# Patient Record
Sex: Female | Born: 1960
Health system: Southern US, Community
[De-identification: ages and names within clinical notes are randomized; demographics above are authoritative.]

## PROBLEM LIST (undated history)

## (undated) DIAGNOSIS — Z9289 Personal history of other medical treatment: Secondary | ICD-10-CM

## (undated) DIAGNOSIS — Z202 Contact with and (suspected) exposure to infections with a predominantly sexual mode of transmission: Secondary | ICD-10-CM

## (undated) DIAGNOSIS — J101 Influenza due to other identified influenza virus with other respiratory manifestations: Secondary | ICD-10-CM

## (undated) DIAGNOSIS — Z87442 Personal history of urinary calculi: Secondary | ICD-10-CM

## (undated) DIAGNOSIS — I10 Essential (primary) hypertension: Secondary | ICD-10-CM

## (undated) DIAGNOSIS — M25532 Pain in left wrist: Secondary | ICD-10-CM

## (undated) DIAGNOSIS — C801 Malignant (primary) neoplasm, unspecified: Secondary | ICD-10-CM

## (undated) DIAGNOSIS — F419 Anxiety disorder, unspecified: Secondary | ICD-10-CM

## (undated) DIAGNOSIS — F32A Depression, unspecified: Secondary | ICD-10-CM

## (undated) DIAGNOSIS — K219 Gastro-esophageal reflux disease without esophagitis: Secondary | ICD-10-CM

## (undated) DIAGNOSIS — G4730 Sleep apnea, unspecified: Secondary | ICD-10-CM

## (undated) DIAGNOSIS — F329 Major depressive disorder, single episode, unspecified: Secondary | ICD-10-CM

## (undated) DIAGNOSIS — R000 Tachycardia, unspecified: Secondary | ICD-10-CM

## (undated) DIAGNOSIS — R05 Cough: Secondary | ICD-10-CM

## (undated) DIAGNOSIS — R079 Chest pain, unspecified: Secondary | ICD-10-CM

## (undated) DIAGNOSIS — M797 Fibromyalgia: Secondary | ICD-10-CM

## (undated) DIAGNOSIS — I209 Angina pectoris, unspecified: Secondary | ICD-10-CM

## (undated) DIAGNOSIS — R51 Headache: Secondary | ICD-10-CM

## (undated) DIAGNOSIS — G8929 Other chronic pain: Secondary | ICD-10-CM

## (undated) HISTORY — DX: Anxiety disorder, unspecified: F41.9

## (undated) HISTORY — PX: TOTAL ABDOMINAL HYSTERECTOMY: SHX209

## (undated) HISTORY — PX: CHOLECYSTECTOMY: SHX55

## (undated) HISTORY — DX: Gastro-esophageal reflux disease without esophagitis: K21.9

## (undated) HISTORY — DX: Sleep apnea, unspecified: G47.30

## (undated) HISTORY — DX: Influenza due to other identified influenza virus with other respiratory manifestations: J10.1

## (undated) HISTORY — DX: Chest pain, unspecified: R07.9

## (undated) HISTORY — DX: Headache: R51

## (undated) HISTORY — DX: Other chronic pain: G89.29

## (undated) HISTORY — DX: Pain in left wrist: M25.532

## (undated) HISTORY — DX: Tachycardia, unspecified: R00.0

## (undated) HISTORY — DX: Cough: R05

## (undated) HISTORY — PX: WISDOM TOOTH EXTRACTION: SHX21

---
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
## 1998-10-02 ENCOUNTER — Emergency Department (HOSPITAL_COMMUNITY): Admission: EM | Admit: 1998-10-02 | Discharge: 1998-10-02 | Payer: Self-pay | Admitting: Emergency Medicine

## 1998-10-27 ENCOUNTER — Emergency Department (HOSPITAL_COMMUNITY): Admission: EM | Admit: 1998-10-27 | Discharge: 1998-10-27 | Payer: Self-pay | Admitting: Emergency Medicine

## 1999-04-08 ENCOUNTER — Emergency Department (HOSPITAL_COMMUNITY): Admission: EM | Admit: 1999-04-08 | Discharge: 1999-04-08 | Payer: Self-pay | Admitting: Emergency Medicine

## 1999-04-28 ENCOUNTER — Emergency Department (HOSPITAL_COMMUNITY): Admission: EM | Admit: 1999-04-28 | Discharge: 1999-04-28 | Payer: Self-pay | Admitting: Emergency Medicine

## 2000-02-11 ENCOUNTER — Emergency Department (HOSPITAL_COMMUNITY): Admission: EM | Admit: 2000-02-11 | Discharge: 2000-02-11 | Payer: Self-pay | Admitting: Emergency Medicine

## 2000-06-19 ENCOUNTER — Emergency Department (HOSPITAL_COMMUNITY): Admission: EM | Admit: 2000-06-19 | Discharge: 2000-06-19 | Payer: Self-pay | Admitting: Emergency Medicine

## 2000-08-08 ENCOUNTER — Emergency Department (HOSPITAL_COMMUNITY): Admission: EM | Admit: 2000-08-08 | Discharge: 2000-08-09 | Payer: Self-pay | Admitting: Emergency Medicine

## 2001-02-16 ENCOUNTER — Emergency Department (HOSPITAL_COMMUNITY): Admission: EM | Admit: 2001-02-16 | Discharge: 2001-02-16 | Payer: Self-pay | Admitting: *Deleted

## 2002-02-13 ENCOUNTER — Emergency Department (HOSPITAL_COMMUNITY): Admission: EM | Admit: 2002-02-13 | Discharge: 2002-02-13 | Payer: Self-pay | Admitting: Emergency Medicine

## 2002-04-17 ENCOUNTER — Emergency Department (HOSPITAL_COMMUNITY): Admission: EM | Admit: 2002-04-17 | Discharge: 2002-04-17 | Payer: Self-pay | Admitting: *Deleted

## 2002-04-27 ENCOUNTER — Emergency Department (HOSPITAL_COMMUNITY): Admission: EM | Admit: 2002-04-27 | Discharge: 2002-04-27 | Payer: Self-pay | Admitting: Emergency Medicine

## 2003-05-29 ENCOUNTER — Emergency Department (HOSPITAL_COMMUNITY): Admission: EM | Admit: 2003-05-29 | Discharge: 2003-05-30 | Payer: Self-pay | Admitting: Emergency Medicine

## 2003-12-07 ENCOUNTER — Emergency Department (HOSPITAL_COMMUNITY): Admission: EM | Admit: 2003-12-07 | Discharge: 2003-12-07 | Payer: Self-pay | Admitting: Emergency Medicine

## 2003-12-10 ENCOUNTER — Emergency Department (HOSPITAL_COMMUNITY): Admission: EM | Admit: 2003-12-10 | Discharge: 2003-12-10 | Payer: Self-pay | Admitting: Family Medicine

## 2004-06-03 ENCOUNTER — Emergency Department (HOSPITAL_COMMUNITY): Admission: EM | Admit: 2004-06-03 | Discharge: 2004-06-04 | Payer: Self-pay | Admitting: Emergency Medicine

## 2004-06-08 ENCOUNTER — Ambulatory Visit (HOSPITAL_BASED_OUTPATIENT_CLINIC_OR_DEPARTMENT_OTHER): Admission: RE | Admit: 2004-06-08 | Discharge: 2004-06-08 | Payer: Self-pay | Admitting: Internal Medicine

## 2004-06-10 ENCOUNTER — Ambulatory Visit: Payer: Self-pay | Admitting: Internal Medicine

## 2004-06-11 ENCOUNTER — Ambulatory Visit: Payer: Self-pay

## 2004-06-17 ENCOUNTER — Ambulatory Visit: Payer: Self-pay | Admitting: Pulmonary Disease

## 2004-06-18 ENCOUNTER — Ambulatory Visit: Payer: Self-pay | Admitting: Internal Medicine

## 2004-06-19 ENCOUNTER — Ambulatory Visit: Payer: Self-pay | Admitting: Internal Medicine

## 2004-07-29 ENCOUNTER — Ambulatory Visit: Payer: Self-pay | Admitting: Pulmonary Disease

## 2004-08-03 ENCOUNTER — Encounter: Admission: RE | Admit: 2004-08-03 | Discharge: 2004-08-03 | Payer: Self-pay | Admitting: Internal Medicine

## 2004-08-22 ENCOUNTER — Ambulatory Visit: Payer: Self-pay | Admitting: Internal Medicine

## 2004-08-26 ENCOUNTER — Ambulatory Visit: Payer: Self-pay | Admitting: Internal Medicine

## 2004-08-27 ENCOUNTER — Ambulatory Visit: Payer: Self-pay | Admitting: Internal Medicine

## 2004-08-28 ENCOUNTER — Encounter: Admission: RE | Admit: 2004-08-28 | Discharge: 2004-08-28 | Payer: Self-pay | Admitting: Internal Medicine

## 2004-09-02 ENCOUNTER — Ambulatory Visit (HOSPITAL_BASED_OUTPATIENT_CLINIC_OR_DEPARTMENT_OTHER): Admission: RE | Admit: 2004-09-02 | Discharge: 2004-09-02 | Payer: Self-pay | Admitting: Internal Medicine

## 2004-09-07 ENCOUNTER — Ambulatory Visit: Payer: Self-pay | Admitting: Internal Medicine

## 2004-09-15 ENCOUNTER — Ambulatory Visit: Payer: Self-pay | Admitting: Internal Medicine

## 2004-10-10 ENCOUNTER — Ambulatory Visit: Payer: Self-pay | Admitting: Internal Medicine

## 2004-12-22 ENCOUNTER — Ambulatory Visit: Payer: Self-pay | Admitting: Internal Medicine

## 2005-01-08 ENCOUNTER — Ambulatory Visit (HOSPITAL_COMMUNITY): Admission: RE | Admit: 2005-01-08 | Discharge: 2005-01-08 | Payer: Self-pay | Admitting: Obstetrics & Gynecology

## 2005-01-19 ENCOUNTER — Encounter: Admission: RE | Admit: 2005-01-19 | Discharge: 2005-01-19 | Payer: Self-pay | Admitting: Obstetrics & Gynecology

## 2005-03-02 ENCOUNTER — Emergency Department (HOSPITAL_COMMUNITY): Admission: EM | Admit: 2005-03-02 | Discharge: 2005-03-02 | Payer: Self-pay | Admitting: Emergency Medicine

## 2005-05-07 ENCOUNTER — Ambulatory Visit: Payer: Self-pay | Admitting: Internal Medicine

## 2005-05-08 ENCOUNTER — Encounter: Admission: RE | Admit: 2005-05-08 | Discharge: 2005-05-08 | Payer: Self-pay | Admitting: Internal Medicine

## 2005-05-20 ENCOUNTER — Ambulatory Visit: Payer: Self-pay | Admitting: Internal Medicine

## 2005-05-25 ENCOUNTER — Ambulatory Visit: Payer: Self-pay | Admitting: Internal Medicine

## 2005-06-02 ENCOUNTER — Encounter (INDEPENDENT_AMBULATORY_CARE_PROVIDER_SITE_OTHER): Payer: Self-pay | Admitting: *Deleted

## 2005-06-02 ENCOUNTER — Ambulatory Visit (HOSPITAL_COMMUNITY): Admission: RE | Admit: 2005-06-02 | Discharge: 2005-06-03 | Payer: Self-pay | Admitting: General Surgery

## 2005-07-09 ENCOUNTER — Ambulatory Visit: Payer: Self-pay | Admitting: Internal Medicine

## 2005-08-31 ENCOUNTER — Ambulatory Visit: Payer: Self-pay | Admitting: Internal Medicine

## 2005-11-05 ENCOUNTER — Ambulatory Visit: Payer: Self-pay | Admitting: Internal Medicine

## 2006-02-03 ENCOUNTER — Ambulatory Visit: Payer: Self-pay | Admitting: Internal Medicine

## 2006-03-28 ENCOUNTER — Emergency Department (HOSPITAL_COMMUNITY): Admission: EM | Admit: 2006-03-28 | Discharge: 2006-03-28 | Payer: Self-pay | Admitting: Emergency Medicine

## 2006-04-19 ENCOUNTER — Emergency Department (HOSPITAL_COMMUNITY): Admission: EM | Admit: 2006-04-19 | Discharge: 2006-04-19 | Payer: Self-pay | Admitting: Family Medicine

## 2006-05-29 ENCOUNTER — Emergency Department (HOSPITAL_COMMUNITY): Admission: EM | Admit: 2006-05-29 | Discharge: 2006-05-29 | Payer: Self-pay | Admitting: Emergency Medicine

## 2006-07-29 ENCOUNTER — Emergency Department (HOSPITAL_COMMUNITY): Admission: EM | Admit: 2006-07-29 | Discharge: 2006-07-29 | Payer: Self-pay | Admitting: Family Medicine

## 2006-11-09 ENCOUNTER — Ambulatory Visit (HOSPITAL_COMMUNITY): Admission: RE | Admit: 2006-11-09 | Discharge: 2006-11-09 | Payer: Self-pay | Admitting: Obstetrics & Gynecology

## 2006-11-18 ENCOUNTER — Emergency Department (HOSPITAL_COMMUNITY): Admission: EM | Admit: 2006-11-18 | Discharge: 2006-11-18 | Payer: Self-pay | Admitting: Emergency Medicine

## 2006-12-17 ENCOUNTER — Ambulatory Visit (HOSPITAL_COMMUNITY): Admission: RE | Admit: 2006-12-17 | Discharge: 2006-12-17 | Payer: Self-pay | Admitting: Obstetrics & Gynecology

## 2006-12-17 ENCOUNTER — Encounter: Payer: Self-pay | Admitting: Obstetrics & Gynecology

## 2006-12-19 ENCOUNTER — Inpatient Hospital Stay (HOSPITAL_COMMUNITY): Admission: AD | Admit: 2006-12-19 | Discharge: 2006-12-19 | Payer: Self-pay | Admitting: Obstetrics

## 2006-12-20 ENCOUNTER — Inpatient Hospital Stay (HOSPITAL_COMMUNITY): Admission: AD | Admit: 2006-12-20 | Discharge: 2006-12-20 | Payer: Self-pay | Admitting: Obstetrics & Gynecology

## 2007-01-02 ENCOUNTER — Emergency Department (HOSPITAL_COMMUNITY): Admission: EM | Admit: 2007-01-02 | Discharge: 2007-01-02 | Payer: Self-pay | Admitting: Emergency Medicine

## 2007-01-04 ENCOUNTER — Emergency Department (HOSPITAL_COMMUNITY): Admission: EM | Admit: 2007-01-04 | Discharge: 2007-01-04 | Payer: Self-pay | Admitting: Emergency Medicine

## 2007-01-05 DIAGNOSIS — K219 Gastro-esophageal reflux disease without esophagitis: Secondary | ICD-10-CM | POA: Insufficient documentation

## 2007-03-12 ENCOUNTER — Emergency Department (HOSPITAL_COMMUNITY): Admission: EM | Admit: 2007-03-12 | Discharge: 2007-03-12 | Payer: Self-pay | Admitting: Emergency Medicine

## 2007-05-07 ENCOUNTER — Emergency Department (HOSPITAL_COMMUNITY): Admission: EM | Admit: 2007-05-07 | Discharge: 2007-05-07 | Payer: Self-pay | Admitting: Emergency Medicine

## 2007-10-23 ENCOUNTER — Inpatient Hospital Stay (HOSPITAL_COMMUNITY): Admission: AD | Admit: 2007-10-23 | Discharge: 2007-10-24 | Payer: Self-pay | Admitting: Obstetrics and Gynecology

## 2007-11-01 ENCOUNTER — Emergency Department (HOSPITAL_COMMUNITY): Admission: EM | Admit: 2007-11-01 | Discharge: 2007-11-01 | Payer: Self-pay | Admitting: Emergency Medicine

## 2008-02-22 ENCOUNTER — Emergency Department (HOSPITAL_COMMUNITY): Admission: EM | Admit: 2008-02-22 | Discharge: 2008-02-22 | Payer: Self-pay | Admitting: Emergency Medicine

## 2008-04-04 ENCOUNTER — Emergency Department (HOSPITAL_COMMUNITY): Admission: EM | Admit: 2008-04-04 | Discharge: 2008-04-04 | Payer: Self-pay | Admitting: Emergency Medicine

## 2008-05-25 ENCOUNTER — Emergency Department (HOSPITAL_COMMUNITY): Admission: EM | Admit: 2008-05-25 | Discharge: 2008-05-25 | Payer: Self-pay | Admitting: Emergency Medicine

## 2008-12-09 ENCOUNTER — Emergency Department (HOSPITAL_COMMUNITY): Admission: EM | Admit: 2008-12-09 | Discharge: 2008-12-10 | Payer: Self-pay | Admitting: Emergency Medicine

## 2009-01-06 ENCOUNTER — Emergency Department (HOSPITAL_COMMUNITY): Admission: EM | Admit: 2009-01-06 | Discharge: 2009-01-06 | Payer: Self-pay | Admitting: Emergency Medicine

## 2009-01-08 ENCOUNTER — Emergency Department (HOSPITAL_COMMUNITY): Admission: EM | Admit: 2009-01-08 | Discharge: 2009-01-08 | Payer: Self-pay | Admitting: Emergency Medicine

## 2009-03-04 ENCOUNTER — Emergency Department (HOSPITAL_COMMUNITY): Admission: EM | Admit: 2009-03-04 | Discharge: 2009-03-04 | Payer: Self-pay | Admitting: Emergency Medicine

## 2009-03-18 ENCOUNTER — Emergency Department (HOSPITAL_COMMUNITY): Admission: EM | Admit: 2009-03-18 | Discharge: 2009-03-18 | Payer: Self-pay | Admitting: Emergency Medicine

## 2009-05-09 ENCOUNTER — Emergency Department (HOSPITAL_COMMUNITY): Admission: EM | Admit: 2009-05-09 | Discharge: 2009-05-10 | Payer: Self-pay | Admitting: Emergency Medicine

## 2009-05-13 IMAGING — CR DG TOE 5TH 2+V*L*
3 series · 3 of 3 positions shown · non-contrast
Comparison: None

CLINICAL DATA: Broken toe

LEFT TOE - 2+ VIEW

[t toes ap left]
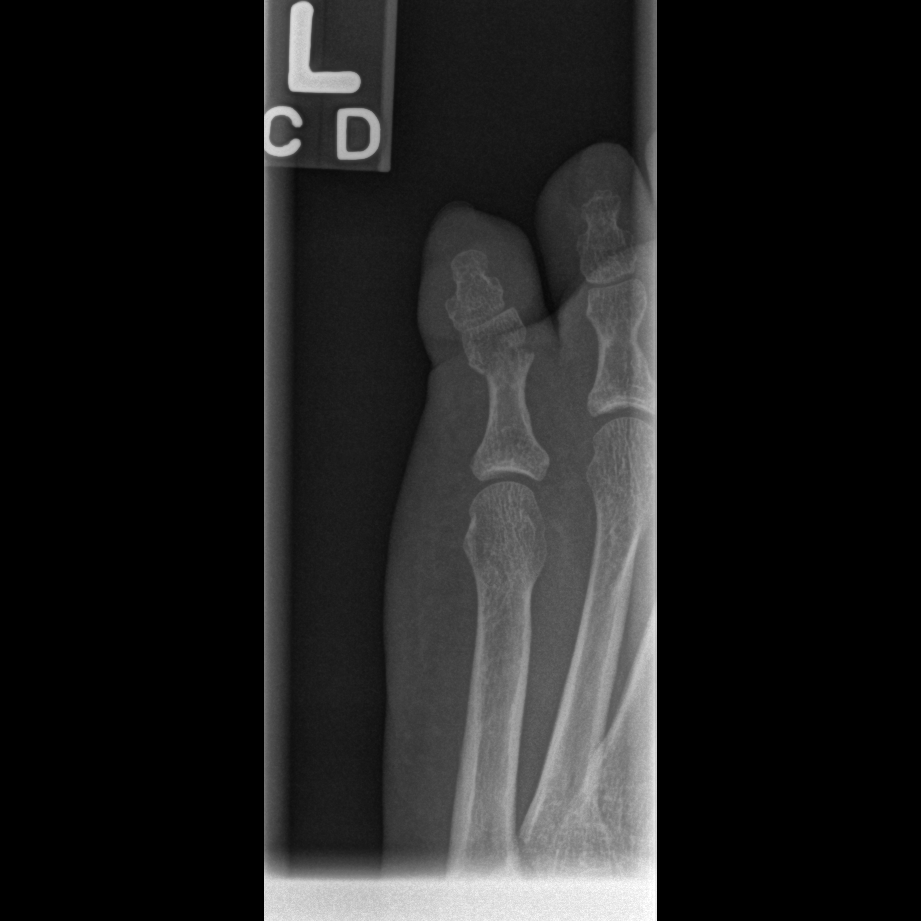

[t toes oblique left]
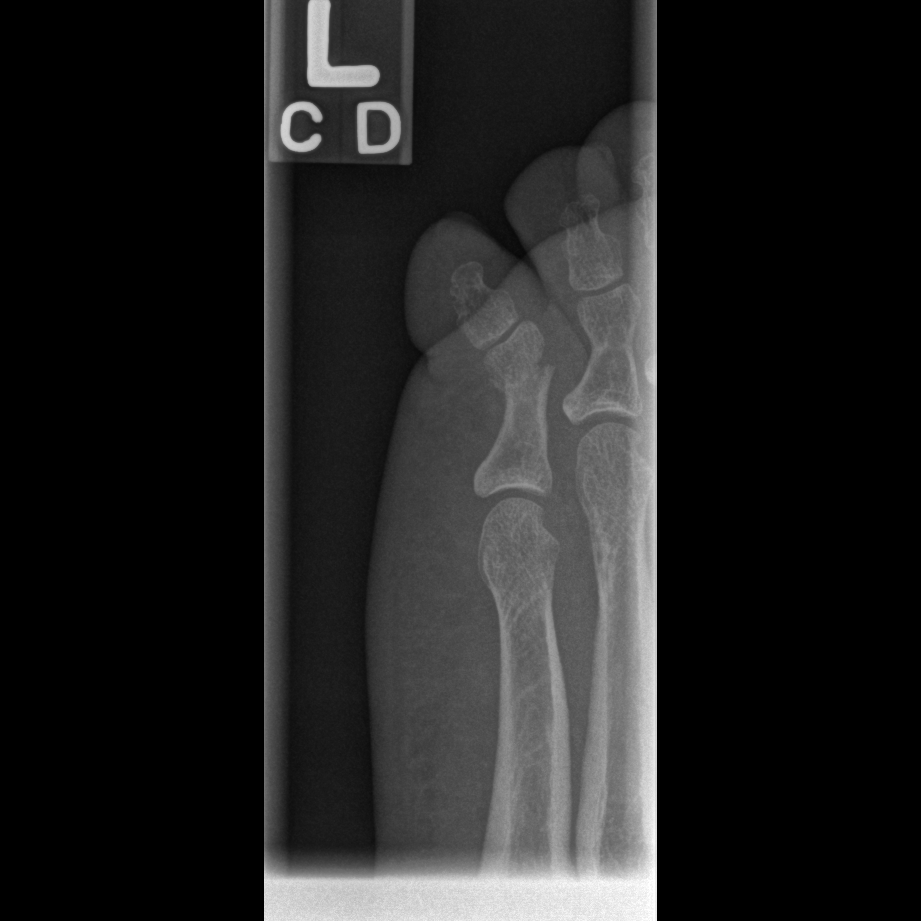

[t toes lateral left]
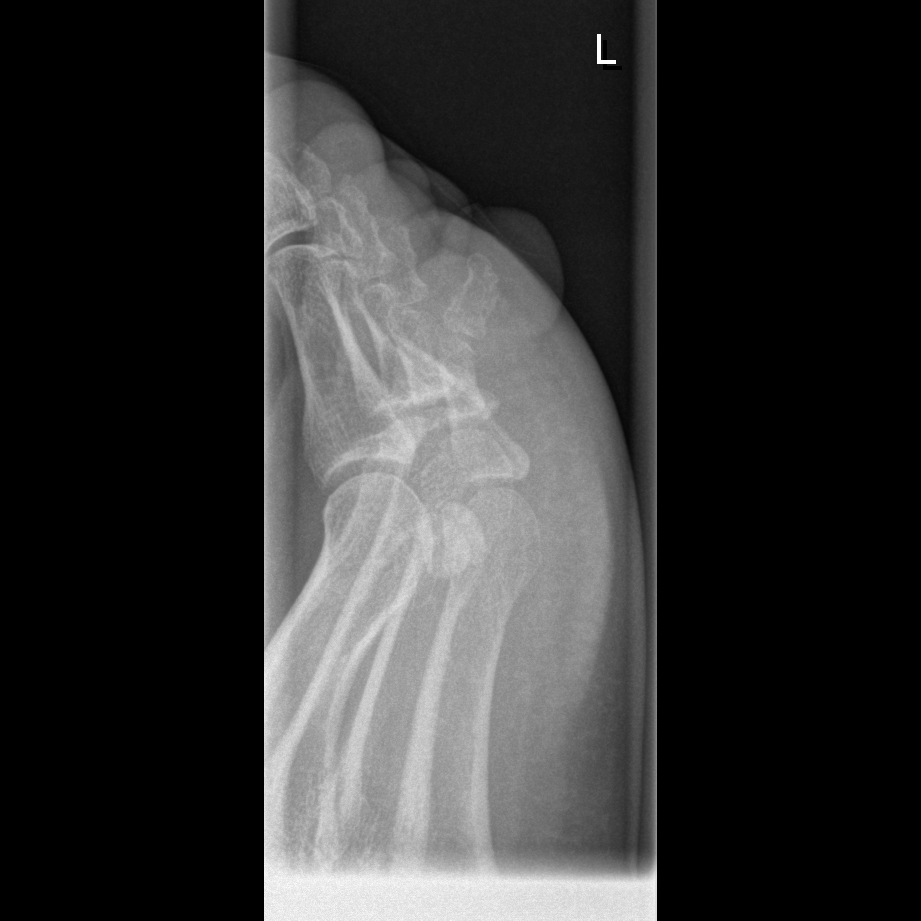

[3 of 3 positions shown; findings below may reference images not displayed]

FINDINGS: There is a transverse fracture through the distal aspect
of the the shaft of the fifth proximal phalanx.

There is lateral angulation of the distal fracture fragment.
IMPRESSION: 1.  Transverse fracture through the distal diaphysis of the fifth
proximal phalanx

## 2009-08-25 ENCOUNTER — Emergency Department (HOSPITAL_COMMUNITY): Admission: EM | Admit: 2009-08-25 | Discharge: 2009-08-25 | Payer: Self-pay | Admitting: Emergency Medicine

## 2010-01-19 ENCOUNTER — Emergency Department (HOSPITAL_COMMUNITY): Admission: EM | Admit: 2010-01-19 | Discharge: 2010-01-19 | Payer: Self-pay | Admitting: Emergency Medicine

## 2010-06-21 ENCOUNTER — Emergency Department (HOSPITAL_COMMUNITY)
Admission: EM | Admit: 2010-06-21 | Discharge: 2010-06-21 | Payer: Self-pay | Source: Home / Self Care | Admitting: Emergency Medicine

## 2010-06-28 ENCOUNTER — Encounter: Payer: Self-pay | Admitting: Internal Medicine

## 2010-06-29 ENCOUNTER — Encounter: Payer: Self-pay | Admitting: Obstetrics & Gynecology

## 2010-09-12 LAB — URINE CULTURE

## 2010-09-12 LAB — URINALYSIS, ROUTINE W REFLEX MICROSCOPIC
Glucose, UA: NEGATIVE mg/dL
Protein, ur: 30 mg/dL — AB

## 2010-09-12 LAB — URINE MICROSCOPIC-ADD ON

## 2010-09-14 LAB — DIFFERENTIAL
Basophils Absolute: 0 10*3/uL (ref 0.0–0.1)
Lymphocytes Relative: 37 % (ref 12–46)
Lymphs Abs: 2.6 10*3/uL (ref 0.7–4.0)
Neutro Abs: 3.4 10*3/uL (ref 1.7–7.7)
Neutrophils Relative %: 48 % (ref 43–77)

## 2010-09-14 LAB — LIPASE, BLOOD: Lipase: 17 U/L (ref 11–59)

## 2010-09-14 LAB — URINALYSIS, ROUTINE W REFLEX MICROSCOPIC
Glucose, UA: NEGATIVE mg/dL
Hgb urine dipstick: NEGATIVE
Protein, ur: NEGATIVE mg/dL
Specific Gravity, Urine: 1.041 — ABNORMAL HIGH (ref 1.005–1.030)

## 2010-09-14 LAB — COMPREHENSIVE METABOLIC PANEL
BUN: 8 mg/dL (ref 6–23)
CO2: 28 mEq/L (ref 19–32)
Chloride: 101 mEq/L (ref 96–112)
Creatinine, Ser: 0.85 mg/dL (ref 0.4–1.2)
GFR calc non Af Amer: >60 mL/min (ref >60)
Total Bilirubin: 0.7 mg/dL (ref 0.3–1.2)

## 2010-09-14 LAB — CBC
HCT: 36.4 % (ref 36.0–46.0)
MCV: 92.4 fL (ref 78.0–100.0)
RBC: 3.94 MIL/uL (ref 3.87–5.11)
WBC: 7.1 10*3/uL (ref 4.0–10.5)

## 2010-10-21 NOTE — H&P (Signed)
NAME:  Sherry [name], Sherry [name] NO.:  [id number]   MEDICAL RECORD NO.:  [id number]          PATIENT TYPE:  AMB   LOCATION:  SDC                           FACILITY:  [facility]   PHYSICIAN:  Roseanna Rainbow, M.D.DATE OF BIRTH:  1960-11-17   DATE OF ADMISSION:  DATE OF DISCHARGE:                              HISTORY & PHYSICAL   PREADMISSION HISTORY AND PHYSICAL   CHIEF COMPLAINT:  The patient is a 50 year old with vulvar cysts, who  presents for excision of the cysts.   HISTORY OF PRESENT ILLNESS:  Please see the above.   SOCIAL HISTORY:  Single, denies illicit drug use, has no significant  smoking history.   FAMILY HISTORY:  Hypertension.   PAST MEDICAL HISTORY:  GERD.   PAST SURGICAL HISTORY:  Total hysterectomy in 1986.   MEDICATIONS:  Please see the Reconciliation Form.   ALLERGIES:  No known drug allergies.   PAST GYNECOLOGIC HISTORY:  Please see the above.   REVIEW OF SYSTEMS:  GU:  Please see the above.   PHYSICAL EXAMINATION:  VITAL SIGNS:  Temperature 98.9, pulse 98, blood  pressure 132/79, weight 173 pounds.  GENERAL:  Well-developed, well-nourished female in no apparent distress.  LUNGS:  Clear to auscultation bilaterally.  HEART:  Regular rate and rhythm.  ABDOMEN:  Soft and nontender without masses.  PELVIC EXAM:  The labia majora have multiple subcutaneous cysts  approximately 1 cm in diameter.  SPECULUM EXAM:  The cervix is absent.  The vaginal mucosa is clear  without lesions, no discharge.  BIMANUAL EXAM:  There are no masses or organomegaly.   ASSESSMENT:  Vulvar cysts, likely sebaceous cysts.   PLAN:  Excision of vulvar cysts.  The risks, benefits, and alternative  forms of management were reviewed with the patient, and informed consent  had been obtained.      Roseanna Rainbow, M.D.  Electronically Signed     LAJ/MEDQ  D:  11/18/2006  T:  11/18/2006  Job:  [id number]

## 2010-10-21 NOTE — Op Note (Signed)
NAMEMCKAYLA, Sherry [name]            ACCOUNT NO.:  [id number]   MEDICAL RECORD NO.:  [id number]          PATIENT TYPE:  AMB   LOCATION:  SDC                           FACILITY:  [facility]   PHYSICIAN:  Roseanna Rainbow, M.D.DATE OF BIRTH:  December 26, 1960   DATE OF PROCEDURE:  12/17/2006  DATE OF DISCHARGE:                               OPERATIVE REPORT   PREOPERATIVE DIAGNOSIS:  Vulvar cyst, likely sebaceous cyst.   POSTOPERATIVE DIAGNOSIS:  Vulvar cyst, likely sebaceous cyst.   PROCEDURE:  Excision of vulvar cyst.   SURGEON:  Roseanna Rainbow, M.D.   ANESTHESIA:  Spinal.   ESTIMATED BLOOD LOSS:  Minimal.   COMPLICATIONS:  None.   PROCEDURE IN DETAIL:  The patient was taken to the operating room with  an IV running.  A spinal anesthetic was administered without difficulty.  She was placed in the dorsal lithotomy position and prepped and draped  in the usual sterile fashion.  There were multiple cysts grouped  together on the right with labia major.  These were excised en mass.  The defect was closed in layers using interrupted sutures of 3-0 Vicryl  followed by a subcuticular stitch to reapproximate the skin.  There were  two 1-2 cm cysts noted on the left labia major.  These were excised and  the defect again closed in layers using 3-0 Vicryl.  The skin again was  reapproximated in a subcuticular fashion using 3-0 Vicryl.  The  incisions were then irrigated.  Adequate hemostasis was noted.  At the  close of the procedure, the instrument and pack counts were said to be  correct x2.  The patient was taken to the PACU awake and in stable  condition.      Roseanna Rainbow, M.D.  Electronically Signed     LAJ/MEDQ  D:  12/17/2006  T:  12/18/2006  Job:  [id number]

## 2010-10-24 NOTE — Op Note (Signed)
[name], [name] NO.:  [id number]   MEDICAL RECORD NO.:  [id number]          PATIENT TYPE:  OIB   LOCATION:  1619                         FACILITY:  [facility]   PHYSICIAN:  Anselm Pancoast. Weatherly, M.D.DATE OF BIRTH:  1960/06/09   DATE OF PROCEDURE:  06/02/2005  DATE OF DISCHARGE:                                 OPERATIVE REPORT   PREOPERATIVE DIAGNOSIS:  Chronic cholecystitis and stones.   POSTOPERATIVE DIAGNOSIS:  Chronic cholecystitis and stones.   OPERATIONS:  Laparoscopic cholecystectomy with cholangiogram.   ANESTHESIA:  General.   SURGEON:  Dr. Consuello Bossier   ASSISTANT:  Nurse.   HISTORY:  Sherry [name] is a 50 year old female referred to [place] by Sherry [name] on the 22nd after she had had episodes of epigastric pain.  She had  seen Dr. Oliver [name].  He referred her to Dr. Marina [name].  They did an ultrasound  on the 18th which showed multiple gallstones and no acute wall thickening or  dilated common bile duct.  She works with [employer], was seen on the 21st and was  extremely nervous, not really having acute symptoms at that time but having  frequent recurring attacks.  I recommended we proceed on with an urgent  laparoscopic cholecystectomy right after Christmas, and she was in  agreement.  She said she has had some discomfort over the last couple days  but not had any fever, and her liver function studies were normal.  The  patient preoperatively was given 3 g of Unasyn, has PAS stockings, and taken  to the operative suite.  Induction of general anesthesia endotracheal tube,  oral tube into the stomach.  The abdomen was prepped with Betadine surgical  scrub and solution and draped in a sterile manner.  She had had a previous  laparoscopic procedure at the umbilicus, and we made a little incision  vertically; underlying fascia was identified, this picked up between 2  Kochers and then very carefully entered into the peritoneal cavity.  Fortunately, she did  not have any significant adhesions at the umbilicus,  and the Hassan cannula was introduced after a pursestring suture of 0 Vicryl  was placed.  She did have a lot of scar over the liver; these were carefully  taken down after the upper 10 mL trocar was placed and the 2 lateral 5 mm  trocars.  The gallbladder was very swollen but not acutely inflamed.  In the  proximal gallbladder, you could see the stones within it, kind of  obstructing at the junction of the cystic duct and gallbladder junction.  The peritoneum was kind of carefully opened up.  The anterior branch of the  cystic artery was separated so I could encompass with right angle, and 2  clips were placed singly, distally, and then divided, and this allowed me to  then go around the distal common cystic duct to separate it from the  surrounding adipose and areolar tissue.  I placed a clip flush with the  gallbladder junction of the cystic duct and then opened the little cystic  duct proximally.  Cholangiocatheter was inserted; x-ray was obtained  which  showed good prompt fill of the extrahepatic biliary system and good flow  into the duodenum.  The cystic duct was about 2 cm in length.  I dissected  down probably about another centimeter and then put 3 clips after the  catheter had been removed, divided the cystic duct.  Next a posterior branch  of the cystic artery was dissected out, doubly clipped proximally, and then  divided, and then the gallbladder was freed from its bed.  I placed the  gallbladder within an EndoCatch bag, switched the camera to the upper 10 mm  port, and withdrew the bag containing the gallbladder.  There were numerous  small stones within it.  Reinspection, good hemostasis, no bile, and the 5  mm ports withdrawn  under direct vision.  I placed an additional figure-of-eight suture of 0  Vicryl at the fascia at the umbilicus and then anesthetized the fascia at  the umbilicus.  The subcutaneous tissue was  closed with 4-0 Vicryl, Benzoin  and Steri-Strips on the skin.           ______________________________  Anselm Pancoast. Zachery Dakins, M.D.     WJW/MEDQ  D:  06/02/2005  T:  06/02/2005  Job:  [id number]   cc:   Wilhemina Bonito. Sherry [name], M.D. [facility]  [address]  [place]  [place] [address]

## 2010-10-24 NOTE — Assessment & Plan Note (Signed)
[facility]                                   ON-CALL NOTE   MAKAYLYN, SINYARD                     MRN:          [id number]  DATE:03/28/2006                            DOB:          [birth year]    PHONE NUMBER:  [phone].   SUBJECTIVE:  Ms. Jorgenson daughter, Blake Divine, is calling regarding her  mother's vomiting, headache and weakness.  This has been going on starting  yesterday.  Her mother has a history of migraines and a nervous problem.  She denies abdominal pain, fever, chills, diarrhea, vomiting blood,  dizziness.  She is urinating.   ASSESSMENT/PLAN:  Vomiting secondary to migraine versus viral  gastroenteritis.  I encouraged fluids.  She may use ibuprofen 600 mg p.o.  q.6h. p.r.n. headache as well as her migraine and nerve medication that she  has been prescribed by Dr. Jonny Ruiz.  If she does not feel any better, she may  be seen in our urgent care clinic today or should call on Monday for any  appointment with Dr. Jonny Ruiz.       Kerby Nora, MD      AB/MedQ  DD:  03/28/2006  DT:  03/29/2006  Job #:  [id number]   cc:   Corwin Levins, MD

## 2010-10-24 NOTE — Procedures (Signed)
NAME:  Sherry [name], Sherry [name] NO.:  [id number]   MEDICAL RECORD NO.:  [id number]          PATIENT TYPE:  OUT   LOCATION:  SLEEP CENTER                 FACILITY:  [facility]   PHYSICIAN:  Clinton D. Maple Hudson, M.D. DATE OF BIRTH:  1961/01/14   DATE OF STUDY:  09/02/2004                              NOCTURNAL POLYSOMNOGRAM   REFERRING PHYSICIAN:  Clinton D. Maple Hudson, M.D.   INDICATIONS FOR STUDY:  Hypersomnia with sleep apnea. Epworth sleepiness  score 10/24, BMI 34, weight 274 pounds. A diagnostic NPSG of June 17, 2004, reported an RDI of 24 per hour. CPAP titration is requested.   SLEEP ARCHITECTURE:  Short total sleep time 152 minutes with sleep  efficiency of 42%. Stage I was 6%, stage II was 65%, stages III and IV were  8%, REM was 22% of total sleep time. Latent to sleep onset 147 minutes.  Latency to REM was 117 minutes. Awake after sleep onset 16 minutes. Arousal  index 19. Sleep onset was at 1:11 a.m., although she had taken Sonata 10 mg  before arrival. She took a second Sonata at 12:38 a.m. The patient requested  that TV be left on. She woke at her habitual wake up around 4:45 a.m.   RESPIRATORY DATA:  CPAP titration protocol. CPAP was titrated to 11 CWP, RDI  of 0 per hour using a Nasal-PAP Freestyle, size medium-small pillows with  heated humidifier and chin strap.   OXYGEN DATA:  Snoring was prevented. Oxygen saturation was held at 95% to  98% on room air with CPAP.   CARDIAC DATA:  Normal sinus rhythm.   MOVEMENT/PARASOMNIA:  Occasional leg jerk with insignificant effect on  sleep. Complaint of waking with stiff painful legs in the morning. She says  it is difficult.   IMPRESSION/RECOMMENDATIONS:  1.  Successful CPAP titration to 11 CWP, RDI 0 per hour using a Nasal-PAP      Freestyle with medium-small nasal pillows, heated humidifier chin strap.  2.  Baseline diagnostic NPSG on June 17, 2004, recorded an RDI of 24 per      hour.  3.  Marked difficulty  initiating and maintaining sleep despite Sonata 10 mg      taken at 8 p.m. and again at 12:38 a.m.  4.  Note complaint of waking with morning stiffness and leg pain as typical      for her.    CDY/MEDQ  D:  09/07/2004 11:27:26  T:  09/07/2004 19:44:39  Job:  [id number]

## 2010-10-24 NOTE — Letter (Signed)
February 26, 2006     Sherry [name]  [address]  [place], [place] [address]   RE:  HYDIE, LANGAN  MRN:  [id number]  /  DOB:  January 06, 1961   Dear Ms. Behrmann,   We find it necessary to inform you that we will no longer be able to provide  medical care to you because of numerous missed appointments on referral.  You have been referred to multiple physicians with either rescheduling or  not showing for the appointment.  This is clearly noncompliance with  recommendations from your last visit February 03, 2006.  Specifically, these  no-show appointments involve Dr. Althea Charon, orthopedic physician; Dr.  Leeanne Deed, podiatry; Dr. Maple Hudson, [facility].  These physicians and  health professionals have informed us they will no longer accept referrals  because of the multiple rescheduling or no shows.  Due to this situation, we  find we are unable to continue in the continued physician-patient  relationship.   Since your condition requires continued medical attention, we suggest that  you place yourself under the care of another physician without delay.  If  you desire, we will be available for emergency care for 30 days after you  receive this letter.  This should give you ample time to select a physician  of your choice from the many competent providers in this area.  You may want  to call the local medical society or [facility] Referral  Service for their assistance in locating a new physician.  With your written  authorization, we will make a copy of your medical records available to your  new physician.    Sincerely,      Corwin Levins, MD   JWJ/MedQ  DD:  02/26/2006  DT:  03/01/2006  Job #:  [id number]

## 2010-10-24 NOTE — Assessment & Plan Note (Signed)
[facility]                                   ON-CALL NOTE   Sherry [name], Sherry [name]                     MRN:          [id number]  DATE:03/28/2006                            DOB:          [birth year]    ADDENDUM   SUBJECTIVE:  The patient called back stating that her migraine has continued  and she was unable to keep ibuprofen down.   ASSESSMENT AND PLAN:  Migraine.  I recommended for her to go to [facility]  urgent care for consideration of Phenergan and pain medicine injection.  The  patient understood.       Kerby Nora, MD      AB/MedQ  DD:  03/28/2006  DT:  03/29/2006  Job #:  [id number]   cc:   Corwin Levins, MD

## 2010-12-12 ENCOUNTER — Emergency Department (HOSPITAL_COMMUNITY)
Admission: EM | Admit: 2010-12-12 | Discharge: 2010-12-12 | Disposition: A | Discharge status: Home / Self Care | Payer: Self-pay | Attending: Emergency Medicine | Admitting: Emergency Medicine

## 2010-12-12 DIAGNOSIS — R5383 Other fatigue: Secondary | ICD-10-CM | POA: Insufficient documentation

## 2010-12-12 DIAGNOSIS — G43909 Migraine, unspecified, not intractable, without status migrainosus: Secondary | ICD-10-CM | POA: Insufficient documentation

## 2010-12-12 DIAGNOSIS — IMO0001 Reserved for inherently not codable concepts without codable children: Secondary | ICD-10-CM | POA: Insufficient documentation

## 2010-12-12 DIAGNOSIS — F411 Generalized anxiety disorder: Secondary | ICD-10-CM | POA: Insufficient documentation

## 2010-12-12 DIAGNOSIS — R5381 Other malaise: Secondary | ICD-10-CM | POA: Insufficient documentation

## 2010-12-12 DIAGNOSIS — K219 Gastro-esophageal reflux disease without esophagitis: Secondary | ICD-10-CM | POA: Insufficient documentation

## 2010-12-12 DIAGNOSIS — H53149 Visual discomfort, unspecified: Secondary | ICD-10-CM | POA: Insufficient documentation

## 2010-12-12 DIAGNOSIS — R112 Nausea with vomiting, unspecified: Secondary | ICD-10-CM | POA: Insufficient documentation

## 2011-03-04 LAB — URINALYSIS, ROUTINE W REFLEX MICROSCOPIC
Bilirubin Urine: NEGATIVE
Bilirubin Urine: NEGATIVE
Ketones, ur: NEGATIVE
Nitrite: NEGATIVE
Nitrite: NEGATIVE
Specific Gravity, Urine: 1.01
Specific Gravity, Urine: 1.01
Urobilinogen, UA: 1
Urobilinogen, UA: 0.2

## 2011-03-04 LAB — WET PREP, GENITAL

## 2011-03-04 LAB — URINE MICROSCOPIC-ADD ON

## 2011-03-04 LAB — GC/CHLAMYDIA PROBE AMP, GENITAL: Chlamydia, DNA Probe: NEGATIVE

## 2011-03-04 LAB — URINE CULTURE

## 2011-03-09 LAB — COMPREHENSIVE METABOLIC PANEL
Albumin: 4.2
BUN: 9
Creatinine, Ser: 0.77
Total Bilirubin: 0.8
Total Protein: 8.2

## 2011-03-09 LAB — DIFFERENTIAL
Basophils Absolute: 0
Lymphocytes Relative: 24
Monocytes Absolute: 0.6
Monocytes Relative: 7
Neutro Abs: 6.2

## 2011-03-09 LAB — CBC
HCT: 41.4
MCHC: 32.9
MCV: 91.7
Platelets: 422 — ABNORMAL HIGH
RDW: 14.5

## 2011-03-09 LAB — URINALYSIS, ROUTINE W REFLEX MICROSCOPIC
Glucose, UA: NEGATIVE
Leukocytes, UA: NEGATIVE
pH: 6

## 2011-03-09 LAB — URINE CULTURE

## 2011-03-09 LAB — URINE MICROSCOPIC-ADD ON

## 2011-03-11 ENCOUNTER — Emergency Department (HOSPITAL_COMMUNITY)
Admission: EM | Admit: 2011-03-11 | Discharge: 2011-03-11 | Disposition: A | Discharge status: Home / Self Care | Payer: Self-pay | Attending: Emergency Medicine | Admitting: Emergency Medicine

## 2011-03-11 DIAGNOSIS — R110 Nausea: Secondary | ICD-10-CM | POA: Insufficient documentation

## 2011-03-11 DIAGNOSIS — M79609 Pain in unspecified limb: Secondary | ICD-10-CM | POA: Insufficient documentation

## 2011-03-11 DIAGNOSIS — IMO0001 Reserved for inherently not codable concepts without codable children: Secondary | ICD-10-CM | POA: Insufficient documentation

## 2011-03-11 DIAGNOSIS — R51 Headache: Secondary | ICD-10-CM | POA: Insufficient documentation

## 2011-03-19 ENCOUNTER — Encounter: Payer: Self-pay | Admitting: Family Medicine

## 2011-03-19 ENCOUNTER — Other Ambulatory Visit: Payer: Self-pay | Admitting: Family Medicine

## 2011-03-23 LAB — WOUND CULTURE

## 2011-03-24 ENCOUNTER — Ambulatory Visit (INDEPENDENT_AMBULATORY_CARE_PROVIDER_SITE_OTHER): Payer: Self-pay | Admitting: Family Medicine

## 2011-03-24 ENCOUNTER — Encounter: Payer: Self-pay | Admitting: Family Medicine

## 2011-03-24 DIAGNOSIS — IMO0001 Reserved for inherently not codable concepts without codable children: Secondary | ICD-10-CM

## 2011-03-24 DIAGNOSIS — G479 Sleep disorder, unspecified: Secondary | ICD-10-CM

## 2011-03-24 DIAGNOSIS — I10 Essential (primary) hypertension: Secondary | ICD-10-CM

## 2011-03-24 DIAGNOSIS — F419 Anxiety disorder, unspecified: Secondary | ICD-10-CM

## 2011-03-24 DIAGNOSIS — M797 Fibromyalgia: Secondary | ICD-10-CM | POA: Insufficient documentation

## 2011-03-24 DIAGNOSIS — F411 Generalized anxiety disorder: Secondary | ICD-10-CM

## 2011-03-24 DIAGNOSIS — K219 Gastro-esophageal reflux disease without esophagitis: Secondary | ICD-10-CM

## 2011-03-24 LAB — CBC
HCT: 39.7
MCHC: 33.3
MCV: 88.5
Platelets: 385
RDW: 14.3 — ABNORMAL HIGH

## 2011-03-24 MED ORDER — ONDANSETRON HCL 4 MG PO TABS: 4.0000 mg | ORAL_TABLET | Freq: Three times a day (TID) | ORAL | Status: AC | PRN

## 2011-03-24 MED ORDER — ZOLPIDEM TARTRATE ER 12.5 MG PO TBCR: 12.5000 mg | EXTENDED_RELEASE_TABLET | Freq: Every evening | ORAL | Status: DC | PRN

## 2011-03-24 MED ORDER — LISINOPRIL-HYDROCHLOROTHIAZIDE 10-12.5 MG PO TABS: 1.0000 | ORAL_TABLET | Freq: Every day | ORAL | Status: DC

## 2011-03-24 MED ORDER — CYCLOBENZAPRINE HCL 5 MG PO TABS: 5.0000 mg | ORAL_TABLET | Freq: Three times a day (TID) | ORAL | Status: AC | PRN

## 2011-03-24 MED ORDER — OMEPRAZOLE 40 MG PO CPDR: 40.0000 mg | DELAYED_RELEASE_CAPSULE | Freq: Every day | ORAL | Status: DC

## 2011-03-24 MED ORDER — DULOXETINE HCL 20 MG PO CPEP: 20.0000 mg | ORAL_CAPSULE | Freq: Every day | ORAL | Status: DC

## 2011-03-24 NOTE — Patient Instructions (Signed)
It was nice to meet you today!  Please contact Rudell Cobb to get the [facility] card process started. We will get the records from [facility] to look at your old records.  Please get your medications from [facility]. Let me know if you have any questions or if you need anything at all!  Come back to see me in the next 2-4 weeks to recheck your blood pressure. We will get your flu shot at that time.  Take care! Faten Frieson M. Karah Caruthers, M.D.   Low-Fat, Low-Saturated-Fat, Low-Cholesterol Diets Food Selection Guide BREADS, CEREALS, PASTA, RICE, DRIED PEAS, AND BEANS These products are high in carbohydrates and most are low in fat. Therefore, they can be increased in the diet as substitutes for fatty foods. They too, however, contain calories and should not be eaten in excess. Cereals can be eaten for snacks as well as for breakfast.  Include foods that contain fiber (fruits, vegetables, whole grains, and legumes). Research shows that fiber may lower blood cholesterol levels, especially the water-soluble fiber found in fruits, vegetables, oat products, and legumes. FRUITS AND VEGETABLES It is good to eat fruits and vegetables. Besides being sources of fiber, both are rich in vitamins and some minerals. They help you get the daily allowances of these nutrients. Fruits and vegetables can be used for snacks and desserts. MEATS Limit lean meat, chicken, [place], and fish to no more than 6 ounces per day. Beef, Pork, and Lamb  Use lean cuts of beef, pork, and lamb. Lean cuts include:   Extra-lean ground beef.   Arm roast.   Sirloin tip.   Center-cut ham.   Round steak.   Loin chops.   Rump roast.   Tenderloin.  Trim all fat off the outside of meats before cooking. It is not necessary to severely decrease the intake of red meat, but lean choices should be made. Lean meat is rich in protein and contains a highly absorbable form of iron. Premenopausal women, in particular, should avoid reducing lean  red meat because this could increase the risk for low red blood cells (iron-deficiency anemia). Processed Meats Processed meats, such as bacon, bologna, salami, sausage, and hot dogs contain large quantities of fat, are not rich in valuable nutrients, and should not be eaten very often. Organ Meats The organ meats, such as liver, sweetbreads, kidneys, and brain are very rich in cholesterol. They should be limited. Chicken and [place] These are good sources of protein. The fat of poultry can be reduced by removing the skin and underlying fat layers before cooking. Chicken and [place] can be substituted for lean red meat in the diet. Poultry should not be fried or covered with high-fat sauces. Fish and Shellfish Fish is a good source of protein. Shellfish contain cholesterol, but they usually are low in saturated fatty acids. The preparation of fish is important. Like chicken and [place], they should not be fried or covered with high-fat sauces. EGGS Egg yolks often are hidden in cooked and processed foods. Egg whites contain no fat or cholesterol. They can be eaten often. Try 1 to 2 egg whites instead of whole eggs in recipes or use egg substitutes that do not contain yolk. MILK AND DAIRY PRODUCTS Use skim or 1% milk instead of 2% or whole milk. Decrease whole milk, natural, and processed cheeses. Use nonfat or low-fat (2%) cottage cheese or low-fat cheeses made from vegetable oils. Choose nonfat or low-fat (1 to 2%) yogurt. Experiment with evaporated skim milk in recipes that call  for heavy cream. Substitute low-fat yogurt or low-fat cottage cheese for sour cream in dips and salad dressings. Have at least 2 servings of low-fat dairy products, such as 2 glasses of skim (or 1%) milk each day to help get your daily calcium intake. FATS AND OILS Reduce the total intake of fats, especially saturated fat. Butterfat, lard, and beef fats are high in saturated fat and cholesterol. These should be avoided as much  as possible. Vegetable fats do not contain cholesterol, but certain vegetable fats, such as coconut oil, palm oil, and palm kernel oil are very high in saturated fats. These should be limited. These fats are often used in bakery goods, processed foods, popcorn, oils, and nondairy creamers. Vegetable shortenings and some peanut butters contain hydrogenated oils, which are also saturated fats. Read the labels on these foods and check for saturated vegetable oils. Unsaturated vegetable oils and fats do not raise blood cholesterol. However, they should be limited because they are fats and are high in calories. Total fat should still be limited to 30% of your daily caloric intake. Desirable liquid vegetable oils are corn oil, cottonseed oil, olive oil, canola oil, safflower oil, soybean oil, and sunflower oil. Peanut oil is not as good, but small amounts are acceptable. Buy a heart-healthy tub margarine that has no partially hydrogenated oils in the ingredients. Mayonnaise and salad dressings often are made from unsaturated fats, but they should also be limited because of their high calorie and fat content. Seeds, nuts, peanut butter, olives, and avocados are high in fat, but the fat is mainly the unsaturated type. These foods should be limited mainly to avoid excess calories and fat. OTHER EATING TIPS Snacks  Most sweets should be limited as snacks. They tend to be rich in calories and fats, and their caloric content outweighs their nutritional value. Some good choices in snacks are graham crackers, melba toast, soda crackers, bagels (no egg), English muffins, fruits, and vegetables. These snacks are preferable to snack crackers, [place] fries, and chips. Popcorn should be air-popped or cooked in small amounts of liquid vegetable oil. Desserts Eat fruit, low-fat yogurt, and fruit ices instead of pastries, cake, and cookies. Sherbet, angel food cake, gelatin dessert, frozen low-fat yogurt, or other frozen products  that do not contain saturated fat (pure fruit juice bars, frozen ice pops) are also acceptable.  COOKING METHODS Choose those methods that use little or no fat. They include:  Poaching.   Braising.   Steaming.   Grilling.   Baking.   Stir-frying.   Broiling.   Microwaving.  Foods can be cooked in a nonstick pan without added fat, or use a nonfat cooking spray in regular cookware. Limit fried foods and avoid frying in saturated fat. Add moisture to lean meats by using water, broth, cooking wines, and other nonfat or low-fat sauces along with the cooking methods mentioned above. Soups and stews should be chilled after cooking. The fat that forms on top after a few hours in the refrigerator should be skimmed off. When preparing meals, avoid using excess salt. Salt can contribute to raising blood pressure in some people. EATING AWAY FROM HOME Order entres, potatoes, and vegetables without sauces or butter. When meat exceeds the size of a deck of cards (3 to 4 ounces), the rest can be taken home for another meal. Choose vegetable or fruit salads and ask for low-calorie salad dressings to be served on the side. Use dressings sparingly. Limit high-fat toppings, such as bacon, crumbled eggs,  cheese, sunflower seeds, and olives. Ask for heart-healthy tub margarine instead of butter. Document Released: 11/14/2001 Document Re-Released: 08/19/2009 [facility] Patient Information 2011 [place], [place].

## 2011-03-24 NOTE — Assessment & Plan Note (Signed)
Pt's BP was elevated to 180/116 today. On recheck, it was 170/114. Pt states she has never been on a BP medication and has never been told her BP has been elevated. She does endorse headaches. Will start on Lisinopril/HCTZ today. She will have this filled today. Since her BP is so elevated, I would like to see her back within one month for a recheck. Also, this office visit covered many topics since she was a new patient. I would like the opportunity to better investigate medical issues at a later date.

## 2011-03-24 NOTE — Assessment & Plan Note (Signed)
History of anxiety on benzos in the past. I do not feel comfortable prescribing these today. Was started on Cymbalta for FM pain, which will hopefully help with her anxiety as well. We discussed getting the old records from [facility], but in the rush of the office visit, I overlooked this detail. I will make sure to get this release of information at the next visit.

## 2011-03-24 NOTE — Assessment & Plan Note (Signed)
Pt reported sleep apnea, but also takes Ambien. I refilled this today, but will do better sleep hygiene questioning at her next visit.

## 2011-03-24 NOTE — Progress Notes (Signed)
Subjective:    Patient ID: Sherry [name], female    DOB: 06-May-1961, 50 y.o.   MRN: [id number]  HPI  Pt presents to the office as a new patient to establish care. She has no concerns today. She was previously seen by [facility] but lost her health benefits when she lost her job. She was referred to our practice for medication refills.  1. Fibromyalgia- H/O FM. Pt states she takes Hydrocodone for her pain which was being refilled by [facility]. (Per [place] registry, it was last filled by them on 11/12/10 and by [facility] ED on 12/12/10) She was on a different medication in the past, but she is unsure what it was. Per her daughter, she could not tolerate that and the Hydrocodone is the only medication that helps her. We do not have her old records, and we will request them. Today, she states she is pain all over. 2. Anxiety- History of Anxiety. Takes Xanax  Which was last filled in June by [facility]. She states she gets pain in her chest when she feels anxious. Her CP is non-radiating, non-exertional, relieved by nothing. She has a lot going on in her life, but she has a good support network. She has never tried anything else for anxiety. Again, we do not have old records to see when her anxiety was diagnosed and if she has been on any other medications. 3. Sleep disturbance- Reported history of sleep apnea but has been on Ambien for "some time" to help her sleep at night. Did not discuss sleep hygiene today due to the length and complication of today's visit. Will readdress at next visit. 4. GERD- Been on Protonix in the past, but since she lost her health insurance she has been taking OTC Prilosec which does not help her as much. She does endorse pain/burning in her epigastric area which is characteristic of her GERD.  5. Social issues- Patient recently lost job. Her brother has prostate cancer and went on Hospice a few days ago. She has a lot of stress in her life. As a first time patient, I do not  feel like we can fully go into these details today, but it will be good to re-evaluate at next visit. Including doing a PHQ-9.   Review of Systems  Constitutional: Positive for fatigue. Negative for fever, chills and unexpected weight change.  HENT: Negative for congestion, rhinorrhea and neck pain.   Respiratory: Negative for cough and shortness of breath.   Cardiovascular: Positive for chest pain. Negative for palpitations and leg swelling.  Gastrointestinal: Positive for nausea and abdominal pain. Negative for vomiting.  Genitourinary: Negative for difficulty urinating.  Musculoskeletal: Positive for myalgias, back pain and arthralgias.  Skin: Negative for rash.  All other systems reviewed and are negative.       Objective:   Physical Exam  Nursing note and vitals reviewed. Constitutional: She is oriented to person, place, and time. She appears well-developed and well-nourished. No distress.  HENT:  Head: Normocephalic and atraumatic.  Eyes: Pupils are equal, round, and reactive to light.  Neck: Neck supple. No thyromegaly present.  Cardiovascular: Normal rate, regular rhythm and normal heart sounds.   Pulmonary/Chest: Effort normal and breath sounds normal. She has no wheezes.  Abdominal: Soft. She exhibits no distension.  Musculoskeletal: She exhibits tenderness. She exhibits no edema.  Lymphadenopathy:    She has no cervical adenopathy.  Neurological: She is alert and oriented to person, place, and time.  Assessment & Plan:

## 2011-03-24 NOTE — Assessment & Plan Note (Signed)
She has been on chronic narcotics for FM pain. I do not feel comfortable prescribing these, especially for that indication in a new patient. Discussed with Dr. McDiarmid and patient. At this time, will begin Cymbalta which has been proven to help in FM pain. Patient and her daughter were very hesitant to make this change, but eventually stated they would try it. I made sure they knew I would not be prescribing narcotics. She states she does not exercise due to the pain, but encouraged her to do whatever she could.

## 2011-03-24 NOTE — Assessment & Plan Note (Signed)
Will give Rx for Prilosec. Since she does not have insurance, it may be cheaper for her to buy OTC. She can take 2 OTC as needed. She also reports taking an aspirin daily, which could be making her GERD worse.

## 2011-03-26 LAB — BASIC METABOLIC PANEL
BUN: 10
Calcium: 9.6
Creatinine, Ser: 0.73
GFR calc non Af Amer: >60
Potassium: 3.9

## 2011-03-26 LAB — CBC
Platelets: 461 — ABNORMAL HIGH
WBC: 11.1 — ABNORMAL HIGH

## 2011-03-26 LAB — DIFFERENTIAL
Basophils Absolute: 0
Lymphocytes Relative: 9 — ABNORMAL LOW
Lymphs Abs: 1
Neutro Abs: 9.5 — ABNORMAL HIGH
Neutrophils Relative %: 86 — ABNORMAL HIGH

## 2011-03-26 LAB — URINALYSIS, ROUTINE W REFLEX MICROSCOPIC
Glucose, UA: NEGATIVE
Nitrite: NEGATIVE
pH: 8

## 2011-03-26 LAB — URINE MICROSCOPIC-ADD ON

## 2011-04-03 ENCOUNTER — Telehealth: Payer: Self-pay | Admitting: *Deleted

## 2011-04-03 MED ORDER — ZOLPIDEM TARTRATE 10 MG PO TABS: 10.0000 mg | ORAL_TABLET | Freq: Every evening | ORAL | Status: DC | PRN

## 2011-04-03 MED ORDER — ONDANSETRON HCL 4 MG PO TABS: 4.0000 mg | ORAL_TABLET | Freq: Every day | ORAL | Status: DC | PRN

## 2011-04-03 NOTE — Telephone Encounter (Signed)
ambien cr too expensive please send regular to [facility] outpt pharm, and  zofran send to [facility] [address] pt states it on the $4 list.Zaivion Kundrat, [place]

## 2011-04-06 NOTE — Telephone Encounter (Signed)
lvm to inform pt that she will need to make an appt to have ambien refilled. Told her to call back and make an appt to do this.Loralee Pacas [place]

## 2011-04-07 ENCOUNTER — Ambulatory Visit (HOSPITAL_COMMUNITY)
Admission: RE | Admit: 2011-04-07 | Discharge: 2011-04-07 | Disposition: A | Discharge status: Home / Self Care | Payer: Self-pay | Source: Ambulatory Visit | Attending: Family Medicine | Admitting: Family Medicine

## 2011-04-07 ENCOUNTER — Other Ambulatory Visit: Payer: Self-pay

## 2011-04-07 ENCOUNTER — Telehealth: Payer: Self-pay | Admitting: Family Medicine

## 2011-04-07 ENCOUNTER — Ambulatory Visit (INDEPENDENT_AMBULATORY_CARE_PROVIDER_SITE_OTHER): Payer: Self-pay | Admitting: Family Medicine

## 2011-04-07 ENCOUNTER — Encounter: Payer: Self-pay | Admitting: Family Medicine

## 2011-04-07 DIAGNOSIS — R0789 Other chest pain: Secondary | ICD-10-CM | POA: Insufficient documentation

## 2011-04-07 DIAGNOSIS — R079 Chest pain, unspecified: Secondary | ICD-10-CM | POA: Insufficient documentation

## 2011-04-07 DIAGNOSIS — I10 Essential (primary) hypertension: Secondary | ICD-10-CM

## 2011-04-07 DIAGNOSIS — IMO0001 Reserved for inherently not codable concepts without codable children: Secondary | ICD-10-CM

## 2011-04-07 DIAGNOSIS — Z1239 Encounter for other screening for malignant neoplasm of breast: Secondary | ICD-10-CM

## 2011-04-07 DIAGNOSIS — M797 Fibromyalgia: Secondary | ICD-10-CM

## 2011-04-07 DIAGNOSIS — G479 Sleep disorder, unspecified: Secondary | ICD-10-CM

## 2011-04-07 DIAGNOSIS — R9431 Abnormal electrocardiogram [ECG] [EKG]: Secondary | ICD-10-CM | POA: Insufficient documentation

## 2011-04-07 DIAGNOSIS — I4510 Unspecified right bundle-branch block: Secondary | ICD-10-CM | POA: Insufficient documentation

## 2011-04-07 HISTORY — DX: Chest pain, unspecified: R07.9

## 2011-04-07 LAB — BASIC METABOLIC PANEL
BUN: 12 mg/dL (ref 6–23)
Calcium: 10.4 mg/dL (ref 8.4–10.5)
Creat: 0.8 mg/dL (ref 0.50–1.10)

## 2011-04-07 MED ORDER — ZOLPIDEM TARTRATE 10 MG PO TABS: 10.0000 mg | ORAL_TABLET | Freq: Every evening | ORAL | Status: DC | PRN

## 2011-04-07 MED ORDER — ONDANSETRON HCL 4 MG PO TABS: 4.0000 mg | ORAL_TABLET | Freq: Every day | ORAL | Status: DC | PRN

## 2011-04-07 NOTE — Telephone Encounter (Signed)
Sent previously to [facility] on [address]. Pt aware of that. Now requesting sent to different [facility]. I have sent it to [address].  Jhett Fretwell M. Majesti Gambrell, M.D.

## 2011-04-07 NOTE — Progress Notes (Signed)
  Subjective:    Patient ID: Sherry [name], female    DOB: 13-Sep-1960, 49 y.o.   MRN: [id number]  HPI  Patient is a 50 year old female returning to clinic today for followup of her chronic medical conditions #1 Hypertension- patient diagnosed with hypertension at her last medical visit. She was started on lisinopril/HCTZ at that time. Her blood pressure is greatly improved to 142/81 today. Patient is still under a great amount of social stressors. She continues to have a great deal of anxiety. Patient is recommended to start on a low-fat low-sodium diet at her last visit. She states this has not been going very well. She does have multiple cheek days. Her weight has remained stable with no pain.  #2 Fibromyalgia- patient was given a prescription for Cymbalta at her last office visit. She has not been able to get this medication due to financial reasons. A pharmacy has ordered this medication for her and she will be begin to take it soon. She is hopeful that this will help her pain as well as her anxiety and depression. Patient is depressed with a PHQ-9 of 13 today. She is also requesting a handicap placard renewal for her fibromyalgia. #3 Sleep disturbance- patient is given Ambien CR at her last visit. She states she cannot afford this medication. Requesting a prescription for regular Ambien at this visit. #4 Chest pain- patient states she has chest pain almost daily. It is midline with some radiation to her arm. States it is associated with her anxiety. She's had pain like this for the last several months. She says that it makes her feel "figity". It is not associated with nausea or sweating. Her blood pressure is greatly improved. Under stratification she does not have a family history of MI at young age, she is not a smoker, she does not have diabetes, she does have history of hypertension though.  Patient is due for a flu shot today.   Review of Systems  Constitutional: Negative for fever,  chills and activity change.  Eyes: Negative for visual disturbance.  Respiratory: Negative for shortness of breath and wheezing.   Cardiovascular: Positive for chest pain. Negative for palpitations and leg swelling.  Gastrointestinal: Positive for nausea. Negative for abdominal pain.  Genitourinary: Negative for dysuria.  Musculoskeletal: Positive for myalgias, back pain, joint swelling, arthralgias and gait problem.  Skin: Negative for rash.  Neurological: Negative for dizziness and light-headedness.  All other systems reviewed and are negative.       Objective:   Physical Exam  Vitals reviewed. Constitutional: She is oriented to person, place, and time. She appears well-developed and well-nourished. No distress.  HENT:  Head: Normocephalic and atraumatic.  Eyes: Pupils are equal, round, and reactive to light.  Neck: Normal range of motion. Neck supple. No thyromegaly present.  Cardiovascular: Normal rate, regular rhythm and normal heart sounds.   No murmur heard. Pulmonary/Chest: Effort normal and breath sounds normal. She has no wheezes. She exhibits no tenderness.  Abdominal: Soft. There is tenderness (epigastric).  Musculoskeletal: Normal range of motion. She exhibits tenderness. She exhibits no edema.  Lymphadenopathy:    She has no cervical adenopathy.  Neurological: She is alert and oriented to person, place, and time.  Skin: She is not diaphoretic.          Assessment & Plan:

## 2011-04-07 NOTE — Assessment & Plan Note (Signed)
Likely associated with her anxiety. Her risk stratification includes, she has no family history, nonsmoker, no diabetes. She is hypertensive. Pain is not associated with exercise. She describes it as short bursts of pain. Obtained EKG in office today which showed incomplete right bundle branch block and minimal T-wave depression Q waves V1 through V4. Discussed going to see cardiology for outpatient stress test. Patient declines at this time. Will EKG repeat in 4 weeks to monitor for any additional changes. The patient has any additional pain which includes unrelenting pain, crushing sensation, sweating, nausea she will call 911 or report to the emergency department immediately.

## 2011-04-07 NOTE — Assessment & Plan Note (Signed)
Refill patient's Ambien today. (Previously she was unable to afford Ambien CR) Will have discussion with patient in the future about sleep hygiene etc. Patient is depressed with a PHQ-9 of 13 today. This could be greatly affecting her sleep as well. She is taking Cymbalta perhaps that will help as well. Resources for patient include [facility] as well as [facility]. Also discussed the option of going to see Dr. Pascal Lux with her.

## 2011-04-07 NOTE — Assessment & Plan Note (Signed)
Encourage patient to get her Cymbalta as soon as possible to help with pain. Patient agrees with this plan. She can continue to take over-the-counter Tylenol and ibuprofen as needed for pain. I completed her handicap plaque her today. She will followup with me in 6 weeks

## 2011-04-07 NOTE — Patient Instructions (Signed)
It was nice to see you again today. I am very pleased with your blood pressure! Please keep taking your medication and keep dieting/exercising.  We will check another EKG in 4 weeks. If you have any additional chest pain associated with sweating, crushing pain, faintness or a different type of pain, please call 9-1-1 or go directly to the emergency room.  If your labwork today is abnormal, I will call you. Otherwise, we can discuss it at your next visit.  Please come back to see me in 4 weeks.  Take care! Amber M. Hairford, M.D.

## 2011-04-07 NOTE — Telephone Encounter (Signed)
States that the OP Pharm could not fill Zofran for her and needs to be sent to [facility]

## 2011-04-07 NOTE — Assessment & Plan Note (Signed)
Blood pressure improved today. Will check BMP since patient began Lisinopril HCTZ. Patient will continue to take her lisinopril HCTZ and we'll recheck her blood pressure in 3 months.

## 2011-05-08 ENCOUNTER — Telehealth: Payer: Self-pay | Admitting: Family Medicine

## 2011-05-08 NOTE — Telephone Encounter (Signed)
[name] is needing refills on all of her medications but with the [facility], she isn't sure where they are all supposed to come from and she needs some assistance.  There are 5 medications listed, but she also mentioned Flexeril.

## 2011-05-12 ENCOUNTER — Ambulatory Visit: Payer: Self-pay | Admitting: Family Medicine

## 2011-05-12 ENCOUNTER — Other Ambulatory Visit: Payer: Self-pay | Admitting: Family Medicine

## 2011-05-12 DIAGNOSIS — I10 Essential (primary) hypertension: Secondary | ICD-10-CM

## 2011-05-13 NOTE — Telephone Encounter (Signed)
[facility] She should have refills at [facility] on [address] or the [facility] for all other medications. Thank you! Leonel Mccollum M. Chicquita Mendel, M.D.

## 2011-05-14 NOTE — Telephone Encounter (Signed)
Tried calling patient, no answer, no voicemail. Will try again later. 

## 2011-05-15 NOTE — Telephone Encounter (Signed)
Patient asking that Ambien be sent to [facility] bc she can get it for $4, will forward to MD.

## 2011-05-18 ENCOUNTER — Encounter: Payer: Self-pay | Admitting: Family Medicine

## 2011-05-18 ENCOUNTER — Ambulatory Visit (INDEPENDENT_AMBULATORY_CARE_PROVIDER_SITE_OTHER): Payer: Self-pay | Admitting: Family Medicine

## 2011-05-18 VITALS — BP 145/91 | HR 98 | Temp 98.3°F | Ht 60.0 in | Wt 175.4 lb

## 2011-05-18 DIAGNOSIS — M797 Fibromyalgia: Secondary | ICD-10-CM

## 2011-05-18 DIAGNOSIS — G479 Sleep disorder, unspecified: Secondary | ICD-10-CM

## 2011-05-18 DIAGNOSIS — I10 Essential (primary) hypertension: Secondary | ICD-10-CM

## 2011-05-18 DIAGNOSIS — K219 Gastro-esophageal reflux disease without esophagitis: Secondary | ICD-10-CM

## 2011-05-18 DIAGNOSIS — IMO0001 Reserved for inherently not codable concepts without codable children: Secondary | ICD-10-CM

## 2011-05-18 MED ORDER — PROMETHAZINE HCL 25 MG PO TABS: 12.5000 mg | ORAL_TABLET | Freq: Three times a day (TID) | ORAL | Status: DC | PRN

## 2011-05-18 MED ORDER — LISINOPRIL-HYDROCHLOROTHIAZIDE 10-12.5 MG PO TABS: 1.0000 | ORAL_TABLET | Freq: Every day | ORAL | Status: DC

## 2011-05-18 NOTE — Assessment & Plan Note (Signed)
Patient is doing well on her Cymbalta which she receives through the [facility]. She is on 20mg  now. Will consider increasing to 40 at next visit, if patient is interested.

## 2011-05-18 NOTE — Telephone Encounter (Signed)
Patient coming in today for appointment, will address at visit.

## 2011-05-18 NOTE — Patient Instructions (Signed)
It was so nice to see you today!  I have sent your blood pressure medication and Phenergan prescriptions to [facility] on [address], and given you a written prescription for your Ambien.  If you need anything, please let me know. Please come back to see me in 2-3 months for a follow-up, or earlier if you need anything. I am glad you are doing well and I hope you have a Altamese Cabal Christmas! Enjoy that new baby nephew!  Amber M. Hairford, M.D.

## 2011-05-18 NOTE — Assessment & Plan Note (Addendum)
Patient continues to have symptoms. She is on Prilosec 40mg . Would encourage her to take it in the evening to help with her AM nausea. She is currently on Zofran which she cannot afford. Will switch to Phenergan 25mg  1/2 tab prn which is on the [facility].

## 2011-05-18 NOTE — Telephone Encounter (Signed)
Medication cannot be e-prescribed or faxed in. (I tried to refill on Epic) If she has had it filled at [facility] pharmacy before, she can have refilled request sent to [place]. Otherwise, I can give her a Rx at her next appointment or she can pick it up at [facility]. Please let patient know.   Thank you, thank you! Tekeshia Klahr M. Tag Wurtz, M.D.

## 2011-05-18 NOTE — Progress Notes (Signed)
  Subjective:    Patient ID: Sherry [name], female    DOB: 1960-10-28, 50 y.o.   MRN: [id number]  HPI  Patient is a 50 yo F returning to clinic today for a follow-up of her chronic medical conditions. She does not have any specific concerns today, but she would like to have her medications refilled. She does not have insurance so she would like her medications sent to specific pharmacies for best prices. She also take Zofran prn for nausea from her chronic pain as well as GERD, but she cannot afford this medication at this time and is requesting a new medication. Also, she feels like the cymbalta makes her nauseous but she is doing well on this medication for her FM. She has already had her flu shot this year (but she has 4 children with the flu at this time.) Of note, she recently lost her brother to cancer and she seems a little more down today.  Review of Systems  Constitutional: Negative for fever, activity change and appetite change.  HENT: Negative for congestion.   Respiratory: Negative for cough and shortness of breath.   Cardiovascular: Positive for chest pain.  Gastrointestinal: Positive for nausea. Negative for abdominal pain.  Genitourinary: Negative for difficulty urinating.  Musculoskeletal: Positive for myalgias, back pain and arthralgias.  Skin: Negative for rash.  Neurological: Negative for headaches.  All other systems reviewed and are negative.       Objective:   Physical Exam  Constitutional: She is oriented to person, place, and time. She appears well-developed and well-nourished. No distress.  HENT:  Head: Normocephalic and atraumatic.  Eyes: Pupils are equal, round, and reactive to light.  Neck:       Tender to palpation c-spine   Cardiovascular: Normal rate and regular rhythm.   Pulmonary/Chest: Effort normal. No respiratory distress. She has no wheezes.  Abdominal: Soft. She exhibits no distension. There is no tenderness.  Musculoskeletal: She  exhibits tenderness.  Neurological: She is alert and oriented to person, place, and time.  Skin: Skin is warm and dry. No rash noted.  Psychiatric: Her speech is normal and behavior is normal. Thought content normal. She does not exhibit a depressed mood.          Assessment & Plan:

## 2011-05-18 NOTE — Assessment & Plan Note (Signed)
Patient is taking Ambien which was written for today. She will take it to the [facility] outpatient pharmacy.

## 2011-06-25 ENCOUNTER — Telehealth: Payer: Self-pay | Admitting: Family Medicine

## 2011-06-25 DIAGNOSIS — G479 Sleep disorder, unspecified: Secondary | ICD-10-CM

## 2011-06-25 MED ORDER — ZOLPIDEM TARTRATE 10 MG PO TABS: 10.0000 mg | ORAL_TABLET | Freq: Every evening | ORAL | Status: DC | PRN

## 2011-06-25 NOTE — Telephone Encounter (Signed)
States that the pharmacy has sent refill request for ondansetron (ZOFRAN) 4 MG tablet  Is checking to see when it can be filled Outpatient pharm

## 2011-06-25 NOTE — Telephone Encounter (Signed)
Spoke with patient's daughter.  States she needs a refill on Zolpidem 10 mg sent to [facility].  Paged to Dr. Mikel Cella.  Dr. Mikel Cella is post call. Consulted with Dr. Mauricio Po.  Will call in refill to [facility] today.  Sherry [name]

## 2011-07-20 ENCOUNTER — Other Ambulatory Visit: Payer: Self-pay | Admitting: Family Medicine

## 2011-07-20 DIAGNOSIS — I10 Essential (primary) hypertension: Secondary | ICD-10-CM

## 2011-07-20 MED ORDER — LISINOPRIL 10 MG PO TABS: 10.0000 mg | ORAL_TABLET | Freq: Every day | ORAL | Status: DC

## 2011-07-29 ENCOUNTER — Other Ambulatory Visit: Payer: Self-pay | Admitting: Family Medicine

## 2011-07-29 MED ORDER — QUINAPRIL HCL 10 MG PO TABS: 10.0000 mg | ORAL_TABLET | Freq: Every day | ORAL | Status: DC

## 2011-07-31 ENCOUNTER — Telehealth: Payer: Self-pay | Admitting: *Deleted

## 2011-07-31 DIAGNOSIS — K219 Gastro-esophageal reflux disease without esophagitis: Secondary | ICD-10-CM

## 2011-07-31 MED ORDER — OMEPRAZOLE 20 MG PO CPDR: DELAYED_RELEASE_CAPSULE | ORAL | Status: DC

## 2011-07-31 NOTE — Telephone Encounter (Signed)
Nexium will be fine. Please call Nexium 20mg  #30/5R. Also, I had received a request from [facility] to change Lisinopril to Accupril. There was not an HCTZ on that script so I was not sure if pt had it filled there or [facility]. If she wants it at [facility], please call HCTZ 25mg  #30/5R.  Thank you! Damiya Sandefur M. Opie Fanton, M.D.

## 2011-07-31 NOTE — Telephone Encounter (Signed)
Received faxed refill request for omeprazole 20 mg two capsules daily. RN called this RX to [facility] pharmacy for one month supply. Was told patient has to pay $6.00 for this and they can actually give her nexium free of charge.  Advised will send message to MD to ask about switching for next refill needed.    Also pharmacist states they received RX for Accupril in place of lisinopril /HCTZ and they need RX for HCTZ to go along with it if this is what patient is suppose to be on. Marland Kitchen

## 2011-08-04 MED ORDER — ESOMEPRAZOLE MAGNESIUM 20 MG PO CPDR: 20.0000 mg | DELAYED_RELEASE_CAPSULE | Freq: Every day | ORAL | Status: DC

## 2011-08-04 MED ORDER — HYDROCHLOROTHIAZIDE 25 MG PO TABS: 12.5000 mg | ORAL_TABLET | Freq: Every day | ORAL | Status: DC

## 2011-08-04 NOTE — Telephone Encounter (Signed)
Ordered Nexium 20mg  one tab PO daily #30/5R and selected fax option for [facility].  Thank you! Michelle Wnek M. Wentworth Edelen, M.D.

## 2011-08-04 NOTE — Telephone Encounter (Signed)
Rx called to pharmacy.  Dr.  Mikel Cella advises HCTZ   25 mg ( 1/2 tab daily. )

## 2011-09-02 ENCOUNTER — Ambulatory Visit (INDEPENDENT_AMBULATORY_CARE_PROVIDER_SITE_OTHER): Payer: Self-pay | Admitting: Family Medicine

## 2011-09-02 ENCOUNTER — Encounter: Payer: Self-pay | Admitting: Family Medicine

## 2011-09-02 VITALS — BP 166/100 | HR 90 | Temp 98.5°F | Wt 179.0 lb

## 2011-09-02 DIAGNOSIS — I10 Essential (primary) hypertension: Secondary | ICD-10-CM

## 2011-09-02 DIAGNOSIS — E669 Obesity, unspecified: Secondary | ICD-10-CM | POA: Insufficient documentation

## 2011-09-02 DIAGNOSIS — M797 Fibromyalgia: Secondary | ICD-10-CM

## 2011-09-02 DIAGNOSIS — IMO0001 Reserved for inherently not codable concepts without codable children: Secondary | ICD-10-CM

## 2011-09-02 LAB — LIPID PANEL
Cholesterol: 243 mg/dL — ABNORMAL HIGH (ref 0–200)
HDL: 45 mg/dL (ref >39)
LDL Cholesterol: 173 mg/dL — ABNORMAL HIGH (ref 0–99)
Total CHOL/HDL Ratio: 5.4 Ratio
Triglycerides: 126 mg/dL (ref <150)
VLDL: 25 mg/dL (ref 0–40)

## 2011-09-02 LAB — BASIC METABOLIC PANEL
CO2: 23 mEq/L (ref 19–32)
Chloride: 102 mEq/L (ref 96–112)
Sodium: 138 mEq/L (ref 135–145)

## 2011-09-02 MED ORDER — DULOXETINE HCL 20 MG PO CPEP: 40.0000 mg | ORAL_CAPSULE | Freq: Every day | ORAL | Status: DC

## 2011-09-02 NOTE — Assessment & Plan Note (Signed)
Poorly controlled today, but patient did not take medications this am.  Given HTN and her leg pain, will have her go to Rx clinic for ABI testing as she is concerned about her circulation.

## 2011-09-02 NOTE — Assessment & Plan Note (Signed)
Will check lipids and bmet to see if fasting blood sugar is normal.  She is at risk for these diseases because of her weight and they could be contributing to her pain.

## 2011-09-02 NOTE — Patient Instructions (Signed)
It was good to meet you.  I am sorry you are hurting so badly.  The best medications for fibromyalgia are antidepressants that also treat pain, like Cymbalta that you are on.  You are on a low dose of it, so I am increasing it from 20 mg to 40 mg.    Please ask the front desk to schedule you in Pharmacy clinic for ABI's, - this will make sure your circulation in your feet is good.   I will also check some labs to make sure your blood sugar an cholesterol are normal.  I will send you a letter with these lab results  Please make an appointment to see Dr. Mikel Cella in 4-6 weeks to follow up on your pain.

## 2011-09-02 NOTE — Progress Notes (Signed)
  Subjective:    Patient ID: Sherry [name], female    DOB: 01/19/1961, 51 y.o.   MRN: [id number]  HPI  Ms. Tagliaferro comes in complaining about her fibromyalgia pain.  She says that she has some days that she cannot walk due to her pain.  She says that her feet feel like her bones hurt.  She says the pain is excruciating. She is taking Cymbalta which is not making a difference.   She says she used to see a doctor at [facility] physicians and a fibromyalgia specialist, and used to take narcotics for her pain.  She feels she needs narcotics for her pain.    Patient says her mood has been good lately, denies dysphoric mood.  She is adamant that her pain and her mood are not related.   Her significant other says he saw a commercial about fibromyalgia being caused by diabetes.  He is insistent that this was a fibromyalgia commercial and not a peripheral neuropathy commercial.  The want her tested for diabetes and for poor circulation in her feet.   Review of Systems Pertinent items in HPI.     Objective:   Physical Exam BP 166/100  Pulse 90  Temp(Src) 98.5 F (36.9 C) (Oral)  Wt 179 lb (81.194 kg) General appearance: alert and no distress Head: Normocephalic, without obvious abnormality, atraumatic Neck: no adenopathy, supple, symmetrical, trachea midline, thyroid not enlarged, symmetric, no tenderness/mass/nodules and Posterior neck non-tender Back: symmetric, no curvature. ROM normal. No CVA tenderness., Mild tenderness to palpation in lumbar muscules Extremities: extremities normal, atraumatic, no cyanosis or edema Pulses: 2+ and symmetric Skin: Skin color, texture, turgor normal. No rashes or lesions Neurologic: Sensory: normal Motor: grossly normal       Assessment & Plan:

## 2011-09-02 NOTE — Assessment & Plan Note (Signed)
Patient here asking for narcotics.  I discussed that Cymbalta has better evidence for pain treatment, and will double dose today.  I advised that I do not prescribe narcotics for this disease as they do not treat the problem.

## 2011-09-07 ENCOUNTER — Encounter: Payer: Self-pay | Admitting: Family Medicine

## 2011-09-10 ENCOUNTER — Encounter: Payer: Self-pay | Admitting: Family Medicine

## 2011-09-10 ENCOUNTER — Ambulatory Visit (INDEPENDENT_AMBULATORY_CARE_PROVIDER_SITE_OTHER): Payer: Self-pay | Admitting: Family Medicine

## 2011-09-10 VITALS — BP 137/94 | HR 106 | Temp 98.7°F | Ht 60.0 in | Wt 181.0 lb

## 2011-09-10 DIAGNOSIS — E8881 Metabolic syndrome: Secondary | ICD-10-CM | POA: Insufficient documentation

## 2011-09-10 DIAGNOSIS — IMO0001 Reserved for inherently not codable concepts without codable children: Secondary | ICD-10-CM

## 2011-09-10 DIAGNOSIS — R7309 Other abnormal glucose: Secondary | ICD-10-CM

## 2011-09-10 DIAGNOSIS — M797 Fibromyalgia: Secondary | ICD-10-CM

## 2011-09-10 DIAGNOSIS — R7303 Prediabetes: Secondary | ICD-10-CM

## 2011-09-10 DIAGNOSIS — E7800 Pure hypercholesterolemia, unspecified: Secondary | ICD-10-CM

## 2011-09-10 LAB — POCT GLYCOSYLATED HEMOGLOBIN (HGB A1C): Hemoglobin A1C: 6.2

## 2011-09-10 MED ORDER — CYCLOBENZAPRINE HCL 5 MG PO TABS: 5.0000 mg | ORAL_TABLET | Freq: Three times a day (TID) | ORAL | Status: DC | PRN

## 2011-09-10 MED ORDER — SIMVASTATIN 10 MG PO TABS: 10.0000 mg | ORAL_TABLET | Freq: Every day | ORAL | Status: DC

## 2011-09-10 NOTE — Progress Notes (Signed)
Subjective:     Patient ID: Sherry [name], female   DOB: Jun 24, 1960, 51 y.o.   MRN: [id number]  HPI Patient is a 51 year old female presenting today for followup visit.  Problems to be discussed they are as follows:  1. Fibromyalgia pain:  Patient was seen recently by Dr. Lula Olszewski in the office for her fibromyalgia pain. She states this pain has improved. She is currently on Cymbalta 40 mg daily although she says this makes her sick she seems to be overall happy with that. Patient is aware that we will not prescribe narcotics for her pain. She did not request narcotics today. She does continue to complain of some burning sensation in her feet bilaterally although this does not seem to be a large concern today. Patient is also requesting a refill on her Flexeril. She states this medication helps her the most and would like to continue it. Patient denies any red or swollen joints. She denies any fevers. She denies any recent trauma, injury or falls.  2. High Cholesterol:  Patient had a recent fasting lipid profile showed elevated cholesterol. Patient states she's never had high cholesterol and is concerned about it. Her total cholesterol was 243, her LDL was 173 which is above goal for her. Patient states she has been trying to diet some since her last visit with Dr. Lula Olszewski. She is watching her fried food intake as well as her fatty food intake. I taken gradually her on this as this is a Management consultant. I hope she continues to follow this diet. We discussed if patient would be interested in taking a medication for her cholesterol she does agree.   3. High blood glucose: Patient had glucose elevated to 131 on last lab work. Patient states she has never had a history of diabetes but she is concerned about it given her family history. Patient denies polydipsia, polyuria, weakness. We will get a A1c while in the office today   4. Weight: We have had a discussion before about [name]'s  weight. She previously did not seem interested in weight loss but states that she has been making some changes recently and would like to lose weight. I have encouraged her that this would be very beneficial for her health. Patient is asking for information on who she should and should not eat. Husband is in the room with her and is very supportive.  Review of Systems  Constitutional: Negative for fever, chills and activity change.  HENT: Negative for congestion.   Respiratory: Negative for cough and shortness of breath.   Cardiovascular: Negative for chest pain.  Gastrointestinal: Negative for abdominal pain.  Genitourinary: Negative for urgency.  Musculoskeletal: Positive for myalgias, back pain and arthralgias.  Skin: Negative for rash.       Objective:   Physical Exam  Constitutional: She appears well-developed and well-nourished. No distress.  Cardiovascular: Normal rate and regular rhythm.   Pulmonary/Chest: Effort normal and breath sounds normal.  Abdominal: Soft. There is no tenderness.  Musculoskeletal:       Grossly normal  Skin: No rash noted.       Assessment:     51 yo F presenting for follow-up appointment    Plan:

## 2011-09-10 NOTE — Assessment & Plan Note (Signed)
A1c checked in the office today found to be 6.2. 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 She should continue her diet and exercise. We will follow this up in 6 months. At this time not a candidate for medication and patient understands this, but medication is not completely out of the question if she should continue to have elevated blood glucose.

## 2011-09-10 NOTE — Assessment & Plan Note (Signed)
Patient does not seem to have a large complaining of pain today. She states her mood is good and denies any depression. The patient is doing well on Cymbalta even though she does complain of some GI upset with the medication. I encouraged her to continue Cymbalta 40 mg daily and followup in 3 months. Refilled Flexeril #60 with 0 refills.

## 2011-09-10 NOTE — Patient Instructions (Addendum)
It was good to see you today. I am glad you are feeling better.  I have sent a Rx for Zocor to [facility]. This is for your cholesterol. Take it daily.  [facility] the script for it.  Please continue to diet and exercise. I would like to see you back in 2-3 months, or earlier if you have any concerns. Zuzanna Maroney M. Meleny Tregoning, M.D.   Low-Fat, Low-Saturated-Fat, Low-Cholesterol Diets  Food Selection Guide  BREADS, CEREALS, PASTA, RICE, DRIED PEAS, AND BEANS  These products are high in carbohydrates and most are low in fat. Therefore, they can be increased in the diet as substitutes for fatty foods. They too, however, contain calories and should not be eaten in excess. Cereals can be eaten for snacks as well as for breakfast.  Include foods that contain fiber (fruits, vegetables, whole grains, and legumes). Research shows that fiber may lower blood cholesterol levels, especially the water-soluble fiber found in fruits, vegetables, oat products, and legumes.  FRUITS AND VEGETABLES  It is good to eat fruits and vegetables. Besides being sources of fiber, both are rich in vitamins and some minerals. They help you get the daily allowances of these nutrients. Fruits and vegetables can be used for snacks and desserts.  MEATS  Limit lean meat, chicken, [place], and fish to no more than 6 ounces per day.  Beef, Pork, and Lamb  Use lean cuts of beef, pork, and lamb. Lean cuts include:  Extra-lean ground beef.  Arm roast.  Sirloin tip.  Center-cut ham.  Round steak.  Loin chops.  Rump roast.  Tenderloin.  Trim all fat off the outside of meats before cooking. It is not necessary to severely decrease the intake of red meat, but lean choices should be made. Lean meat is rich in protein and contains a highly absorbable form of iron. Premenopausal women, in particular, should avoid reducing lean red meat because this could increase the risk for low red blood  cells (iron-deficiency anemia).  Processed Meats  Processed meats, such as bacon, bologna, salami, sausage, and hot dogs contain large quantities of fat, are not rich in valuable nutrients, and should not be eaten very often.  Organ Meats  The organ meats, such as liver, sweetbreads, kidneys, and brain are very rich in cholesterol. They should be limited.  Chicken and [place]  These are good sources of protein. The fat of poultry can be reduced by removing the skin and underlying fat layers before cooking. Chicken and [place] can be substituted for lean red meat in the diet. Poultry should not be fried or covered with high-fat sauces.  Fish and Shellfish  Fish is a good source of protein. Shellfish contain cholesterol, but they usually are low in saturated fatty acids. The preparation of fish is important. Like chicken and [place], they should not be fried or covered with high-fat sauces.  EGGS  Egg yolks often are hidden in cooked and processed foods. Egg whites contain no fat or cholesterol. They can be eaten often. Try 1 to 2 egg whites instead of whole eggs in recipes or use egg substitutes that do not contain yolk.  MILK AND DAIRY PRODUCTS  Use skim or 1% milk instead of 2% or whole milk. Decrease whole milk, natural, and processed cheeses. Use nonfat or low-fat (2%) cottage cheese or low-fat cheeses made from vegetable oils. Choose nonfat or low-fat (1 to 2%) yogurt. Experiment with evaporated skim milk in recipes  that call for heavy cream. Substitute low-fat yogurt or low-fat cottage cheese for sour cream in dips and salad dressings. Have at least 2 servings of low-fat dairy products, such as 2 glasses of skim (or 1%) milk each day to help get your daily calcium intake.  FATS AND OILS  Reduce the total intake of fats, especially saturated fat. Butterfat, lard, and beef fats are high in saturated fat and cholesterol. These should be avoided as much as possible. Vegetable fats do not contain  cholesterol, but certain vegetable fats, such as coconut oil, palm oil, and palm kernel oil are very high in saturated fats. These should be limited. These fats are often used in bakery goods, processed foods, popcorn, oils, and nondairy creamers. Vegetable shortenings and some peanut butters contain hydrogenated oils, which are also saturated fats. Read the labels on these foods and check for saturated vegetable oils.  Unsaturated vegetable oils and fats do not raise blood cholesterol. However, they should be limited because they are fats and are high in calories. Total fat should still be limited to 30% of your daily caloric intake. Desirable liquid vegetable oils are corn oil, cottonseed oil, olive oil, canola oil, safflower oil, soybean oil, and sunflower oil. Peanut oil is not as good, but small amounts are acceptable. Buy a heart-healthy tub margarine that has no partially hydrogenated oils in the ingredients. Mayonnaise and salad dressings often are made from unsaturated fats, but they should also be limited because of their high calorie and fat content.  Seeds, nuts, peanut butter, olives, and avocados are high in fat, but the fat is mainly the unsaturated type. These foods should be limited mainly to avoid excess calories and fat.  OTHER EATING TIPS  Snacks  Most sweets should be limited as snacks. They tend to be rich in calories and fats, and their caloric content outweighs their nutritional value. Some good choices in snacks are graham crackers, melba toast, soda crackers, bagels (no egg), English muffins, fruits, and vegetables. These snacks are preferable to snack crackers, [place] fries, and chips. Popcorn should be air-popped or cooked in small amounts of liquid vegetable oil.  Desserts  Eat fruit, low-fat yogurt, and fruit ices instead of pastries, cake, and cookies. Sherbet, angel food cake, gelatin dessert, frozen low-fat yogurt, or other frozen products that do not contain saturated fat  (pure fruit juice bars, frozen ice pops) are also acceptable.  COOKING METHODS  Choose those methods that use little or no fat. They include:  Poaching.  Braising.  Steaming.  Grilling.  Baking.  Stir-frying.  Broiling.  Microwaving.  Foods can be cooked in a nonstick pan without added fat, or use a nonfat cooking spray in regular cookware. Limit fried foods and avoid frying in saturated fat. Add moisture to lean meats by using water, broth, cooking wines, and other nonfat or low-fat sauces along with the cooking methods mentioned above.  Soups and stews should be chilled after cooking. The fat that forms on top after a few hours in the refrigerator should be skimmed off. When preparing meals, avoid using excess salt. Salt can contribute to raising blood pressure in some people.  EATING AWAY FROM HOME  Order entres, potatoes, and vegetables without sauces or butter. When meat exceeds the size of a deck of cards (3 to 4 ounces), the rest can be taken home for another meal.  Choose vegetable or fruit salads and ask for low-calorie salad dressings to be served on the side. Use dressings sparingly.  Limit high-fat toppings, such as bacon, crumbled eggs, cheese, sunflower seeds, and olives. Ask for heart-healthy tub margarine instead of butter.  Document Released: 11/14/2001 Document Re-Released: 08/19/2009  [facility] Patient Information 2011 [place], [place].  Cholesterol Control Diet Cholesterol levels in your body are determined significantly by your diet. Cholesterol levels may also be related to heart disease. The following material helps to explain this relationship and discusses what you can do to help keep your heart healthy. Not all cholesterol is bad. Low-density lipoprotein (LDL) cholesterol is the "bad" cholesterol. It may cause fatty deposits to build up inside your arteries. High-density lipoprotein (HDL) cholesterol is "good." It helps to remove the "bad" LDL cholesterol from your blood.  Cholesterol is a very important risk factor for heart disease. Other risk factors are high blood pressure, smoking, stress, heredity, and weight. The heart muscle gets its supply of blood through the coronary arteries. If your LDL cholesterol is high and your HDL cholesterol is low, you are at risk for having fatty deposits build up in your coronary arteries. This leaves less room through which blood can flow. Without sufficient blood and oxygen, the heart muscle cannot function properly and you may feel chest pains (angina pectoris). When a coronary artery closes up entirely, a part of the heart muscle may die, causing a heart attack (myocardial infarction). CHECKING CHOLESTEROL When your caregiver sends your blood to a lab to be analyzed for cholesterol, a complete lipid (fat) profile may be done. With this test, the total amount of cholesterol and levels of LDL and HDL are determined. Triglycerides are a type of fat that circulates in the blood and can also be used to determine heart disease risk. The list below describes what the numbers should be: Test: Total Cholesterol.  Less than 200 mg/dl.  Test: LDL "bad cholesterol."  Less than 100 mg/dl.   Less than 70 mg/dl if you are at very high risk of a heart attack or sudden cardiac death.  Test: HDL "good cholesterol."  Greater than 50 mg/dl for women.   Greater than 40 mg/dl for men.  Test: Triglycerides.  Less than 150 mg/dl.  CONTROLLING CHOLESTEROL WITH DIET Although exercise and lifestyle factors are important, your diet is key. That is because certain foods are known to raise cholesterol and others to lower it. The goal is to balance foods for their effect on cholesterol and more importantly, to replace saturated and trans fat with other types of fat, such as monounsaturated fat, polyunsaturated fat, and omega-3 fatty acids. On average, a person should consume no more than 15 to 17 g of saturated fat daily. Saturated and trans fats are  considered "bad" fats, and they will raise LDL cholesterol. Saturated fats are primarily found in animal products such as meats, butter, and cream. However, that does not mean you need to sacrifice all your favorite foods. Today, there are good tasting, low-fat, low-cholesterol substitutes for most of the things you like to eat. Choose low-fat or nonfat alternatives. Choose round or loin cuts of red meat, since these types of cuts are lowest in fat and cholesterol. Chicken (without the skin), fish, veal, and ground [place] breast are excellent choices. Eliminate fatty meats, such as hot dogs and salami. Even shellfish have little or no saturated fat. Have a 3 oz (85 g) portion when you eat lean meat, poultry, or fish. Trans fats are also called "partially hydrogenated oils." They are oils that have been scientifically manipulated so that they are solid at  room temperature resulting in a longer shelf life and improved taste and texture of foods in which they are added. Trans fats are found in stick margarine, some tub margarines, cookies, crackers, and baked goods.  When baking and cooking, oils are an excellent substitute for butter. The monounsaturated oils are especially beneficial since it is believed they lower LDL and raise HDL. The oils you should avoid entirely are saturated tropical oils, such as coconut and palm.  Remember to eat liberally from food groups that are naturally free of saturated and trans fat, including fish, fruit, vegetables, beans, grains (barley, rice, couscous, bulgur wheat), and pasta (without cream sauces).  IDENTIFYING FOODS THAT LOWER CHOLESTEROL  Soluble fiber may lower your cholesterol. This type of fiber is found in fruits such as apples, vegetables such as broccoli, potatoes, and carrots, legumes such as beans, peas, and lentils, and grains such as barley. Foods fortified with plant sterols (phytosterol) may also lower cholesterol. You should eat at least 2 g per day of these  foods for a cholesterol lowering effect.  Read package labels to identify low-saturated fats, trans fats free, and low-fat foods at the supermarket. Select cheeses that have only 2 to 3 g saturated fat per ounce. Use a heart-healthy tub margarine that is free of trans fats or partially hydrogenated oil. When buying baked goods (cookies, crackers), avoid partially hydrogenated oils. Breads and muffins should be made from whole grains (whole-wheat or whole oat flour, instead of "flour" or "enriched flour"). Buy non-creamy canned soups with reduced salt and no added fats.  FOOD PREPARATION TECHNIQUES  Never deep-fry. If you must fry, either stir-fry, which uses very little fat, or use non-stick cooking sprays. When possible, broil, bake, or roast meats, and steam vegetables. Instead of dressing vegetables with butter or margarine, use lemon and herbs, applesauce and cinnamon (for squash and sweet potatoes), nonfat yogurt, salsa, and low-fat dressings for salads.  LOW-SATURATED FAT / LOW-FAT FOOD SUBSTITUTES Meats / Saturated Fat (g)  Avoid: Steak, marbled (3 oz/85 g) / 11 g   Choose: Steak, lean (3 oz/85 g) / 4 g   Avoid: Hamburger (3 oz/85 g) / 7 g   Choose: Hamburger, lean (3 oz/85 g) / 5 g   Avoid: Ham (3 oz/85 g) / 6 g   Choose: Ham, lean cut (3 oz/85 g) / 2.4 g   Avoid: Chicken, with skin, dark meat (3 oz/85 g) / 4 g   Choose: Chicken, skin removed, dark meat (3 oz/85 g) / 2 g   Avoid: Chicken, with skin, light meat (3 oz/85 g) / 2.5 g   Choose: Chicken, skin removed, light meat (3 oz/85 g) / 1 g  Dairy / Saturated Fat (g)  Avoid: Whole milk (1 cup) / 5 g   Choose: Low-fat milk, 2% (1 cup) / 3 g   Choose: Low-fat milk, 1% (1 cup) / 1.5 g   Choose: Skim milk (1 cup) / 0.3 g   Avoid: Hard cheese (1 oz/28 g) / 6 g   Choose: Skim milk cheese (1 oz/28 g) / 2 to 3 g   Avoid: Cottage cheese, 4% fat (1 cup) / 6.5 g   Choose: Low-fat cottage cheese, 1% fat (1 cup) / 1.5 g    Avoid: Ice cream (1 cup) / 9 g   Choose: Sherbet (1 cup) / 2.5 g   Choose: Nonfat frozen yogurt (1 cup) / 0.3 g   Choose: Frozen fruit bar / trace   Avoid:  Whipped cream (1 tbs) / 3.5 g   Choose: Nondairy whipped topping (1 tbs) / 1 g  Condiments / Saturated Fat (g)  Avoid: Mayonnaise (1 tbs) / 2 g   Choose: Low-fat mayonnaise (1 tbs) / 1 g   Avoid: Butter (1 tbs) / 7 g   Choose: Extra light margarine (1 tbs) / 1 g   Avoid: Coconut oil (1 tbs) / 11.8 g   Choose: Olive oil (1 tbs) / 1.8 g   Choose: Corn oil (1 tbs) / 1.7 g   Choose: Safflower oil (1 tbs) / 1.2 g   Choose: Sunflower oil (1 tbs) / 1.4 g   Choose: Soybean oil (1 tbs) / 2.4 g   Choose: Canola oil (1 tbs) / 1 g  Document Released: 05/25/2005 Document Revised: 05/14/2011 Document Reviewed: 11/13/2010 [facility] Patient Information 2012 [place], [place].

## 2011-09-10 NOTE — Assessment & Plan Note (Signed)
Patient with no known history of high cholesterol now with lipids showing LDL of 173. Patient states she will take medications. Given Zocor 10 mg to take daily. Discussed that some patients may have muscle aches and cramps with these medications and if her muscle pain worsens to let me know. I am optimistic that this medication will work well for her. Again continue to encourage her to diet and exercise.

## 2011-09-25 ENCOUNTER — Encounter: Payer: Self-pay | Admitting: Family Medicine

## 2011-09-25 ENCOUNTER — Ambulatory Visit (INDEPENDENT_AMBULATORY_CARE_PROVIDER_SITE_OTHER): Payer: Self-pay | Admitting: Family Medicine

## 2011-09-25 VITALS — BP 150/90 | HR 111 | Temp 98.3°F | Ht 60.0 in | Wt 179.1 lb

## 2011-09-25 DIAGNOSIS — R059 Cough, unspecified: Secondary | ICD-10-CM

## 2011-09-25 DIAGNOSIS — R05 Cough: Secondary | ICD-10-CM | POA: Insufficient documentation

## 2011-09-25 HISTORY — DX: Cough, unspecified: R05.9

## 2011-09-25 MED ORDER — GUAIFENESIN-CODEINE 100-10 MG/5ML PO SYRP: 5.0000 mL | ORAL_SOLUTION | Freq: Three times a day (TID) | ORAL | Status: AC | PRN

## 2011-09-25 MED ORDER — AZITHROMYCIN 250 MG PO TABS: ORAL_TABLET | ORAL | Status: AC

## 2011-09-25 NOTE — Progress Notes (Signed)
  Subjective:    Patient ID: Sherry [name], female    DOB: 24-May-1961, 51 y.o.   MRN: [id number]  HPI 1.  Cough:  Present times about 10 days. She describes persistent cough that is gradually worsening. She has had some posttussive emesis for the past several days. Cough productive of thick yellow sputum. She otherwise does not feel nauseous. Does endorse some subjective fevers but has not recorded her temperature. Started with some nasal congestion and drainage and then spread within the data to her chest. She does not have history of seasonal allergies or asthma. She states "I feel pretty bad."   Review of Systems See HPI above for review of systems.       Objective:   Physical Exam  BP 150/90  Pulse 111  Temp(Src) 98.3 F (36.8 C) (Oral)  Ht 5' (1.524 m)  Wt 179 lb 1.6 oz (81.239 kg)  BMI 34.98 kg/m2 Gen:  African American female sitting on exam table somewhat ill-appearing. Nontoxic. Head: Normocephalic atraumatic Eyes: EOMI, PERRL, sclera and conjunctiva non-erythematous Nose:  Nasal turbinates grossly enlarged bilaterally. Some exudates noted. Sinuses nontender Mouth: Mucosa membranes moist. Tonsils +2, nonenlarged, non-erythematous. Neck: No cervical lymphadenopathy noted Heart:  RRR, no murmurs auscultated. Pulm:  Clear to auscultation bilaterally with good air movement.  No wheezes or rales noted.   Ext:  No edema        Assessment & Plan:

## 2011-09-25 NOTE — Patient Instructions (Signed)
Take the cough syrup every 8 hours if needed for relief of her cough. Be sure to use it before you go to sleep at night.  Take the antibiotic 2 pills the first day and one pill daily after that. Finished the antibiotic even if you start to feel better.  Come back and see [place] next week if you're not having any improvement.

## 2011-09-25 NOTE — Assessment & Plan Note (Signed)
I see that she is on both quinapril and lisinopril. When asked about this she states she has not started the quinapril. She states previous physician told her to complete the lisinopril/hydrochlorothiazide combination pill and then start Accupril instead. My concern was that she may have cough triggered by ACE inhibitor. However on further examining and talk with patient it seems that this is more of an infectious process. She is continuing to worsen without signs of improvement we'll start antibiotics today. Azithromycin x5 days. Guaif-codeine for relief of cough. Continue Nexium as she does have some posttussive emesis

## 2011-10-15 ENCOUNTER — Other Ambulatory Visit: Payer: Self-pay | Admitting: Family Medicine

## 2011-10-15 MED ORDER — DULOXETINE HCL 20 MG PO CPEP: 40.0000 mg | ORAL_CAPSULE | Freq: Every day | ORAL | Status: DC

## 2011-10-23 ENCOUNTER — Encounter (HOSPITAL_COMMUNITY): Payer: Self-pay

## 2011-10-23 ENCOUNTER — Emergency Department (HOSPITAL_COMMUNITY): Payer: Self-pay

## 2011-10-23 ENCOUNTER — Emergency Department (HOSPITAL_COMMUNITY)
Admission: EM | Admit: 2011-10-23 | Discharge: 2011-10-23 | Disposition: A | Discharge status: Home / Self Care | Payer: Self-pay | Attending: Emergency Medicine | Admitting: Emergency Medicine

## 2011-10-23 DIAGNOSIS — J40 Bronchitis, not specified as acute or chronic: Secondary | ICD-10-CM | POA: Insufficient documentation

## 2011-10-23 DIAGNOSIS — T148XXA Other injury of unspecified body region, initial encounter: Secondary | ICD-10-CM | POA: Insufficient documentation

## 2011-10-23 DIAGNOSIS — X58XXXA Exposure to other specified factors, initial encounter: Secondary | ICD-10-CM | POA: Insufficient documentation

## 2011-10-23 HISTORY — DX: Fibromyalgia: M79.7

## 2011-10-23 LAB — COMPREHENSIVE METABOLIC PANEL
ALT: 36 U/L — ABNORMAL HIGH (ref 0–35)
AST: 25 U/L (ref 0–37)
Alkaline Phosphatase: 111 U/L (ref 39–117)
CO2: 26 mEq/L (ref 19–32)
Calcium: 9.7 mg/dL (ref 8.4–10.5)
Chloride: 101 mEq/L (ref 96–112)
GFR calc non Af Amer: >90 mL/min (ref >90)
Potassium: 4.1 mEq/L (ref 3.5–5.1)
Sodium: 138 mEq/L (ref 135–145)

## 2011-10-23 LAB — URINALYSIS, ROUTINE W REFLEX MICROSCOPIC
Bilirubin Urine: NEGATIVE
Leukocytes, UA: NEGATIVE
Nitrite: NEGATIVE
Specific Gravity, Urine: 1.011 (ref 1.005–1.030)
Urobilinogen, UA: 1 mg/dL (ref 0.0–1.0)

## 2011-10-23 LAB — CBC
Platelets: 467 10*3/uL — ABNORMAL HIGH (ref 150–400)
RBC: 4.16 MIL/uL (ref 3.87–5.11)
RDW: 13.9 % (ref 11.5–15.5)
WBC: 9.8 10*3/uL (ref 4.0–10.5)

## 2011-10-23 LAB — DIFFERENTIAL
Basophils Absolute: 0 10*3/uL (ref 0.0–0.1)
Eosinophils Relative: 2 % (ref 0–5)
Lymphocytes Relative: 43 % (ref 12–46)
Lymphs Abs: 4.2 10*3/uL — ABNORMAL HIGH (ref 0.7–4.0)
Neutro Abs: 4.7 10*3/uL (ref 1.7–7.7)
Neutrophils Relative %: 49 % (ref 43–77)

## 2011-10-23 LAB — URINE MICROSCOPIC-ADD ON

## 2011-10-23 LAB — PREGNANCY, URINE: Preg Test, Ur: NEGATIVE

## 2011-10-23 MED ORDER — METHOCARBAMOL 500 MG PO TABS: 500.0000 mg | ORAL_TABLET | Freq: Two times a day (BID) | ORAL | Status: AC

## 2011-10-23 MED ORDER — BENZONATATE 100 MG PO CAPS: 100.0000 mg | ORAL_CAPSULE | Freq: Three times a day (TID) | ORAL | Status: AC | PRN

## 2011-10-23 MED ORDER — TRAMADOL HCL 50 MG PO TABS: 50.0000 mg | ORAL_TABLET | Freq: Four times a day (QID) | ORAL | Status: AC | PRN

## 2011-10-23 MED ORDER — METHOCARBAMOL 500 MG PO TABS
500.0000 mg | ORAL_TABLET | Freq: Once | ORAL | Status: AC
  Administered 2011-10-23: 500 mg via ORAL
  Filled 2011-10-23: qty 1

## 2011-10-23 MED ORDER — KETOROLAC TROMETHAMINE 30 MG/ML IJ SOLN
INTRAMUSCULAR | Status: AC
  Administered 2011-10-23: 30 mg
  Filled 2011-10-23: qty 1

## 2011-10-23 MED ORDER — KETOROLAC TROMETHAMINE 60 MG/2ML IM SOLN
60.0000 mg | Freq: Once | INTRAMUSCULAR | Status: DC
Start: 2011-10-23 — End: 2011-10-24

## 2011-10-23 MED ORDER — TRAMADOL HCL 50 MG PO TABS
50.0000 mg | ORAL_TABLET | Freq: Once | ORAL | Status: AC
  Administered 2011-10-23: 50 mg via ORAL
  Filled 2011-10-23: qty 1

## 2011-10-23 MED ORDER — SODIUM CHLORIDE 0.9 % IV BOLUS (SEPSIS)
1000.0000 mL | Freq: Once | INTRAVENOUS | Status: AC
  Administered 2011-10-23: 1000 mL via INTRAVENOUS

## 2011-10-23 MED ORDER — AZITHROMYCIN 250 MG PO TABS: 250.0000 mg | ORAL_TABLET | Freq: Every day | ORAL | Status: AC

## 2011-10-23 NOTE — ED Notes (Signed)
Patient c/o cough that is followed by vomiting x 1 month. Patient now c/o abd pain and low back pain that started last night,. Patient denies dysuria, fever, chills, or diarrhea, or injury to back.

## 2011-10-23 NOTE — Discharge Instructions (Signed)
Bronchitis Bronchitis is a problem of the air tubes leading to your lungs. This problem makes it hard for air to get in and out of the lungs. You may cough a lot because your air tubes are narrow. Going without care can cause lasting (chronic) bronchitis. HOME CARE   Drink enough fluids to keep your pee (urine) clear or pale yellow.   Use a cool mist humidifier.   Quit smoking if you smoke. If you keep smoking, the bronchitis might not get better.   Only take medicine as told by your doctor.  GET HELP RIGHT AWAY IF:   Coughing keeps you awake.   You start to wheeze.   You become more sick or weak.   You have a hard time breathing or get short of breath.   You cough up blood.   Coughing lasts more than 2 weeks.   You have a fever.   Your baby is older than 3 months with a rectal temperature of 102 F (38.9 C) or higher.   Your baby is 3 months old or younger with a rectal temperature of 100.4 F (38 C) or higher.  MAKE SURE YOU:  Understand these instructions.   Will watch your condition.   Will get help right away if you are not doing well or get worse.  Document Released: 11/11/2007 Document Revised: 05/14/2011 Document Reviewed: 04/26/2009 [employer] Patient Information 2012 [employer]. 

## 2011-10-23 NOTE — ED Notes (Signed)
MD Yelverton at bedside. 

## 2011-10-23 NOTE — ED Provider Notes (Signed)
History     CSN: [id number]  Arrival date & time 10/23/11  1727   First MD Initiated Contact with Patient 10/23/11 2027      Chief Complaint  Patient presents with  . Back Pain  . Abdominal Pain    (Consider location/radiation/quality/duration/timing/severity/associated sxs/prior treatment) HPI Pt present with non-productive cough x 1 month with post-tussive emesis. No fever chills, wheezing. Since yesterday she has been having R flank/thoracic back pain worse with deep breathing and coughing. Pt thinks she may have been from the excessive coughing. She also c/o of generalized abd wall pain. Again, she believes this is related to her coughing and vomiting. No vaginal or urinary complaints. No diarrhea Past Medical History  Diagnosis Date  . Anxiety   . Chronic pain   . Acid reflux disease   . Migraine   . Sleep apnea   . Fibromyalgia     Past Surgical History  Procedure Date  . Cholecystectomy     2008  . Total abdominal hysterectomy     1986    Family History  Problem Relation Age of Onset  . Diabetes Mother   . Diabetes Sister   . Hypertension Mother   . Hypertension Father   . Cancer Father     Prostate  . Cancer Brother     Prostate  . Depression Mother   . Alcohol abuse Father     History  Substance Use Topics  . Smoking status: Never Smoker   . Smokeless tobacco: Not on file  . Alcohol Use: No    OB History    Grav Para Term Preterm Abortions TAB SAB Ect Mult Living                  Review of Systems  Constitutional: Negative for fever and chills.  Respiratory: Positive for cough. Negative for shortness of breath and wheezing.   Cardiovascular: Negative for chest pain, palpitations and leg swelling.  Gastrointestinal: Positive for vomiting and abdominal pain. Negative for nausea, diarrhea and constipation.  Genitourinary: Positive for flank pain. Negative for dysuria, hematuria, vaginal bleeding, vaginal discharge and difficulty urinating.    Musculoskeletal: Positive for back pain.  Skin: Negative for wound.  Neurological: Negative for dizziness, weakness, numbness and headaches.    Allergies  Review of patient's allergies indicates no known allergies.  Home Medications   Current Outpatient Rx  Name Route Sig Dispense Refill  . ASPIRIN 81 MG PO CHEW Oral Chew 81 mg by mouth daily.    . DULOXETINE HCL 20 MG PO CPEP Oral Take 40 mg by mouth at bedtime.    Marland Kitchen ESOMEPRAZOLE MAGNESIUM 20 MG PO CPDR Oral Take 1 capsule (20 mg total) by mouth daily before breakfast. 30 capsule 5  . HYDROCHLOROTHIAZIDE 25 MG PO TABS Oral Take 25 mg by mouth daily.    Marland Kitchen LISINOPRIL-HYDROCHLOROTHIAZIDE 10-12.5 MG PO TABS Oral Take 1 tablet by mouth daily. 30 tablet 5  . ONDANSETRON HCL 4 MG PO TABS Oral Take 1 tablet (4 mg total) by mouth daily as needed for nausea. 30 tablet 1  . SIMVASTATIN 10 MG PO TABS Oral Take 1 tablet (10 mg total) by mouth at bedtime. 90 tablet 3  . ZOLPIDEM TARTRATE 5 MG PO TABS Oral Take 5 mg by mouth at bedtime as needed.    . AZITHROMYCIN 250 MG PO TABS Oral Take 1 tablet (250 mg total) by mouth daily. Take first 2 tablets together, then 1 every day until finished. 6  tablet 0  . BENZONATATE 100 MG PO CAPS Oral Take 1 capsule (100 mg total) by mouth 3 (three) times daily as needed for cough. 20 capsule 0  . METHOCARBAMOL 500 MG PO TABS Oral Take 1 tablet (500 mg total) by mouth 2 (two) times daily. 20 tablet 0  . TRAMADOL HCL 50 MG PO TABS Oral Take 1 tablet (50 mg total) by mouth every 6 (six) hours as needed for pain. 15 tablet 0  . ZOLPIDEM TARTRATE 10 MG PO TABS Oral Take 1 tablet (10 mg total) by mouth at bedtime as needed for sleep. 30 tablet 1  . ZOLPIDEM TARTRATE 10 MG PO TABS Oral Take 1 tablet (10 mg total) by mouth at bedtime as needed for sleep. 30 tablet 0    BP 150/85  Pulse 101  Temp(Src) 98.9 F (37.2 C) (Oral)  Resp 18  SpO2 98%  Physical Exam  Nursing note and vitals reviewed. Constitutional: She is  oriented to person, place, and time. She appears well-developed and well-nourished. No distress.  HENT:  Head: Normocephalic and atraumatic.  Mouth/Throat: Oropharynx is clear and moist. No oropharyngeal exudate.  Eyes: EOM are normal. Pupils are equal, round, and reactive to light.  Neck: Normal range of motion. Neck supple.  Cardiovascular: Normal rate and regular rhythm.   Pulmonary/Chest: Effort normal and breath sounds normal. No respiratory distress. She has no wheezes. She has no rales. She exhibits no tenderness.  Abdominal: Soft. Bowel sounds are normal. There is tenderness (mild generalized TTP w/o focality or peritoneal signs). There is no rebound and no guarding.  Musculoskeletal: Normal range of motion. She exhibits tenderness (Mild tenderness to palpation over R posterior thoracic region. No obvious trauma. ). She exhibits no edema.  Neurological: She is alert and oriented to person, place, and time.  Skin: Skin is warm and dry. No rash noted. No erythema.  Psychiatric: She has a normal mood and affect. Her behavior is normal.    ED Course  Procedures (including critical care time)  Labs Reviewed  URINALYSIS, ROUTINE W REFLEX MICROSCOPIC - Abnormal; Notable for the following:    Hgb urine dipstick SMALL (*)    All other components within normal limits  CBC - Abnormal; Notable for the following:    Platelets 467 (*)    All other components within normal limits  DIFFERENTIAL - Abnormal; Notable for the following:    Lymphs Abs 4.2 (*)    All other components within normal limits  COMPREHENSIVE METABOLIC PANEL - Abnormal; Notable for the following:    Glucose, Bld 124 (*)    ALT 36 (*)    Total Bilirubin 0.2 (*)    All other components within normal limits  URINE MICROSCOPIC-ADD ON - Abnormal; Notable for the following:    Squamous Epithelial / LPF FEW (*)    All other components within normal limits  PREGNANCY, URINE  LIPASE, BLOOD   Dg Chest 2 View  10/23/2011   *RADIOLOGY REPORT*  Clinical Data: Cough, chest pain  CHEST - 2 VIEW  Comparison: 03/18/2009  Findings: Right cervical rib incidentally noted. Cardiomediastinal silhouette is within normal limits. The lungs are clear. No pleural effusion.  No pneumothorax.  No acute osseous abnormality. Cholecystectomy clips noted.  IMPRESSION: No acute cardiopulmonary process.  Original Report Authenticated By: Harrel Lemon, M.D.     1. Bronchitis   2. Muscle strain       MDM   Pt with improved symptoms asking for d/c.  Loren Racer, MD 10/23/11 2257

## 2011-11-08 ENCOUNTER — Emergency Department (HOSPITAL_COMMUNITY)
Admission: EM | Admit: 2011-11-08 | Discharge: 2011-11-08 | Disposition: A | Discharge status: Home / Self Care | Payer: Self-pay | Attending: Emergency Medicine | Admitting: Emergency Medicine

## 2011-11-08 ENCOUNTER — Emergency Department (HOSPITAL_COMMUNITY): Payer: Self-pay

## 2011-11-08 ENCOUNTER — Encounter (HOSPITAL_COMMUNITY): Payer: Self-pay | Admitting: Emergency Medicine

## 2011-11-08 DIAGNOSIS — K219 Gastro-esophageal reflux disease without esophagitis: Secondary | ICD-10-CM | POA: Insufficient documentation

## 2011-11-08 DIAGNOSIS — Z79899 Other long term (current) drug therapy: Secondary | ICD-10-CM | POA: Insufficient documentation

## 2011-11-08 DIAGNOSIS — R059 Cough, unspecified: Secondary | ICD-10-CM | POA: Insufficient documentation

## 2011-11-08 DIAGNOSIS — R05 Cough: Secondary | ICD-10-CM

## 2011-11-08 DIAGNOSIS — IMO0001 Reserved for inherently not codable concepts without codable children: Secondary | ICD-10-CM | POA: Insufficient documentation

## 2011-11-08 DIAGNOSIS — R1110 Vomiting, unspecified: Secondary | ICD-10-CM | POA: Insufficient documentation

## 2011-11-08 DIAGNOSIS — F411 Generalized anxiety disorder: Secondary | ICD-10-CM | POA: Insufficient documentation

## 2011-11-08 DIAGNOSIS — R109 Unspecified abdominal pain: Secondary | ICD-10-CM | POA: Insufficient documentation

## 2011-11-08 LAB — CBC
HCT: 36.8 % (ref 36.0–46.0)
Hemoglobin: 12.2 g/dL (ref 12.0–15.0)
MCH: 29.7 pg (ref 26.0–34.0)
RBC: 4.11 MIL/uL (ref 3.87–5.11)

## 2011-11-08 LAB — COMPREHENSIVE METABOLIC PANEL
Alkaline Phosphatase: 106 U/L (ref 39–117)
BUN: 6 mg/dL (ref 6–23)
Chloride: 101 mEq/L (ref 96–112)
GFR calc Af Amer: >90 mL/min (ref >90)
Glucose, Bld: 110 mg/dL — ABNORMAL HIGH (ref 70–99)
Potassium: 3.6 mEq/L (ref 3.5–5.1)
Total Bilirubin: 0.3 mg/dL (ref 0.3–1.2)

## 2011-11-08 LAB — PRO B NATRIURETIC PEPTIDE: Pro B Natriuretic peptide (BNP): 32.7 pg/mL (ref 0–125)

## 2011-11-08 LAB — DIFFERENTIAL
Eosinophils Absolute: 0.2 10*3/uL (ref 0.0–0.7)
Lymphs Abs: 3.8 10*3/uL (ref 0.7–4.0)
Monocytes Relative: 7 % (ref 3–12)
Neutro Abs: 4.3 10*3/uL (ref 1.7–7.7)
Neutrophils Relative %: 48 % (ref 43–77)

## 2011-11-08 LAB — TROPONIN I: Troponin I: <0.3 ng/mL (ref <0.30)

## 2011-11-08 MED ORDER — PROMETHAZINE HCL 25 MG PO TABS: 25.0000 mg | ORAL_TABLET | Freq: Four times a day (QID) | ORAL | Status: DC | PRN

## 2011-11-08 MED ORDER — HYDROMORPHONE HCL PF 1 MG/ML IJ SOLN
1.0000 mg | Freq: Once | INTRAMUSCULAR | Status: DC
  Filled 2011-11-08: qty 1

## 2011-11-08 MED ORDER — BENZONATATE 100 MG PO CAPS: 100.0000 mg | ORAL_CAPSULE | Freq: Three times a day (TID) | ORAL | Status: AC

## 2011-11-08 MED ORDER — HYDROCODONE-ACETAMINOPHEN 5-500 MG PO TABS: 1.0000 | ORAL_TABLET | Freq: Four times a day (QID) | ORAL | Status: AC | PRN

## 2011-11-08 MED ORDER — SODIUM CHLORIDE 0.9 % IV SOLN: Freq: Once | INTRAVENOUS | Status: DC

## 2011-11-08 MED ORDER — MORPHINE SULFATE 4 MG/ML IJ SOLN
4.0000 mg | Freq: Once | INTRAMUSCULAR | Status: AC
  Administered 2011-11-08: 4 mg via INTRAVENOUS
  Filled 2011-11-08: qty 1

## 2011-11-08 MED ORDER — ONDANSETRON HCL 4 MG/2ML IJ SOLN
4.0000 mg | Freq: Once | INTRAMUSCULAR | Status: AC
  Administered 2011-11-08: 4 mg via INTRAVENOUS
  Filled 2011-11-08: qty 2

## 2011-11-08 NOTE — ED Notes (Signed)
Pt sts pain in her stomach, frequent urination, coughing x 2 months, clear sputum production with coughing, pain with urination and unquenchable thirst.

## 2011-11-08 NOTE — ED Notes (Signed)
Patient given discharge instructions, information, prescriptions, and diet order. Patient states that they adequately understand discharge information given and to return to ED if symptoms return or worsen.     

## 2011-11-08 NOTE — Discharge Instructions (Signed)

## 2011-11-08 NOTE — ED Provider Notes (Signed)
History     CSN: [id number]  Arrival date & time 11/08/11  5621   First MD Initiated Contact with Patient 11/08/11 1940      Chief Complaint  Patient presents with  . Emesis  . Cough    (Consider location/radiation/quality/duration/timing/severity/associated sxs/prior treatment) HPI Comments: Patient with history of Fibromyalgia.  Presents with 2 month history of cough so severe that her abdomen is hurting.  She has been to the ER for this but no cause has been found.  It has been suggested that it is related to her lisinopril may be the cause, but patient does not believe it to be so because she mixed her medications up and has not been on it for weeks.     Patient is a 51 y.o. female presenting with vomiting. The history is provided by the patient.  Emesis  This is a recurrent problem. Episode onset: 2 months ago. The problem occurs continuously. The problem has been gradually worsening. The emesis has an appearance of stomach contents. There has been no fever.    Past Medical History  Diagnosis Date  . Anxiety   . Chronic pain   . Acid reflux disease   . Migraine   . Sleep apnea   . Fibromyalgia     Past Surgical History  Procedure Date  . Cholecystectomy     2008  . Total abdominal hysterectomy     1986    Family History  Problem Relation Age of Onset  . Diabetes Mother   . Diabetes Sister   . Hypertension Mother   . Hypertension Father   . Cancer Father     Prostate  . Cancer Brother     Prostate  . Depression Mother   . Alcohol abuse Father     History  Substance Use Topics  . Smoking status: Never Smoker   . Smokeless tobacco: Not on file  . Alcohol Use: No    OB History    Grav Para Term Preterm Abortions TAB SAB Ect Mult Living                  Review of Systems  Gastrointestinal: Positive for vomiting.  All other systems reviewed and are negative.    Allergies  Review of patient's allergies indicates no known allergies.  Home  Medications   Current Outpatient Rx  Name Route Sig Dispense Refill  . ASPIRIN 81 MG PO CHEW Oral Chew 81 mg by mouth daily.    . DULOXETINE HCL 20 MG PO CPEP Oral Take 40 mg by mouth at bedtime.    Marland Kitchen ESOMEPRAZOLE MAGNESIUM 20 MG PO CPDR Oral Take 1 capsule (20 mg total) by mouth daily before breakfast. 30 capsule 5  . LISINOPRIL-HYDROCHLOROTHIAZIDE 10-12.5 MG PO TABS Oral Take 1 tablet by mouth daily. 30 tablet 5  . ONDANSETRON HCL 4 MG PO TABS Oral Take 1 tablet (4 mg total) by mouth daily as needed for nausea. 30 tablet 1  . SIMVASTATIN 10 MG PO TABS Oral Take 1 tablet (10 mg total) by mouth at bedtime. 90 tablet 3  . ZOLPIDEM TARTRATE 10 MG PO TABS Oral Take 1 tablet (10 mg total) by mouth at bedtime as needed for sleep. 30 tablet 1  . ZOLPIDEM TARTRATE 10 MG PO TABS Oral Take 1 tablet (10 mg total) by mouth at bedtime as needed for sleep. 30 tablet 0  . ZOLPIDEM TARTRATE 5 MG PO TABS Oral Take 5 mg by mouth at bedtime as  needed.      BP 180/115  Pulse 108  Temp 98 F (36.7 C)  Resp 16  SpO2 99%  Physical Exam  Nursing note and vitals reviewed. Constitutional: She is oriented to person, place, and time. She appears well-developed and well-nourished. No distress.  HENT:  Head: Normocephalic and atraumatic.  Neck: Normal range of motion. Neck supple.  Cardiovascular: Normal rate and regular rhythm.  Exam reveals no gallop and no friction rub.   No murmur heard. Pulmonary/Chest: Effort normal and breath sounds normal. No respiratory distress. She has no wheezes.  Abdominal: Soft. Bowel sounds are normal. She exhibits no distension. There is no tenderness.  Musculoskeletal: Normal range of motion.  Neurological: She is alert and oriented to person, place, and time.  Skin: Skin is warm and dry. She is not diaphoretic.    ED Course  Procedures (including critical care time)  Labs Reviewed - No data to display No results found.   No diagnosis found.    MDM  The patient's  complaints are coughing that causes her to vomit so much her abdomen hurts.  I performed a workup into this but did not find a cause.  The labs, xray, ekg do not show a cause.  At this point, I will discharge to home with follow up with her pcp.  It does not appear as though there is an acute process.  I will prescribe medication for her pain and cough and nausea.        Geoffery Lyons, MD 11/08/11 2232

## 2011-11-08 NOTE — ED Notes (Signed)
Pt alert, nad, arrives from home, c/o cough, emesis, frequent urination, resp even unlabored, skin pwd, denies pain with urination

## 2011-11-09 ENCOUNTER — Other Ambulatory Visit: Payer: Self-pay | Admitting: Family Medicine

## 2011-11-10 NOTE — Telephone Encounter (Signed)
This refill has been printed. Patient can pick it up at front desk. Please let her know.  Thank you! Anzal Bartnick M. Gurvir Schrom, M.D.

## 2011-12-25 ENCOUNTER — Ambulatory Visit (INDEPENDENT_AMBULATORY_CARE_PROVIDER_SITE_OTHER): Payer: Self-pay | Admitting: Family Medicine

## 2011-12-25 ENCOUNTER — Encounter: Payer: Self-pay | Admitting: Family Medicine

## 2011-12-25 VITALS — BP 160/112 | HR 98 | Ht 60.0 in | Wt 178.0 lb

## 2011-12-25 DIAGNOSIS — M797 Fibromyalgia: Secondary | ICD-10-CM

## 2011-12-25 DIAGNOSIS — I10 Essential (primary) hypertension: Secondary | ICD-10-CM

## 2011-12-25 DIAGNOSIS — F411 Generalized anxiety disorder: Secondary | ICD-10-CM

## 2011-12-25 DIAGNOSIS — F419 Anxiety disorder, unspecified: Secondary | ICD-10-CM

## 2011-12-25 DIAGNOSIS — IMO0001 Reserved for inherently not codable concepts without codable children: Secondary | ICD-10-CM

## 2011-12-25 MED ORDER — ALPRAZOLAM 1 MG PO TABS: 0.5000 mg | ORAL_TABLET | Freq: Three times a day (TID) | ORAL | Status: DC | PRN

## 2011-12-25 MED ORDER — SIMVASTATIN 10 MG PO TABS: 10.0000 mg | ORAL_TABLET | Freq: Every day | ORAL | Status: DC

## 2011-12-25 MED ORDER — DICLOFENAC POTASSIUM 50 MG PO TABS: 50.0000 mg | ORAL_TABLET | Freq: Three times a day (TID) | ORAL | Status: DC

## 2011-12-25 MED ORDER — LISINOPRIL-HYDROCHLOROTHIAZIDE 10-12.5 MG PO TABS: 1.0000 | ORAL_TABLET | Freq: Every day | ORAL | Status: DC

## 2011-12-25 MED ORDER — VENLAFAXINE HCL 50 MG PO TABS: 50.0000 mg | ORAL_TABLET | Freq: Two times a day (BID) | ORAL | Status: DC

## 2011-12-25 NOTE — Progress Notes (Signed)
  Subjective:    Patient ID: Sherry [name], female    DOB: [birth year], 51 y.o.   MRN: [id number]  HPI  Patient is a 51 year old female presenting to the office today for followup. Patient's daughter is present with her in exam room.  #1-Hypertension: Pt blood pressure elevated in clinic today. She states she has not been taking her blood pressure medications for over one month now. She has also had increased stress in her life from her family and other social matters. Although she is concerned about her blood pressure she states that these external forces are making her blood pressure worse. She denies headaches, changes in vision, chest pain, shortness of breath, leg edema. Patient and I discussed the negative affects of hypertension on her body. She is willing to take her lisinopril HCTZ but she feels anxiety is playing a large component in her pressure.  #2- Anxiety: Patient has had multiple deaths in her family recently. She does have a funeral coming up in a few days and she is very concerned about this. Daughter is present in the room and states that her mother has frequent episodes of hand shaking and what she describes as "anxiety episodes ". Patient is requesting a short-term medication to help her get through the funeral and to help calm her down. Patient denies depression, suicidal ideation or homicidal ideation. She is able to leave her house with no difficulties. She has had anxiety in the past. She is not sure what medication helped her the most, as you know she has taken Xanax before.  #3- Pain: Patient has history of fibromyalgia. She has been taken Cymbalta for her pain as well as for her anxiety/depression. Overall she states this did decrease her pain but she has been having negative side effects including nausea, vomiting and a persistent cough. Patient stopped taking the Cymbalta proximally one month ago and the symptoms resolved. Her pain is rated a 4/10 today worst in her legs  and feet. She is interested in trying another medication similar to the Cymbalta but would also like a pain medication for short-term relief. She denies any falls, numbness or tingling. She denies any fevers or chills. She does continue to take Flexeril as needed.  History reviewed: Never smoker  Review of Systems Please see history of present illness above    Objective:   Physical Exam  Constitutional: She is oriented to person, place, and time. She appears well-developed and well-nourished. No distress.  HENT:  Head: Normocephalic and atraumatic.  Cardiovascular: Normal rate, regular rhythm and normal heart sounds.   No murmur heard. Pulmonary/Chest: Breath sounds normal. No respiratory distress.  Abdominal: Soft. There is no tenderness.  Neurological: She is alert and oriented to person, place, and time.  Skin: She is not diaphoretic.      Assessment & Plan:   Patient is a 51 year old female presenting to the office today for followup.

## 2011-12-25 NOTE — Patient Instructions (Signed)
It was good to see you today. I am sorry you have been feeling bad. I hope things improve soon.   I have sent your blood pressure and cholesterol medication to [facility]. I have printed your script for anxiety medicine. This is not a long term medicine. Also, [facility]  Please come back to see me in 2 months, or sooner if you need anything.  Azarah Dacy M. Naiyana Barbian, M.D.

## 2011-12-26 NOTE — Assessment & Plan Note (Signed)
A. she has increased anxiety in her life currently. She's had multiple deaths in her family, and has a funeral upcoming soon. Daughter states the patient worries and has frequent hand tremors/anxiety episodes. Patient to continue Effexor (this is a new prescription for her today) but I am prescribing Xanax 1 mg for her to take half to one tablet as needed for anxiety. She was given #30 with 0 refills. Patient knows this is not a long-term medication. I will not prescribe Xanax long-term and patient agrees.

## 2011-12-26 NOTE — Assessment & Plan Note (Signed)
Pain was previously improved with Cymbalta. Patient has been unable to tolerate this medication secondary to vomiting and cough. She is interested in trying another medication similar to Cymbalta. Prescription changed to Effexor 50 mg twice a day. Given printed out [facility]. For her pain we will again discuss that I will not prescribe long-term narcotics for her. Will treat with NSAID. Given prescription for diclofenac tablet to take as needed. Patient agrees with this plan. We'll follow up in one month

## 2011-12-26 NOTE — Assessment & Plan Note (Signed)
Blood pressure very high today in office. Patient is under a lot of stress, and has not been taking her blood pressure medications. She denies any red flag symptoms at this time. Will refill lisinopril/HCTZ and encourage her to begin taking this today. Also encouraged her to decrease the stress in her life. Patient will return to clinic for blood pressure check in one week, and return to clinic for a followup appointment with me in one month. Continue aspirin and Zocor as previously prescribed.

## 2012-01-19 ENCOUNTER — Other Ambulatory Visit: Payer: Self-pay | Admitting: Family Medicine

## 2012-01-19 MED ORDER — VENLAFAXINE HCL ER 37.5 MG PO CP24: 37.5000 mg | ORAL_CAPSULE | Freq: Every day | ORAL | Status: DC

## 2012-01-25 ENCOUNTER — Other Ambulatory Visit: Payer: Self-pay | Admitting: Family Medicine

## 2012-01-25 MED ORDER — VENLAFAXINE HCL ER 37.5 MG PO CP24: 37.5000 mg | ORAL_CAPSULE | Freq: Every day | ORAL | Status: DC

## 2012-01-27 ENCOUNTER — Inpatient Hospital Stay (HOSPITAL_COMMUNITY)
Admission: AD | Admit: 2012-01-27 | Discharge: 2012-01-27 | Disposition: A | Discharge status: Home / Self Care | Payer: Self-pay | Source: Ambulatory Visit | Attending: Obstetrics & Gynecology | Admitting: Obstetrics & Gynecology

## 2012-01-27 ENCOUNTER — Encounter (HOSPITAL_COMMUNITY): Payer: Self-pay | Admitting: *Deleted

## 2012-01-27 DIAGNOSIS — A5901 Trichomonal vulvovaginitis: Secondary | ICD-10-CM | POA: Insufficient documentation

## 2012-01-27 DIAGNOSIS — N949 Unspecified condition associated with female genital organs and menstrual cycle: Secondary | ICD-10-CM | POA: Insufficient documentation

## 2012-01-27 LAB — WET PREP, GENITAL: Yeast Wet Prep HPF POC: NONE SEEN

## 2012-01-27 MED ORDER — METRONIDAZOLE 500 MG PO TABS
2000.0000 mg | ORAL_TABLET | Freq: Once | ORAL | Status: AC
  Administered 2012-01-27: 2000 mg via ORAL
  Filled 2012-01-27: qty 4

## 2012-01-27 NOTE — MAU Note (Signed)
Patient states she has had a vaginal discharge with itching for a couple of day. No pain or bleeding.
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
## 2012-01-28 LAB — GC/CHLAMYDIA PROBE AMP, URINE: Chlamydia, Swab/Urine, PCR: NEGATIVE

## 2012-02-05 ENCOUNTER — Telehealth: Payer: Self-pay | Admitting: Family Medicine

## 2012-02-05 ENCOUNTER — Other Ambulatory Visit: Payer: Self-pay | Admitting: Family Medicine

## 2012-02-05 DIAGNOSIS — I10 Essential (primary) hypertension: Secondary | ICD-10-CM

## 2012-02-05 MED ORDER — LISINOPRIL-HYDROCHLOROTHIAZIDE 10-12.5 MG PO TABS: 1.0000 | ORAL_TABLET | Freq: Every day | ORAL | Status: DC

## 2012-02-05 MED ORDER — ONDANSETRON HCL 4 MG PO TABS: 4.0000 mg | ORAL_TABLET | Freq: Every day | ORAL | Status: DC | PRN

## 2012-02-05 MED ORDER — SIMVASTATIN 10 MG PO TABS: 10.0000 mg | ORAL_TABLET | Freq: Every day | ORAL | Status: DC

## 2012-02-05 NOTE — Telephone Encounter (Signed)
[facility] on [address] has sent refill requests for Simvastatin, Zofran and Lisinopril several days ago but has not heard anything back.  She is completely out of her medication.

## 2012-02-05 NOTE — Telephone Encounter (Signed)
Attempted call back to patient . Voicemail was full and could not leave a message.

## 2012-02-05 NOTE — Telephone Encounter (Signed)
Will send message to MD

## 2012-02-05 NOTE — Telephone Encounter (Signed)
These have been refilled. You can let pt know. Chistopher Mangino M. Mayte Diers, M.D.

## 2012-02-09 ENCOUNTER — Telehealth: Payer: Self-pay | Admitting: Family Medicine

## 2012-02-09 MED ORDER — PROMETHAZINE HCL 25 MG PO TABS: 12.5000 mg | ORAL_TABLET | Freq: Three times a day (TID) | ORAL | Status: DC | PRN

## 2012-02-09 NOTE — Telephone Encounter (Signed)
Rx sent to [facility]. Thanks! Anastasha Ortez M. Tobi Leinweber, M.D.

## 2012-02-09 NOTE — Telephone Encounter (Signed)
I do not feel that I am able to write this letter for the patient unless she is evaluated in the clinic. Please have her schedule an appointment for a same day visit to get a doctor's note or she can have the company she is taking the test with send paper work? I am sorry. Thank you- Aleja Yearwood M. Hien Cunliffe, M.D.

## 2012-02-09 NOTE — Telephone Encounter (Signed)
Pt is not able to ride to [place] to take a test for insurance - she is hurting too much to be able to ride that distance and needs a note to be able to retake the test at a later time.

## 2012-02-09 NOTE — Telephone Encounter (Signed)
Patient is calling because she needs a refill on Phenergan sent to [facility] on [address].  There is an Rx there for Zofran, but she doesn't take that medication anymore.

## 2012-02-09 NOTE — Telephone Encounter (Signed)
Pt called back and message was given. 

## 2012-02-18 ENCOUNTER — Ambulatory Visit: Payer: Self-pay | Admitting: Family Medicine

## 2012-03-01 ENCOUNTER — Ambulatory Visit: Payer: Self-pay | Admitting: Family Medicine

## 2012-03-30 ENCOUNTER — Other Ambulatory Visit: Payer: Self-pay | Admitting: Family Medicine

## 2012-04-21 ENCOUNTER — Other Ambulatory Visit: Payer: Self-pay | Admitting: Family Medicine

## 2012-04-21 MED ORDER — ZOLPIDEM TARTRATE 10 MG PO TABS: 10.0000 mg | ORAL_TABLET | Freq: Every evening | ORAL | Status: DC | PRN

## 2012-04-26 ENCOUNTER — Other Ambulatory Visit: Payer: Self-pay | Admitting: Family Medicine

## 2012-04-26 MED ORDER — ESOMEPRAZOLE MAGNESIUM 20 MG PO CPDR: 20.0000 mg | DELAYED_RELEASE_CAPSULE | Freq: Every day | ORAL | Status: DC

## 2012-04-27 ENCOUNTER — Encounter: Payer: Self-pay | Admitting: Family Medicine

## 2012-04-27 ENCOUNTER — Ambulatory Visit (INDEPENDENT_AMBULATORY_CARE_PROVIDER_SITE_OTHER): Payer: Self-pay | Admitting: Family Medicine

## 2012-04-27 VITALS — BP 149/91 | HR 111 | Temp 98.4°F | Ht 61.0 in | Wt 175.0 lb

## 2012-04-27 DIAGNOSIS — Z23 Encounter for immunization: Secondary | ICD-10-CM

## 2012-04-27 DIAGNOSIS — F419 Anxiety disorder, unspecified: Secondary | ICD-10-CM

## 2012-04-27 DIAGNOSIS — IMO0001 Reserved for inherently not codable concepts without codable children: Secondary | ICD-10-CM

## 2012-04-27 DIAGNOSIS — M797 Fibromyalgia: Secondary | ICD-10-CM

## 2012-04-27 DIAGNOSIS — I10 Essential (primary) hypertension: Secondary | ICD-10-CM

## 2012-04-27 DIAGNOSIS — F411 Generalized anxiety disorder: Secondary | ICD-10-CM

## 2012-04-27 MED ORDER — LISINOPRIL-HYDROCHLOROTHIAZIDE 10-12.5 MG PO TABS: 1.0000 | ORAL_TABLET | Freq: Every day | ORAL | Status: DC

## 2012-04-27 MED ORDER — LISINOPRIL-HYDROCHLOROTHIAZIDE 20-25 MG PO TABS: 1.0000 | ORAL_TABLET | Freq: Every day | ORAL | Status: DC

## 2012-04-27 MED ORDER — ZOLPIDEM TARTRATE 10 MG PO TABS: 10.0000 mg | ORAL_TABLET | Freq: Every evening | ORAL | Status: DC | PRN

## 2012-04-27 MED ORDER — ALPRAZOLAM 1 MG PO TABS: 0.5000 mg | ORAL_TABLET | Freq: Three times a day (TID) | ORAL | Status: DC | PRN

## 2012-04-27 MED ORDER — SIMVASTATIN 10 MG PO TABS: 10.0000 mg | ORAL_TABLET | Freq: Every day | ORAL | Status: DC

## 2012-04-27 MED ORDER — CYCLOBENZAPRINE HCL 10 MG PO TABS: 10.0000 mg | ORAL_TABLET | Freq: Three times a day (TID) | ORAL | Status: DC | PRN

## 2012-04-27 NOTE — Progress Notes (Signed)
Patient ID: Sherry [name], female   DOB: 10-25-1960, 52 y.o.   MRN: [id number] [facility] Gill Delrossi M. Loic Hobin, MD Phone: [phone]  Subjective: HPI: Patient is a 51 y.o. female presenting to clinic today for follow up. Concerns today include pain from fibromyalgia and anxiety.  1. FM- Pain getting worse. Could not tolerate Cymbalta (hair loss and nausea) and could not afford Effexor. Would like referral for pain management. Patient states that her life is affected everyday in every way. She was previously on pain medication before she started coming to [facility], and it was established that I would not prescribe these medications and that it does not treat FM. Patient states she has some HA, pain in all joints, cold feet/legs.   2. Anxiety- Per patient and her daughter, this is greatly worsening. Not using anything for anxiety at this point in time. Was on Cymbalta and short term benzo . She is requesting something today. Affects her every day, and she does have panic attacks. No dysphoric mood, SI/HI.   3. HTN- Above goal today, taking medicines every morning. HA on occasion. Reports some CP but not related to BP, no SOB. No swelling in legs. Does need eye appointment. States her legs are always cold.  History Reviewed: Non-smoker. Health Maintenance: Needs flu shot today  ROS: Please see HPI above.  Objective: Office vital signs reviewed.  Physical Examination:  General: Awake, alert. NAD. Daughter at bedside HEENT: Atraumatic, normocephalic Pulm: CTAB, no wheezes Cardio: RRR, no murmurs appreciated Abdomen: soft, nontender, nondistended Extremities: No edema, warm to touch. TTP of shoulders, arms and legs. Neuro: Grossly intact  Assessment: 51 yo F presenting for follow up  Plan: See Problem List and After Visit Summary

## 2012-04-27 NOTE — Patient Instructions (Signed)
It was nice to see you today.  I have sent in your refills for you.  We will call you with your appointment for pain management.  You take care and have a happy thanksgiving! Amber M. Hairford, M.D.

## 2012-04-27 NOTE — Assessment & Plan Note (Signed)
BP has been above goal at multiple appointments. Will increase Lisinopril to 20-25mg  and follow up in 6 weeks. Given red flag symptoms that should prompt her return.

## 2012-04-27 NOTE — Assessment & Plan Note (Signed)
Long history of anxiety. Does not want to go back on Cymbalta or Effexor. Will treat with small dose Xanax as needed for now. Will continue to encourage patient to see Dr. Pascal Lux or mental health provider. RTC in 6-8 weeks for follow up.

## 2012-04-27 NOTE — Assessment & Plan Note (Signed)
Patient requesting referral to pain management. It has been established that we do not prescribe chronic narcotics here, and we have also discussed that narcotics do not treat FM. Precepted with Dr. Earnest Bailey, and although this is not ideal treatment, I feel this will improve her quality of life. I do not think the pain management clinic will over prescribe medications, and perhaps other therapies they offer will help. I did not promise anything for patient, and she understands that. Will place referral, and follow up closely with patient. For now, continue using NSAIDs for treatment.

## 2012-05-02 ENCOUNTER — Encounter: Payer: Self-pay | Admitting: Physical Medicine & Rehabilitation

## 2012-05-20 ENCOUNTER — Other Ambulatory Visit: Payer: Self-pay | Admitting: Family Medicine

## 2012-06-10 ENCOUNTER — Other Ambulatory Visit: Payer: Self-pay | Admitting: Family Medicine

## 2012-06-10 MED ORDER — DULOXETINE HCL 20 MG PO CPEP: 40.0000 mg | ORAL_CAPSULE | Freq: Every day | ORAL | Status: DC

## 2012-06-21 ENCOUNTER — Other Ambulatory Visit: Payer: Self-pay | Admitting: Family Medicine

## 2012-07-07 ENCOUNTER — Encounter: Payer: Self-pay | Admitting: Family Medicine

## 2012-07-07 ENCOUNTER — Ambulatory Visit (INDEPENDENT_AMBULATORY_CARE_PROVIDER_SITE_OTHER): Payer: Self-pay | Admitting: Family Medicine

## 2012-07-07 VITALS — BP 158/95 | HR 114 | Temp 98.6°F | Ht 61.0 in | Wt 176.0 lb

## 2012-07-07 DIAGNOSIS — M79609 Pain in unspecified limb: Secondary | ICD-10-CM

## 2012-07-07 DIAGNOSIS — IMO0001 Reserved for inherently not codable concepts without codable children: Secondary | ICD-10-CM

## 2012-07-07 DIAGNOSIS — I10 Essential (primary) hypertension: Secondary | ICD-10-CM

## 2012-07-07 DIAGNOSIS — F411 Generalized anxiety disorder: Secondary | ICD-10-CM

## 2012-07-07 DIAGNOSIS — M797 Fibromyalgia: Secondary | ICD-10-CM

## 2012-07-07 DIAGNOSIS — F419 Anxiety disorder, unspecified: Secondary | ICD-10-CM

## 2012-07-07 DIAGNOSIS — M79673 Pain in unspecified foot: Secondary | ICD-10-CM

## 2012-07-07 MED ORDER — ALPRAZOLAM 1 MG PO TABS: 0.5000 mg | ORAL_TABLET | Freq: Three times a day (TID) | ORAL | Status: DC | PRN

## 2012-07-07 MED ORDER — LISINOPRIL-HYDROCHLOROTHIAZIDE 20-25 MG PO TABS: 1.0000 | ORAL_TABLET | Freq: Every day | ORAL | Status: DC

## 2012-07-07 MED ORDER — KETOROLAC TROMETHAMINE 30 MG/ML IJ SOLN
30.0000 mg | Freq: Once | INTRAMUSCULAR | Status: AC
  Administered 2012-07-07: 30 mg via INTRAMUSCULAR

## 2012-07-07 MED ORDER — ALPRAZOLAM 1 MG PO TABS: 0.5000 mg | ORAL_TABLET | Freq: Three times a day (TID) | ORAL | Status: AC | PRN

## 2012-07-07 MED ORDER — SIMVASTATIN 10 MG PO TABS: 10.0000 mg | ORAL_TABLET | Freq: Every day | ORAL | Status: DC

## 2012-07-07 MED ORDER — CYCLOBENZAPRINE HCL 10 MG PO TABS: 10.0000 mg | ORAL_TABLET | Freq: Three times a day (TID) | ORAL | Status: DC | PRN

## 2012-07-07 MED ORDER — GABAPENTIN 100 MG PO CAPS: 100.0000 mg | ORAL_CAPSULE | Freq: Three times a day (TID) | ORAL | Status: DC

## 2012-07-07 NOTE — Assessment & Plan Note (Signed)
BP is not at goal today. Pt states she lost her medication on a recent trip out of town and is pain today. Will restart medication (refills sent) and she should keep track of her BP. RTC in one month for follow up. Will need labs soon (next visit?). Continue ASA and Zocor.

## 2012-07-07 NOTE — Patient Instructions (Signed)
I have refilled your medication. Continue using the creams and heat as well.  I have also prescribed Gabapentin 100mg  three times daily. Start with one tab per day and work up to 3 times as needed.   I will see you back in 3 months, or sooner if needed.  Emmalia Heyboer M. Ekansh Sherk, M.D.

## 2012-07-07 NOTE — Assessment & Plan Note (Signed)
Appears well controlled at this time. Given Refill on Xanax. Still encouraged pt to seek mental health.

## 2012-07-07 NOTE — Assessment & Plan Note (Signed)
Neurovascularly intact. Will start Gabapentin 100mg  daily, with increase to TID as tolerated. Pt will follow up with me in one month, and also see pain management in March.

## 2012-07-07 NOTE — Progress Notes (Signed)
Patient ID: [name], female   DOB: 1961-04-29, 52 y.o.   MRN: [id number]  [facility] Willamina Grieshop M. Recardo Linn, MD Phone: [phone]   Subjective: HPI: Patient is a 52 y.o. female presenting to clinic today for follow up appointment. Concerns today include cold feet,   1. Hypertension Blood pressure at home: Does not check Blood pressure today: 158/95 Taking Meds: States she is taking but left it when she went out of town. Side effects: None ROS: Denies headache, visual changes, nausea, vomiting, chest pain, abdominal pain or shortness of breath.  2. Chronic pain- At last visit, was referred for pain management. Has appointment in March 10 because of the orange card expiring. Daughter is concerned because she has been in so much pain. Cold weather makes it worse. Ibuprofen isn't working well. Still using Flexeril. Using heating pad for feet.  3. Anxiety- Started on low dose Benzo at last visit. Needs refill. States it was working well for her. No thoughts about SI/HI.   4. Cold feet- Both stay cold all the time even with heating pad. Been getting worse this winter with the cold weather. Bottom of feet buring. Has tried Lyrica in the past and states she had "a bad reaction to it" Unsure if she has ever taken Gabapentin.   History Reviewed: Non-smoker. Health Maintenance: UTD flu shot. Last mammogram in July 2011. Last pap smear unsure.   ROS: Please see HPI above.  Objective: Office vital signs reviewed. There were no vitals taken for this visit.  Physical Examination:  General: Awake, alert. Appears uncomfortable HEENT: Atraumatic, normocephalic. MMM Neck: No masses palpated. No LAD. No bruits Pulm: CTAB, no wheezes Cardio: RRR, no murmurs appreciated Abdomen:+BS, soft, nontender, nondistended Back: No spinal TTP. Does have area of tension under left scapula but not able to find a trigger point Extremities: No edema. Pulses intact bilaterally. Good  ROM of feet Neuro: Grossly intact, sensation intact of lower extremities.  Assessment: 52 y.o. female Follow up appointment  Plan: See Problem List and After Visit Summary

## 2012-07-07 NOTE — Assessment & Plan Note (Signed)
Pain is not well controlled at this time. Not able to tolerate cymbalta or lyrica. Unable to afford Effexor. Taking OTC medications without help. Given foot pain, I have prescribed Gabapentin to try. She should take 100mg  daily and work up to 3 times daily. She has appt with pain management in March which she should keep. Until then, heat, massage and OTC medications.

## 2012-07-11 ENCOUNTER — Encounter: Payer: Self-pay | Admitting: Physical Medicine & Rehabilitation

## 2012-07-19 ENCOUNTER — Other Ambulatory Visit: Payer: Self-pay | Admitting: Family Medicine

## 2012-07-19 MED ORDER — VENLAFAXINE HCL ER 37.5 MG PO CP24: 37.5000 mg | ORAL_CAPSULE | Freq: Every day | ORAL | Status: DC

## 2012-07-26 ENCOUNTER — Ambulatory Visit: Payer: Self-pay | Admitting: Family Medicine

## 2012-07-27 ENCOUNTER — Telehealth: Payer: Self-pay | Admitting: *Deleted

## 2012-07-27 NOTE — Telephone Encounter (Signed)
[name] with [facility] left message on Pharmacy line.  States they received Rx for Effexor XR for [name] and according to their record patient is also on Cymbalta.  Wanting to verify if patient should be taking both.  Per Dr. Algis Downs last office note patient did not tolerate Cymbalta.  Left message for [name] at [facility] that patient did not tolerate Cymbalta and should just be on the Effexor XR.  Sherry [name]

## 2012-08-15 ENCOUNTER — Encounter: Payer: Self-pay | Attending: Physical Medicine & Rehabilitation | Admitting: Physical Medicine & Rehabilitation

## 2012-08-18 ENCOUNTER — Other Ambulatory Visit: Payer: Self-pay | Admitting: Family Medicine

## 2012-08-22 ENCOUNTER — Other Ambulatory Visit: Payer: Self-pay | Admitting: *Deleted

## 2012-08-22 MED ORDER — CYCLOBENZAPRINE HCL 10 MG PO TABS: 10.0000 mg | ORAL_TABLET | Freq: Three times a day (TID) | ORAL | Status: DC | PRN

## 2012-08-24 ENCOUNTER — Inpatient Hospital Stay (HOSPITAL_COMMUNITY)
Admission: AD | Admit: 2012-08-24 | Discharge: 2012-08-25 | Disposition: A | Discharge status: Hospice | Payer: Self-pay | Source: Ambulatory Visit | Attending: Obstetrics & Gynecology | Admitting: Obstetrics & Gynecology

## 2012-08-24 DIAGNOSIS — Z90710 Acquired absence of both cervix and uterus: Secondary | ICD-10-CM | POA: Insufficient documentation

## 2012-08-24 DIAGNOSIS — N760 Acute vaginitis: Secondary | ICD-10-CM | POA: Insufficient documentation

## 2012-08-24 DIAGNOSIS — N949 Unspecified condition associated with female genital organs and menstrual cycle: Secondary | ICD-10-CM | POA: Insufficient documentation

## 2012-08-24 HISTORY — DX: Essential (primary) hypertension: I10

## 2012-08-24 HISTORY — DX: Contact with and (suspected) exposure to infections with a predominantly sexual mode of transmission: Z20.2

## 2012-08-24 NOTE — MAU Note (Signed)
Pt reports she thought she had a yeast infection, painful and burning on the outside with urination. Symptoms x 1 week. Now wants to make sure she doesn't have an STD

## 2012-08-25 ENCOUNTER — Encounter (HOSPITAL_COMMUNITY): Payer: Self-pay | Admitting: *Deleted

## 2012-08-25 DIAGNOSIS — N760 Acute vaginitis: Secondary | ICD-10-CM

## 2012-08-25 LAB — WET PREP, GENITAL

## 2012-08-25 LAB — URINALYSIS, ROUTINE W REFLEX MICROSCOPIC
Bilirubin Urine: NEGATIVE
Nitrite: NEGATIVE
Specific Gravity, Urine: 1.025 (ref 1.005–1.030)
pH: 6 (ref 5.0–8.0)

## 2012-08-25 LAB — URINE MICROSCOPIC-ADD ON

## 2012-08-25 LAB — GC/CHLAMYDIA PROBE AMP: GC Probe RNA: NEGATIVE

## 2012-08-25 MED ORDER — AZITHROMYCIN 250 MG PO TABS
1000.0000 mg | ORAL_TABLET | Freq: Once | ORAL | Status: AC
  Administered 2012-08-25: 1000 mg via ORAL
  Filled 2012-08-25: qty 4

## 2012-08-25 MED ORDER — KETOROLAC TROMETHAMINE 60 MG/2ML IM SOLN
60.0000 mg | Freq: Once | INTRAMUSCULAR | Status: AC
  Administered 2012-08-25: 60 mg via INTRAMUSCULAR
  Filled 2012-08-25: qty 2

## 2012-08-25 MED ORDER — CEFTRIAXONE SODIUM 250 MG IJ SOLR
250.0000 mg | Freq: Once | INTRAMUSCULAR | Status: AC
  Administered 2012-08-25: 250 mg via INTRAMUSCULAR
  Filled 2012-08-25: qty 250
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
## 2012-08-25 NOTE — MAU Note (Signed)
Started with a vag odor a few days ago. Vaginal irritation and burning for the past week.

## 2012-08-26 LAB — URINE CULTURE

## 2012-09-02 ENCOUNTER — Other Ambulatory Visit: Payer: Self-pay | Admitting: Family Medicine

## 2012-09-06 NOTE — Telephone Encounter (Signed)
Prescription is available for patient to pick up at the front desk. We are not able to fax or call this in. Please let her know she can get it at her convenience.  Thanks, [employer]. Hairford, M.D.

## 2012-09-06 NOTE — Telephone Encounter (Signed)
Pt's daughter notified that Rx is available at the front desk.  Emilie Rutter, Darlyne Russian

## 2012-09-08 ENCOUNTER — Telehealth: Payer: Self-pay | Admitting: Family Medicine

## 2012-09-08 NOTE — Telephone Encounter (Signed)
Pt's daughter came in stating that the pharmacy has not received refill for her medication. Daughter states that the pharmacy have sent something three to four time to this office concerning the refill.

## 2012-09-08 NOTE — Telephone Encounter (Signed)
Pt dgt informed that she can come pick up the Rx for ambien (see last phone note). Shaunna Rosetti, Maryjo Rochester

## 2012-11-26 ENCOUNTER — Emergency Department (HOSPITAL_COMMUNITY)
Admission: EM | Admit: 2012-11-26 | Discharge: 2012-11-26 | Disposition: A | Discharge status: Home / Self Care | Payer: Self-pay | Attending: Emergency Medicine | Admitting: Emergency Medicine

## 2012-11-26 ENCOUNTER — Encounter (HOSPITAL_COMMUNITY): Payer: Self-pay | Admitting: *Deleted

## 2012-11-26 DIAGNOSIS — M797 Fibromyalgia: Secondary | ICD-10-CM

## 2012-11-26 DIAGNOSIS — I10 Essential (primary) hypertension: Secondary | ICD-10-CM | POA: Insufficient documentation

## 2012-11-26 DIAGNOSIS — Z7982 Long term (current) use of aspirin: Secondary | ICD-10-CM | POA: Insufficient documentation

## 2012-11-26 DIAGNOSIS — G8929 Other chronic pain: Secondary | ICD-10-CM | POA: Insufficient documentation

## 2012-11-26 DIAGNOSIS — M549 Dorsalgia, unspecified: Secondary | ICD-10-CM | POA: Insufficient documentation

## 2012-11-26 DIAGNOSIS — R110 Nausea: Secondary | ICD-10-CM | POA: Insufficient documentation

## 2012-11-26 DIAGNOSIS — IMO0001 Reserved for inherently not codable concepts without codable children: Secondary | ICD-10-CM | POA: Insufficient documentation

## 2012-11-26 DIAGNOSIS — Z79899 Other long term (current) drug therapy: Secondary | ICD-10-CM | POA: Insufficient documentation

## 2012-11-26 DIAGNOSIS — Z8669 Personal history of other diseases of the nervous system and sense organs: Secondary | ICD-10-CM | POA: Insufficient documentation

## 2012-11-26 DIAGNOSIS — F411 Generalized anxiety disorder: Secondary | ICD-10-CM | POA: Insufficient documentation

## 2012-11-26 DIAGNOSIS — M542 Cervicalgia: Secondary | ICD-10-CM | POA: Insufficient documentation

## 2012-11-26 DIAGNOSIS — K219 Gastro-esophageal reflux disease without esophagitis: Secondary | ICD-10-CM | POA: Insufficient documentation

## 2012-11-26 DIAGNOSIS — Z8619 Personal history of other infectious and parasitic diseases: Secondary | ICD-10-CM | POA: Insufficient documentation

## 2012-11-26 MED ORDER — ONDANSETRON 4 MG PO TBDP
4.0000 mg | ORAL_TABLET | Freq: Once | ORAL | Status: AC
  Administered 2012-11-26: 4 mg via ORAL
  Filled 2012-11-26: qty 1

## 2012-11-26 MED ORDER — HYDROMORPHONE HCL PF 2 MG/ML IJ SOLN
2.0000 mg | Freq: Once | INTRAMUSCULAR | Status: AC
  Administered 2012-11-26: 2 mg via INTRAMUSCULAR
  Filled 2012-11-26: qty 1

## 2012-11-26 MED ORDER — PROMETHAZINE HCL 25 MG PO TABS: 25.0000 mg | ORAL_TABLET | Freq: Four times a day (QID) | ORAL | Status: DC | PRN

## 2012-11-26 MED ORDER — HYDROCODONE-ACETAMINOPHEN 5-325 MG PO TABS: 1.0000 | ORAL_TABLET | Freq: Four times a day (QID) | ORAL | Status: DC | PRN

## 2012-11-26 NOTE — ED Provider Notes (Signed)
History     CSN: [id number]  Arrival date & time 11/26/12  1956   First MD Initiated Contact with Patient 11/26/12 2036      Chief Complaint  Patient presents with  . Generalized Body Aches  . Nausea    (Consider location/radiation/quality/duration/timing/severity/associated sxs/prior treatment) The history is provided by the patient.   52 year old female with history of fibromyalgia followed by outpatient clinics at [facility]. Patient presents for exacerbation of her fibromyalgia with pain being much more severe today hurting all over associated with nausea. This is typical when her fibromyalgia gets bad. Denies any fevers denies vomiting denies other systemic symptoms. Currently the pain is 10 out of 10 described as an ache not made better or worse by anything.  Past Medical History  Diagnosis Date  . Anxiety   . Chronic pain   . Acid reflux disease   . Migraine   . Sleep apnea   . Fibromyalgia   . Hypertension   . Trichomonas contact, treated     Past Surgical History  Procedure Laterality Date  . Cholecystectomy      2008  . Total abdominal hysterectomy      1986  . Wisdom tooth extraction      Family History  Problem Relation Age of Onset  . Diabetes Mother   . Diabetes Sister   . Hypertension Mother   . Hypertension Father   . Cancer Father     Prostate  . Cancer Brother     Prostate  . Depression Mother   . Alcohol abuse Father     History  Substance Use Topics  . Smoking status: Never Smoker   . Smokeless tobacco: Not on file  . Alcohol Use: No    OB History   Grav Para Term Preterm Abortions TAB SAB Ect Mult Living   2 2 2       2       Review of Systems  Constitutional: Negative for fever.  HENT: Positive for neck pain.   Eyes: Negative for redness.  Respiratory: Negative for shortness of breath.   Cardiovascular: Negative for chest pain.  Gastrointestinal: Positive for nausea. Negative for vomiting and abdominal pain.   Musculoskeletal: Positive for myalgias and back pain.  Skin: Negative for rash.  Neurological: Negative for headaches.  Hematological: Does not bruise/bleed easily.  Psychiatric/Behavioral: Negative for confusion.    Allergies  Review of patient's allergies indicates no known allergies.  Home Medications   Current Outpatient Rx  Name  Route  Sig  Dispense  Refill  . acetaminophen (TYLENOL) 500 MG tablet   Oral   Take 500 mg by mouth every 6 (six) hours as needed for pain (pain).         Marland Kitchen ALPRAZolam (XANAX PO)   Oral   Take by mouth.         Marland Kitchen aspirin 81 MG chewable tablet   Oral   Chew 81 mg by mouth daily.         . cyclobenzaprine (FLEXERIL) 10 MG tablet   Oral   Take 1 tablet (10 mg total) by mouth 3 (three) times daily as needed. Muscle spasm   30 tablet   2   . esomeprazole (NEXIUM) 20 MG capsule   Oral   Take 1 capsule (20 mg total) by mouth daily before breakfast.   30 capsule   5   . gabapentin (NEURONTIN) 100 MG capsule   Oral   Take 1 capsule (100 mg  total) by mouth 3 (three) times daily.   90 capsule   3   . lisinopril-hydrochlorothiazide (PRINZIDE,ZESTORETIC) 20-25 MG per tablet   Oral   Take 1 tablet by mouth daily.   90 tablet   3   . simvastatin (ZOCOR) 10 MG tablet   Oral   Take 1 tablet (10 mg total) by mouth at bedtime.   90 tablet   3   . zolpidem (AMBIEN) 10 MG tablet      TAKE 1 TABLET BY MOUTH AT BEDTIME AS NEEDED FOR SLEEP   30 tablet   2   . HYDROcodone-acetaminophen (NORCO/VICODIN) 5-325 MG per tablet   Oral   Take 1-2 tablets by mouth every 6 (six) hours as needed for pain.   20 tablet   0   . promethazine (PHENERGAN) 25 MG tablet   Oral   Take 1 tablet (25 mg total) by mouth every 6 (six) hours as needed for nausea.   20 tablet   0     BP 118/90  Pulse 99  Temp(Src) 98.8 F (37.1 C) (Oral)  Resp 20  SpO2 97%  Physical Exam  Nursing note and vitals reviewed. Constitutional: She is oriented to  person, place, and time. She appears well-developed and well-nourished. No distress.  HENT:  Head: Normocephalic and atraumatic.  Mouth/Throat: Oropharynx is clear and moist.  Eyes: Conjunctivae and EOM are normal. Pupils are equal, round, and reactive to light.  Neck: Normal range of motion.  Cardiovascular: Normal rate, regular rhythm and normal heart sounds.   No murmur heard. Pulmonary/Chest: Effort normal and breath sounds normal.  Abdominal: Soft. Bowel sounds are normal. There is no tenderness.  Musculoskeletal: Normal range of motion. She exhibits no edema.  Neurological: She is alert and oriented to person, place, and time. No cranial nerve deficit. She exhibits normal muscle tone. Coordination normal.  Skin: Skin is warm. No rash noted. No erythema.    ED Course  Procedures (including critical care time)  Labs Reviewed - No data to display No results found.   1. Fibromyalgia       MDM  Patient followed by outpatient clinics. Patient with history of fibromyalgia. Exacerbation of that typical pain for her today became severe generalized bodyaches. Patient has some discomfort everyday but today is much worse. Associated with nausea which is also typical for her. Patient treated with IM hydromorphone and patient feeling much better. Also given oral Zofran. Patient without fever without any complicating symptoms historically.        Shelda Jakes, MD 11/26/12 2158

## 2012-11-26 NOTE — ED Notes (Signed)
Pt reports generalized body aches, pt reports having aches everyday d/t fibromyalgia but states that pain became severe today with nausea.

## 2012-11-28 ENCOUNTER — Other Ambulatory Visit: Payer: Self-pay | Admitting: *Deleted

## 2012-11-29 MED ORDER — PROMETHAZINE HCL 25 MG PO TABS: 25.0000 mg | ORAL_TABLET | Freq: Four times a day (QID) | ORAL | Status: DC | PRN

## 2012-12-08 ENCOUNTER — Other Ambulatory Visit: Payer: Self-pay | Admitting: *Deleted

## 2012-12-12 MED ORDER — ESOMEPRAZOLE MAGNESIUM 20 MG PO CPDR: 20.0000 mg | DELAYED_RELEASE_CAPSULE | Freq: Every day | ORAL | Status: DC

## 2012-12-22 ENCOUNTER — Encounter: Payer: Self-pay | Admitting: Home Health Services

## 2012-12-22 ENCOUNTER — Ambulatory Visit: Payer: Self-pay | Admitting: *Deleted

## 2012-12-23 NOTE — Progress Notes (Unsigned)
Patient arrived for recertification of [facility] card.  Patient's income over limit for [facility], but given 75% discount with [facility].

## 2012-12-25 ENCOUNTER — Encounter (HOSPITAL_COMMUNITY): Payer: Self-pay | Admitting: Emergency Medicine

## 2012-12-25 ENCOUNTER — Emergency Department (HOSPITAL_COMMUNITY)
Admission: EM | Admit: 2012-12-25 | Discharge: 2012-12-25 | Disposition: A | Discharge status: Home / Self Care | Payer: Self-pay | Attending: Emergency Medicine | Admitting: Emergency Medicine

## 2012-12-25 DIAGNOSIS — I10 Essential (primary) hypertension: Secondary | ICD-10-CM | POA: Insufficient documentation

## 2012-12-25 DIAGNOSIS — Z7982 Long term (current) use of aspirin: Secondary | ICD-10-CM | POA: Insufficient documentation

## 2012-12-25 DIAGNOSIS — F411 Generalized anxiety disorder: Secondary | ICD-10-CM | POA: Insufficient documentation

## 2012-12-25 DIAGNOSIS — IMO0001 Reserved for inherently not codable concepts without codable children: Secondary | ICD-10-CM | POA: Insufficient documentation

## 2012-12-25 DIAGNOSIS — K219 Gastro-esophageal reflux disease without esophagitis: Secondary | ICD-10-CM | POA: Insufficient documentation

## 2012-12-25 DIAGNOSIS — G43909 Migraine, unspecified, not intractable, without status migrainosus: Secondary | ICD-10-CM | POA: Insufficient documentation

## 2012-12-25 DIAGNOSIS — Z79899 Other long term (current) drug therapy: Secondary | ICD-10-CM | POA: Insufficient documentation

## 2012-12-25 DIAGNOSIS — G4730 Sleep apnea, unspecified: Secondary | ICD-10-CM | POA: Insufficient documentation

## 2012-12-25 DIAGNOSIS — G8929 Other chronic pain: Secondary | ICD-10-CM | POA: Insufficient documentation

## 2012-12-25 DIAGNOSIS — R110 Nausea: Secondary | ICD-10-CM | POA: Insufficient documentation

## 2012-12-25 MED ORDER — DIPHENHYDRAMINE HCL 50 MG/ML IJ SOLN
25.0000 mg | Freq: Once | INTRAMUSCULAR | Status: AC
  Administered 2012-12-25: 25 mg via INTRAMUSCULAR
  Filled 2012-12-25: qty 1

## 2012-12-25 MED ORDER — METOCLOPRAMIDE HCL 5 MG/ML IJ SOLN
10.0000 mg | Freq: Once | INTRAMUSCULAR | Status: AC
  Administered 2012-12-25: 10 mg via INTRAMUSCULAR
  Filled 2012-12-25: qty 2

## 2012-12-25 MED ORDER — KETOROLAC TROMETHAMINE 60 MG/2ML IM SOLN
60.0000 mg | Freq: Once | INTRAMUSCULAR | Status: AC
  Administered 2012-12-25: 60 mg via INTRAMUSCULAR
  Filled 2012-12-25: qty 2

## 2012-12-25 NOTE — ED Notes (Signed)
Pt c/o generalized pain and nausea.

## 2012-12-25 NOTE — ED Provider Notes (Signed)
History    CSN: [id number] Arrival date & time 12/25/12  1047  First MD Initiated Contact with Patient 12/25/12 1053     No chief complaint on file.  (Consider location/radiation/quality/duration/timing/severity/associated sxs/prior Treatment) HPI Comments: Patient presents to ER with complaints of pain. Patient reports a history of fibromyalgia. She says that she is hurting all over. She reports pain in all her muscles, base of the neck, back, arms, legs. This is similar to pain she has had before with her fibromyalgia. She is taking her medications without improvement. Patient is followed by family practice. She reports that she has followup in 2 weeks to "get her medications changed".  Patient does not any fever or concomitant illness.  Past Medical History  Diagnosis Date  . Anxiety   . Chronic pain   . Acid reflux disease   . Migraine   . Sleep apnea   . Fibromyalgia   . Hypertension   . Trichomonas contact, treated    Past Surgical History  Procedure Laterality Date  . Cholecystectomy      2008  . Total abdominal hysterectomy      1986  . Wisdom tooth extraction     Family History  Problem Relation Age of Onset  . Diabetes Mother   . Diabetes Sister   . Hypertension Mother   . Hypertension Father   . Cancer Father     Prostate  . Cancer Brother     Prostate  . Depression Mother   . Alcohol abuse Father    History  Substance Use Topics  . Smoking status: Never Smoker   . Smokeless tobacco: Not on file  . Alcohol Use: No   OB History   Grav Para Term Preterm Abortions TAB SAB Ect Mult Living   2 2 2       2      Review of Systems  Constitutional: Negative for fever.  Musculoskeletal: Positive for myalgias.  All other systems reviewed and are negative.    Allergies  Review of patient's allergies indicates no known allergies.  Home Medications   Current Outpatient Rx  Name  Route  Sig  Dispense  Refill  . acetaminophen (TYLENOL) 500 MG  tablet   Oral   Take 500 mg by mouth every 6 (six) hours as needed for pain (pain).         Marland Kitchen ALPRAZolam (XANAX PO)   Oral   Take by mouth.         Marland Kitchen aspirin 81 MG chewable tablet   Oral   Chew 81 mg by mouth daily.         . cyclobenzaprine (FLEXERIL) 10 MG tablet   Oral   Take 1 tablet (10 mg total) by mouth 3 (three) times daily as needed. Muscle spasm   30 tablet   2   . esomeprazole (NEXIUM) 20 MG capsule   Oral   Take 1 capsule (20 mg total) by mouth daily before breakfast.   30 capsule   5   . gabapentin (NEURONTIN) 100 MG capsule   Oral   Take 1 capsule (100 mg total) by mouth 3 (three) times daily.   90 capsule   3   . HYDROcodone-acetaminophen (NORCO/VICODIN) 5-325 MG per tablet   Oral   Take 1-2 tablets by mouth every 6 (six) hours as needed for pain.   20 tablet   0   . lisinopril-hydrochlorothiazide (PRINZIDE,ZESTORETIC) 20-25 MG per tablet   Oral   Take 1  tablet by mouth daily.   90 tablet   3   . promethazine (PHENERGAN) 25 MG tablet   Oral   Take 1 tablet (25 mg total) by mouth every 6 (six) hours as needed for nausea.   20 tablet   1   . simvastatin (ZOCOR) 10 MG tablet   Oral   Take 1 tablet (10 mg total) by mouth at bedtime.   90 tablet   3   . zolpidem (AMBIEN) 10 MG tablet      TAKE 1 TABLET BY MOUTH AT BEDTIME AS NEEDED FOR SLEEP   30 tablet   2    There were no vitals taken for this visit. Physical Exam  Constitutional: She is oriented to person, place, and time. She appears well-developed and well-nourished. No distress.  HENT:  Head: Normocephalic and atraumatic.  Right Ear: Hearing normal.  Left Ear: Hearing normal.  Nose: Nose normal.  Mouth/Throat: Oropharynx is clear and moist and mucous membranes are normal.  Eyes: Conjunctivae and EOM are normal. Pupils are equal, round, and reactive to light.  Neck: Normal range of motion. Neck supple.  Cardiovascular: Regular rhythm, S1 normal and S2 normal.  Exam reveals  no gallop and no friction rub.   No murmur heard. Pulmonary/Chest: Effort normal and breath sounds normal. No respiratory distress. She exhibits no tenderness.  Abdominal: Soft. Normal appearance and bowel sounds are normal. There is no hepatosplenomegaly. There is no tenderness. There is no rebound, no guarding, no tenderness at McBurney's point and negative Murphy's sign. No hernia.  Musculoskeletal: Normal range of motion.  Neurological: She is alert and oriented to person, place, and time. She has normal strength. No cranial nerve deficit or sensory deficit. Coordination normal. GCS eye subscore is 4. GCS verbal subscore is 5. GCS motor subscore is 6.  Skin: Skin is warm, dry and intact. No rash noted. No cyanosis.  Psychiatric: She has a normal mood and affect. Her speech is normal and behavior is normal. Thought content normal.    ED Course  Procedures (including critical care time) Labs Reviewed - No data to display No results found.  Diagnosis: Chronic pain exacerbation  MDM  Patient presents to the ER for evaluation of increased pain. Patient has a chronic pain syndrome which she reports has been diagnosed as fibromyalgia. She takes chronic narcotic therapy for this. Review of records reveal she was here exactly one month ago with a similar story. At that time she was given dilaudid. I hesitate to repeat this treatment plan today, as recurrent adminstration of dilaudid in the ER tends to lead to abuse.  Gilda Crease, MD 12/25/12 [phone]

## 2012-12-26 ENCOUNTER — Encounter: Payer: Self-pay | Admitting: Family Medicine

## 2012-12-26 ENCOUNTER — Ambulatory Visit (INDEPENDENT_AMBULATORY_CARE_PROVIDER_SITE_OTHER): Payer: Self-pay | Admitting: Family Medicine

## 2012-12-26 VITALS — BP 131/85 | HR 93 | Temp 97.8°F | Wt 176.0 lb

## 2012-12-26 DIAGNOSIS — G894 Chronic pain syndrome: Secondary | ICD-10-CM

## 2012-12-26 DIAGNOSIS — M797 Fibromyalgia: Secondary | ICD-10-CM

## 2012-12-26 DIAGNOSIS — R51 Headache: Secondary | ICD-10-CM

## 2012-12-26 DIAGNOSIS — IMO0001 Reserved for inherently not codable concepts without codable children: Secondary | ICD-10-CM

## 2012-12-26 DIAGNOSIS — K219 Gastro-esophageal reflux disease without esophagitis: Secondary | ICD-10-CM

## 2012-12-26 MED ORDER — KETOROLAC TROMETHAMINE 30 MG/ML IJ SOLN
30.0000 mg | Freq: Once | INTRAMUSCULAR | Status: AC
  Administered 2012-12-26: 30 mg via INTRAMUSCULAR

## 2012-12-26 MED ORDER — ESOMEPRAZOLE MAGNESIUM 20 MG PO CPDR: 20.0000 mg | DELAYED_RELEASE_CAPSULE | Freq: Every day | ORAL | Status: DC

## 2012-12-26 MED ORDER — ALPRAZOLAM 1 MG PO TABS: 1.0000 mg | ORAL_TABLET | Freq: Two times a day (BID) | ORAL | Status: DC | PRN

## 2012-12-26 MED ORDER — CYCLOBENZAPRINE HCL 10 MG PO TABS: 10.0000 mg | ORAL_TABLET | Freq: Three times a day (TID) | ORAL | Status: DC | PRN

## 2012-12-26 MED ORDER — PROMETHAZINE HCL 25 MG PO TABS: 25.0000 mg | ORAL_TABLET | Freq: Four times a day (QID) | ORAL | Status: DC | PRN

## 2012-12-26 NOTE — Patient Instructions (Addendum)
I am sorry you are having a headache today. I hope the Toradol helps. I have put in the referral to the pain clinic, let me know if you have any problems scheduling this.  Also, I have sent your refills but if I missed anything have the pharmacy send [place] a request.  Take care, I will see you back in about 2-3 months!  Fredrico Beedle M. Vada Yellen, M.D.

## 2012-12-26 NOTE — Progress Notes (Signed)
Patient ID: Sherry [name], female   DOB: 1961-01-06, 52 y.o.   MRN: [id number]  [facility] Anurag Scarfo M. Zyen Triggs, MD Phone: [phone]   Subjective: HPI: Patient is a 52 y.o. female presenting to clinic today for follow up appointment. Concerns today include refills and referral to pain management clinic  1. Pain - Needs refill on flexeril and xanax. Needs a new referral to pain management now that her orange card has been renewed (previously unable to schedule appointment.) She states she started a new job and pain is worse in legs and back when she works. 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 She does not always get up and move around, but has been encouraged to do so.   2. GERD - Needs Nexium refill. Has been out of PPI for about 2 weeks and has increased symptoms. Burning with eating. Needs refill on Zofran as well.  3. Headache - Has headache today. Has history of headaches. Currently her headache is a frontal headache. Some associated photophobia. Usually excedrin helps, she tried ibuprofen earlier today which did not help.  History Reviewed: Non smoker. Health Maintenance: Needs mammogram and colonoscopy  ROS: Please see HPI above.  Objective: Office vital signs reviewed. BP 131/85  Pulse 93  Temp(Src) 97.8 F (36.6 C) (Oral)  Wt 176 lb (79.833 kg)  BMI 34.37 kg/m2  Physical Examination:  General: Awake, alert. Lying on exam table in dark room.  HEENT: Atraumatic, normocephalic. Pupils round and reactive, slightly small despite being in the dark. Full extraocular movement. MMM.  Neck: No masses palpated. No LAD Pulm: CTAB, no wheezes Cardio: RRR, no murmurs appreciated Abdomen:+BS, soft, diffuse tender, nondistended Extremities: No edema, moves all extremities. TTP everywhere Neuro: Grossly intact. No focal deficits. CN 2-12 grossly intact  Assessment: 52 y.o. female follow up appointment  Plan: See Problem List and After Visit  Summary

## 2012-12-27 DIAGNOSIS — R519 Headache, unspecified: Secondary | ICD-10-CM | POA: Insufficient documentation

## 2012-12-27 DIAGNOSIS — R51 Headache: Secondary | ICD-10-CM | POA: Insufficient documentation

## 2012-12-27 HISTORY — DX: Headache: R51

## 2012-12-27 NOTE — Assessment & Plan Note (Signed)
Nexium refilled. Continue to avoid irritating foods.

## 2012-12-27 NOTE — Assessment & Plan Note (Signed)
No focal findings, likely related to stress. Toradol given in clinic. Continue OTC excedrin.

## 2012-12-27 NOTE — Assessment & Plan Note (Signed)
Medications refilled. Patient encouraged to use gabapentin. Pain referral sent, but with her type of coverage it is not likely she will be seen.

## 2012-12-29 ENCOUNTER — Telehealth: Payer: Self-pay | Admitting: Family Medicine

## 2012-12-29 MED ORDER — ESOMEPRAZOLE MAGNESIUM 20 MG PO CPDR: 20.0000 mg | DELAYED_RELEASE_CAPSULE | Freq: Every day | ORAL | Status: DC

## 2012-12-29 NOTE — Telephone Encounter (Signed)
Rx sent. Thanks!  Yemariam Magar M. Brinton Brandel, M.D.  

## 2012-12-29 NOTE — Telephone Encounter (Signed)
Patient is calling because the MAP program still hasn't gotten her Nexium med straightened out so she is asking for the doctor to send a Rx to [facility] on [address] because she has been without for 2 weeks and her stomach is killing her.

## 2012-12-30 NOTE — Telephone Encounter (Signed)
Pts dgt informed. Rolly Magri [name]  

## 2013-01-09 ENCOUNTER — Encounter: Payer: Self-pay | Admitting: Physical Medicine & Rehabilitation

## 2013-02-14 ENCOUNTER — Telehealth: Payer: Self-pay | Admitting: Family Medicine

## 2013-02-14 NOTE — Telephone Encounter (Signed)
Pt says she has called in her prescription for Ambien to Outpatient pharmacy and they are waiting for Dr Alfonzo Beers approval Please advise

## 2013-02-14 NOTE — Telephone Encounter (Signed)
Will forward to MD. Faith Patricelli,CMA  

## 2013-02-15 MED ORDER — ZOLPIDEM TARTRATE 10 MG PO TABS: ORAL_TABLET | ORAL | Status: DC

## 2013-02-15 NOTE — Telephone Encounter (Signed)
Refill faxed.  Bailley Guilford M. Marcee Jacobs, M.D.

## 2013-02-16 ENCOUNTER — Emergency Department (HOSPITAL_COMMUNITY)
Admission: EM | Admit: 2013-02-16 | Discharge: 2013-02-17 | Disposition: A | Discharge status: Home / Self Care | Payer: BC Managed Care – PPO | Attending: Emergency Medicine | Admitting: Emergency Medicine

## 2013-02-16 ENCOUNTER — Encounter (HOSPITAL_COMMUNITY): Payer: Self-pay | Admitting: Emergency Medicine

## 2013-02-16 DIAGNOSIS — R52 Pain, unspecified: Secondary | ICD-10-CM | POA: Insufficient documentation

## 2013-02-16 DIAGNOSIS — M25532 Pain in left wrist: Secondary | ICD-10-CM

## 2013-02-16 DIAGNOSIS — G43909 Migraine, unspecified, not intractable, without status migrainosus: Secondary | ICD-10-CM | POA: Insufficient documentation

## 2013-02-16 DIAGNOSIS — F411 Generalized anxiety disorder: Secondary | ICD-10-CM | POA: Insufficient documentation

## 2013-02-16 DIAGNOSIS — K219 Gastro-esophageal reflux disease without esophagitis: Secondary | ICD-10-CM | POA: Insufficient documentation

## 2013-02-16 DIAGNOSIS — Z8619 Personal history of other infectious and parasitic diseases: Secondary | ICD-10-CM | POA: Insufficient documentation

## 2013-02-16 DIAGNOSIS — M25539 Pain in unspecified wrist: Secondary | ICD-10-CM | POA: Insufficient documentation

## 2013-02-16 DIAGNOSIS — G8929 Other chronic pain: Secondary | ICD-10-CM | POA: Insufficient documentation

## 2013-02-16 DIAGNOSIS — Z79899 Other long term (current) drug therapy: Secondary | ICD-10-CM | POA: Insufficient documentation

## 2013-02-16 DIAGNOSIS — I10 Essential (primary) hypertension: Secondary | ICD-10-CM | POA: Insufficient documentation

## 2013-02-16 DIAGNOSIS — Z7982 Long term (current) use of aspirin: Secondary | ICD-10-CM | POA: Insufficient documentation

## 2013-02-16 NOTE — ED Notes (Signed)
Pt reports wrist pain starting 9/9, mild swelling noted, no known injuries, numbness to pinky finger.

## 2013-02-17 MED ORDER — HYDROCODONE-ACETAMINOPHEN 5-325 MG PO TABS
1.0000 | ORAL_TABLET | Freq: Once | ORAL | Status: AC
  Administered 2013-02-17: 1 via ORAL
  Filled 2013-02-17: qty 1

## 2013-02-17 MED ORDER — HYDROCODONE-ACETAMINOPHEN 5-325 MG PO TABS: 1.0000 | ORAL_TABLET | ORAL | Status: DC | PRN

## 2013-02-17 MED ORDER — NAPROXEN 500 MG PO TABS: 500.0000 mg | ORAL_TABLET | Freq: Two times a day (BID) | ORAL | Status: DC

## 2013-02-17 NOTE — ED Provider Notes (Signed)
CSN: [id number]     Arrival date & time 02/16/13  2219 History   First MD Initiated Contact with Patient 02/17/13 0119     Chief Complaint  Patient presents with  . Wrist Pain   HPI  History provided by the patient. Patient is a 52 year old female presenting with complaints of worsening and persistent left wrist pain. Patient states she began having left wrist pain on Tuesday afternoon. 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 She does not have any specific injury or trauma but states it did seem to be worse after a day of work and typing. Since then she has had fairly persistent pains in the wrist that do radiate some to the fourth and fifth finger areas. She denies any significant weakness or numbness to the hand or the fingers to me. Pains are worse with some movements and positions. She has taken some over-the-counter pain medicines without any significant relief. She denies any other aggravating or alleviating factors. She denies any prior history of similar symptoms to the wrist. No recent fever, chills or sweats.    Past Medical History  Diagnosis Date  . Anxiety   . Chronic pain   . Acid reflux disease   . Migraine   . Sleep apnea   . Fibromyalgia   . Hypertension   . Trichomonas contact, treated    Past Surgical History  Procedure Laterality Date  . Cholecystectomy      2008  . Total abdominal hysterectomy      1986  . Wisdom tooth extraction     Family History  Problem Relation Age of Onset  . Diabetes Mother   . Diabetes Sister   . Hypertension Mother   . Hypertension Father   . Cancer Father     Prostate  . Cancer Brother     Prostate  . Depression Mother   . Alcohol abuse Father    History  Substance Use Topics  . Smoking status: Never Smoker   . Smokeless tobacco: Not on file  . Alcohol Use: No   OB History   Grav Para Term Preterm Abortions TAB SAB Ect Mult Living   2 2 2       2      Review of Systems   Constitutional: Negative for fever.  Neurological: Negative for weakness and numbness.  All other systems reviewed and are negative.    Allergies  Review of patient's allergies indicates no known allergies.  Home Medications   Current Outpatient Rx  Name  Route  Sig  Dispense  Refill  . acetaminophen (TYLENOL) 500 MG tablet   Oral   Take 500 mg by mouth every 6 (six) hours as needed for pain (pain).         Marland Kitchen ALPRAZolam (XANAX) 1 MG tablet   Oral   Take 1 tablet (1 mg total) by mouth 2 (two) times daily as needed for sleep or anxiety.   30 tablet   1   . aspirin 81 MG chewable tablet   Oral   Chew 81 mg by mouth every morning.          . cyclobenzaprine (FLEXERIL) 10 MG tablet   Oral   Take 1 tablet (10 mg total) by mouth 3 (three) times daily as needed. Muscle spasm   30 tablet   2   . esomeprazole (NEXIUM) 20 MG capsule   Oral   Take 1 capsule (20 mg total) by mouth  daily before breakfast.   30 capsule   5   . lisinopril-hydrochlorothiazide (PRINZIDE,ZESTORETIC) 20-25 MG per tablet   Oral   Take 1 tablet by mouth daily.   90 tablet   3   . promethazine (PHENERGAN) 25 MG tablet   Oral   Take 1 tablet (25 mg total) by mouth every 6 (six) hours as needed for nausea.   20 tablet   1   . simvastatin (ZOCOR) 10 MG tablet   Oral   Take 1 tablet (10 mg total) by mouth at bedtime.   90 tablet   3   . zolpidem (AMBIEN) 10 MG tablet      TAKE 1 TABLET BY MOUTH AT BEDTIME AS NEEDED FOR SLEEP   30 tablet   2    BP 125/77  Pulse 88  Temp(Src) 98.1 F (36.7 C) (Oral)  Resp 18  SpO2 100% Physical Exam  Nursing note and vitals reviewed. Constitutional: She is oriented to person, place, and time. She appears well-developed and well-nourished. No distress.  HENT:  Head: Normocephalic.  Cardiovascular: Normal rate and regular rhythm.   Pulmonary/Chest: Effort normal and breath sounds normal. No respiratory distress. She has no wheezes. She has no rales.   Abdominal: Soft.  Musculoskeletal: Normal range of motion. She exhibits tenderness. She exhibits no edema.  Pain over the anterior middle left wrist and over the distal ulnar area. No gross deformities. No swelling or skin changes. Normal range of motion. Normal grip strength. Normal sensation to the fingers and capillary refill.  Neurological: She is alert and oriented to person, place, and time.  Skin: Skin is warm and dry. No rash noted.  Psychiatric: She has a normal mood and affect. Her behavior is normal.    ED Course  Procedures     MDM   1. Wrist pain, acute, left      1:35AM patient seen and evaluated. Patient well-appearing no acute distress significant injury. Patient will be placed in a brace with instructions for Rice therapy. She will followup with PCP for continued evaluation and treatment.   Angus Seller, PA-C 02/17/13 [phone]

## 2013-02-17 NOTE — ED Notes (Signed)
Patient c/o L wrist pain for the past few days, patient states little finger feels numb. Slight swelling observed. Patient states she thinks she may have carpel tunnel because she does a lot of typing. Patient A&Ox4, NAD, accompanied by her daughter.

## 2013-02-17 NOTE — ED Notes (Signed)
PA, Peter at bedside.

## 2013-02-23 ENCOUNTER — Other Ambulatory Visit: Payer: Self-pay | Admitting: Family Medicine

## 2013-02-23 NOTE — ED Provider Notes (Signed)
Medical screening examination/treatment/procedure(s) were performed by non-physician practitioner and as supervising physician I was immediately available for consultation/collaboration.  Diamonique Ruedas T Brantley Wiley, MD 02/23/13 0853 

## 2013-03-02 ENCOUNTER — Other Ambulatory Visit: Payer: Self-pay | Admitting: Family Medicine

## 2013-03-02 MED ORDER — ALPRAZOLAM 1 MG PO TABS: 1.0000 mg | ORAL_TABLET | Freq: Two times a day (BID) | ORAL | Status: DC | PRN

## 2013-03-02 NOTE — Progress Notes (Signed)
Could you please call [facility] pharmacy at [phone] to authorize a refill on patient's Xanax 1mg  take one tab po BID prn anxiety and sleep, dispense #30 with 1 additional refill. (Prior Rx number was [id number])  I have updated it in her medication list as a refill.  Thank you! Orlando Devereux M. Kimmie Berggren, M.D.

## 2013-03-03 NOTE — Progress Notes (Signed)
Message left for pt that rx was sent to pharmacy.  Jamarie Joplin,CMA

## 2013-03-07 ENCOUNTER — Telehealth: Payer: Self-pay | Admitting: Family Medicine

## 2013-03-07 NOTE — Telephone Encounter (Signed)
Spoke with daughter Victorino Sparrow and they seem to think someone stole it.  Pt has been looking for medication and still unable to find it.  States that they are planning on filing a police report today.  Jazmin Hartsell,CMA

## 2013-03-07 NOTE — Telephone Encounter (Signed)
I am unable to refill this patient's prescription. I am sorry this has happened, but it will have to be 30 days after the date of last written Rx since it is a controlled substance.  Thanks, [employer]. Maurie Olesen, M.D.

## 2013-03-07 NOTE — Telephone Encounter (Signed)
pts daughter called. Mom lost her xanax that she had filled over the weekend. Please advise

## 2013-03-07 NOTE — Telephone Encounter (Signed)
Spoke with daughter and she is aware.  Alpha Mysliwiec,CMA

## 2013-03-10 ENCOUNTER — Encounter: Payer: Self-pay | Admitting: Physical Medicine & Rehabilitation

## 2013-03-10 ENCOUNTER — Encounter: Payer: Self-pay | Attending: Physical Medicine & Rehabilitation | Admitting: Physical Medicine & Rehabilitation

## 2013-03-10 ENCOUNTER — Ambulatory Visit (HOSPITAL_COMMUNITY)
Admission: RE | Admit: 2013-03-10 | Discharge: 2013-03-10 | Disposition: A | Discharge status: Home / Self Care | Payer: BC Managed Care – PPO | Source: Ambulatory Visit | Attending: Physical Medicine & Rehabilitation | Admitting: Physical Medicine & Rehabilitation

## 2013-03-10 VITALS — BP 155/86 | HR 109 | Resp 16 | Ht 60.0 in | Wt 179.0 lb

## 2013-03-10 DIAGNOSIS — M545 Low back pain, unspecified: Secondary | ICD-10-CM | POA: Insufficient documentation

## 2013-03-10 DIAGNOSIS — M25532 Pain in left wrist: Secondary | ICD-10-CM

## 2013-03-10 DIAGNOSIS — M25539 Pain in unspecified wrist: Secondary | ICD-10-CM | POA: Insufficient documentation

## 2013-03-10 DIAGNOSIS — M47816 Spondylosis without myelopathy or radiculopathy, lumbar region: Secondary | ICD-10-CM | POA: Insufficient documentation

## 2013-03-10 DIAGNOSIS — M797 Fibromyalgia: Secondary | ICD-10-CM

## 2013-03-10 DIAGNOSIS — M79609 Pain in unspecified limb: Secondary | ICD-10-CM

## 2013-03-10 DIAGNOSIS — M79673 Pain in unspecified foot: Secondary | ICD-10-CM

## 2013-03-10 DIAGNOSIS — G894 Chronic pain syndrome: Secondary | ICD-10-CM | POA: Insufficient documentation

## 2013-03-10 DIAGNOSIS — G8929 Other chronic pain: Secondary | ICD-10-CM

## 2013-03-10 DIAGNOSIS — M47817 Spondylosis without myelopathy or radiculopathy, lumbosacral region: Secondary | ICD-10-CM

## 2013-03-10 DIAGNOSIS — Z79899 Other long term (current) drug therapy: Secondary | ICD-10-CM | POA: Insufficient documentation

## 2013-03-10 DIAGNOSIS — IMO0001 Reserved for inherently not codable concepts without codable children: Secondary | ICD-10-CM | POA: Insufficient documentation

## 2013-03-10 DIAGNOSIS — Z5181 Encounter for therapeutic drug level monitoring: Secondary | ICD-10-CM

## 2013-03-10 DIAGNOSIS — G43909 Migraine, unspecified, not intractable, without status migrainosus: Secondary | ICD-10-CM | POA: Insufficient documentation

## 2013-03-10 HISTORY — DX: Pain in left wrist: M25.532

## 2013-03-10 MED ORDER — GABAPENTIN 100 MG PO CAPS: 100.0000 mg | ORAL_CAPSULE | Freq: Two times a day (BID) | ORAL | Status: DC

## 2013-03-10 MED ORDER — NAPROXEN 500 MG PO TABS: 500.0000 mg | ORAL_TABLET | Freq: Two times a day (BID) | ORAL | Status: DC

## 2013-03-10 NOTE — Patient Instructions (Signed)
TRY DHEA, 25MG  DAILY FOR MUSCLE PAIN AND ENERGY.   ALSO CONSIDER MELATONIN, COENXZYME Q10,

## 2013-03-10 NOTE — Progress Notes (Signed)
Subjective:    Patient ID: Sherry [name], female    DOB: [birth year], 52 y.o.   MRN: [id number]  HPI  This is an initial visit for Sherry [name] who comes into today regarding her fibromyalgia syndrome. She has pain diffusely throughout her body. Her pain is often worst when she gets out of the bed in the morning (legs tight especially).  She also struggles with cool, damp weather. Her pain also increases with activity such as when she works. She works full time as a Database administrator. The Fall seems to be the time of year when her pain is most severe.   She remembers having pain first about 10 years ago. She remembers having "pressure" in her legs which became uncomfortable. She was told she had FMS by an Scientist, product/process development Physician" after two years of work up.   She did apparently have a work up for back pain. I found an MRI from 08/2004 which was notable only for facet disease at L5-S1.  She also tells me that she had NCS/EMG which was "negative."  Heat helps her leg pain. She has used hydrocodone when available, although she doesn't use it regularly. Flexeril can help her cramping. Resting helps her pain. Her sleep is fair.   She was started on naproxen recently for left wrist pain and swelling. The left wrist became swollen recently and was difficult to move.   Migraine headaches can also be a problem. She tells me that she has a headache once a month or so. She denies any aura, but does frequently get nauseas with the headache. She has used excedrin migraine and benadryl effectively for these.   Pain Inventory Average Pain 8 Pain Right Now 6 My pain is aching  In the last 24 hours, has pain interfered with the following? General activity 10 Relation with others 10 Enjoyment of life 10 What TIME of day is your pain at its worst? no set time Sleep (in general) Fair  Pain is worse with: walking, bending, sitting and standing Pain improves with: heat/ice and medication Relief from Meds:  5  Mobility walk without assistance ability to climb steps?  yes do you drive?  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  no  Physicians involved in your care [name]   Family History  Problem Relation Age of Onset  . Diabetes Mother   . Hypertension Mother   . Depression Mother   . Diabetes Sister   . Hypertension Father   . Cancer Father     Prostate  . Alcohol abuse Father   . Cancer Brother     Prostate   History   Social History  . Marital Status: Single    Spouse Name: N/A    Number of Children: N/A  . Years of Education: N/A   Occupational History  . Unemployed    Social History Main Topics  . Smoking status: Never Smoker   . Smokeless tobacco: None  . Alcohol Use: No  . Drug Use: No  . Sexual Activity: Not Currently    Birth Control/ Protection: None     Comment: last intercouse two months ago   Other Topics Concern  . None   Social History Narrative   Lives with   2 Children: Ages 89 and 24   4 Grandchildren: 2, 3, 4 and 69  Brother      Diet: "Barely eats". Does not exercise due to pain.      03/2011- Currently unemployed. 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 Unsure of last pap.   Unsure of last Td   Unsure of last flu shot   Past Surgical History  Procedure Laterality Date  . Cholecystectomy      2008  . Total abdominal hysterectomy      1986  . Wisdom tooth extraction     Past Medical History  Diagnosis Date  . Anxiety   . Chronic pain   . Acid reflux disease   . Migraine   . Sleep apnea   . Fibromyalgia   . Hypertension   . Trichomonas contact, treated    BP 155/86  Pulse 109  Resp 16  Ht 5' (1.524 m)  Wt 179 lb (81.194 kg)  BMI 34.96 kg/m2  SpO2 96%     Review of Systems   Gastrointestinal: Positive for constipation.  Musculoskeletal: Positive for myalgias, arthralgias and gait problem.  Neurological: Positive for weakness and numbness.  Psychiatric/Behavioral: The patient is nervous/anxious.   All other systems reviewed and are negative.       Objective:   Physical Exam   General: Alert and oriented x 3, No apparent distress. Slightly overweight.  HEENT: Head is normocephalic, atraumatic, PERRLA, EOMI, sclera anicteric, oral mucosa pink and moist, dentition intact, ext ear canals clear,  Neck: Supple without JVD or lymphadenopathy Heart: Reg rate and rhythm. No murmurs rubs or gallops Chest: CTA bilaterally without wheezes, rales, or rhonchi; no distress Abdomen: Soft, non-tender, non-distended, bowel sounds positive. Extremities: No clubbing, cyanosis, or edema. Pulses are 1+ in both feet, both feet were cool however. No vascular changes.  Skin: Clean and intact without signs of breakdown Neuro: sensation grossly intact but both legs are hypersensitive almost allodynic to touch.  Strength functional in all 4.  Musculoskeletal: head forward posture. Tight right trap, tender to touch. Legs hypersensitive. Can forward flex at waist to 80 degrees. Had more pain when coming back to neutral extension and with facet maneuvers. Back positioning fair, slightly rotated clockwise. Left wrist tender at carpo-ulnar junction with associated swelling. Pain refers to hand and forearm. Doesn't have many characteristic trigger points.  Psych: Pt's affect is appropriate. Pt is cooperative        Assessment & Plan:  1. Chronic pain syndrome/ fibromyalgia although pain seems to center more in legs/feet.  2. Left wrist pain, subacute 3. Low back pain.    Plan: 1. Xrays left wrist, rule out inflammatory arthritis 2. Trial of low dose gabapentin 100mg  bid 3. UDS was collected. I am willing to rx hydrocodone if urine is consistent 4. Naproxen 500mg  bid for wrist  pain. 5. Wear wrist splint at work 6. DHEA, Mg++, co-q 10 supplements were recommended. 7. Aerobic exercise as possible. 8. Consider lab work up for other causes of neuropathic/central pain 9. Ordered ABI's to assess legs, rule out vascular claudication. She does have family members with a history of amputation 10. Follow up in one month. 45 minutes of face to face patient care time were spent during this visit. All questions were encouraged and answered.

## 2013-03-23 ENCOUNTER — Other Ambulatory Visit: Payer: Self-pay | Admitting: Family Medicine

## 2013-03-23 MED ORDER — CYCLOBENZAPRINE HCL 10 MG PO TABS: 10.0000 mg | ORAL_TABLET | Freq: Every day | ORAL | Status: DC

## 2013-03-25 ENCOUNTER — Emergency Department (HOSPITAL_COMMUNITY)
Admission: EM | Admit: 2013-03-25 | Discharge: 2013-03-26 | Disposition: A | Discharge status: Home / Self Care | Payer: BC Managed Care – PPO | Attending: Emergency Medicine | Admitting: Emergency Medicine

## 2013-03-25 ENCOUNTER — Encounter (HOSPITAL_COMMUNITY): Payer: Self-pay | Admitting: Emergency Medicine

## 2013-03-25 DIAGNOSIS — F411 Generalized anxiety disorder: Secondary | ICD-10-CM | POA: Insufficient documentation

## 2013-03-25 DIAGNOSIS — Z3202 Encounter for pregnancy test, result negative: Secondary | ICD-10-CM | POA: Insufficient documentation

## 2013-03-25 DIAGNOSIS — Z79899 Other long term (current) drug therapy: Secondary | ICD-10-CM | POA: Insufficient documentation

## 2013-03-25 DIAGNOSIS — R5383 Other fatigue: Secondary | ICD-10-CM

## 2013-03-25 DIAGNOSIS — Z7982 Long term (current) use of aspirin: Secondary | ICD-10-CM | POA: Insufficient documentation

## 2013-03-25 DIAGNOSIS — R059 Cough, unspecified: Secondary | ICD-10-CM | POA: Insufficient documentation

## 2013-03-25 DIAGNOSIS — F419 Anxiety disorder, unspecified: Secondary | ICD-10-CM

## 2013-03-25 DIAGNOSIS — I10 Essential (primary) hypertension: Secondary | ICD-10-CM | POA: Insufficient documentation

## 2013-03-25 DIAGNOSIS — R5381 Other malaise: Secondary | ICD-10-CM | POA: Insufficient documentation

## 2013-03-25 DIAGNOSIS — R42 Dizziness and giddiness: Secondary | ICD-10-CM | POA: Insufficient documentation

## 2013-03-25 DIAGNOSIS — G8929 Other chronic pain: Secondary | ICD-10-CM | POA: Insufficient documentation

## 2013-03-25 DIAGNOSIS — R05 Cough: Secondary | ICD-10-CM | POA: Insufficient documentation

## 2013-03-25 DIAGNOSIS — K219 Gastro-esophageal reflux disease without esophagitis: Secondary | ICD-10-CM | POA: Insufficient documentation

## 2013-03-25 LAB — BASIC METABOLIC PANEL
BUN: 19 mg/dL (ref 6–23)
Calcium: 10.6 mg/dL — ABNORMAL HIGH (ref 8.4–10.5)
Creatinine, Ser: 0.85 mg/dL (ref 0.50–1.10)
GFR calc Af Amer: >90 mL/min (ref >90)
GFR calc non Af Amer: 79 mL/min — ABNORMAL LOW (ref >90)

## 2013-03-25 LAB — CBC WITH DIFFERENTIAL/PLATELET
Basophils Absolute: 0 10*3/uL (ref 0.0–0.1)
Basophils Relative: 0 % (ref 0–1)
Eosinophils Absolute: 0.1 10*3/uL (ref 0.0–0.7)
Eosinophils Relative: 1 % (ref 0–5)
HCT: 36.4 % (ref 36.0–46.0)
MCH: 30.7 pg (ref 26.0–34.0)
MCHC: 33.8 g/dL (ref 30.0–36.0)
MCV: 90.8 fL (ref 78.0–100.0)
Monocytes Absolute: 0.8 10*3/uL (ref 0.1–1.0)
Monocytes Relative: 8 % (ref 3–12)
Neutro Abs: 5.2 10*3/uL (ref 1.7–7.7)
Platelets: 515 10*3/uL — ABNORMAL HIGH (ref 150–400)
RDW: 13.3 % (ref 11.5–15.5)

## 2013-03-25 LAB — URINALYSIS, ROUTINE W REFLEX MICROSCOPIC
Bilirubin Urine: NEGATIVE
Glucose, UA: NEGATIVE mg/dL
Ketones, ur: NEGATIVE mg/dL
Leukocytes, UA: NEGATIVE
Nitrite: NEGATIVE
Protein, ur: NEGATIVE mg/dL

## 2013-03-25 LAB — GLUCOSE, CAPILLARY: Glucose-Capillary: 137 mg/dL — ABNORMAL HIGH (ref 70–99)

## 2013-03-25 LAB — PREGNANCY, URINE: Preg Test, Ur: NEGATIVE

## 2013-03-25 MED ORDER — SODIUM CHLORIDE 0.9 % IV BOLUS (SEPSIS)
1000.0000 mL | Freq: Once | INTRAVENOUS | Status: AC
  Administered 2013-03-25: 1000 mL via INTRAVENOUS

## 2013-03-25 MED ORDER — LORAZEPAM 2 MG/ML IJ SOLN
1.0000 mg | Freq: Once | INTRAMUSCULAR | Status: AC
  Administered 2013-03-25: 1 mg via INTRAVENOUS
  Filled 2013-03-25: qty 1

## 2013-03-25 MED ORDER — ONDANSETRON HCL 4 MG/2ML IJ SOLN
4.0000 mg | Freq: Once | INTRAMUSCULAR | Status: AC
  Administered 2013-03-25: 4 mg via INTRAVENOUS
  Filled 2013-03-25: qty 2

## 2013-03-25 MED ORDER — MORPHINE SULFATE 4 MG/ML IJ SOLN
4.0000 mg | Freq: Once | INTRAMUSCULAR | Status: AC
  Administered 2013-03-25: 4 mg via INTRAVENOUS
  Filled 2013-03-25: qty 1

## 2013-03-25 NOTE — ED Notes (Signed)
MD at bedside. 

## 2013-03-25 NOTE — ED Notes (Signed)
Patient has stated she has fibromyoma. Has not taken any pain medication in a while. C/o pain in in low extremities.

## 2013-03-25 NOTE — ED Provider Notes (Signed)
CSN: [id number]     Arrival date & time 03/25/13  1903 History   First MD Initiated Contact with Patient 03/25/13 2010     Chief Complaint  Patient presents with  . Shortness of Breath   (Consider location/radiation/quality/duration/timing/severity/associated sxs/prior Treatment) Patient is a 52 y.o. female presenting with general illness.  Illness Quality:  Generalized fatigue Severity:  Severe Onset quality:  Sudden Duration:  2 hours Timing:  Constant Progression:  Unchanged Chronicity:  New Context:  Went to a funeral earlier today.  Misplaced her anxiety medication. Relieved by:  Nothing Worsened by:  Nothing Associated symptoms: shortness of breath (initially, none currently)   Associated symptoms: no abdominal pain, no chest pain, no congestion, no cough, no diarrhea, no fever, no loss of consciousness, no nausea and no vomiting   Associated symptoms comment:  Near syncope   Past Medical History  Diagnosis Date  . Anxiety   . Chronic pain   . Acid reflux disease   . Migraine   . Sleep apnea   . Fibromyalgia   . Hypertension   . Trichomonas contact, treated    Past Surgical History  Procedure Laterality Date  . Cholecystectomy      2008  . Total abdominal hysterectomy      1986  . Wisdom tooth extraction     Family History  Problem Relation Age of Onset  . Diabetes Mother   . Hypertension Mother   . Depression Mother   . Diabetes Sister   . Hypertension Father   . Cancer Father     Prostate  . Alcohol abuse Father   . Cancer Brother     Prostate   History  Substance Use Topics  . Smoking status: Never Smoker   . Smokeless tobacco: Not on file  . Alcohol Use: No   OB History   Grav Para Term Preterm Abortions TAB SAB Ect Mult Living   2 2 2       2      Review of Systems  Constitutional: Negative for fever.  HENT: Negative for congestion.   Respiratory: Positive for shortness of breath (initially, none currently). Negative for cough.    Cardiovascular: Negative for chest pain.  Gastrointestinal: Negative for nausea, vomiting, abdominal pain and diarrhea.  Neurological: Negative for loss of consciousness.  All other systems reviewed and are negative.    Allergies  Review of patient's allergies indicates no known allergies.  Home Medications   Current Outpatient Rx  Name  Route  Sig  Dispense  Refill  . acetaminophen (TYLENOL) 500 MG tablet   Oral   Take 500 mg by mouth every 6 (six) hours as needed for pain (pain).         Marland Kitchen ALPRAZolam (XANAX) 1 MG tablet   Oral   Take 1 tablet (1 mg total) by mouth 2 (two) times daily as needed for sleep or anxiety.   30 tablet   1   . aspirin 81 MG chewable tablet   Oral   Chew 81 mg by mouth every morning.          Marland Kitchen aspirin-acetaminophen-caffeine (EXCEDRIN MIGRAINE) 250-250-65 MG per tablet   Oral   Take 1 tablet by mouth every 6 (six) hours as needed for pain.         . cyclobenzaprine (FLEXERIL) 10 MG tablet   Oral   Take 1 tablet (10 mg total) by mouth daily. Muscle spasm   30 tablet   1   .  esomeprazole (NEXIUM) 20 MG capsule   Oral   Take 1 capsule (20 mg total) by mouth daily before breakfast.   30 capsule   5   . gabapentin (NEURONTIN) 100 MG capsule   Oral   Take 1 capsule (100 mg total) by mouth 2 (two) times daily. Take one at night for one week. If tolerated, increase to twice daily   60 capsule   3   . lisinopril-hydrochlorothiazide (PRINZIDE,ZESTORETIC) 20-25 MG per tablet   Oral   Take 1 tablet by mouth daily.   90 tablet   3   . naproxen (NAPROSYN) 500 MG tablet   Oral   Take 1 tablet (500 mg total) by mouth 2 (two) times daily.   60 tablet   1   . promethazine (PHENERGAN) 25 MG tablet   Oral   Take 25 mg by mouth every 6 (six) hours as needed for nausea.         . simvastatin (ZOCOR) 10 MG tablet   Oral   Take 1 tablet (10 mg total) by mouth at bedtime.   90 tablet   3   . zolpidem (AMBIEN) 10 MG tablet      TAKE  1 TABLET BY MOUTH AT BEDTIME AS NEEDED FOR SLEEP   30 tablet   2    BP 149/93  Pulse 97  Temp(Src) 98.2 F (36.8 C) (Oral)  Resp 16  SpO2 99% Physical Exam  Nursing note and vitals reviewed. Constitutional: She is oriented to person, place, and time. She appears well-developed and well-nourished. No distress.  HENT:  Head: Normocephalic and atraumatic.  Mouth/Throat: Oropharynx is clear and moist.  Eyes: Conjunctivae are normal. Pupils are equal, round, and reactive to light. No scleral icterus.  Neck: Neck supple.  Cardiovascular: Normal rate, regular rhythm, normal heart sounds and intact distal pulses.   No murmur heard. Pulmonary/Chest: Effort normal and breath sounds normal. No stridor. No respiratory distress. She has no rales.  Abdominal: Soft. Bowel sounds are normal. She exhibits no distension. There is no tenderness.  Musculoskeletal: Normal range of motion.  Neurological: She is alert and oriented to person, place, and time.  Not lethargic  Skin: Skin is warm and dry. No rash noted.  Psychiatric: She has a normal mood and affect. Her behavior is normal.    ED Course  Procedures (including critical care time) Labs Review Labs Reviewed  GLUCOSE, CAPILLARY - Abnormal; Notable for the following:    Glucose-Capillary 137 (*)    All other components within normal limits  CBC WITH DIFFERENTIAL - Abnormal; Notable for the following:    Platelets 515 (*)    All other components within normal limits  BASIC METABOLIC PANEL - Abnormal; Notable for the following:    Glucose, Bld 105 (*)    Calcium 10.6 (*)    GFR calc non Af Amer 79 (*)    All other components within normal limits  URINALYSIS, ROUTINE W REFLEX MICROSCOPIC  PREGNANCY, URINE   Imaging Review No results found.  EKG Interpretation   None     EKG - sinus rhythm, rate 87, normal axis, RBBB, consistent with prior.    MDM   1. Fatigue   2. Anxiety    52 yo female with history of fibromyalgia  presenting after a near syncope episode which occurred while she was shopping with her family.  She felt lightheaded and short of breath, but did not pass out.  She had no chest pain, nausea,  or diaphoresis.  She no longer feels lightheaded, but does feel fatigued, which is why she came to the ED.  Her EKG was unchanged, her labtests were normal.  On re-exam, she reported that she was at a funeral earlier today and thinks her symptoms are related to that.  She was given IV ativan, which improved her symptoms.  I doubt cardiogenic cause of her symptoms.  She and her family were comfortable with discharge home with PCP follow up.  Return precautions given.      Candyce Churn, MD 03/25/13 [phone]

## 2013-03-25 NOTE — ED Notes (Signed)
Pt arrived to ED with a complaint of shortness of breath.  Pt was shopping when she had a episode of lightheadedness and shortness of breath.  Pt has hypertension.  Pt ias lethargic at this time but able to vocalize complaint.

## 2013-03-26 NOTE — ED Notes (Signed)
Patient will used wheelchair to discharge opposed to

## 2013-03-30 ENCOUNTER — Ambulatory Visit (INDEPENDENT_AMBULATORY_CARE_PROVIDER_SITE_OTHER): Payer: Self-pay | Admitting: Family Medicine

## 2013-03-30 ENCOUNTER — Encounter: Payer: Self-pay | Admitting: Family Medicine

## 2013-03-30 VITALS — BP 122/88 | HR 104 | Temp 98.3°F | Ht 60.0 in | Wt 178.0 lb

## 2013-03-30 DIAGNOSIS — E669 Obesity, unspecified: Secondary | ICD-10-CM

## 2013-03-30 DIAGNOSIS — Z23 Encounter for immunization: Secondary | ICD-10-CM

## 2013-03-30 DIAGNOSIS — F419 Anxiety disorder, unspecified: Secondary | ICD-10-CM

## 2013-03-30 DIAGNOSIS — F411 Generalized anxiety disorder: Secondary | ICD-10-CM

## 2013-03-30 NOTE — Assessment & Plan Note (Signed)
A: Suspect chronic problem that may have been acutely exacerbated resulting in ED visit. Patient is not acutely anxious at this visit she does request a refill of her Xanax but as per phone notes her PCP will provide a refill when it is due.  P: Patient to followup with her PCP who provide refills when they are due and continue to perform maintenance of her chronic anxiety.

## 2013-03-30 NOTE — Assessment & Plan Note (Signed)
A: I suspect the patient's fatigue is in large part  related to her mood and obesity (poor nutrition and lack of exercise). P: Have advised the patient to look very closely her diet and to make small changes towards a healthier lifestyle.  Have also advised patient to take about what low-impact exercises she could incorporate into her regimen in order to ultimately improve her energy levels.

## 2013-03-30 NOTE — Progress Notes (Addendum)
  Subjective:    Patient ID: Sherry [name], female    DOB: [birth year], 52 y.o.   MRN: [id number]  HPI 52 year old female presents for follow visit to discuss the following:  #1 ED visit: Patient seen at the ED on 03/25/2013. She presented with acute onset of fatigue associated with shortness of breath and mild dizziness. This happened while she was getting ready to disc shopping after funeral. She was evaluated and had a normal EKG and normal blood work. She reports that the symptoms have not recurred since the visit. At the time her symptoms were attributed to anxiety she was given IV Ativan x1. She is out of her Xanax and requests a refill. She denies chest pain, current shortness of breath, palpitations currently. She does not believe that the funeral of precipitating anxiety as he was not very close to the deceased. She has had loss of multiple close family members in a short span and did contemplate this loss transiently while at the funeral.   #2 chronic pain patient has chronic pain in her wrist she had some x-rays obtained following her initial pain management visit and is interested in the results.  #3 chronic fatigue: Patient admits to chronic fatigue. She also admits to poor sleep. She also admits to poor dietary habits and no exercise.  Soc Hx: denies tobacco, ETOH, IDU Review of Systems As per HPI     Objective:   Physical Exam BP 122/88  Pulse 104  Temp(Src) 98.3 F (36.8 C) (Oral)  Ht 5' (1.524 m)  Wt 178 lb (80.74 kg)  BMI 34.76 kg/m2 General appearance: alert, cooperative and no distress Neck: no adenopathy, no carotid bruit, no JVD, supple, symmetrical, trachea midline and thyroid not enlarged, symmetric, no tenderness/mass/nodules Lungs: clear to auscultation bilaterally Heart: regular rate and rhythm, S1, S2 normal, no murmur, click, rub or gallop Psych: Normal mood and affect. Patient's thought content is linear. Her speech patterns are normal.   PHQ-9: score  of 7. Documented in doc flowsheet. GAD-7 scale: Score of 8. 0 questions 6 and 7. One to questions 1, 2 and 5. 2 to question 3. 3 to question 4.    Assessment & Plan:

## 2013-03-30 NOTE — Patient Instructions (Signed)
Ms. Diego,   Thank you for coming in today. Please continue your current medication regimen. Dr. Mikel Cella will refill your xanax when it is due.  Please call pain management regarding your x-ray.   For increasing energy:  1. Make sure to eat breakfast, lunch and dinner (also may add one snack mid morning or mid afternoon).  2. Carbs: no more than 2 servings (30 gram/2oz) per meal and 1 serving per snack.  3. Exercise such that you sweat some and your heart rate goes up most days of the week (water aerobic, elliptical are nice low impact exercises).  4. Water, water, water   F/u with Dr. Mikel Cella in one month.   Dr. Armen Pickup

## 2013-04-02 ENCOUNTER — Emergency Department (HOSPITAL_COMMUNITY): Payer: BC Managed Care – PPO

## 2013-04-02 ENCOUNTER — Emergency Department (HOSPITAL_COMMUNITY)
Admission: EM | Admit: 2013-04-02 | Discharge: 2013-04-03 | Disposition: A | Discharge status: Home / Self Care | Payer: BC Managed Care – PPO | Attending: Emergency Medicine | Admitting: Emergency Medicine

## 2013-04-02 ENCOUNTER — Encounter (HOSPITAL_COMMUNITY): Payer: Self-pay | Admitting: Emergency Medicine

## 2013-04-02 DIAGNOSIS — S92002A Unspecified fracture of left calcaneus, initial encounter for closed fracture: Secondary | ICD-10-CM

## 2013-04-02 DIAGNOSIS — Z79899 Other long term (current) drug therapy: Secondary | ICD-10-CM | POA: Insufficient documentation

## 2013-04-02 DIAGNOSIS — W1789XA Other fall from one level to another, initial encounter: Secondary | ICD-10-CM | POA: Insufficient documentation

## 2013-04-02 DIAGNOSIS — G4730 Sleep apnea, unspecified: Secondary | ICD-10-CM | POA: Insufficient documentation

## 2013-04-02 DIAGNOSIS — G43909 Migraine, unspecified, not intractable, without status migrainosus: Secondary | ICD-10-CM | POA: Insufficient documentation

## 2013-04-02 DIAGNOSIS — F411 Generalized anxiety disorder: Secondary | ICD-10-CM | POA: Insufficient documentation

## 2013-04-02 DIAGNOSIS — Z7982 Long term (current) use of aspirin: Secondary | ICD-10-CM | POA: Insufficient documentation

## 2013-04-02 DIAGNOSIS — I10 Essential (primary) hypertension: Secondary | ICD-10-CM | POA: Insufficient documentation

## 2013-04-02 DIAGNOSIS — K219 Gastro-esophageal reflux disease without esophagitis: Secondary | ICD-10-CM | POA: Insufficient documentation

## 2013-04-02 DIAGNOSIS — Y929 Unspecified place or not applicable: Secondary | ICD-10-CM | POA: Insufficient documentation

## 2013-04-02 DIAGNOSIS — Y939 Activity, unspecified: Secondary | ICD-10-CM | POA: Insufficient documentation

## 2013-04-02 DIAGNOSIS — S92009A Unspecified fracture of unspecified calcaneus, initial encounter for closed fracture: Secondary | ICD-10-CM | POA: Insufficient documentation

## 2013-04-02 DIAGNOSIS — Z791 Long term (current) use of non-steroidal anti-inflammatories (NSAID): Secondary | ICD-10-CM | POA: Insufficient documentation

## 2013-04-02 NOTE — ED Notes (Signed)
Pt states fell from desk onto left ankle. C/o pain and swelling to ankle

## 2013-04-03 ENCOUNTER — Emergency Department (HOSPITAL_COMMUNITY)
Admission: EM | Admit: 2013-04-03 | Discharge: 2013-04-03 | Disposition: A | Discharge status: Home / Self Care | Payer: BC Managed Care – PPO | Attending: Emergency Medicine | Admitting: Emergency Medicine

## 2013-04-03 ENCOUNTER — Emergency Department (HOSPITAL_COMMUNITY): Payer: BC Managed Care – PPO

## 2013-04-03 ENCOUNTER — Encounter (HOSPITAL_COMMUNITY): Payer: Self-pay | Admitting: Emergency Medicine

## 2013-04-03 DIAGNOSIS — R296 Repeated falls: Secondary | ICD-10-CM | POA: Insufficient documentation

## 2013-04-03 DIAGNOSIS — S92002S Unspecified fracture of left calcaneus, sequela: Secondary | ICD-10-CM

## 2013-04-03 DIAGNOSIS — S92009A Unspecified fracture of unspecified calcaneus, initial encounter for closed fracture: Secondary | ICD-10-CM | POA: Insufficient documentation

## 2013-04-03 DIAGNOSIS — Z791 Long term (current) use of non-steroidal anti-inflammatories (NSAID): Secondary | ICD-10-CM | POA: Insufficient documentation

## 2013-04-03 DIAGNOSIS — I10 Essential (primary) hypertension: Secondary | ICD-10-CM | POA: Insufficient documentation

## 2013-04-03 DIAGNOSIS — K219 Gastro-esophageal reflux disease without esophagitis: Secondary | ICD-10-CM | POA: Insufficient documentation

## 2013-04-03 DIAGNOSIS — G8929 Other chronic pain: Secondary | ICD-10-CM | POA: Insufficient documentation

## 2013-04-03 DIAGNOSIS — F411 Generalized anxiety disorder: Secondary | ICD-10-CM | POA: Insufficient documentation

## 2013-04-03 DIAGNOSIS — G43909 Migraine, unspecified, not intractable, without status migrainosus: Secondary | ICD-10-CM | POA: Insufficient documentation

## 2013-04-03 DIAGNOSIS — Z8739 Personal history of other diseases of the musculoskeletal system and connective tissue: Secondary | ICD-10-CM | POA: Insufficient documentation

## 2013-04-03 DIAGNOSIS — Z7982 Long term (current) use of aspirin: Secondary | ICD-10-CM | POA: Insufficient documentation

## 2013-04-03 DIAGNOSIS — Y939 Activity, unspecified: Secondary | ICD-10-CM | POA: Insufficient documentation

## 2013-04-03 DIAGNOSIS — Z79899 Other long term (current) drug therapy: Secondary | ICD-10-CM | POA: Insufficient documentation

## 2013-04-03 DIAGNOSIS — M79672 Pain in left foot: Secondary | ICD-10-CM

## 2013-04-03 DIAGNOSIS — Y9289 Other specified places as the place of occurrence of the external cause: Secondary | ICD-10-CM | POA: Insufficient documentation

## 2013-04-03 DIAGNOSIS — Z8619 Personal history of other infectious and parasitic diseases: Secondary | ICD-10-CM | POA: Insufficient documentation

## 2013-04-03 MED ORDER — HYDROMORPHONE HCL PF 2 MG/ML IJ SOLN
2.0000 mg | Freq: Once | INTRAMUSCULAR | Status: AC
  Administered 2013-04-03: 2 mg via INTRAMUSCULAR
  Filled 2013-04-03: qty 1

## 2013-04-03 MED ORDER — HYDROMORPHONE HCL 2 MG PO TABS: 2.0000 mg | ORAL_TABLET | Freq: Once | ORAL | Status: DC

## 2013-04-03 MED ORDER — HYDROMORPHONE HCL 4 MG PO TABS: 2.0000 mg | ORAL_TABLET | ORAL | Status: DC | PRN

## 2013-04-03 MED ORDER — HYDROMORPHONE HCL PF 2 MG/ML IJ SOLN
2.0000 mg | Freq: Once | INTRAMUSCULAR | Status: AC
  Administered 2013-04-03: 2 mg via INTRAVENOUS
  Filled 2013-04-03: qty 1

## 2013-04-03 MED ORDER — HYDROMORPHONE HCL 2 MG PO TABS
2.0000 mg | ORAL_TABLET | Freq: Once | ORAL | Status: AC
  Administered 2013-04-03: 2 mg via ORAL
  Filled 2013-04-03: qty 1

## 2013-04-03 MED ORDER — HYDROMORPHONE HCL PF 1 MG/ML IJ SOLN: 1.0000 mg | Freq: Once | INTRAMUSCULAR | Status: DC

## 2013-04-03 MED ORDER — KETOROLAC TROMETHAMINE 30 MG/ML IJ SOLN
30.0000 mg | Freq: Once | INTRAMUSCULAR | Status: AC
  Administered 2013-04-03: 30 mg via INTRAVENOUS
  Filled 2013-04-03: qty 1

## 2013-04-03 MED ORDER — ONDANSETRON HCL 4 MG/2ML IJ SOLN
4.0000 mg | Freq: Once | INTRAMUSCULAR | Status: AC
  Administered 2013-04-03: 4 mg via INTRAVENOUS
  Filled 2013-04-03: qty 2

## 2013-04-03 NOTE — Progress Notes (Signed)
Orthopedic Tech Progress Note Patient Details:  Sherry [name] 07/18/1960 [id number]  Ortho Devices Type of Ortho Device: Short leg splint;Crutches   [name] 04/03/2013, 2:39 AM

## 2013-04-03 NOTE — ED Provider Notes (Addendum)
CSN: [id number]     Arrival date & time 04/02/13  2324 History   First MD Initiated Contact with Patient 04/03/13 0018     Chief Complaint  Patient presents with  . Ankle Injury   (Consider location/radiation/quality/duration/timing/severity/associated sxs/prior Treatment) HPI This is a 52 year old female who was standing on a desk about 10 PM when the desk began to tilt. She tried to catch herself but fell, landing on her left foot. She is having severe pain in her left heel. There is associated swelling. She is not able to bear weight on that foot. Pain is worse with movement or palpation. She denies other injury.  Past Medical History  Diagnosis Date  . Anxiety   . Chronic pain   . Acid reflux disease   . Migraine   . Sleep apnea   . Fibromyalgia   . Hypertension   . Trichomonas contact, treated    Past Surgical History  Procedure Laterality Date  . Cholecystectomy      2008  . Total abdominal hysterectomy      1986  . Wisdom tooth extraction     Family History  Problem Relation Age of Onset  . Diabetes Mother   . Hypertension Mother   . Depression Mother   . Diabetes Sister   . Hypertension Father   . Cancer Father     Prostate  . Alcohol abuse Father   . Cancer Brother     Prostate   History  Substance Use Topics  . Smoking status: Never Smoker   . Smokeless tobacco: Not on file  . Alcohol Use: No   OB History   Grav Para Term Preterm Abortions TAB SAB Ect Mult Living   2 2 2       2      Review of Systems  All other systems reviewed and are negative.    Allergies  Review of patient's allergies indicates no known allergies.  Home Medications   Current Outpatient Rx  Name  Route  Sig  Dispense  Refill  . ALPRAZolam (XANAX) 1 MG tablet   Oral   Take 1 tablet (1 mg total) by mouth 2 (two) times daily as needed for sleep or anxiety.   30 tablet   1   . aspirin 81 MG chewable tablet   Oral   Chew 81 mg by mouth every morning.          Marland Kitchen  aspirin-acetaminophen-caffeine (EXCEDRIN MIGRAINE) 250-250-65 MG per tablet   Oral   Take 1 tablet by mouth every 6 (six) hours as needed for pain.         . cyclobenzaprine (FLEXERIL) 10 MG tablet   Oral   Take 10 mg by mouth daily as needed for muscle spasms.         Marland Kitchen esomeprazole (NEXIUM) 20 MG capsule   Oral   Take 1 capsule (20 mg total) by mouth daily before breakfast.   30 capsule   5   . lisinopril-hydrochlorothiazide (PRINZIDE,ZESTORETIC) 20-25 MG per tablet   Oral   Take 1 tablet by mouth daily.   90 tablet   3   . naproxen (NAPROSYN) 500 MG tablet   Oral   Take 1 tablet (500 mg total) by mouth 2 (two) times daily.   60 tablet   1   . promethazine (PHENERGAN) 25 MG tablet   Oral   Take 25 mg by mouth every 6 (six) hours as needed for nausea.         Marland Kitchen  zolpidem (AMBIEN) 10 MG tablet      TAKE 1 TABLET BY MOUTH AT BEDTIME AS NEEDED FOR SLEEP   30 tablet   2   . zolpidem (AMBIEN) 10 MG tablet   Oral   Take 10 mg by mouth at bedtime as needed for sleep.         Marland Kitchen gabapentin (NEURONTIN) 100 MG capsule   Oral   Take 1 capsule (100 mg total) by mouth 2 (two) times daily. Take one at night for one week. If tolerated, increase to twice daily   60 capsule   3    BP 147/97  Pulse 110  Temp(Src) 98.1 F (36.7 C) (Oral)  Ht 5' (1.524 m)  Wt 179 lb (81.194 kg)  BMI 34.96 kg/m2  SpO2 97%  Physical Exam General: Well-developed, well-nourished female in no acute distress; appearance consistent with age of record HENT: normocephalic; atraumatic Eyes: pupils equal, round and reactive to light; extraocular muscles intact Neck: supple; nontender Heart: regular rate and rhythm Lungs: clear to auscultation bilaterally Abdomen: soft; nondistended; nontender; no masses or hepatosplenomegaly Extremities: Tenderness, swelling and ecchymosis of left heel and ankle with decreased range of motion of left ankle due to pain, left foot distally neurovascularly intact  with intact tendon function Neurologic: Awake, alert and oriented; motor function intact in all extremities and symmetric; no facial droop Skin: Warm and dry Psychiatric: Normal mood and affect    ED Course  Procedures (including critical care time)    MDM  Nursing notes and vitals signs, including pulse oximetry, reviewed.  Summary of this visit's results, reviewed by myself:  Imaging Studies: Dg Ankle Complete Left  04/15/13   CLINICAL DATA:  Fall with ankle pain.  EXAM: LEFT ANKLE COMPLETE - 3+ VIEW  COMPARISON:  None.  FINDINGS: Comminuted fracture of the calcaneus, with fracture distraction laterally. The fracture involves the posterior calcaneus the, with multiple fracture planes exiting the tuberosity- as well as the anterior process. No clear subtalar joint extension.  IMPRESSION: Comminuted fracturing of the calcaneus, both the anterior process and body. CT would better evaluate the subtalar joint.   Electronically Signed   By: Tiburcio Pea M.D.   On: 04/15/13 00:03   Ct Foot Left Wo Contrast  04-15-2013   CLINICAL DATA:  Pain post fall.  EXAM: CT OF THE LEFT FOOT WITHOUT CONTRAST  TECHNIQUE: Multidetector CT imaging was performed according to the standard protocol. Multiplanar CT image reconstructions were also generated.  COMPARISON:  Previous day's radiographs  FINDINGS: Markedly comminuted calcaneal fracture. There are fragments of the posterior calcaneal tuberosity with fragments displaced up to 5 mm. The sustentaculum tali is separate fracture fragment. There is a minimally displaced fracture which involves the subchondral cortex of the anterior articular surface of the talus. There is widening of the posterior talocalcaneal articulation. Remainder of the foot bones are intact. Normal mineralization and alignment.  IMPRESSION: 1. Comminuted displaced calcaneal fracture with intra-articular involvement as above.   Electronically Signed   By: Oley Balm M.D.   On:  2013-04-15 01:08   2:56 AM Patient placed in short leg splint by orthopedic technician. Left dorsalis pedis pulse +2; sensation intact in toes; capillary refill brisk in toes.     Hanley Seamen, MD 2013/04/15 1610  Hanley Seamen, MD 15-Apr-2013 [phone]

## 2013-04-03 NOTE — ED Notes (Signed)
Pt sleeping. Pt wakes up and states pain is 7/10. Pt's family at bedside.

## 2013-04-03 NOTE — ED Provider Notes (Signed)
CSN: [id number]     Arrival date & time 04/03/13  2000 History   First MD Initiated Contact with Patient 04/03/13 2016     Chief Complaint  Patient presents with  . Foot Pain   (Consider location/radiation/quality/duration/timing/severity/associated sxs/prior Treatment) HPI Comments: Patient here s/p fall off a desk last night with a fracture to her left calcaneous.  She reports she was given dilaudid 4mg  tablets for pain which are not helping her pain.  She reports no decrease in sensation to the foot, nausea, vomiting.  She reports a history of chronic pain mainly in her lower extremities.  She states she is not trying to bear weight on the extremity, has called the orthopedist on call and has an appointment with them tomorrow.  Patient is a 52 y.o. female presenting with lower extremity pain. The history is provided by the patient. No language interpreter was used.  Foot Pain This is a new problem. The current episode started yesterday. The problem occurs constantly. The problem has been unchanged. Associated symptoms include arthralgias and myalgias. Pertinent negatives include no abdominal pain, chest pain, congestion, coughing, headaches, joint swelling, neck pain, numbness, rash, swollen glands, vertigo, visual change, vomiting or weakness. Nothing aggravates the symptoms. She has tried nothing for the symptoms. The treatment provided no relief.    Past Medical History  Diagnosis Date  . Anxiety   . Chronic pain   . Acid reflux disease   . Migraine   . Sleep apnea   . Fibromyalgia   . Hypertension   . Trichomonas contact, treated    Past Surgical History  Procedure Laterality Date  . Cholecystectomy      2008  . Total abdominal hysterectomy      1986  . Wisdom tooth extraction     Family History  Problem Relation Age of Onset  . Diabetes Mother   . Hypertension Mother   . Depression Mother   . Diabetes Sister   . Hypertension Father   . Cancer Father     Prostate  .  Alcohol abuse Father   . Cancer Brother     Prostate   History  Substance Use Topics  . Smoking status: Never Smoker   . Smokeless tobacco: Not on file  . Alcohol Use: No   OB History   Grav Para Term Preterm Abortions TAB SAB Ect Mult Living   2 2 2       2      Review of Systems  HENT: Negative for congestion.   Respiratory: Negative for cough.   Cardiovascular: Negative for chest pain.  Gastrointestinal: Negative for vomiting and abdominal pain.  Musculoskeletal: Positive for arthralgias and myalgias. Negative for joint swelling and neck pain.  Skin: Negative for rash.  Neurological: Negative for vertigo, weakness, numbness and headaches.  All other systems reviewed and are negative.    Allergies  Review of patient's allergies indicates no known allergies.  Home Medications   Current Outpatient Rx  Name  Route  Sig  Dispense  Refill  . ALPRAZolam (XANAX) 1 MG tablet   Oral   Take 1 tablet (1 mg total) by mouth 2 (two) times daily as needed for sleep or anxiety.   30 tablet   1   . aspirin 81 MG chewable tablet   Oral   Chew 81 mg by mouth every morning.          Marland Kitchen aspirin-acetaminophen-caffeine (EXCEDRIN MIGRAINE) 250-250-65 MG per tablet   Oral   Take  1 tablet by mouth every 6 (six) hours as needed for pain.         . cyclobenzaprine (FLEXERIL) 10 MG tablet   Oral   Take 10 mg by mouth daily as needed for muscle spasms.         Marland Kitchen esomeprazole (NEXIUM) 20 MG capsule   Oral   Take 1 capsule (20 mg total) by mouth daily before breakfast.   30 capsule   5   . HYDROmorphone (DILAUDID) 4 MG tablet   Oral   Take 0.5-1 tablets (2-4 mg total) by mouth every 4 (four) hours as needed for pain.   30 tablet   0   . lisinopril-hydrochlorothiazide (PRINZIDE,ZESTORETIC) 20-25 MG per tablet   Oral   Take 1 tablet by mouth daily.   90 tablet   3   . naproxen (NAPROSYN) 500 MG tablet   Oral   Take 1 tablet (500 mg total) by mouth 2 (two) times daily.    60 tablet   1   . promethazine (PHENERGAN) 25 MG tablet   Oral   Take 25 mg by mouth every 6 (six) hours as needed for nausea.         Marland Kitchen zolpidem (AMBIEN) 10 MG tablet   Oral   Take 10 mg by mouth at bedtime as needed for sleep.         Marland Kitchen gabapentin (NEURONTIN) 100 MG capsule   Oral   Take 1 capsule (100 mg total) by mouth 2 (two) times daily. Take one at night for one week. If tolerated, increase to twice daily   60 capsule   3    BP 168/93  Pulse 125  Temp(Src) 99.1 F (37.3 C) (Oral)  Resp 24  SpO2 95% Physical Exam  Nursing note and vitals reviewed. Constitutional: She is oriented to person, place, and time. She appears well-developed and well-nourished. No distress.  HENT:  Head: Normocephalic and atraumatic.  Nose: Nose normal.  Mouth/Throat: Oropharynx is clear and moist.  Eyes: Conjunctivae are normal. No scleral icterus.  Musculoskeletal: She exhibits tenderness.       Left foot: She exhibits decreased range of motion, tenderness and swelling. She exhibits normal capillary refill.       Feet:  Posterior splint intact, toes warm to touch, sensation intact, cap refill < 3 sec  Neurological: She is alert and oriented to person, place, and time. She exhibits normal muscle tone. Coordination normal.  Skin: Skin is warm and dry. No rash noted. No erythema. No pallor.  Psychiatric: She has a normal mood and affect. Her behavior is normal. Judgment and thought content normal.    ED Course  Procedures (including critical care time) Labs Review Labs Reviewed - No data to display Imaging Review Dg Ankle Complete Left  04/03/2013   CLINICAL DATA:  Fall with ankle pain.  EXAM: LEFT ANKLE COMPLETE - 3+ VIEW  COMPARISON:  None.  FINDINGS: Comminuted fracture of the calcaneus, with fracture distraction laterally. The fracture involves the posterior calcaneus the, with multiple fracture planes exiting the tuberosity- as well as the anterior process. No clear subtalar  joint extension.  IMPRESSION: Comminuted fracturing of the calcaneus, both the anterior process and body. CT would better evaluate the subtalar joint.   Electronically Signed   By: Tiburcio Pea M.D.   On: 04/03/2013 00:03   Ct Foot Left Wo Contrast  04/03/2013   CLINICAL DATA:  Pain post fall.  EXAM: CT OF THE LEFT FOOT WITHOUT  CONTRAST  TECHNIQUE: Multidetector CT imaging was performed according to the standard protocol. Multiplanar CT image reconstructions were also generated.  COMPARISON:  Previous day's radiographs  FINDINGS: Markedly comminuted calcaneal fracture. There are fragments of the posterior calcaneal tuberosity with fragments displaced up to 5 mm. The sustentaculum tali is separate fracture fragment. There is a minimally displaced fracture which involves the subchondral cortex of the anterior articular surface of the talus. There is widening of the posterior talocalcaneal articulation. Remainder of the foot bones are intact. Normal mineralization and alignment.  IMPRESSION: 1. Comminuted displaced calcaneal fracture with intra-articular involvement as above.   Electronically Signed   By: Oley Balm M.D.   On: 04/03/2013 01:08    EKG Interpretation   None     10:05 PM Patient reports pain in better control, will discharge home with current medications, she will be following up with ortho in the morning.  MDM  Calcaneous fracture  Patient with history of chronic pain presents with worsening left heel pain s/p fracture, pain not adequately controlled with home medication, given pain relief here and will keep follow up appointment with ortho in the morning.   Izola Price Marisue Humble, [place] 04/03/13 2207

## 2013-04-03 NOTE — ED Notes (Signed)
Pt was seen here last night after having a fall and broke her left foot  Pt states that she continues to have a lot of pain in that foot despite the medications

## 2013-04-04 DIAGNOSIS — S92009A Unspecified fracture of unspecified calcaneus, initial encounter for closed fracture: Secondary | ICD-10-CM

## 2013-04-04 NOTE — ED Provider Notes (Signed)
Medical screening examination/treatment/procedure(s) were conducted as a shared visit with non-physician practitioner(s) and myself.  I personally evaluated the patient during the encounter.  EKG Interpretation   None       Pt s/p recent calcaneus fx. Persistent pain in heel. States closed injury, no lacs. No numbness/weakness in foot. Elevate. Ice. Pain rx. Pt has ortho f/u tomorrow. dp 2+.   Suzi Roots, MD 04/04/13 [phone]

## 2013-04-08 HISTORY — PX: TRANSTHORACIC ECHOCARDIOGRAM: SHX275

## 2013-04-10 ENCOUNTER — Encounter: Payer: Self-pay | Admitting: Physical Medicine & Rehabilitation

## 2013-04-10 ENCOUNTER — Encounter: Payer: Self-pay | Attending: Physical Medicine & Rehabilitation | Admitting: Physical Medicine & Rehabilitation

## 2013-04-10 VITALS — BP 158/88 | HR 107 | Resp 14 | Ht 60.0 in | Wt 179.0 lb

## 2013-04-10 DIAGNOSIS — M25532 Pain in left wrist: Secondary | ICD-10-CM

## 2013-04-10 DIAGNOSIS — M25539 Pain in unspecified wrist: Secondary | ICD-10-CM | POA: Insufficient documentation

## 2013-04-10 DIAGNOSIS — M47816 Spondylosis without myelopathy or radiculopathy, lumbar region: Secondary | ICD-10-CM

## 2013-04-10 DIAGNOSIS — IMO0001 Reserved for inherently not codable concepts without codable children: Secondary | ICD-10-CM | POA: Insufficient documentation

## 2013-04-10 DIAGNOSIS — IMO0002 Reserved for concepts with insufficient information to code with codable children: Secondary | ICD-10-CM

## 2013-04-10 DIAGNOSIS — S92002A Unspecified fracture of left calcaneus, initial encounter for closed fracture: Secondary | ICD-10-CM | POA: Insufficient documentation

## 2013-04-10 DIAGNOSIS — S92002K Unspecified fracture of left calcaneus, subsequent encounter for fracture with nonunion: Secondary | ICD-10-CM

## 2013-04-10 DIAGNOSIS — M47817 Spondylosis without myelopathy or radiculopathy, lumbosacral region: Secondary | ICD-10-CM | POA: Insufficient documentation

## 2013-04-10 DIAGNOSIS — M797 Fibromyalgia: Secondary | ICD-10-CM

## 2013-04-10 MED ORDER — CYCLOBENZAPRINE HCL 10 MG PO TABS: 10.0000 mg | ORAL_TABLET | Freq: Two times a day (BID) | ORAL | Status: DC | PRN

## 2013-04-10 NOTE — Progress Notes (Signed)
Subjective:    Patient ID: Sherry [name], female    DOB: Mar 07, 1961, 52 y.o.   MRN: [id number]  HPI  Mrs. Shrider is back regarding her chronic pain. She had a fall last week at home when she was reaching up to get something out of a cabinet and she fractured her left calcaneus when she fell 3 or 4 feet down.  She is NWB currently and following up with Dr. Carola Frost and may need surgical fixation.   She only took one dose of gabapentin because a friend/family member had a problem with it.   Dr. Carola Frost rx'ed her percocet for pain control which seems to be helping.   She was given robaxin for muscle spasms which hasn't been as effective as her flexeril.   Her sister, who is present today, feels that her mood has been pretty negative since the fall.   Xrays of her wrist were negative for OA or inflammatory arthritis.    Pain Inventory Average Pain 8 Pain Right Now 8 My pain is constant, burning and aching  In the last 24 hours, has pain interfered with the following? General activity 10 Relation with others 10 Enjoyment of life 10 What TIME of day is your pain at its worst? all Sleep (in general) Fair  Pain is worse with: walking, bending, sitting, inactivity, standing and some activites Pain improves with: medication Relief from Meds: 6  Mobility ability to climb steps?  no do you drive?  no use a wheelchair needs help with transfers  Function I need assistance with the following:  dressing, bathing, meal prep, household duties and shopping  Neuro/Psych spasms  Prior Studies Any changes since last visit?  yes  xrays related to broken foot  Physicians involved in your care Any changes since last visit?  yes   Family History  Problem Relation Age of Onset  . Diabetes Mother   . Hypertension Mother   . Depression Mother   . Diabetes Sister   . Hypertension Father   . Cancer Father     Prostate  . Alcohol abuse Father   . Cancer Brother     Prostate    History   Social History  . Marital Status: Single    Spouse Name: N/A    Number of Children: N/A  . Years of Education: N/A   Occupational History  . Unemployed    Social History Main Topics  . Smoking status: Never Smoker   . Smokeless tobacco: Never Used  . Alcohol Use: No  . Drug Use: No  . Sexual Activity: Not Currently    Birth Control/ Protection: None     Comment: last intercouse two months ago   Other Topics Concern  . None   Social History Narrative   Lives with   2 Children: Ages 60 and 77   4 Grandchildren: 2, 3, 4 and 5   Brother      Diet: "Barely eats". Does not exercise due to pain.      03/2011- Currently unemployed. 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 Unsure of last pap.   Unsure of last Td   Unsure of last flu shot   Past Surgical History  Procedure Laterality Date  . Cholecystectomy      2008  . Total abdominal hysterectomy      1986  . Wisdom tooth extraction     Past Medical History  Diagnosis Date  . Anxiety   . Chronic pain   . Acid reflux disease   . Migraine   . Sleep apnea   . Fibromyalgia   . Hypertension   . Trichomonas contact, treated    BP 158/88  Pulse 107  Resp 14  Ht 5' (1.524 m)  Wt 179 lb (81.194 kg)  BMI 34.96 kg/m2  SpO2 98%    Review of Systems  Musculoskeletal: Positive for gait problem.       Spasm  All other systems reviewed and are negative.       Objective:   Physical Exam General: Alert and oriented x 3, No apparent distress. Slightly overweight.  HEENT: Head is normocephalic, atraumatic, PERRLA, EOMI, sclera anicteric, oral mucosa pink and moist, dentition intact, ext ear canals clear,  Neck: Supple without JVD or lymphadenopathy  Heart: Reg rate and rhythm. No murmurs rubs or gallops  Chest: CTA bilaterally without wheezes, rales, or rhonchi; no distress  Abdomen: Soft, non-tender, non-distended, bowel sounds positive.   Extremities: No clubbing, cyanosis, or edema. Pulses are 1+ in both feet, both feet were cool however. No vascular changes.  Skin: Clean and intact without signs of breakdown  Neuro: sensation grossly intact but both legs are hypersensitive almost allodynic to touch. Strength functional in all 4.  Musculoskeletal: head forward posture. Tight right trap, tender to touch. Legs hypersensitive. Can forward flex at waist to 80 degrees. Left foot/heel sore with elevation and basic palpation. Had pain with moving foot back to leg rest in w/c.  Psych: Pt's affect is a little irritable.   Assessment & Plan:   1. Chronic pain syndrome/ fibromyalgia although pain seems to center more in legs/feet.  2. Left wrist pain, subacute  3. Low back pain.  4. Comminuted left calcaneal fx after fall   Plan:  1. Left calcaneal fx mgt per ortho. It appears this will need surgical fixation 2. Hold on trial of gabapentin for now.  3. Percocet per Dr. Carola Frost until he is done caring for her heel fx 4. Naproxen 500mg  bid for wrist pain--recommend holding for now 5. Wear wrist splint at work  6. DHEA, Mg++, co-q 10 supplements are still options.  7. Aerobic exercise is off the table for now  8. Consider lab work up for other causes of neuropathic/central pain  9. Follow up in 2 month. 25 minutes of face to face patient care time were spent during this visit. All questions were encouraged and answered.

## 2013-04-10 NOTE — Patient Instructions (Signed)
CALL ME WITH ANY PROBLEMS OR QUESTIONS (#[phone]).  HAVE A GOOD DAY  

## 2013-04-18 ENCOUNTER — Other Ambulatory Visit (HOSPITAL_COMMUNITY): Payer: Self-pay | Admitting: *Deleted

## 2013-04-18 ENCOUNTER — Encounter (HOSPITAL_COMMUNITY): Payer: Self-pay | Admitting: Pharmacy Technician

## 2013-04-18 NOTE — Pre-Procedure Instructions (Signed)
Sherry [name]  04/18/2013   Your procedure is scheduled on:  Thursday, April 20, 2013 at 1:45 PM.   Report to [facility] Entrance "A" at 11:45 AM.   Call this number if you have problems the morning of surgery: [phone]   Remember:   Do not eat food or drink liquids after midnight tonight, 04/19/13.   Take these medicines the morning of surgery with A SIP OF WATER: esomeprazole (NEXIUM), gabapentin (NEURONTIN),  cyclobenzaprine (FLEXERIL) - if needed, oxyCODONE-acetaminophen (PERCOCET/ROXICET) - if needed, promethazine (PHENERGAN) - if needed, ALPRAZolam Prudy Feeler) - if needed.                 Do not wear jewelry, make-up or nail polish.  Do not wear lotions, powders, or perfumes. You may wear deodorant.  Do not shave 48 hours prior to surgery.   Do not bring valuables to the hospital.  [facility] is not responsible                  for any belongings or valuables.               Contacts, dentures or bridgework may not be worn into surgery.  Leave suitcase in the car. After surgery it may be brought to your room.  For patients admitted to the hospital, discharge time is determined by your                treatment team.                Special Instructions: Shower using CHG 2 nights before surgery and the night before surgery.  If you shower the day of surgery use CHG.  Use special wash - you have one bottle of CHG for all showers.  You should use approximately 1/3 of the bottle for each shower.   Please read over the following fact sheets that you were given: Pain Booklet, Coughing and Deep Breathing and Surgical Site Infection Prevention

## 2013-04-19 ENCOUNTER — Encounter (HOSPITAL_COMMUNITY): Payer: Self-pay

## 2013-04-19 ENCOUNTER — Encounter (HOSPITAL_COMMUNITY)
Admission: RE | Admit: 2013-04-19 | Discharge: 2013-04-19 | Disposition: A | Discharge status: Home / Self Care | Payer: BC Managed Care – PPO | Source: Ambulatory Visit | Attending: Orthopedic Surgery | Admitting: Orthopedic Surgery

## 2013-04-19 ENCOUNTER — Encounter (HOSPITAL_COMMUNITY): Payer: Self-pay | Admitting: Vascular Surgery

## 2013-04-19 DIAGNOSIS — Z01812 Encounter for preprocedural laboratory examination: Secondary | ICD-10-CM | POA: Insufficient documentation

## 2013-04-19 DIAGNOSIS — Z01810 Encounter for preprocedural cardiovascular examination: Secondary | ICD-10-CM | POA: Insufficient documentation

## 2013-04-19 DIAGNOSIS — Z01818 Encounter for other preprocedural examination: Secondary | ICD-10-CM | POA: Insufficient documentation

## 2013-04-19 HISTORY — DX: Personal history of other medical treatment: Z92.89

## 2013-04-19 HISTORY — DX: Malignant (primary) neoplasm, unspecified: C80.1

## 2013-04-19 HISTORY — DX: Major depressive disorder, single episode, unspecified: F32.9

## 2013-04-19 HISTORY — DX: Personal history of urinary calculi: Z87.442

## 2013-04-19 HISTORY — DX: Angina pectoris, unspecified: I20.9

## 2013-04-19 HISTORY — DX: Depression, unspecified: F32.A

## 2013-04-19 LAB — URINALYSIS, ROUTINE W REFLEX MICROSCOPIC
Glucose, UA: NEGATIVE mg/dL
Ketones, ur: NEGATIVE mg/dL
Leukocytes, UA: NEGATIVE
Specific Gravity, Urine: 1.007 (ref 1.005–1.030)
Urobilinogen, UA: 1 mg/dL (ref 0.0–1.0)
pH: 6.5 (ref 5.0–8.0)

## 2013-04-19 LAB — COMPREHENSIVE METABOLIC PANEL
ALT: 36 U/L — ABNORMAL HIGH (ref 0–35)
AST: 30 U/L (ref 0–37)
Calcium: 10.2 mg/dL (ref 8.4–10.5)
GFR calc Af Amer: >90 mL/min (ref >90)
Potassium: 4 mEq/L (ref 3.5–5.1)
Sodium: 138 mEq/L (ref 135–145)
Total Protein: 8.4 g/dL — ABNORMAL HIGH (ref 6.0–8.3)

## 2013-04-19 LAB — CBC WITH DIFFERENTIAL/PLATELET
Basophils Absolute: 0 10*3/uL (ref 0.0–0.1)
Eosinophils Absolute: 0.1 10*3/uL (ref 0.0–0.7)
Eosinophils Relative: 2 % (ref 0–5)
Hemoglobin: 12.9 g/dL (ref 12.0–15.0)
Lymphocytes Relative: 33 % (ref 12–46)
MCV: 90 fL (ref 78.0–100.0)
Monocytes Relative: 6 % (ref 3–12)
Neutro Abs: 4.6 10*3/uL (ref 1.7–7.7)
Neutrophils Relative %: 59 % (ref 43–77)
Platelets: 569 10*3/uL — ABNORMAL HIGH (ref 150–400)
RDW: 13.1 % (ref 11.5–15.5)
WBC: 7.8 10*3/uL (ref 4.0–10.5)

## 2013-04-19 LAB — APTT: aPTT: 32 seconds (ref 24–37)

## 2013-04-19 LAB — PROTIME-INR: INR: 1.04 (ref 0.00–1.49)

## 2013-04-19 MED ORDER — CEFAZOLIN SODIUM-DEXTROSE 2-3 GM-% IV SOLR: 2.0000 g | INTRAVENOUS | Status: DC

## 2013-04-19 NOTE — Pre-Procedure Instructions (Signed)
Weronika T Adrian  04/19/2013   Your procedure is scheduled on:  Thursday, April 20, 2013 at 11:45 PM.   Report to [facility] Entrance "A" at 9:45 AM.   Call this number if you have problems the morning of surgery: [phone]   Remember:   Do not eat food or drink liquids after midnight tonight, 04/19/13.   Take these medicines the morning of surgery with A SIP OF WATER: esomeprazole (NEXIUM), gabapentin (NEURONTIN),  cyclobenzaprine (FLEXERIL) - if needed, oxyCODONE-acetaminophen (PERCOCET/ROXICET) - if needed, promethazine (PHENERGAN) - if needed, ALPRAZolam (XANAX) - if needed.               Do not wear jewelry, make-up or nail polish.  Do not wear lotions, powders, or perfumes. You may wear deodorant.  Do not shave 48 hours prior to surgery.   Do not bring valuables to the hospital.  [facility] is not responsible for any belongings or valuables.               Contacts, dentures or bridgework may not be worn into surgery.  Leave suitcase in the car. After surgery it may be brought to your room.  For patients admitted to the hospital, discharge time is determined by your  treatment team.                Special Instructions: Shower using CHG 2 nights before surgery and the night before surgery.  If you shower the day of surgery use CHG.  Use special wash - you have one bottle of CHG for all showers.  You should use approximately 1/3 of the bottle for each shower.   Please read over the following fact sheets that you were given: Pain Booklet, Coughing and Deep Breathing and Surgical Site Infection Prevention     

## 2013-04-19 NOTE — Progress Notes (Signed)
Pt reported mid sternal chest pain on 04/18/13.  Pain was a "5" out of 10.  Patient denies any shortness of breath, dizziness, lightheadedness or diaphoresis.Patient reported that she thinks it was due to being anxious.  She also reported that she has experienced this pain before.  I reported this to [employer].

## 2013-04-19 NOTE — Pre-Procedure Instructions (Signed)
KOA PALLA  04/19/2013   Your procedure is scheduled on:  Thursday, April 20, 2013 at 11:45 PM.   Report to [facility] Entrance "A" at 9:45 AM.   Call this number if you have problems the morning of surgery: [phone]   Remember:   Do not eat food or drink liquids after midnight tonight, 04/19/13.   Take these medicines the morning of surgery with A SIP OF WATER: esomeprazole (NEXIUM), gabapentin (NEURONTIN),  cyclobenzaprine (FLEXERIL) - if needed, oxyCODONE-acetaminophen (PERCOCET/ROXICET) - if needed, promethazine (PHENERGAN) - if needed, ALPRAZolam Prudy Feeler) - if needed.               Do not wear jewelry, make-up or nail polish.  Do not wear lotions, powders, or perfumes. You may wear deodorant.  Do not shave 48 hours prior to surgery.   Do not bring valuables to the hospital.  [facility] is not responsible for any belongings or valuables.               Contacts, dentures or bridgework may not be worn into surgery.  Leave suitcase in the car. After surgery it may be brought to your room.  For patients admitted to the hospital, discharge time is determined by your  treatment team.                Special Instructions: Shower using CHG 2 nights before surgery and the night before surgery.  If you shower the day of surgery use CHG.  Use special wash - you have one bottle of CHG for all showers.  You should use approximately 1/3 of the bottle for each shower.   Please read over the following fact sheets that you were given: Pain Booklet, Coughing and Deep Breathing and Surgical Site Infection Prevention

## 2013-04-19 NOTE — Progress Notes (Signed)
Anesthesia PAT Evaluation:  Patient is a 52 year old female scheduled for ORIF of left calcaneous fracture on 04/20/13 by Dr. Carola Frost.  History includes obesity (BMI 34), non-smoker, OSA, fibromyalgia, HTN, migraines, uterine cancer s/p hysterectomy, GERD, anxiety, depression, cholecystectomy. She receives primary care thru [facility], primarily with Dr. Rodman Pickle, but last with Dr. Dessa Phi.    I was asked to see patient today because of reports of recent chest pain, last on yesterday.  She has had similar pains in the past for at least the past year.  She has attributed them to anxiety.  Pain occurs in the mid to left chest. She describes it as a "discomfort" or "pressure" that lasts less than 1 hour and rates at a 5/10.  There is no jaw or arm pain, palpitations, diaphoresis.  She has chronic intermittent nausea which does not seem to be associated with her chest pains.  Pains can occur with activity or rest.  Her father's sister died of an MI around age 59, but no known CAD on her mom's side.  She reports a normal stress test > 5 years ago at [facility] ordered by her PCP at the time. She denies chest pain, SOB, palpitations at present.  She has no known personal history of CHF/MI.  Meds: Xanax, ASA, Excedrin Migraine, Flexeril, Nexium, Neurontin, lisinopril/HCTZ, naproxen, oxycodone, Phenergan, Ambien.  EKG on 04/19/13 showed ST @ 117 bpm, incomplete right BBB, ST/T wave abnormality, consider inferior and anterior ischemia (age undetermined).  HR is faster and T wave abnormality more prominent when compared to prior EKG.  Preoperative CXR and labs noted.  Patient is pleasant black female in NAD.  She is sitting in a wheel chair.  Heart is regular but tachycardiac ~ 110's.  Lungs clear.  There is LLE edema primarily involving her lower leg/foot.  Trace RLE pre-tibial edema.    I spoke with anesthesiologist Dr. Jacklynn Bue, Dr. Carola Frost, and cardiologist Dr. Clifton James.   Patient will need preoperative cardiology evaluation due to chest pain, tachycardia with abnormal EKG.  Since she is currently asymptomatic it was not felt that she needed urgent evaluation.  Dr. Carola Frost did say that ideally surgery should be done before the end of next week if she is okay from a cardiac standpoint.  Dr. Clifton James has arranged for patient to see Dr. Tenny Craw on 04/21/13.  Patient knows to present to the ED sooner if she has recurrent chest pain or associated symptoms.  Her surgery for tomorrow will be canceled, and she will be rescheduled once she is cleared by cardiology.   Velna Ochs [facility]/Anesthesiology Phone [phone] 04/19/2013 6:08 PM

## 2013-04-20 ENCOUNTER — Ambulatory Visit (HOSPITAL_COMMUNITY): Admission: RE | Admit: 2013-04-20 | Payer: Self-pay | Source: Ambulatory Visit | Admitting: Orthopedic Surgery

## 2013-04-20 ENCOUNTER — Encounter (HOSPITAL_COMMUNITY): Admission: RE | Payer: Self-pay | Source: Ambulatory Visit

## 2013-04-20 SURGERY — OPEN REDUCTION INTERNAL FIXATION (ORIF) CALCANEOUS FRACTURE
Anesthesia: General | Laterality: Left

## 2013-04-21 ENCOUNTER — Ambulatory Visit (INDEPENDENT_AMBULATORY_CARE_PROVIDER_SITE_OTHER): Payer: Self-pay | Admitting: Internal Medicine

## 2013-04-21 ENCOUNTER — Encounter: Payer: Self-pay | Admitting: Internal Medicine

## 2013-04-21 VITALS — BP 127/88 | HR 116 | Ht 60.0 in | Wt 178.0 lb

## 2013-04-21 DIAGNOSIS — I10 Essential (primary) hypertension: Secondary | ICD-10-CM

## 2013-04-21 DIAGNOSIS — Z01818 Encounter for other preprocedural examination: Secondary | ICD-10-CM

## 2013-04-21 LAB — TSH: TSH: 0.61 u[IU]/mL (ref 0.35–5.50)

## 2013-04-21 NOTE — Patient Instructions (Signed)
Your physician recommends that you schedule a follow-up appointment in: AS NEEDED PENDING TEST RESULTS  Your physician has requested that you have an echocardiogram. Echocardiography is a painless test that uses sound waves to create images of your heart. It provides your doctor with information about the size and shape of your heart and how well your heart's chambers and valves are working. This procedure takes approximately one hour. There are no restrictions for this procedure.   Your physician recommends that you HAVE LAB WORK TODAY  

## 2013-04-21 NOTE — Progress Notes (Signed)
HPI Patient is a 52 yo with no history of CAD  She presents for preop risk stratification. She denies signif cp   Breathing is OK  She is not active now due to foot   Was not that active .  Denis dizziness prior to accident.    No Known Allergies  Current Outpatient Prescriptions  Medication Sig Dispense Refill  . ALPRAZolam (XANAX) 1 MG tablet Take 1 tablet (1 mg total) by mouth 2 (two) times daily as needed for sleep or anxiety.  30 tablet  1  . aspirin 81 MG chewable tablet Chew 81 mg by mouth every morning.       Marland Kitchen aspirin-acetaminophen-caffeine (EXCEDRIN MIGRAINE) 250-250-65 MG per tablet Take 1 tablet by mouth every 6 (six) hours as needed for pain.      . cyclobenzaprine (FLEXERIL) 10 MG tablet Take 1 tablet (10 mg total) by mouth 2 (two) times daily as needed for muscle spasms.  60 tablet  2  . esomeprazole (NEXIUM) 20 MG capsule Take 1 capsule (20 mg total) by mouth daily before breakfast.  30 capsule  5  . lisinopril-hydrochlorothiazide (PRINZIDE,ZESTORETIC) 20-25 MG per tablet Take 1 tablet by mouth daily.  90 tablet  3  . naproxen (NAPROSYN) 500 MG tablet Take 1 tablet (500 mg total) by mouth 2 (two) times daily.  60 tablet  1  . oxyCODONE (OXY IR/ROXICODONE) 5 MG immediate release tablet Take 5 mg by mouth every 4 (four) hours as needed for pain (one to two q 3 hr prn between percocet).      Marland Kitchen oxyCODONE-acetaminophen (PERCOCET/ROXICET) 5-325 MG per tablet Take 1 tablet by mouth every 4 (four) hours as needed for pain (one to two q 4-6 hr prn).      . promethazine (PHENERGAN) 25 MG tablet Take 25 mg by mouth every 6 (six) hours as needed for nausea.      Marland Kitchen gabapentin (NEURONTIN) 100 MG capsule Take 1 capsule (100 mg total) by mouth 2 (two) times daily. Take one at night for one week. If tolerated, increase to twice daily  60 capsule  3  . zolpidem (AMBIEN) 10 MG tablet Take 10 mg by mouth at bedtime as needed for sleep.       No current facility-administered medications for this  visit.    Past Medical History  Diagnosis Date  . Anxiety   . Chronic pain   . Acid reflux disease   . Sleep apnea   . Fibromyalgia   . Hypertension   . Trichomonas contact, treated   . Anginal pain     last time 04/18/13 in afternoon.  . Depression   . Migraine     last one was 04/18/13  . History of kidney stones     passed  . 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 History of blood transfusion 1981    childbirth    Past Surgical History  Procedure Laterality Date  . Cholecystectomy      2008  . Total abdominal hysterectomy      1986  . Wisdom tooth extraction      Family History  Problem Relation Age of Onset  . Diabetes Mother   . Hypertension Mother   . Depression Mother   . Diabetes Sister   . Hypertension Father   . Cancer Father     Prostate  . Alcohol abuse Father   . Cancer Brother     Prostate    History  Social History  . Marital Status: Single    Spouse Name: N/A    Number of Children: N/A  . Years of Education: N/A   Occupational History  . Unemployed    Social History Main Topics  . Smoking status: Never Smoker   . Smokeless tobacco: Never Used  . Alcohol Use: No  . Drug Use: No  . Sexual Activity: Not Currently    Birth Control/ Protection: None     Comment: last intercouse two months ago   Other Topics Concern  . Not on file   Social History Narrative   Lives with   2 Children: Ages 75 and 34   4 Grandchildren: 2, 3, 4 and 63   Brother      Diet: "Barely eats". Does not exercise due to pain.      03/2011- Currently unemployed. 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 Unsure of last pap.   Unsure of last Td   Unsure of last flu shot    Review of Systems:  All systems reviewed.  They are negative to the above problem except as previously stated.  Vital Signs: BP 127/88  Pulse 116  Ht 5' (1.524 m)  Wt 178 lb (80.74 kg)  BMI 34.76 kg/m2  Physical  Exam Patient is in NAD HEENT:  Normocephalic, atraumatic. EOMI, PERRLA.  Neck: JVP is normal.  No bruits.  Lungs: clear to auscultation. No rales no wheezes.  Heart: Regular rate and rhythm. Normal S1, S2. No S3.   No significant murmurs. PMI not displaced.  Abdomen:  Supple, nontender. Normal bowel sounds. No masses. No hepatomegaly.  Extremities:   Good distal pulses throughout. No lower extremity edema. R foot in sock.   Musculoskeletal :moving all extremities.  Neuro:   alert and oriented x3.  CN II-XII grossly intact.  EKG ST 110 bpm  Incomplete RBBB.  Sl ST depression V1 - V4, I, II, AVF.    Assessment and Plan:  1.  PReop evaluation  Patient being evaluated for orthopedic surgery on foot.  I am not convinced of any anginal symptoms.  She is tachycardic on exam.  I would check TSH  CBC was OK  I would also set up for echo to evaluate LV    2.  HTN  Follow

## 2013-04-26 ENCOUNTER — Ambulatory Visit (HOSPITAL_COMMUNITY)
Admission: RE | Admit: 2013-04-26 | Discharge: 2013-04-26 | Disposition: A | Discharge status: Home / Self Care | Payer: BC Managed Care – PPO | Source: Ambulatory Visit | Attending: Orthopedic Surgery | Admitting: Orthopedic Surgery

## 2013-04-26 DIAGNOSIS — Z01818 Encounter for other preprocedural examination: Secondary | ICD-10-CM

## 2013-04-26 DIAGNOSIS — I10 Essential (primary) hypertension: Secondary | ICD-10-CM | POA: Insufficient documentation

## 2013-04-26 DIAGNOSIS — Z01810 Encounter for preprocedural cardiovascular examination: Secondary | ICD-10-CM | POA: Insufficient documentation

## 2013-04-26 NOTE — Progress Notes (Signed)
Echo Lab  2D Echocardiogram completed.  Katharina Caper Ranbir Chew, RDCS 04/26/2013 3:34 PM

## 2013-04-27 ENCOUNTER — Telehealth: Payer: Self-pay | Admitting: Internal Medicine

## 2013-04-27 NOTE — Telephone Encounter (Signed)
New message     Want echo results from yesterday.  She had it at the hospital.

## 2013-04-28 NOTE — Telephone Encounter (Signed)
New message    Need echo results so that she can get her surgery scheduled.

## 2013-04-28 NOTE — Telephone Encounter (Signed)
Follow Up:  Pt calling for echo test results.

## 2013-04-28 NOTE — Telephone Encounter (Signed)
Spoke with pt, aware of echo results. Sent to dr handy for clearance

## 2013-05-01 ENCOUNTER — Telehealth: Payer: Self-pay | Admitting: Internal Medicine

## 2013-05-01 NOTE — Telephone Encounter (Signed)
Spoke with pt, will refax echo results containing the clearance to dr handy

## 2013-05-01 NOTE — Telephone Encounter (Signed)
New message     Dr Magdalene Patricia office is waiting on [place] to fax clearance for surgery.  Pls fax clearance to [phone] attn Dr Myrene Galas.

## 2013-05-10 ENCOUNTER — Encounter (HOSPITAL_COMMUNITY): Payer: Self-pay | Admitting: Pharmacist

## 2013-05-12 ENCOUNTER — Ambulatory Visit (HOSPITAL_COMMUNITY)
Admission: RE | Admit: 2013-05-12 | Discharge: 2013-05-12 | Disposition: A | Discharge status: Home / Self Care | Payer: BC Managed Care – PPO | Source: Ambulatory Visit | Attending: Anesthesiology | Admitting: Anesthesiology

## 2013-05-12 ENCOUNTER — Encounter (HOSPITAL_COMMUNITY)
Admission: RE | Admit: 2013-05-12 | Discharge: 2013-05-12 | Disposition: A | Discharge status: Home / Self Care | Payer: BC Managed Care – PPO | Source: Ambulatory Visit | Attending: Orthopedic Surgery | Admitting: Orthopedic Surgery

## 2013-05-12 ENCOUNTER — Telehealth: Payer: Self-pay | Admitting: Family Medicine

## 2013-05-12 ENCOUNTER — Encounter (HOSPITAL_COMMUNITY): Payer: Self-pay

## 2013-05-12 DIAGNOSIS — Z01812 Encounter for preprocedural laboratory examination: Secondary | ICD-10-CM | POA: Insufficient documentation

## 2013-05-12 DIAGNOSIS — I10 Essential (primary) hypertension: Secondary | ICD-10-CM | POA: Insufficient documentation

## 2013-05-12 DIAGNOSIS — Z01818 Encounter for other preprocedural examination: Secondary | ICD-10-CM | POA: Insufficient documentation

## 2013-05-12 DIAGNOSIS — G4730 Sleep apnea, unspecified: Secondary | ICD-10-CM | POA: Insufficient documentation

## 2013-05-12 LAB — BASIC METABOLIC PANEL
BUN: 10 mg/dL (ref 6–23)
CO2: 27 mEq/L (ref 19–32)
Calcium: 9.8 mg/dL (ref 8.4–10.5)
Chloride: 99 mEq/L (ref 96–112)
Creatinine, Ser: 0.77 mg/dL (ref 0.50–1.10)
GFR calc Af Amer: >90 mL/min (ref >90)

## 2013-05-12 LAB — CBC
HCT: 36.8 % (ref 36.0–46.0)
Hemoglobin: 12.3 g/dL (ref 12.0–15.0)
MCHC: 33.4 g/dL (ref 30.0–36.0)
MCV: 92.2 fL (ref 78.0–100.0)
RDW: 13.7 % (ref 11.5–15.5)

## 2013-05-12 NOTE — Telephone Encounter (Signed)
Spoke with patients daughter.  Advised that pt has not been seen since July and our policy is that no controlled substances w/o appt.  Daughter is understanding, but states that pt is having surgery (shattered heel) on Tuesday and they suggested her taking a xanex before her appt. Wants to know if one can be given and then they will make an appt after her surgery.  Will forward to MD. Milas Gain, Maryjo Rochester

## 2013-05-12 NOTE — Telephone Encounter (Signed)
Pt called and needs a refill of her Xanax. Sherry [name]

## 2013-05-12 NOTE — Pre-Procedure Instructions (Signed)
KALYNNE WOMAC  05/12/2013   Your procedure is scheduled on:  Dec. 9th at 1100  Report to [facility] Short Stay [facility]  2 * 3 at 0800 AM.  Call this number if you have problems the morning of surgery: [phone]   Remember:   Do not eat food or drink liquids after midnight.   Take these medicines the morning of surgery with A SIP OF WATER: Xanax if needed, Nexium, Oxycodone if needed, percocet if needed  Stop taking Aspirin, Aleve, Ibuprofen, Herbal medications, Fish Oil, BC's, Goody's   Do not wear jewelry, make-up or nail polish.  Do not wear lotions, powders, or perfumes. You may wear deodorant.  Do not shave 48 hours prior to surgery. Men may shave face and neck.  Do not bring valuables to the hospital.  [facility] is not responsible                  for any belongings or valuables.               Contacts, dentures or bridgework may not be worn into surgery.  Leave suitcase in the car. After surgery it may be brought to your room.  For patients admitted to the hospital, discharge time is determined by your                treatment team.               Patients discharged the day of surgery will not be allowed to drive  home.    Special Instructions: Shower using CHG 2 nights before surgery and the night before surgery.  If you shower the day of surgery use CHG.  Use special wash - you have one bottle of CHG for all showers.  You should use approximately 1/3 of the bottle for each shower.   Please read over the following fact sheets that you were given: Pain Booklet, Coughing and Deep Breathing and Surgical Site Infection Prevention

## 2013-05-12 NOTE — Progress Notes (Signed)
PCP is Dr Rodman Pickle Denies seeing a Cardiologist. Denies having a stress test or card cath Echo noted in epic  From 04-26-13

## 2013-05-15 ENCOUNTER — Encounter (HOSPITAL_COMMUNITY): Payer: Self-pay

## 2013-05-15 NOTE — Progress Notes (Signed)
Anesthesia follow-up:  See my note from 04/19/13.  Surgery has since been rescheduled for 05/16/13 following cardiology evaluation by Dr. Dietrich Pates on 04/21/13.  Patent had an echo on 04/26/13 that showed: Left ventricle: The cavity size was normal. Wall thickness was normal. Systolic function was normal. The estimated ejection fraction was in the range of 60% to 65%. Wall motion was normal; there were no regional wall motion abnormalities. Dr. Tenny Craw ultimately cleared her with overall low cardiac risk.  EKG on 05/12/13 noted.  Rate is slower and T wave abnormality is more prominent in V4-5 when compared to EKG from 04/19/13.  Labs and CXR on 05/12/13 noted.  Patient with known abnormal EKG which prompted her surgery to be canceled last month.  She has since been cleared by Dr. Tenny Craw.  If no acute changes then I would anticipate that she could proceed as planned.  Sherry [name] [facility] Stay Center/Anesthesiology Phone [phone] 05/15/2013 10:13 AM

## 2013-05-15 NOTE — Telephone Encounter (Signed)
LMOVM for pt to return call.  10 Xanex have been called into [facility] on [address].  She will need an appt after surgery for additional refills. Dontrel Smethers, Maryjo Rochester

## 2013-05-15 NOTE — H&P (Signed)
[facility] H&P    Chief Complaint:  Subacute L calcaneus fracture  HPI:   52 y/o black female with multiple medical comorbidities s/p fall at home on 04/02/2013. Seen in ED on 04/03/2013 and found to have a complex L calcaneus fracture.  Pt seen at our office within several days of ED eval.  Initial eval of her L foot and ankle was notable for significant swelling to her foot and ankle which prevented early ORIF.  Pt was seen at or office about a week later and her soft tissue swelling had resolved enough to proceed with surgery.  On pre-op eval there were some EKG abnormalities which warranted further workup so surgery was cancelled.  Pt ultimately was seen by Dr. Tenny Craw with cardiology and had an echo preformed.  She was cleared for surgery with low cardiac risk. Pt now presents about 6 weeks post injury for ORIF of depressed intra-articular L calcaneus fracture.  Past Medical History  Diagnosis Date  . Anxiety   . Chronic pain   . Acid reflux disease   . Sleep apnea   . Fibromyalgia   . Hypertension   . Trichomonas contact, treated   . Depression   . Migraine     last one was 04/18/13  . History of kidney stones     passed  . 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 History of blood transfusion 1981    childbirth  . Kidney stone   . Anginal pain     last time 04/18/13 in afternoon; referred to Dr. Dietrich Pates    Past Surgical History  Procedure Laterality Date  . Cholecystectomy      2008  . Total abdominal hysterectomy      1986  . Wisdom tooth extraction      Family History  Problem Relation Age of Onset  . Diabetes Mother   . Hypertension Mother   . Depression Mother   . Diabetes Sister   . Hypertension Father   . Cancer Father     Prostate  . Alcohol abuse Father   . Cancer Brother     Prostate   Social History:  reports that she has never smoked. She has never used smokeless tobacco. She reports that she does not drink alcohol or use illicit  drugs.  Allergies: No Known Allergies  No prescriptions prior to admission    No results found for this or any previous visit (from the past 48 hour(s)). No results found.  Review of Systems  Constitutional: Negative for fever and chills.  Respiratory: Negative for shortness of breath and wheezing.   Cardiovascular: Negative for chest pain and palpitations.  Gastrointestinal: Negative for nausea and vomiting.  Genitourinary: Negative for dysuria and urgency.  Musculoskeletal:       L foot pain  Chronic pain   Neurological: Negative for tingling, sensory change and headaches.    There were no vitals taken for this visit. Physical Exam  Constitutional:  Older appearing black female NAD  HENT:  Head: Normocephalic and atraumatic.  Eyes: EOM are normal. Pupils are equal, round, and reactive to light.  Neck: Normal range of motion.  Cardiovascular:  s1 and s2  Respiratory:  Clear anteriorly   GI:  Soft, NT, +BS  Musculoskeletal:  L foot/ankle  Distal motor and sensory functions intact Ext warm Swelling resolved Lateral skin wrinkles along L foot + DP pulse No DCT Compartments soft and NT  Skin: Skin is warm and  intact.  Psychiatric: She has a normal mood and affect. Her speech is normal and behavior is normal. Thought content normal. Cognition and memory are normal.     CT L foot/ankle  Comminuted L intra-articular calcaneus fracture  Assessment/Plan  52 y/o female s/p fall with subacute L calcaneus fracture  OR for ORIF of L calcaneus Will eval intra-op to determine degree of healing present.  May opt to see how pt does w/o surgery if completely healed given the amount of time between injury and surgery given numerous delays. If so pt may need subtalar fusion at later date If ORIF performed pt will be NWB x 8 weeks Admit for pain control and therapies, will manage pain until acute issues with calcaneus resolved then will turn care back to Dr. Lynnette Caffey, PA-C [facility] [phone] (P)  05/15/2013, 10:21 PM

## 2013-05-15 NOTE — Telephone Encounter (Signed)
You can call in Xanax 1mg  #10 with 0 refills to take one tab po prn anxiety until she has an appointment. This is an exception to our policy, and I will not be able to send refills again.  Kimberlee Shoun M. Thierno Hun, M.D.

## 2013-05-16 ENCOUNTER — Inpatient Hospital Stay (HOSPITAL_COMMUNITY)
Admission: RE | Admit: 2013-05-16 | Discharge: 2013-05-19 | DRG: 516 | Disposition: A | Discharge status: Home Health | Payer: BC Managed Care – PPO | Source: Ambulatory Visit | Attending: Orthopedic Surgery | Admitting: Orthopedic Surgery

## 2013-05-16 ENCOUNTER — Inpatient Hospital Stay (HOSPITAL_COMMUNITY): Payer: BC Managed Care – PPO

## 2013-05-16 ENCOUNTER — Encounter (HOSPITAL_COMMUNITY)
Admission: RE | Disposition: A | Discharge status: Home Health | Payer: Self-pay | Source: Ambulatory Visit | Attending: Orthopedic Surgery

## 2013-05-16 ENCOUNTER — Encounter (HOSPITAL_COMMUNITY): Payer: Self-pay | Admitting: Vascular Surgery

## 2013-05-16 ENCOUNTER — Inpatient Hospital Stay (HOSPITAL_COMMUNITY): Payer: Self-pay

## 2013-05-16 ENCOUNTER — Inpatient Hospital Stay (HOSPITAL_COMMUNITY): Payer: BC Managed Care – PPO | Admitting: Anesthesiology

## 2013-05-16 DIAGNOSIS — Z8042 Family history of malignant neoplasm of prostate: Secondary | ICD-10-CM

## 2013-05-16 DIAGNOSIS — F329 Major depressive disorder, single episode, unspecified: Secondary | ICD-10-CM | POA: Diagnosis present

## 2013-05-16 DIAGNOSIS — S92002A Unspecified fracture of left calcaneus, initial encounter for closed fracture: Secondary | ICD-10-CM

## 2013-05-16 DIAGNOSIS — W19XXXA Unspecified fall, initial encounter: Secondary | ICD-10-CM | POA: Diagnosis present

## 2013-05-16 DIAGNOSIS — G8918 Other acute postprocedural pain: Secondary | ICD-10-CM | POA: Diagnosis not present

## 2013-05-16 DIAGNOSIS — Z87442 Personal history of urinary calculi: Secondary | ICD-10-CM

## 2013-05-16 DIAGNOSIS — I10 Essential (primary) hypertension: Secondary | ICD-10-CM | POA: Diagnosis present

## 2013-05-16 DIAGNOSIS — F3289 Other specified depressive episodes: Secondary | ICD-10-CM | POA: Diagnosis present

## 2013-05-16 DIAGNOSIS — Z833 Family history of diabetes mellitus: Secondary | ICD-10-CM

## 2013-05-16 DIAGNOSIS — F411 Generalized anxiety disorder: Secondary | ICD-10-CM | POA: Diagnosis present

## 2013-05-16 DIAGNOSIS — S92009A Unspecified fracture of unspecified calcaneus, initial encounter for closed fracture: Principal | ICD-10-CM

## 2013-05-16 DIAGNOSIS — G4730 Sleep apnea, unspecified: Secondary | ICD-10-CM | POA: Diagnosis present

## 2013-05-16 DIAGNOSIS — M797 Fibromyalgia: Secondary | ICD-10-CM | POA: Diagnosis present

## 2013-05-16 DIAGNOSIS — Z9089 Acquired absence of other organs: Secondary | ICD-10-CM

## 2013-05-16 DIAGNOSIS — Z8249 Family history of ischemic heart disease and other diseases of the circulatory system: Secondary | ICD-10-CM

## 2013-05-16 DIAGNOSIS — Z6379 Other stressful life events affecting family and household: Secondary | ICD-10-CM

## 2013-05-16 DIAGNOSIS — E669 Obesity, unspecified: Secondary | ICD-10-CM | POA: Diagnosis present

## 2013-05-16 DIAGNOSIS — F419 Anxiety disorder, unspecified: Secondary | ICD-10-CM | POA: Diagnosis present

## 2013-05-16 DIAGNOSIS — Z7982 Long term (current) use of aspirin: Secondary | ICD-10-CM

## 2013-05-16 DIAGNOSIS — G8929 Other chronic pain: Secondary | ICD-10-CM | POA: Diagnosis present

## 2013-05-16 DIAGNOSIS — D62 Acute posthemorrhagic anemia: Secondary | ICD-10-CM | POA: Diagnosis not present

## 2013-05-16 DIAGNOSIS — K219 Gastro-esophageal reflux disease without esophagitis: Secondary | ICD-10-CM | POA: Diagnosis present

## 2013-05-16 DIAGNOSIS — Z818 Family history of other mental and behavioral disorders: Secondary | ICD-10-CM

## 2013-05-16 DIAGNOSIS — Z79899 Other long term (current) drug therapy: Secondary | ICD-10-CM

## 2013-05-16 HISTORY — PX: ORIF CALCANEOUS FRACTURE: SHX5030

## 2013-05-16 LAB — CREATININE, SERUM: GFR calc non Af Amer: >90 mL/min (ref >90)

## 2013-05-16 LAB — CBC
HCT: 36.1 % (ref 36.0–46.0)
Hemoglobin: 11.9 g/dL — ABNORMAL LOW (ref 12.0–15.0)
MCH: 30.5 pg (ref 26.0–34.0)
MCHC: 33 g/dL (ref 30.0–36.0)
RBC: 3.9 MIL/uL (ref 3.87–5.11)
RDW: 13.8 % (ref 11.5–15.5)
WBC: 10 10*3/uL (ref 4.0–10.5)

## 2013-05-16 SURGERY — OPEN REDUCTION INTERNAL FIXATION (ORIF) CALCANEOUS FRACTURE
Anesthesia: General | Site: Foot | Laterality: Left

## 2013-05-16 MED ORDER — ASPIRIN 81 MG PO CHEW
81.0000 mg | CHEWABLE_TABLET | Freq: Every morning | ORAL | Status: DC
  Administered 2013-05-17 – 2013-05-19 (×3): 81 mg via ORAL
  Filled 2013-05-16 (×3): qty 1

## 2013-05-16 MED ORDER — HYDROMORPHONE HCL PF 1 MG/ML IJ SOLN
INTRAMUSCULAR | Status: AC
  Filled 2013-05-16: qty 1

## 2013-05-16 MED ORDER — ONDANSETRON HCL 4 MG/2ML IJ SOLN: 4.0000 mg | Freq: Four times a day (QID) | INTRAMUSCULAR | Status: DC | PRN

## 2013-05-16 MED ORDER — DIPHENHYDRAMINE HCL 12.5 MG/5ML PO ELIX: 12.5000 mg | ORAL_SOLUTION | ORAL | Status: DC | PRN

## 2013-05-16 MED ORDER — DIPHENHYDRAMINE HCL 50 MG/ML IJ SOLN: 12.5000 mg | Freq: Four times a day (QID) | INTRAMUSCULAR | Status: DC | PRN

## 2013-05-16 MED ORDER — HYDROMORPHONE 0.3 MG/ML IV SOLN
INTRAVENOUS | Status: AC
  Filled 2013-05-16: qty 25

## 2013-05-16 MED ORDER — ACETAMINOPHEN 10 MG/ML IV SOLN
1000.0000 mg | INTRAVENOUS | Status: AC
  Administered 2013-05-16: 1000 mg via INTRAVENOUS
  Filled 2013-05-16: qty 100

## 2013-05-16 MED ORDER — NALOXONE HCL 0.4 MG/ML IJ SOLN: 0.4000 mg | INTRAMUSCULAR | Status: DC | PRN

## 2013-05-16 MED ORDER — DIPHENHYDRAMINE HCL 12.5 MG/5ML PO ELIX: 12.5000 mg | ORAL_SOLUTION | Freq: Four times a day (QID) | ORAL | Status: DC | PRN

## 2013-05-16 MED ORDER — METOCLOPRAMIDE HCL 5 MG/ML IJ SOLN: 5.0000 mg | Freq: Three times a day (TID) | INTRAMUSCULAR | Status: DC | PRN

## 2013-05-16 MED ORDER — OXYCODONE-ACETAMINOPHEN 5-325 MG PO TABS: 1.0000 | ORAL_TABLET | ORAL | Status: DC | PRN

## 2013-05-16 MED ORDER — GLYCOPYRROLATE 0.2 MG/ML IJ SOLN
INTRAMUSCULAR | Status: DC | PRN
  Administered 2013-05-16: .4 mg via INTRAVENOUS

## 2013-05-16 MED ORDER — BISACODYL 10 MG RE SUPP: 10.0000 mg | Freq: Every day | RECTAL | Status: DC | PRN

## 2013-05-16 MED ORDER — HYDROMORPHONE HCL PF 1 MG/ML IJ SOLN
0.2500 mg | INTRAMUSCULAR | Status: DC | PRN
  Administered 2013-05-16 (×2): 0.5 mg via INTRAVENOUS

## 2013-05-16 MED ORDER — HYDROMORPHONE 0.3 MG/ML IV SOLN
INTRAVENOUS | Status: DC
  Administered 2013-05-17: 0.2 mg via INTRAVENOUS
  Administered 2013-05-17: 3.1 mg via INTRAVENOUS
  Administered 2013-05-17: 0.59 mg via INTRAVENOUS
  Administered 2013-05-17: 01:00:00 via INTRAVENOUS
  Filled 2013-05-16: qty 25

## 2013-05-16 MED ORDER — PROMETHAZINE HCL 25 MG/ML IJ SOLN: 6.2500 mg | INTRAMUSCULAR | Status: DC | PRN

## 2013-05-16 MED ORDER — CHLORHEXIDINE GLUCONATE 4 % EX LIQD: 60.0000 mL | Freq: Once | CUTANEOUS | Status: DC

## 2013-05-16 MED ORDER — CYCLOBENZAPRINE HCL 10 MG PO TABS
10.0000 mg | ORAL_TABLET | Freq: Three times a day (TID) | ORAL | Status: DC | PRN
  Administered 2013-05-17: 10 mg via ORAL
  Filled 2013-05-16: qty 1

## 2013-05-16 MED ORDER — LABETALOL HCL 5 MG/ML IV SOLN
INTRAVENOUS | Status: DC | PRN
  Administered 2013-05-16: 10 mg via INTRAVENOUS

## 2013-05-16 MED ORDER — MAGNESIUM CITRATE PO SOLN: 1.0000 | Freq: Once | ORAL | Status: AC | PRN

## 2013-05-16 MED ORDER — OXYCODONE HCL 5 MG PO TABS
10.0000 mg | ORAL_TABLET | ORAL | Status: DC | PRN
  Administered 2013-05-16 – 2013-05-17 (×3): 10 mg via ORAL
  Filled 2013-05-16 (×2): qty 2

## 2013-05-16 MED ORDER — 0.9 % SODIUM CHLORIDE (POUR BTL) OPTIME
TOPICAL | Status: DC | PRN
  Administered 2013-05-16: 1000 mL

## 2013-05-16 MED ORDER — MIDAZOLAM HCL 5 MG/5ML IJ SOLN
INTRAMUSCULAR | Status: DC | PRN
  Administered 2013-05-16: 2 mg via INTRAVENOUS

## 2013-05-16 MED ORDER — OXYCODONE HCL 5 MG PO TABS
5.0000 mg | ORAL_TABLET | Freq: Once | ORAL | Status: AC | PRN
  Administered 2013-05-16: 5 mg via ORAL

## 2013-05-16 MED ORDER — NEOSTIGMINE METHYLSULFATE 1 MG/ML IJ SOLN
INTRAMUSCULAR | Status: DC | PRN
  Administered 2013-05-16: 3 mg via INTRAVENOUS

## 2013-05-16 MED ORDER — ALPRAZOLAM 0.5 MG PO TABS
1.0000 mg | ORAL_TABLET | Freq: Two times a day (BID) | ORAL | Status: DC | PRN
  Administered 2013-05-17: 1 mg via ORAL
  Filled 2013-05-16: qty 2

## 2013-05-16 MED ORDER — SIMVASTATIN 10 MG PO TABS
10.0000 mg | ORAL_TABLET | Freq: Every day | ORAL | Status: DC
  Administered 2013-05-16 – 2013-05-18 (×3): 10 mg via ORAL
  Filled 2013-05-16 (×4): qty 1

## 2013-05-16 MED ORDER — ROCURONIUM BROMIDE 100 MG/10ML IV SOLN
INTRAVENOUS | Status: DC | PRN
  Administered 2013-05-16: 50 mg via INTRAVENOUS

## 2013-05-16 MED ORDER — HYDROMORPHONE 0.3 MG/ML IV SOLN
INTRAVENOUS | Status: DC
  Administered 2013-05-16: 2.1 mg via INTRAVENOUS
  Administered 2013-05-16: 0.3 mg via INTRAVENOUS

## 2013-05-16 MED ORDER — FENTANYL CITRATE 0.05 MG/ML IJ SOLN
INTRAMUSCULAR | Status: DC | PRN
  Administered 2013-05-16: 150 ug via INTRAVENOUS
  Administered 2013-05-16: 100 ug via INTRAVENOUS
  Administered 2013-05-16: 150 ug via INTRAVENOUS
  Administered 2013-05-16: 100 ug via INTRAVENOUS

## 2013-05-16 MED ORDER — PROPOFOL 10 MG/ML IV BOLUS
INTRAVENOUS | Status: DC | PRN
  Administered 2013-05-16: 200 mg via INTRAVENOUS

## 2013-05-16 MED ORDER — CEFAZOLIN SODIUM 1-5 GM-% IV SOLN
1.0000 g | Freq: Four times a day (QID) | INTRAVENOUS | Status: AC
  Administered 2013-05-16 – 2013-05-17 (×3): 1 g via INTRAVENOUS
  Filled 2013-05-16 (×6): qty 50

## 2013-05-16 MED ORDER — ONDANSETRON HCL 4 MG PO TABS: 4.0000 mg | ORAL_TABLET | Freq: Four times a day (QID) | ORAL | Status: DC | PRN

## 2013-05-16 MED ORDER — OXYCODONE HCL 5 MG PO TABS
ORAL_TABLET | ORAL | Status: AC
  Filled 2013-05-16: qty 1

## 2013-05-16 MED ORDER — CEFAZOLIN SODIUM-DEXTROSE 2-3 GM-% IV SOLR
INTRAVENOUS | Status: AC
  Administered 2013-05-16: 2 g via INTRAVENOUS
  Filled 2013-05-16: qty 50

## 2013-05-16 MED ORDER — GABAPENTIN 100 MG PO CAPS
100.0000 mg | ORAL_CAPSULE | Freq: Every day | ORAL | Status: DC
  Administered 2013-05-16 – 2013-05-18 (×3): 100 mg via ORAL
  Filled 2013-05-16 (×4): qty 1

## 2013-05-16 MED ORDER — METOCLOPRAMIDE HCL 10 MG PO TABS: 5.0000 mg | ORAL_TABLET | Freq: Three times a day (TID) | ORAL | Status: DC | PRN

## 2013-05-16 MED ORDER — POTASSIUM CHLORIDE IN NACL 20-0.9 MEQ/L-% IV SOLN
INTRAVENOUS | Status: DC
  Administered 2013-05-16 – 2013-05-17 (×2): via INTRAVENOUS
  Filled 2013-05-16 (×3): qty 1000

## 2013-05-16 MED ORDER — MAGNESIUM HYDROXIDE 400 MG/5ML PO SUSP: 30.0000 mL | Freq: Every day | ORAL | Status: DC | PRN

## 2013-05-16 MED ORDER — SODIUM CHLORIDE 0.9 % IJ SOLN: 9.0000 mL | INTRAMUSCULAR | Status: DC | PRN

## 2013-05-16 MED ORDER — OXYCODONE HCL 5 MG PO TABS
5.0000 mg | ORAL_TABLET | ORAL | Status: DC | PRN
  Filled 2013-05-16 (×2): qty 2

## 2013-05-16 MED ORDER — OXYCODONE HCL 5 MG/5ML PO SOLN: 5.0000 mg | Freq: Once | ORAL | Status: AC | PRN

## 2013-05-16 MED ORDER — ENOXAPARIN SODIUM 40 MG/0.4ML ~~LOC~~ SOLN
40.0000 mg | SUBCUTANEOUS | Status: DC
  Administered 2013-05-16 – 2013-05-18 (×3): 40 mg via SUBCUTANEOUS
  Filled 2013-05-16 (×4): qty 0.4

## 2013-05-16 MED ORDER — LIDOCAINE HCL (CARDIAC) 20 MG/ML IV SOLN
INTRAVENOUS | Status: DC | PRN
  Administered 2013-05-16: 100 mg via INTRAVENOUS

## 2013-05-16 MED ORDER — ONDANSETRON HCL 4 MG/2ML IJ SOLN
INTRAMUSCULAR | Status: DC | PRN
  Administered 2013-05-16: 4 mg via INTRAVENOUS

## 2013-05-16 MED ORDER — LACTATED RINGERS IV SOLN
INTRAVENOUS | Status: DC
  Administered 2013-05-16 (×2): via INTRAVENOUS

## 2013-05-16 MED ORDER — DOCUSATE SODIUM 100 MG PO CAPS
100.0000 mg | ORAL_CAPSULE | Freq: Two times a day (BID) | ORAL | Status: DC
  Administered 2013-05-16 – 2013-05-19 (×6): 100 mg via ORAL
  Filled 2013-05-16 (×8): qty 1

## 2013-05-16 MED ORDER — ACETAMINOPHEN 500 MG PO TABS: 1000.0000 mg | ORAL_TABLET | Freq: Once | ORAL | Status: DC

## 2013-05-16 MED ORDER — PANTOPRAZOLE SODIUM 40 MG PO TBEC
40.0000 mg | DELAYED_RELEASE_TABLET | Freq: Every day | ORAL | Status: DC
  Administered 2013-05-17 – 2013-05-19 (×3): 40 mg via ORAL
  Filled 2013-05-16 (×3): qty 1

## 2013-05-16 MED ORDER — LACTATED RINGERS IV SOLN: INTRAVENOUS | Status: DC

## 2013-05-16 SURGICAL SUPPLY — 77 items
BANDAGE ELASTIC 4 VELCRO ST LF (GAUZE/BANDAGES/DRESSINGS) ×2 IMPLANT
BANDAGE ELASTIC 6 VELCRO ST LF (GAUZE/BANDAGES/DRESSINGS) ×2 IMPLANT
BANDAGE ESMARK 6X9 LF (GAUZE/BANDAGES/DRESSINGS) ×1 IMPLANT
BANDAGE GAUZE ELAST BULKY 4 IN (GAUZE/BANDAGES/DRESSINGS) ×4 IMPLANT
BIT DRILL 2.5X2.75 QC CALB (BIT) ×1 IMPLANT
BLADE SURG 10 STRL SS (BLADE) ×2 IMPLANT
BNDG CMPR 9X6 STRL LF SNTH (GAUZE/BANDAGES/DRESSINGS) ×1
BNDG COHESIVE 4X5 TAN STRL (GAUZE/BANDAGES/DRESSINGS) ×2 IMPLANT
BNDG ESMARK 6X9 LF (GAUZE/BANDAGES/DRESSINGS) ×2
BNDG GAUZE ELAST 4 BULKY (GAUZE/BANDAGES/DRESSINGS) ×2 IMPLANT
BONE CANC CHIPS 40CC CAN1/2 (Bone Implant) ×2 IMPLANT
BRUSH SCRUB DISP (MISCELLANEOUS) ×4 IMPLANT
CHIPS CANC BONE 40CC CAN1/2 (Bone Implant) ×1 IMPLANT
CLOTH BEACON ORANGE TIMEOUT ST (SAFETY) ×2 IMPLANT
COVER MAYO STAND STRL (DRAPES) ×2 IMPLANT
COVER SURGICAL LIGHT HANDLE (MISCELLANEOUS) ×3 IMPLANT
CUFF TOURNIQUET SINGLE 34IN LL (TOURNIQUET CUFF) ×1 IMPLANT
DRAIN TLS ROUND 10FR (DRAIN) ×2 IMPLANT
DRAPE C-ARM 42X72 X-RAY (DRAPES) ×2 IMPLANT
DRAPE C-ARMOR (DRAPES) ×2 IMPLANT
DRAPE ORTHO SPLIT 77X108 STRL (DRAPES) ×2
DRAPE SURG ORHT 6 SPLT 77X108 (DRAPES) ×1 IMPLANT
DRAPE U-SHAPE 47X51 STRL (DRAPES) ×2 IMPLANT
DRSG EMULSION OIL 3X3 NADH (GAUZE/BANDAGES/DRESSINGS) ×2 IMPLANT
ELECT REM PT RETURN 9FT ADLT (ELECTROSURGICAL) ×2
ELECTRODE REM PT RTRN 9FT ADLT (ELECTROSURGICAL) ×1 IMPLANT
GLOVE BIO SURGEON STRL SZ7.5 (GLOVE) ×2 IMPLANT
GLOVE BIO SURGEON STRL SZ8 (GLOVE) ×2 IMPLANT
GLOVE BIOGEL PI IND STRL 7.5 (GLOVE) ×1 IMPLANT
GLOVE BIOGEL PI INDICATOR 7.5 (GLOVE) ×1
GOWN PREVENTION PLUS XLARGE (GOWN DISPOSABLE) ×2 IMPLANT
GOWN STRL NON-REIN LRG LVL3 (GOWN DISPOSABLE) ×4 IMPLANT
GRAFT BNE CHIP CANC 1-8 40 (Bone Implant) IMPLANT
K-WIRE ACE 1.6X6 (WIRE) ×8
KIT BASIN OR (CUSTOM PROCEDURE TRAY) ×2 IMPLANT
KIT ROOM TURNOVER OR (KITS) ×2 IMPLANT
KWIRE ACE 1.6X6 (WIRE) IMPLANT
NDL HYPO 21X1.5 SAFETY (NEEDLE) IMPLANT
NEEDLE HYPO 21X1.5 SAFETY (NEEDLE) IMPLANT
NS IRRIG 1000ML POUR BTL (IV SOLUTION) ×2 IMPLANT
PACK ORTHO EXTREMITY (CUSTOM PROCEDURE TRAY) ×2 IMPLANT
PAD ARMBOARD 7.5X6 YLW CONV (MISCELLANEOUS) ×4 IMPLANT
PAD CAST 4YDX4 CTTN HI CHSV (CAST SUPPLIES) ×2 IMPLANT
PADDING CAST COTTON 4X4 STRL (CAST SUPPLIES) ×2
PADDING CAST COTTON 6X4 STRL (CAST SUPPLIES) ×2 IMPLANT
PENCIL BUTTON HOLSTER BLD 10FT (ELECTRODE) ×2 IMPLANT
PLATE ACE PERIMETER (Plate) ×2 IMPLANT
SCREW CORTICAL 3.5MM  28MM (Screw) ×2 IMPLANT
SCREW CORTICAL 3.5MM  30MM (Screw) ×1 IMPLANT
SCREW CORTICAL 3.5MM  42MM (Screw) ×1 IMPLANT
SCREW CORTICAL 3.5MM 22MM (Screw) ×1 IMPLANT
SCREW CORTICAL 3.5MM 24MM (Screw) ×1 IMPLANT
SCREW CORTICAL 3.5MM 26MM (Screw) ×1 IMPLANT
SCREW CORTICAL 3.5MM 28MM (Screw) IMPLANT
SCREW CORTICAL 3.5MM 30MM (Screw) ×1 IMPLANT
SCREW CORTICAL 3.5MM 36MM (Screw) ×1 IMPLANT
SCREW CORTICAL 3.5MM 38MM (Screw) ×1 IMPLANT
SCREW CORTICAL 3.5MM 42MM (Screw) IMPLANT
SPLINT PLASTER CAST XFAST 5X30 (CAST SUPPLIES) IMPLANT
SPLINT PLASTER XFAST SET 5X30 (CAST SUPPLIES) ×1
SPONGE GAUZE 4X4 12PLY (GAUZE/BANDAGES/DRESSINGS) ×2 IMPLANT
SPONGE LAP 18X18 X RAY DECT (DISPOSABLE) ×6 IMPLANT
SPONGE SCRUB IODOPHOR (GAUZE/BANDAGES/DRESSINGS) ×2 IMPLANT
SUCTION FRAZIER TIP 10 FR DISP (SUCTIONS) ×2 IMPLANT
SUT ETHILON 3 0 PS 1 (SUTURE) ×3 IMPLANT
SUT VIC AB 0 CT1 18XCR BRD 8 (SUTURE) IMPLANT
SUT VIC AB 0 CT1 8-18 (SUTURE) ×2
SUT VIC AB 2-0 CT2 18 VCP726D (SUTURE) ×4 IMPLANT
SUT VIC AB 2-0 CT3 27 (SUTURE) IMPLANT
SUT VIC AB 2-0 SH 18 (SUTURE) ×2 IMPLANT
SUT VIC AB 3-0 FS2 27 (SUTURE) ×2 IMPLANT
SYR CONTROL 10ML LL (SYRINGE) IMPLANT
TOWEL OR 17X24 6PK STRL BLUE (TOWEL DISPOSABLE) ×2 IMPLANT
TOWEL OR 17X26 10 PK STRL BLUE (TOWEL DISPOSABLE) ×4 IMPLANT
TUBE CONNECTING 12X1/4 (SUCTIONS) ×2 IMPLANT
UNDERPAD 30X30 INCONTINENT (UNDERPADS AND DIAPERS) ×2 IMPLANT
WATER STERILE IRR 1000ML POUR (IV SOLUTION) IMPLANT

## 2013-05-16 NOTE — Brief Op Note (Signed)
05/16/2013  2:12 PM  PATIENT:  Sherry [name]  52 y.o. female  PRE-OPERATIVE DIAGNOSIS:  LEFT CALCANEOUS FRACTURE   POST-OPERATIVE DIAGNOSIS:  LEFT CALCANEOUS FRACTURE   PROCEDURE:  Procedure(s): OPEN REDUCTION INTERNAL FIXATION (ORIF) LEFT CALCANEOUS FRACTURE (Left)  SURGEON:  Surgeon(s) and Role:    * Budd Palmer, MD - Primary  PHYSICIAN ASSISTANT: Montez Morita, PA-C  ANESTHESIA:   general  I/O:  Total I/O In: 1000 [I.V.:1000] Out: 425 [Urine:425]  SPECIMEN:  No Specimen  TOURNIQUET:   Total Tourniquet Time Documented: Thigh (Left) - 93 minutes Total: Thigh (Left) - 93 minutes   DICTATION: .Other Dictation: Dictation Number [id number]

## 2013-05-16 NOTE — Anesthesia Preprocedure Evaluation (Signed)
Anesthesia Evaluation  Patient identified by MRN, date of birth, ID band Patient awake    Reviewed: Allergy & Precautions, H&P , NPO status , Patient's Chart, lab work & pertinent test results  History of Anesthesia Complications Negative for: history of anesthetic complications  Airway Mallampati: II  Neck ROM: Full    Dental   Pulmonary sleep apnea ,  breath sounds clear to auscultation        Cardiovascular hypertension, - anginaRhythm:Regular Rate:Normal     Neuro/Psych  Headaches, Anxiety Depression  Neuromuscular disease    GI/Hepatic GERD-  ,  Endo/Other  Morbid obesity  Renal/GU      Musculoskeletal  (+) Fibromyalgia -  Abdominal (+) + obese,   Peds  Hematology   Anesthesia Other Findings   Reproductive/Obstetrics                           Anesthesia Physical Anesthesia Plan  ASA: II  Anesthesia Plan: General   Post-op Pain Management:    Induction: Intravenous  Airway Management Planned: Oral ETT  Additional Equipment:   Intra-op Plan:   Post-operative Plan: Extubation in OR  Informed Consent: I have reviewed the patients History and Physical, chart, labs and discussed the procedure including the risks, benefits and alternatives for the proposed anesthesia with the patient or authorized representative who has indicated his/her understanding and acceptance.     Plan Discussed with: CRNA and Surgeon  Anesthesia Plan Comments:         Anesthesia Quick Evaluation

## 2013-05-16 NOTE — Transfer of Care (Signed)
Immediate Anesthesia Transfer of Care Note  Patient: Sherry [name]  Procedure(s) Performed: Procedure(s): OPEN REDUCTION INTERNAL FIXATION (ORIF) LEFT CALCANEOUS FRACTURE (Left)  Patient Location: PACU  Anesthesia Type:General  Level of Consciousness: awake and sedated  Airway & Oxygen Therapy: Patient Spontanous Breathing and Patient connected to nasal cannula oxygen  Post-op Assessment: Report given to PACU RN and Post -op Vital signs reviewed and stable  Post vital signs: Reviewed and stable  Complications: No apparent anesthesia complications

## 2013-05-16 NOTE — Preoperative (Signed)
Beta Blockers   Reason not to administer Beta Blockers:Not Applicable 

## 2013-05-16 NOTE — Anesthesia Postprocedure Evaluation (Signed)
  Anesthesia Post-op Note  Patient: Sherry [name]  Procedure(s) Performed: Procedure(s): OPEN REDUCTION INTERNAL FIXATION (ORIF) LEFT CALCANEOUS FRACTURE (Left)  Patient Location: PACU  Anesthesia Type:General  Level of Consciousness: awake, alert , oriented and patient cooperative  Airway and Oxygen Therapy: Patient Spontanous Breathing  Post-op Pain: moderate  Post-op Assessment: Post-op Vital signs reviewed, Patient's Cardiovascular Status Stable, Respiratory Function Stable, Patent Airway, No signs of Nausea or vomiting and Pain level controlled  Post-op Vital Signs: stable  Complications: No apparent anesthesia complications

## 2013-05-16 NOTE — H&P (Signed)
I have seen and examined the patient. I agree with the findings above.   I discussed with the patient the risks and benefits of surgery, including the possibility of infection, which could lead to limb loss, nerve injury, vessel injury, wound breakdown, arthritis, symptomatic hardware, DVT/ PE, loss of motion, and need for further surgery among others.  She understood these risks and wished to proceed.  Myrene Galas, MD [facility] [phone] (p)

## 2013-05-16 NOTE — Anesthesia Procedure Notes (Signed)
Procedure Name: Intubation Date/Time: 05/16/2013 11:47 AM Performed by: Arlice Colt B Pre-anesthesia Checklist: Patient identified, Emergency Drugs available, Suction available, Patient being monitored and Timeout performed Patient Re-evaluated:Patient Re-evaluated prior to inductionOxygen Delivery Method: Circle system utilized Preoxygenation: Pre-oxygenation with 100% oxygen Intubation Type: IV induction Ventilation: Mask ventilation without difficulty Laryngoscope Size: Mac and 3 Grade View: Grade I Tube type: Oral Tube size: 7.0 mm Number of attempts: 1 Airway Equipment and Method: Stylet Placement Confirmation: ETT inserted through vocal cords under direct vision,  positive ETCO2 and breath sounds checked- equal and bilateral Secured at: 22 cm Tube secured with: Tape Dental Injury: Teeth and Oropharynx as per pre-operative assessment

## 2013-05-17 LAB — BASIC METABOLIC PANEL
BUN: 6 mg/dL (ref 6–23)
CO2: 27 mEq/L (ref 19–32)
Chloride: 99 mEq/L (ref 96–112)
Creatinine, Ser: 0.72 mg/dL (ref 0.50–1.10)
GFR calc Af Amer: >90 mL/min (ref >90)
GFR calc non Af Amer: >90 mL/min (ref >90)
Glucose, Bld: 140 mg/dL — ABNORMAL HIGH (ref 70–99)
Potassium: 4.3 mEq/L (ref 3.5–5.1)
Sodium: 134 mEq/L — ABNORMAL LOW (ref 135–145)

## 2013-05-17 LAB — CBC
Hemoglobin: 10.6 g/dL — ABNORMAL LOW (ref 12.0–15.0)
MCH: 30.2 pg (ref 26.0–34.0)
MCHC: 33 g/dL (ref 30.0–36.0)
MCV: 91.5 fL (ref 78.0–100.0)
RBC: 3.51 MIL/uL — ABNORMAL LOW (ref 3.87–5.11)
RDW: 14 % (ref 11.5–15.5)
WBC: 9.2 10*3/uL (ref 4.0–10.5)

## 2013-05-17 MED ORDER — OXYCODONE HCL 5 MG PO TABS
5.0000 mg | ORAL_TABLET | ORAL | Status: DC | PRN
  Administered 2013-05-17 – 2013-05-19 (×7): 10 mg via ORAL
  Filled 2013-05-17 (×3): qty 2
  Filled 2013-05-17 (×2): qty 1
  Filled 2013-05-17 (×3): qty 2
  Filled 2013-05-17 (×2): qty 1

## 2013-05-17 MED ORDER — LISINOPRIL 20 MG PO TABS
20.0000 mg | ORAL_TABLET | Freq: Every day | ORAL | Status: DC
  Administered 2013-05-17 – 2013-05-19 (×2): 20 mg via ORAL
  Filled 2013-05-17 (×3): qty 1

## 2013-05-17 MED ORDER — OXYCODONE-ACETAMINOPHEN 5-325 MG PO TABS
1.0000 | ORAL_TABLET | Freq: Four times a day (QID) | ORAL | Status: DC | PRN
  Administered 2013-05-17 (×3): 1 via ORAL
  Administered 2013-05-18 (×2): 2 via ORAL
  Administered 2013-05-18: 1 via ORAL
  Administered 2013-05-19 (×2): 2 via ORAL
  Filled 2013-05-17: qty 2
  Filled 2013-05-17 (×2): qty 1
  Filled 2013-05-17: qty 2
  Filled 2013-05-17: qty 1
  Filled 2013-05-17 (×2): qty 2
  Filled 2013-05-17: qty 1

## 2013-05-17 MED ORDER — HYDROMORPHONE HCL PF 1 MG/ML IJ SOLN
0.5000 mg | INTRAMUSCULAR | Status: DC | PRN
  Administered 2013-05-17: 1 mg via INTRAVENOUS

## 2013-05-17 MED ORDER — ACETAMINOPHEN 325 MG PO TABS: 325.0000 mg | ORAL_TABLET | Freq: Four times a day (QID) | ORAL | Status: DC | PRN

## 2013-05-17 MED ORDER — OXYCODONE HCL 5 MG PO TABS
5.0000 mg | ORAL_TABLET | Freq: Four times a day (QID) | ORAL | Status: DC | PRN
  Administered 2013-05-17 – 2013-05-19 (×8): 5 mg via ORAL
  Filled 2013-05-17: qty 2
  Filled 2013-05-17 (×5): qty 1

## 2013-05-17 MED ORDER — HYDROMORPHONE HCL PF 1 MG/ML IJ SOLN
INTRAMUSCULAR | Status: AC
  Filled 2013-05-17: qty 1

## 2013-05-17 MED ORDER — METHOCARBAMOL 500 MG PO TABS
500.0000 mg | ORAL_TABLET | Freq: Four times a day (QID) | ORAL | Status: DC | PRN
  Administered 2013-05-17 – 2013-05-19 (×5): 500 mg via ORAL
  Filled 2013-05-17 (×5): qty 1

## 2013-05-17 NOTE — Progress Notes (Signed)
I have reviewed and discussed in detail with Mr. Paul the patient's presentation, examination findings, and I formulated the plan outlined above.  Jerett Odonohue, MD [facility] [phone] (p)   

## 2013-05-17 NOTE — Progress Notes (Signed)
[facility] Progress Note  Subjective  C/o pain Left heel Little sleep last night No all that hungry  Using PCA No other complaints   Review of Systems  Constitutional: Negative for fever and chills.  Eyes: Negative for blurred vision.  Respiratory: Negative for shortness of breath and wheezing.   Cardiovascular: Negative for chest pain.  Gastrointestinal: Negative for nausea and vomiting.  Genitourinary:       + Foley   Neurological: Negative for tingling, sensory change and headaches.    Objective   BP 160/83  Pulse 127  Temp(Src) 99.5 F (37.5 C) (Oral)  Resp 18  SpO2 97%  Intake/Output     12/09 0701 - 12/10 0700 12/10 0701 - 12/11 0700   I.V. 2700    IV Piggyback 150    Total Intake 2850     Urine 1975    Drains 33    Total Output 2008     Net +842            Labs  Results for Sherry [name], Sherry [name] (MRN [id number]) as of 05/17/2013 08:07  Ref. Range 05/17/2013 04:55  Sodium Latest Range: 135-145 mEq/L 134 (L)  Potassium Latest Range: 3.5-5.1 mEq/L 4.3  Chloride Latest Range: 96-112 mEq/L 99  CO2 Latest Range: 19-32 mEq/L 27  BUN Latest Range: 6-23 mg/dL 6  Creatinine Latest Range: 0.50-1.10 mg/dL 7.82  Calcium Latest Range: 8.4-10.5 mg/dL 9.3  GFR calc non Af Amer Latest Range: >90 mL/min >90  GFR calc Af Amer Latest Range: >90 mL/min >90  Glucose Latest Range: 70-99 mg/dL 956 (H)  WBC Latest Range: 4.0-10.5 K/uL 9.2  RBC Latest Range: 3.87-5.11 MIL/uL 3.51 (L)  Hemoglobin Latest Range: 12.0-15.0 g/dL 21.3 (L)  HCT Latest Range: 36.0-46.0 % 32.1 (L)  MCV Latest Range: 78.0-100.0 fL 91.5  MCH Latest Range: 26.0-34.0 pg 30.2  MCHC Latest Range: 30.0-36.0 g/dL 08.6  RDW Latest Range: 11.5-15.5 % 14.0  Platelets Latest Range: 150-400 K/uL 434 (H)     Exam  Gen: somnolent but arousable, appears tired  Lungs: clear bilaterally  Cardiac: tachy but regular, s1 and s2 Abd: + BS, NTND Ext:       Left Lower Extremity  Splint  C/d/I  DPN, SPN, TN sensation grossly intact  Weak motor with toe flex and extension  Ext warm  + Swelling  No pain with passive stretch   Drain patent   Assessment and Plan   POD/HD#: 1   1. Comminuted L calcaneus fracture s/p ORIF:  POD 1  NWB x 8 weeks  Splint x 2 weeks  Dc drain tomorrow   PT/OT evals  Ice and elevate above heart   2. Medical issues  BP elevated today  Will restart lisinopril component of HTN meds    Acute on chronic pain- see below  Anxiety- home meds  GERD- Home meds  Sleep apnea- RT for CPAP  Fibromyalgia- see below   3. Pain management:  Pt very somnolent this am  Will dc PCA  Will utilize PO meds and breakthrough IV dilaudid  Will start percocet 10/325 1-2 q6h prn pain   + breakthrough Oxy IR 5-10 mg q3h prn between percocet  + dilaudid IV 0.5-1mg  q3h prn severe breakthrough pain   Will dc flexeril, start robaxin   Continue gabapentin   4. ABL anemia/Hemodynamics  Stable H/H   BP slightly elevated  5. DVT/PE prophylaxis:  Lovenox while inpt  Will dc on ASA 325 mg po BID x  8 weeks 6. ID:   Completed periop abx  7. Activity:  NWB L leg  PT/OT consults  Get out of bed today to a chair at the very least  8. FEN/Foley/Lines:  Advance diet as tolerated  Retain foley until PT eval  9.Ex-fix/Splint care:  Keep splint clean and dry   Do not remove splint  10. Dispo:  PT/OT evals  Titrate pain meds accordingly   Hopeful for dc to home Friday or Saturday     Mearl Latin, PA-C [facility] [phone] (P) 05/17/2013 8:13 AM

## 2013-05-17 NOTE — Progress Notes (Signed)
Utilization review completed.  

## 2013-05-17 NOTE — Evaluation (Signed)
Physical Therapy Evaluation Patient Details Name: Sherry [name] MRN: [id number] DOB: [birth year] Today's Date: 05/17/2013 Time: 1140-1200 PT Time Calculation (min): 20 min  PT Assessment / Plan / Recommendation History of Present Illness  Pt is a 52 y/o female admitted s/p ORIF calcaneous fracture.  Clinical Impression  This patient presents with acute pain and decreased functional independence following the above mentioned procedure. At the time of PT eval, pt was in increased pain and had difficulty initiating movement of the LLE. This patient is appropriate for skilled PT interventions to address functional limitations, improve safety and independence with functional mobility, and return to PLOF.     PT Assessment  Patient needs continued PT services    Follow Up Recommendations  Home health PT;Supervision for mobility/OOB    Does the patient have the potential to tolerate intense rehabilitation      Barriers to Discharge Inaccessible home environment      Equipment Recommendations  Rolling walker with 5" wheels;3in1 (PT)    Recommendations for Other Services     Frequency Min 5X/week    Precautions / Restrictions Precautions Precautions: Fall Restrictions Weight Bearing Restrictions: Yes LLE Weight Bearing: Non weight bearing   Pertinent Vitals/Pain 9/10 throughout session.       Mobility  Bed Mobility Bed Mobility: Supine to Sit;Sitting - Scoot to Edge of Bed Supine to Sit: 4: Min assist;HOB flat Sitting - Scoot to Edge of Bed: 5: Supervision Details for Bed Mobility Assistance: Assist for movement of LLE to EOB. VC's for sequencing and technique.  Transfers Transfers: Sit to Stand;Stand to Sit Sit to Stand: 4: Min guard;From bed;With upper extremity assist Stand to Sit: 4: Min guard;To chair/3-in-1;With upper extremity assist Details for Transfer Assistance: VC's for hand placement on seated surface. No physical assist required to come to full  stand. Ambulation/Gait Ambulation/Gait Assistance: 4: Min guard Ambulation Distance (Feet): 3 Feet Assistive device: Rolling walker Ambulation/Gait Assistance Details: Transfer from bed to recliner - VC's for NWB status on LLE, as well as for sequencing and safety awareness with the RW.   Gait Pattern:  (2 point gait pattern as pt is NWB) Gait velocity: Decreased Stairs: No    Exercises     PT Diagnosis: Difficulty walking;Acute pain  PT Problem List: Decreased strength;Decreased range of motion;Decreased activity tolerance;Decreased balance;Decreased mobility;Decreased knowledge of use of DME;Decreased safety awareness;Pain PT Treatment Interventions: DME instruction;Gait training;Stair training;Functional mobility training;Therapeutic activities;Therapeutic exercise;Neuromuscular re-education;Patient/family education     PT Goals(Current goals can be found in the care plan section) Acute Rehab PT Goals Patient Stated Goal: [place] home PT Goal Formulation: With patient/family Time For Goal Achievement: 05/24/13 Potential to Achieve Goals: Good  Visit Information  Last PT Received On: 05/17/13 Assistance Needed: +1 History of Present Illness: Pt is a 52 y/o female admitted s/p ORIF calcaneous fracture.       Prior Functioning  Home Living Family/patient expects to be discharged to:: Private residence Living Arrangements: Children Available Help at Discharge: Family;Available 24 hours/day Type of Home: House Home Access: Stairs to enter [employer] of Steps: 2 Entrance Stairs-Rails: None Home Layout: Two level;Able to live on main level with bedroom/bathroom Alternate Level Stairs-Number of Steps: >10 Alternate Level Stairs-Rails: Can reach both Home Equipment: None Additional Comments: Unclear whether pt is able to stay on the first floor or if she just doesn't want to. Concerned about 10+ steps to get to bedroom.  Prior Function Level of Independence:  Independent Communication Communication: No difficulties Dominant Hand:  Right    Cognition  Cognition Arousal/Alertness: Awake/alert Behavior During Therapy: WFL for tasks assessed/performed Overall Cognitive Status: Within Functional Limits for tasks assessed    Extremity/Trunk Assessment Upper Extremity Assessment Upper Extremity Assessment: Overall WFL for tasks assessed Lower Extremity Assessment Lower Extremity Assessment: LLE deficits/detail LLE Deficits / Details: Decreased strength and acute pain consistent with ORIF calcaneous. LLE: Unable to fully assess due to pain;Unable to fully assess due to immobilization Cervical / Trunk Assessment Cervical / Trunk Assessment: Normal   Balance    End of Session PT - End of Session Equipment Utilized During Treatment: Gait belt;Oxygen Activity Tolerance: Patient limited by pain Patient left: in chair;with call bell/phone within reach;with family/visitor present Nurse Communication: Mobility status  GP     Ruthann Cancer 05/17/2013, 12:46 PM  Ruthann Cancer, PT, DPT [phone]

## 2013-05-17 NOTE — Op Note (Signed)
NAMEMarland Kitchen  KHALIL, BELOTE NO.:  [id number]  MEDICAL RECORD NO.:  [id number]  LOCATION:  5N27C                        FACILITY:  [facility]  PHYSICIAN:  Doralee Albino. Carola Frost, M.D. DATE OF BIRTH:  [birth year]  DATE OF PROCEDURE:  05/16/2013 DATE OF DISCHARGE:                              OPERATIVE REPORT   PREOPERATIVE DIAGNOSIS:  Left calcaneus fracture.  POSTOPERATIVE DIAGNOSIS:  Left calcaneus fracture.  PROCEDURE:  Open reduction and internal fixation of left calcaneus.  SURGEON:  Doralee Albino. Carola Frost, M.D.  ASSISTANT:  Mearl Latin, [place].  ANESTHESIA:  General.  COMPLICATIONS:  None.  TOURNIQUET:  93 minutes.  DISPOSITION:  To PACU.  CONDITION:  Stable.  BRIEF SUMMARY AND INDICATION FOR PROCEDURE:  Sherry [name] is a 52- year-old female who sustained a calcaneus fracture several weeks ago. Initial plan for surgery was delayed secondary to anginal chest pain. Subsequent workup by Dr. Dietrich Pates which did not demonstrate any underlying vascular disease.  I did discuss with the patient risks and benefits of surgical repair including the possibility of infection, nerve injury, vessel injury, DVT, PE, heart attack, stroke, need for further surgery, and failure to prevent the subtalar arthritis that could lead to the need for subtalar fusion, loss of motion, and many others.  Furthermore, they did understand that wound infection could lead to osteomyelitis and limb loss.  She did wish to proceed.  BRIEF DESCRIPTION OF PROCEDURE:  Ms. Draeger received preoperative antibiotics, taken to the operating room where general anesthesia was induced.  Her left lower extremity was prepped and draped in usual sterile fashion.  Tourniquet was placed about the thigh.  She was positioned in the so-called lazy lateral position.  A standard extended lateral approach was made to the calcaneus carefully, watched for protecting the sural nerve.  Distally there appeared to be 2  branches and the more plantar branch was incised to prevent excessive stretches it, was required for mobilization.  The anterior and larger branch was protected in its entirety.  There was considerable lateral wall blow out and varus of the hindfoot as well as depression of the articular surface.  The peroneal tendons were carefully elevated and then K-wires used to hold the flap using an atraumatic technique.  The lateral wall was removed with an osteotome revealing the depressed articular segment. This was mobilized with a Cobb and then osteoplasty performed of the anterior process in order to restore appropriate angulation and remove some of the lateral prominence.  The tuberosity was controlled with use of a transosseous K-wire and tension bow.  My assistant, Montez Morita pulled distraction of the tuberosity and valgus angulation while I placed provisional K-wires using a lamina spreader to elevate the articular block in the appropriate position, again pinning it provisionally, both in the area of the subtalar joint and then along the anterior tuberosity.  A Biomet perimeter plate was then used securing fixation in the tuberosity, subtalar region, anterior process.  Final images showed appropriate reduction, which could be visualized directly of the subtalar joint as well as appropriate placement of the fixation regarding trajectory and length.  The wound was irrigated thoroughly. Deep drain was placed.  The deep flap closed with  2-0 Vicryl sutures and then 3-0 Vicryl and 3-0 nylon.  Sterile gently compressive dressing was applied.  Montez Morita, PA-C, again did assist throughout and was necessary for the completion of this procedure.  PROGNOSIS:  The patient will be nonweightbearing with unrestricted motion as soon as her splint is removed in 2 weeks.  We have placed her into eversion and extension to reduce the chance of contracture and compliance with therapy will be required to  maximize her clinical recovery.     Doralee Albino. Carola Frost, M.D.     [facility]/MEDQ  D:  05/16/2013  T:  05/17/2013  Job:  [id number]

## 2013-05-17 NOTE — Progress Notes (Signed)
Orthopedic Tech Progress Note Patient Details:  Sherry [name] 1961/01/25 [id number]   Trapeze bar   [name], [name] 05/17/2013, 9:13 AM

## 2013-05-18 ENCOUNTER — Encounter (HOSPITAL_COMMUNITY): Payer: Self-pay | Admitting: Orthopedic Surgery

## 2013-05-18 DIAGNOSIS — G4730 Sleep apnea, unspecified: Secondary | ICD-10-CM | POA: Diagnosis present

## 2013-05-18 LAB — CBC
Hemoglobin: 10.5 g/dL — ABNORMAL LOW (ref 12.0–15.0)
MCH: 30.3 pg (ref 26.0–34.0)
MCV: 92.5 fL (ref 78.0–100.0)
Platelets: 387 10*3/uL (ref 150–400)
RBC: 3.46 MIL/uL — ABNORMAL LOW (ref 3.87–5.11)
RDW: 13.7 % (ref 11.5–15.5)
WBC: 9.1 10*3/uL (ref 4.0–10.5)

## 2013-05-18 LAB — BASIC METABOLIC PANEL
Calcium: 9.5 mg/dL (ref 8.4–10.5)
Creatinine, Ser: 0.7 mg/dL (ref 0.50–1.10)
GFR calc Af Amer: >90 mL/min (ref >90)
GFR calc non Af Amer: >90 mL/min (ref >90)
Potassium: 4.2 mEq/L (ref 3.5–5.1)
Sodium: 135 mEq/L (ref 135–145)

## 2013-05-18 MED ORDER — METHOCARBAMOL 500 MG PO TABS: 500.0000 mg | ORAL_TABLET | Freq: Four times a day (QID) | ORAL | Status: DC | PRN

## 2013-05-18 MED ORDER — ASPIRIN EC 325 MG PO TBEC: 325.0000 mg | DELAYED_RELEASE_TABLET | Freq: Two times a day (BID) | ORAL | Status: DC

## 2013-05-18 MED ORDER — DSS 100 MG PO CAPS: 100.0000 mg | ORAL_CAPSULE | Freq: Two times a day (BID) | ORAL | Status: DC

## 2013-05-18 MED ORDER — OXYCODONE HCL 5 MG PO TABS: 5.0000 mg | ORAL_TABLET | ORAL | Status: DC | PRN

## 2013-05-18 MED ORDER — OXYCODONE-ACETAMINOPHEN 5-325 MG PO TABS: 1.0000 | ORAL_TABLET | Freq: Four times a day (QID) | ORAL | Status: DC | PRN

## 2013-05-18 NOTE — Evaluation (Signed)
Occupational Therapy Evaluation Patient Details Name: Sherry [name] MRN: [id number] DOB: 25-Feb-1961 Today's Date: 05/18/2013 Time: 7829-5621 OT Time Calculation (min): 23 min  OT Assessment / Plan / Recommendation History of present illness Pt is a 52 y/o female admitted s/p ORIF calcaneous fracture.   Clinical Impression   Pt admitted w/ dx as stated above. Pt currently demonstrates deficits in ability to perform functional mobility and transfers as well as ADL's. She will benefit from acute OT to assist in maximizing independence in ADL's prior to anticipated d/c home.   OT Assessment  Patient needs continued OT Services    Follow Up Recommendations  No OT follow up;Supervision - Intermittent    Barriers to Discharge      Equipment Recommendations  3 in 1 bedside comode;Tub/shower seat    Recommendations for Other Services    Frequency  Min 2X/week    Precautions / Restrictions Precautions Precautions: Fall Restrictions Weight Bearing Restrictions: Yes LLE Weight Bearing: Non weight bearing   Pertinent Vitals/Pain Pt reports LLE pain, did not rate. Repositioned, rest, ice applied after treatment session.    ADL  Eating/Feeding: Performed;Independent Where Assessed - Eating/Feeding: Chair Grooming: Performed;Wash/dry hands;Supervision/safety Where Assessed - Grooming: Supported standing Upper Body Bathing: Simulated;Set up Where Assessed - Upper Body Bathing: Unsupported sitting;Supported sitting Lower Body Bathing: Simulated;Minimal assistance Where Assessed - Lower Body Bathing: Supported sit to stand Upper Body Dressing: Simulated;Modified independent Where Assessed - Upper Body Dressing: Supported sitting;Unsupported sitting Lower Body Dressing: Simulated;Minimal assistance Where Assessed - Lower Body Dressing: Supported sit to stand Toilet Transfer: Performed;Supervision/safety;Min Pension scheme manager Method: Sit to Barista:  Comfort height toilet;Grab bars Toileting - Architect and Hygiene: Performed;Min guard;Supervision/safety Where Assessed - Engineer, mining and Hygiene: Sit to stand from 3-in-1 or toilet Tub/Shower Transfer Method: Not assessed Equipment Used: Gait belt;Rolling walker Transfers/Ambulation Related to ADLs: Pt overall Supervision - Min guard assist for functional transfers related to ADL's using RW. ADL Comments: Pt was educated in role of OT. Discussed LB dressing techniques w/ pt & her son. Pt participated in toileting transfer ambulating into bathroom w/ RW & maintaining NWB status of LLE. Pt stood at sink for grooming (leaning on forearms for support at sink). She will benefit from 3:1 at home for increased safety. Discussed shower transfers, pt may benefit from shower seat w/ back at home as well.    OT Diagnosis: Generalized weakness;Acute pain  OT Problem List: Decreased activity tolerance;Decreased knowledge of use of DME or AE;Pain OT Treatment Interventions: Self-care/ADL training;DME and/or AE instruction;Patient/family education;Therapeutic activities   OT Goals(Current goals can be found in the care plan section) Acute Rehab OT Goals Patient Stated Goal: Go home w/ family assist Time For Goal Achievement: 06/01/13 Potential to Achieve Goals: Good  Visit Information  Last OT Received On: 05/18/13 Assistance Needed: +1 History of Present Illness: Pt is a 52 y/o female admitted s/p ORIF calcaneous fracture.       Prior Functioning     Home Living Family/patient expects to be discharged to:: Private residence Living Arrangements: Children Available Help at Discharge: Family;Available 24 hours/day Type of Home: House Home Access: Level entry (If goes around to back of house and stays in bedroom in basement) Home Layout: Two level;Other (Comment) (Able to live on lower level w/ bed/bathroom) Alternate Level Stairs-Number of Steps: >10 Alternate  Level Stairs-Rails: Can reach both Home Equipment: None Prior Function Level of Independence: Independent Communication Communication: No difficulties Dominant Hand: Right  Vision/Perception Vision - History Baseline Vision: Other (comment) (wears for reading and distance) Patient Visual Report: No change from baseline   Cognition  Cognition Arousal/Alertness: Awake/alert Behavior During Therapy: WFL for tasks assessed/performed Overall Cognitive Status: Within Functional Limits for tasks assessed    Extremity/Trunk Assessment Upper Extremity Assessment Upper Extremity Assessment: Overall WFL for tasks assessed Lower Extremity Assessment Lower Extremity Assessment: Defer to PT evaluation LLE Deficits / Details: Decreased strength and acute pain consistent with ORIF calcaneous. LLE: Unable to fully assess due to pain;Unable to fully assess due to immobilization Cervical / Trunk Assessment Cervical / Trunk Assessment: Normal    Mobility Bed Mobility Bed Mobility: Not assessed (Pt up in chair) Transfers Transfers: Sit to Stand;Stand to Sit Sit to Stand: 5: Supervision;From chair/3-in-1;From toilet;With armrests;With upper extremity assist Stand to Sit: 5: Supervision;4: Min guard;With armrests;To chair/3-in-1;To toilet Details for Transfer Assistance: VC's for hand placement on seated surface. No physical assist required to come to full stand.           End of Session OT - End of Session Equipment Utilized During Treatment: Gait belt;Rolling walker Activity Tolerance: Patient tolerated treatment well Patient left: in chair;with call bell/phone within reach;with family/visitor present Nurse Communication: Other (comment) (DME recommendations)  GO     Roselie Awkward Dixon 05/18/2013, 11:32 AM

## 2013-05-18 NOTE — Discharge Summary (Signed)
Orthopaedic Trauma Service (OTS)  Patient ID: Sherry [name] MRN: [id number] DOB/AGE: Jan 23, 1961 52 y.o.  Admit date: 05/16/2013 Discharge date: 05/18/2013  Admission Diagnoses: Left calcaneus fracture GERD Fibromyalgia Obesity HTN Sleep apnea   Discharge Diagnoses:  Active Problems:   GERD   Fibromyalgia   Anxiety   Hypertension   Obesity   Calcaneus fracture, left   Unspecified sleep apnea   Procedures Performed: 05/16/2013- Dr. Carola Frost ORIF Left calcaneus fracture   Discharged Condition: good  Hospital Course:   52 year old black female who sustained a fall at home proximally 7 weeks ago with resultant calcaneus fracture with significant intra-articular component. The patient was seen several days after her injury but significant swelling prevented early ORIF. Please see H&P for full summary. After appropriate cardiac clearance patient was taken to the operating room on the date noted above. For the procedure described up above. Patient tolerated procedure well and was transferred to the orthopedic floor for continued observation and pain control. Her hospital stay was relatively uncomplicated. On postoperative day one she was doing well but requiring IV pain medicine. Her PCA was discontinued on the afternoon of postoperative day #1. She was also started on Lovenox on postoperative day 1 for DVT and PE prophylaxis. She did mobilize a little bit with PT and OT on postop day #1. She continued to progress well the next several days tolerating a diet and voiding without difficulty. Ultimately on postoperative day #3 she was deemed to be stable for discharge to home with home health under the supervision of her family. No other issues were noted. We did start her lisinopril component of her hypertensive medications on postoperative day #1 do to some elevated blood pressures. She did well otherwise.  Consults: None  Significant Diagnostic Studies: labs:  Results for  Sherry, [name] (MRN [id number]) as of 05/19/2013 08:35  Ref. Range 05/19/2013 06:35  Sodium Latest Range: 135-145 mEq/L 135  Potassium Latest Range: 3.5-5.1 mEq/L 4.1  Chloride Latest Range: 96-112 mEq/L 98  CO2 Latest Range: 19-32 mEq/L 29  BUN Latest Range: 6-23 mg/dL 6  Creatinine Latest Range: 0.50-1.10 mg/dL 7.82  Calcium Latest Range: 8.4-10.5 mg/dL 9.4  GFR calc non Af Amer Latest Range: >90 mL/min >90  GFR calc Af Amer Latest Range: >90 mL/min >90  Glucose Latest Range: 70-99 mg/dL 956 (H)  WBC Latest Range: 4.0-10.5 K/uL 8.7  RBC Latest Range: 3.87-5.11 MIL/uL 3.43 (L)  Hemoglobin Latest Range: 12.0-15.0 g/dL 21.3 (L)  HCT Latest Range: 36.0-46.0 % 31.4 (L)  MCV Latest Range: 78.0-100.0 fL 91.5  MCH Latest Range: 26.0-34.0 pg 30.9  MCHC Latest Range: 30.0-36.0 g/dL 08.6  RDW Latest Range: 11.5-15.5 % 13.6  Platelets Latest Range: 150-400 K/uL 389   Treatments: IV hydration, antibiotics: Ancef, analgesia: acetaminophen, Dilaudid and oxycodone and percocet, anticoagulation: ASA and LMW heparin, therapies: PT, OT and RN and surgery: as above   Discharge Exam:    Orthopaedic Trauma Service Progress Note  Subjective  Doing great Ready to go home Pain controlled No new issues   Review of Systems  Constitutional: Negative for fever and chills.  Respiratory: Negative for shortness of breath and wheezing.   Cardiovascular: Negative for chest pain and palpitations.  Gastrointestinal: Negative for nausea, vomiting and abdominal pain.  Genitourinary: Negative for dysuria.  Neurological: Negative for tingling, sensory change and headaches.     Objective   BP 119/60  Pulse 106  Temp(Src) 99.1 F (37.3 C) (Oral)  Resp 18  SpO2 93%  Intake/Output     12/11 0701 - 12/12 0700 12/12 0701 - 12/13 0700    P.O. 120     I.V.      Total Intake 120      Urine      Drains      Total Output        Net +120            Urine Occurrence 2 x       Labs  Results for  Sherry, [name] (MRN [id number]) as of 05/19/2013 08:35   Ref. Range  05/19/2013 06:35   Sodium  Latest Range: 135-145 mEq/L  135   Potassium  Latest Range: 3.5-5.1 mEq/L  4.1   Chloride  Latest Range: 96-112 mEq/L  98   CO2  Latest Range: 19-32 mEq/L  29   BUN  Latest Range: 6-23 mg/dL  6   Creatinine  Latest Range: 0.50-1.10 mg/dL  4.09   Calcium  Latest Range: 8.4-10.5 mg/dL  9.4   GFR calc non Af Amer  Latest Range: >90 mL/min  >90   GFR calc Af Amer  Latest Range: >90 mL/min  >90   Glucose  Latest Range: 70-99 mg/dL  811 (H)   WBC  Latest Range: 4.0-10.5 K/uL  8.7   RBC  Latest Range: 3.87-5.11 MIL/uL  3.43 (L)   Hemoglobin  Latest Range: 12.0-15.0 g/dL  91.4 (L)   HCT  Latest Range: 36.0-46.0 %  31.4 (L)   MCV  Latest Range: 78.0-100.0 fL  91.5   MCH  Latest Range: 26.0-34.0 pg  30.9   MCHC  Latest Range: 30.0-36.0 g/dL  78.2   RDW  Latest Range: 11.5-15.5 %  13.6   Platelets  Latest Range: 150-400 K/uL  389     Exam  Gen: awake and alert, NAD, comfortable Lungs: clear B   Cardiac: s1 and s2 Abd: + BS, NTND Ext:        Left Lower Extremity             Splint C/d/I             DPN, SPN, TN sensation grossly intact             Weak motor with toe flex and extension             Ext warm             + Swelling             No pain with passive stretch     Assessment and Plan   POD/HD#: 3  52 y/o black female s/p fall with L calcaneus fracture s/p ORIF    1. Comminuted L calcaneus fracture s/p ORIF:             POD 3             NWB x 8 weeks             Splint x 2 weeks              PT/OT               Ice and elevate above heart             HH eval                2. Medical issues  HTN- continue to monitor               Acute on chronic pain- see below             Anxiety- home meds             GERD- Home meds             Sleep apnea- no CPAP at home               Fibromyalgia- see below              3. Pain management:            continue with current PO regimen   4. ABL anemia/Hemodynamics             Stable H/H     5. DVT/PE prophylaxis:             Lovenox while inpt             Will dc on ASA 325 mg po BID x 8 weeks 6. ID:               Completed periop abx  7. Activity:             NWB L leg             PT/OT  8. FEN/Foley/Lines:             Advance diet as tolerated             dc lines   9.Ex-fix/Splint care:             Keep splint clean and dry               Do not remove splint  10. Dispo:             PT/OT                          dc home today               Follow up with ortho in 2-3 weeks             Discussed splint care with family and pt    Mearl Latin, PA-C [facility] [phone] (P) 05/19/2013 8:34 AM        Disposition: 01-Home or Self Care      Discharge Orders   Future Appointments Provider Department Dept Phone   06/12/2013 10:20 AM Ranelle Oyster, MD [facility] [phone]   Future Orders Complete By Expires   Call MD / Call 911  As directed    Comments:     If you experience chest pain or shortness of breath, CALL 911 and be transported to the hospital emergency room.  If you develope a fever above 101 F, pus (white drainage) or increased drainage or redness at the wound, or calf pain, call your surgeon's office.   Constipation Prevention  As directed    Comments:     Drink plenty of fluids.  Prune juice may be helpful.  You may use a stool softener, such as Colace (over the counter) 100 mg twice a day.  Use MiraLax (over the counter) for constipation as needed.   Diet - low sodium heart healthy  As directed    Discharge instructions  As directed    Comments:  Orthopaedic Trauma Service Discharge Instructions   General Discharge Instructions  WEIGHT BEARING STATUS: Nonweight bearing Left Leg  RANGE OF MOTION/ACTIVITY: no range of motion at this time for Left ankle and foot. Ok to move knee.   Do no remove splint   Diet: as you were eating previously.  Can use over the counter stool softeners and bowel preparations, such as Miralax, to help with bowel movements.  Narcotics can be constipating.  Be sure to drink plenty of fluids  STOP SMOKING OR USING NICOTINE PRODUCTS!!!!  As discussed nicotine severely impairs your body's ability to heal surgical and traumatic wounds but also impairs bone healing.  Wounds and bone heal by forming microscopic blood vessels (angiogenesis) and nicotine is a vasoconstrictor (essentially, shrinks blood vessels).  Therefore, if vasoconstriction occurs to these microscopic blood vessels they essentially disappear and are unable to deliver necessary nutrients to the healing tissue.  This is one modifiable factor that you can do to dramatically increase your chances of healing your injury.    (This means no smoking, no nicotine gum, patches, etc)  DO NOT USE NONSTEROIDAL ANTI-INFLAMMATORY DRUGS (NSAID'S)  Using products such as Advil (ibuprofen), Aleve (naproxen), Motrin (ibuprofen) for additional pain control during fracture healing can delay and/or prevent the healing response.  If you would like to take over the counter (OTC) medication, Tylenol (acetaminophen) is ok.  However, some narcotic medications that are given for pain control contain acetaminophen as well. Therefore, you should not exceed more than 4000 mg of tylenol in a day if you do not have liver disease.  Also note that there are may OTC medicines, such as cold medicines and allergy medicines that my contain tylenol as well.  If you have any questions about medications and/or interactions please ask your doctor/PA or your pharmacist.   PAIN MEDICATION USE AND EXPECTATIONS  You have likely been given narcotic medications to help control your pain.  After a traumatic event that results in an fracture (broken bone) with or without surgery, it is ok to use narcotic pain medications to help control one's  pain.  We understand that everyone responds to pain differently and each individual patient will be evaluated on a regular basis for the continued need for narcotic medications. Ideally, narcotic medication use should last no more than 6-8 weeks (coinciding with fracture healing).   As a patient it is your responsibility as well to monitor narcotic medication use and report the amount and frequency you use these medications when you come to your office visit.   We would also advise that if you are using narcotic medications, you should take a dose prior to therapy to maximize you participation.  IF YOU ARE ON NARCOTIC MEDICATIONS IT IS NOT PERMISSIBLE TO OPERATE A MOTOR VEHICLE (MOTORCYCLE/CAR/TRUCK/MOPED) OR HEAVY MACHINERY DO NOT MIX NARCOTICS WITH OTHER CNS (CENTRAL NERVOUS SYSTEM) DEPRESSANTS SUCH AS ALCOHOL       ICE AND ELEVATE INJURED/OPERATIVE EXTREMITY  Using ice and elevating the injured extremity above your heart can help with swelling and pain control.  Icing in a pulsatile fashion, such as 20 minutes on and 20 minutes off, can be followed.    Do not place ice directly on skin. Make sure there is a barrier between to skin and the ice pack.    Using frozen items such as frozen peas works well as the conform nicely to the are that needs to be iced.  USE AN ACE WRAP OR TED HOSE FOR SWELLING CONTROL  In addition to icing and elevation, Ace wraps or TED hose are used to help limit and resolve swelling.  It is recommended to use Ace wraps or TED hose until you are informed to stop.    When using Ace Wraps start the wrapping distally (farthest away from the body) and wrap proximally (closer to the body)   Example: If you had surgery on your leg or thing and you do not have a splint on, start the ace wrap at the toes and work your way up to the thigh        If you had surgery on your upper extremity and do not have a splint on, start the ace wrap at your fingers and work your way up to the  upper arm  IF YOU ARE IN A SPLINT OR CAST DO NOT REMOVE IT FOR ANY REASON   If your splint gets wet for any reason please contact the office immediately. You may shower in your splint or cast as long as you keep it dry.  This can be done by wrapping in a cast cover or garbage back (or similar)  Do Not stick any thing down your splint or cast such as pencils, money, or hangers to try and scratch yourself with.  If you feel itchy take benadryl as prescribed on the bottle for itching  IF YOU ARE IN A CAM BOOT (BLACK BOOT)  You may remove boot periodically. Perform daily dressing changes as noted below.  Wash the liner of the boot regularly and wear a sock when wearing the boot. It is recommended that you sleep in the boot until told otherwise  CALL THE OFFICE WITH ANY QUESTIONS OR CONCERTS: [phone]     Discharge Pin Site Instructions  Dress pins daily with Kerlix roll starting on POD 2. Wrap the Kerlix so that it tamps the skin down around the pin-skin interface to prevent/limit motion of the skin relative to the pin.  (Pin-skin motion is the primary cause of pain and infection related to external fixator pin sites).  Remove any crust or coagulum that may obstruct drainage with a saline moistened gauze or soap and water.  After POD 3, if there is no discernable drainage on the pin site dressing, the interval for change can by increased to every other day.  You may shower with the fixator, cleaning all pin sites gently with soap and water.  If you have a surgical wound this needs to be completely dry and without drainage before showering.  The extremity can be lifted by the fixator to facilitate wound care and transfers.  Notify the office/Doctor if you experience increasing drainage, redness, or pain from a pin site, or if you notice purulent (thick, snot-like) drainage.  Discharge Wound Care Instructions  Do NOT apply any ointments, solutions or lotions to pin sites or surgical  wounds.  These prevent needed drainage and even though solutions like hydrogen peroxide kill bacteria, they also damage cells lining the pin sites that help fight infection.  Applying lotions or ointments can keep the wounds moist and can cause them to breakdown and open up as well. This can increase the risk for infection. When in doubt call the office.  Surgical incisions should be dressed daily.  If any drainage is noted, use one layer of adaptic, then gauze, Kerlix, and an ace wrap.  Once the incision is completely dry and without drainage, it may be left open to air out.  Showering may begin 36-48  hours later.  Cleaning gently with soap and water.  Traumatic wounds should be dressed daily as well.    One layer of adaptic, gauze, Kerlix, then ace wrap.  The adaptic can be discontinued once the draining has ceased    If you have a wet to dry dressing: wet the gauze with saline the squeeze as much saline out so the gauze is moist (not soaking wet), place moistened gauze over wound, then place a dry gauze over the moist one, followed by Kerlix wrap, then ace wrap.   Driving restrictions  As directed    Comments:     No driving   Increase activity slowly as tolerated  As directed    Non weight bearing  As directed    Questions:     Laterality:     Extremity:         Medication List    STOP taking these medications       aspirin 81 MG chewable tablet  Replaced by:  aspirin EC 325 MG tablet     cyclobenzaprine 10 MG tablet  Commonly known as:  FLEXERIL     naproxen 500 MG tablet  Commonly known as:  NAPROSYN     promethazine 25 MG tablet  Commonly known as:  PHENERGAN      TAKE these medications       ALPRAZolam 1 MG tablet  Commonly known as:  XANAX  Take 1 tablet (1 mg total) by mouth 2 (two) times daily as needed for sleep or anxiety.     aspirin EC 325 MG tablet  Take 1 tablet (325 mg total) by mouth every 12 (twelve) hours.     DSS 100 MG Caps  Take 100 mg by  mouth 2 (two) times daily.     esomeprazole 20 MG capsule  Commonly known as:  NEXIUM  Take 1 capsule (20 mg total) by mouth daily before breakfast.     gabapentin 100 MG capsule  Commonly known as:  NEURONTIN  Take 100 mg by mouth at bedtime.     lisinopril-hydrochlorothiazide 20-25 MG per tablet  Commonly known as:  PRINZIDE,ZESTORETIC  Take 1 tablet by mouth daily.     methocarbamol 500 MG tablet  Commonly known as:  ROBAXIN  Take 1-2 tablets (500-1,000 mg total) by mouth every 6 (six) hours as needed for muscle spasms.     oxyCODONE 5 MG immediate release tablet  Commonly known as:  Oxy IR/ROXICODONE  Take 1-3 tablets (5-15 mg total) by mouth every 3 (three) hours as needed for breakthrough pain (take in between percocet for breakthrough pain).     oxyCODONE-acetaminophen 5-325 MG per tablet  Commonly known as:  PERCOCET/ROXICET  Take 1-2 tablets by mouth every 6 (six) hours as needed for moderate pain or severe pain.     simvastatin 10 MG tablet  Commonly known as:  ZOCOR  Take 10 mg by mouth daily at 6 PM.     zolpidem 10 MG tablet  Commonly known as:  AMBIEN  Take 10 mg by mouth at bedtime as needed for sleep.       Follow-up Information   Follow up with Budd Palmer, MD.   Specialty:  Orthopedic Surgery   Contact information:   [address] [place] [address] [phone]       Discharge Instructions and Plan:  Pt will be NWB x 8 weeks, splinted for 2 weeks and will begin ROM exercises after splint is  removed at post op visit Sutures will remain in for 3 weeks due to slow healing of soft tissue flap She will continue to Ice and elevate as much as possible Pt can be as active as she can be while maintaining WB restrictions  We will check her back at the office in 2-3 weeks for removal of splint, xrays and eval of operative wound Pt was covered with lovenox while inpatient and will be discharged on ASA 325 mg BID x 6-8 weeks Resume  pre-hospital diet  Pain control with percocet, oxy IR and robaxin.  She will be referred back to Dr. Riley Kill for her chronic pain issues once her acute pain has resolved and once her calcaneus fracture is healed   Signed:  Mearl Latin, PA-C [facility] [phone] (P) 05/18/2013, 9:35 AM

## 2013-05-18 NOTE — Progress Notes (Signed)
Physical Therapy Treatment Patient Details Name: Sherry [name] MRN: [id number] DOB: 04-10-1961 Today's Date: 05/18/2013 Time: 4098-1191 PT Time Calculation (min): 25 min  PT Assessment / Plan / Recommendation  History of Present Illness Pt is a 52 y/o female admitted s/p ORIF calcaneous fracture.   PT Comments   Pt progressing with mobility today despite pain.  Pt states plans are for her to d/c home tomorrow.  Daughter & pt state that she will not have to negotiate steps to get inside house & once she's in she can stay on main level until HHPT comes to home.     Follow Up Recommendations  Home health PT;Supervision for mobility/OOB     Does the patient have the potential to tolerate intense rehabilitation     Barriers to Discharge        Equipment Recommendations  Rolling walker with 5" wheels;3in1 (PT)    Recommendations for Other Services    Frequency Min 5X/week   Progress towards PT Goals Progress towards PT goals: Progressing toward goals  Plan Current plan remains appropriate    Precautions / Restrictions Precautions Precautions: Fall Restrictions Weight Bearing Restrictions: Yes LLE Weight Bearing: Non weight bearing   Pertinent Vitals/Pain 8/10 Lt LE.  Repositioned & propped up on pillow at end of session.  Pt states she had pain medication ~1 hr prior to therapy.        Mobility  Bed Mobility Bed Mobility: Supine to Sit;Sitting - Scoot to Edge of Bed Supine to Sit: 4: Min assist;With rails Sitting - Scoot to [place] of Bed: 4: Min assist Details for Bed Mobility Assistance: (A) for LLE management & support as she was scooting to EOB Transfers Transfers: Sit to Stand;Stand to Sit;Stand Pivot Transfers Sit to Stand: With upper extremity assist;With armrests;From bed;From chair/3-in-1;4: Min guard Stand to Sit: 4: Min guard;With upper extremity assist;With armrests;To chair/3-in-1 Stand Pivot Transfers: 4: Min guard Details for Transfer Assistance: cues for  hand placement & technique.  Pt did well maintaining NWBing LLE.   Ambulation/Gait Ambulation/Gait Assistance: 4: Min guard Ambulation Distance (Feet): 15 Feet Assistive device: Rolling walker Ambulation/Gait Assistance Details: cues for technique.  Maintained NWBIng.  steady but limited by pain & fatigue.   Gait Pattern: Step-to pattern Gait velocity: Decreased Stairs: No      PT Goals (current goals can now be found in the care plan section) Acute Rehab PT Goals Patient Stated Goal: Go home w/ family assist PT Goal Formulation: With patient/family Time For Goal Achievement: 05/24/13 Potential to Achieve Goals: Good  Visit Information  Last PT Received On: 05/18/13 Assistance Needed: +1 History of Present Illness: Pt is a 52 y/o female admitted s/p ORIF calcaneous fracture.    Subjective Data  Patient Stated Goal: Go home w/ family assist   Cognition  Cognition Arousal/Alertness: Awake/alert Behavior During Therapy: WFL for tasks assessed/performed Overall Cognitive Status: Within Functional Limits for tasks assessed    Balance     End of Session PT - End of Session Equipment Utilized During Treatment: Gait belt Activity Tolerance: Patient tolerated treatment well Patient left: in chair;with call bell/phone within reach;with family/visitor present Nurse Communication: Mobility status   GP     Lara Mulch 05/18/2013, 2:08 PM  Verdell Face, PTA [phone] 05/18/2013

## 2013-05-18 NOTE — Progress Notes (Signed)
[facility] Progress Note  Subjective  Doing ok this am Did well with therapy yesterday  Pain tolerable Tolerating diet Voiding w/o difficulty   Review of Systems  Constitutional: Negative for fever and chills.  Respiratory: Negative for shortness of breath.   Cardiovascular: Negative for chest pain.  Gastrointestinal: Negative for nausea, vomiting and abdominal pain.  Genitourinary: Negative for dysuria.  Neurological: Negative for tingling, sensory change and headaches.     Objective   BP 103/55  Pulse 104  Temp(Src) 98 F (36.7 C) (Oral)  Resp 18  SpO2 99%  Intake/Output     12/10 0701 - 12/11 0700 12/11 0701 - 12/12 0700   P.O. 720    I.V. 240    IV Piggyback     Total Intake 960     Urine 3850    Drains 10    Total Output 3860     Net -2900          Urine Occurrence 2 x      Labs  Results for Sherry, [name] (MRN [id number]) as of 05/18/2013 08:48  Ref. Range 05/18/2013 04:15  Sodium Latest Range: 135-145 mEq/L 135  Potassium Latest Range: 3.5-5.1 mEq/L 4.2  Chloride Latest Range: 96-112 mEq/L 97  CO2 Latest Range: 19-32 mEq/L 31  BUN Latest Range: 6-23 mg/dL 6  Creatinine Latest Range: 0.50-1.10 mg/dL 4.09  Calcium Latest Range: 8.4-10.5 mg/dL 9.5  GFR calc non Af Amer Latest Range: >90 mL/min >90  GFR calc Af Amer Latest Range: >90 mL/min >90  Glucose Latest Range: 70-99 mg/dL 811 (H)  WBC Latest Range: 4.0-10.5 K/uL 9.1  RBC Latest Range: 3.87-5.11 MIL/uL 3.46 (L)  Hemoglobin Latest Range: 12.0-15.0 g/dL 91.4 (L)  HCT Latest Range: 36.0-46.0 % 32.0 (L)  MCV Latest Range: 78.0-100.0 fL 92.5  MCH Latest Range: 26.0-34.0 pg 30.3  MCHC Latest Range: 30.0-36.0 g/dL 78.2  RDW Latest Range: 11.5-15.5 % 13.7  Platelets Latest Range: 150-400 K/uL 387    Exam  Gen: much more awake this am, A&O to person, place and time Lungs: clear B Cardiac: s1 and s2, RRR Abd: soft, NTND, +BS Ext:       Left Lower Extremity  Splint C/d/I             DPN, SPN, TN sensation grossly intact             Weak motor with toe flex and extension             Ext warm             + Swelling             No pain with passive stretch               Drain d/c'd this am    Assessment and Plan    POD/HD#: 2   1. Comminuted L calcaneus fracture s/p ORIF:             POD 2             NWB x 8 weeks             Splint x 2 weeks             Dc'd drain              PT/OT              Ice and elevate above heart  HH eval  2. Medical issues                         HTN- continue to monitor              Acute on chronic pain- see below             Anxiety- home meds             GERD- Home meds             Sleep apnea- no CPAP at home              Fibromyalgia- see below              3. Pain management:           continue with current PO regimen   4. ABL anemia/Hemodynamics             Stable H/H     5. DVT/PE prophylaxis:             Lovenox while inpt             Will dc on ASA 325 mg po BID x 8 weeks 6. ID:               Completed periop abx  7. Activity:             NWB L leg             PT/OT  8. FEN/Foley/Lines:             Advance diet as tolerated  NSL IVF  9.Ex-fix/Splint care:             Keep splint clean and dry               Do not remove splint  10. Dispo:             PT/OT               Discussed with Pt dc'ing home this afternoon.  Will complete all paperwork for her to do so. If she feels ready she can, if not will dc home tomorrow      Mearl Latin, PA-C [facility] [phone] (P) 05/18/2013 8:46 AM

## 2013-05-18 NOTE — Care Management Note (Signed)
CARE MANAGEMENT NOTE 05/18/2013  Patient:  Sherry [name], Sherry [name]   Account Number:  [id number]  Date Initiated:  05/18/2013  Documentation initiated by:  Vance Peper  Subjective/Objective Assessment:   52 yr old female admitted for left calcaneous fracture     Action/Plan:   CM spoke with patient concerning home health and DME needs. Has assistance at discharge.   Anticipated DC Date:  05/18/2013   Anticipated DC Plan:  HOME W HOME HEALTH SERVICES      DC Planning Services  CM consult      PAC Choice  DURABLE MEDICAL EQUIPMENT  HOME HEALTH   Choice offered to / List presented to:     DME arranged  3-N-1  [facility]  [facility]      DME agency  [facility]     HH arranged  HH-2 PT      [facility] agency  [facility]   Status of service:  Completed, signed off  Discharge Disposition:  HOME W HOME HEALTH SERVICES

## 2013-05-19 ENCOUNTER — Encounter (HOSPITAL_COMMUNITY): Payer: Self-pay | Admitting: Emergency Medicine

## 2013-05-19 LAB — BASIC METABOLIC PANEL
CO2: 29 mEq/L (ref 19–32)
Calcium: 9.4 mg/dL (ref 8.4–10.5)
Chloride: 98 mEq/L (ref 96–112)
Creatinine, Ser: 0.77 mg/dL (ref 0.50–1.10)
GFR calc Af Amer: >90 mL/min (ref >90)
GFR calc non Af Amer: >90 mL/min (ref >90)
Potassium: 4.1 mEq/L (ref 3.5–5.1)
Sodium: 135 mEq/L (ref 135–145)

## 2013-05-19 LAB — CBC
HCT: 31.4 % — ABNORMAL LOW (ref 36.0–46.0)
MCV: 91.5 fL (ref 78.0–100.0)
Platelets: 389 10*3/uL (ref 150–400)
RBC: 3.43 MIL/uL — ABNORMAL LOW (ref 3.87–5.11)
RDW: 13.6 % (ref 11.5–15.5)
WBC: 8.7 10*3/uL (ref 4.0–10.5)

## 2013-05-19 NOTE — Progress Notes (Signed)
I have seen and examined the patient. I agree with the findings above.  Budd Palmer, MD 05/19/2013 11:20 AM

## 2013-05-19 NOTE — Progress Notes (Signed)
Physical Therapy Treatment Patient Details Name: Sherry [name] MRN: [id number] DOB: [birth year] Today's Date: 05/19/2013 Time: 1191-4782 PT Time Calculation (min): 16 min  PT Assessment / Plan / Recommendation  History of Present Illness Pt is a 52 y/o female admitted s/p ORIF calcaneous fracture.   PT Comments   Pt states pain better controlled today.  Ambulated ~30' with RW & she states this would be adequate for household distances.     Follow Up Recommendations  Home health PT;Supervision for mobility/OOB     Does the patient have the potential to tolerate intense rehabilitation     Barriers to Discharge        Equipment Recommendations  Rolling walker with 5" wheels;3in1 (PT)    Recommendations for Other Services    Frequency Min 5X/week   Progress towards PT Goals Progress towards PT goals: Progressing toward goals  Plan Current plan remains appropriate    Precautions / Restrictions Precautions Precautions: Fall Restrictions Weight Bearing Restrictions: Yes LLE Weight Bearing: Non weight bearing   Pertinent Vitals/Pain "it's ok".      Mobility  Bed Mobility Bed Mobility: Not assessed Transfers Transfers: Sit to Stand;Stand to Sit Sit to Stand: 5: Supervision;With upper extremity assist;With armrests;From chair/3-in-1 Stand to Sit: 5: Supervision;With upper extremity assist;With armrests;To chair/3-in-1 Ambulation/Gait Ambulation/Gait Assistance: 4: Min guard Ambulation Distance (Feet): 30 Feet Assistive device: Rolling walker Ambulation/Gait Assistance Details: Pt steady & maintained NWBing Gait Pattern: Step-to pattern Gait velocity: Decreased Stairs: No Wheelchair Mobility Wheelchair Mobility: No      PT Goals (current goals can now be found in the care plan section) Acute Rehab PT Goals Patient Stated Goal: Go home w/ family assist PT Goal Formulation: With patient/family Time For Goal Achievement: 05/24/13 Potential to Achieve Goals:  Good  Visit Information  Last PT Received On: 05/19/13 Assistance Needed: +1 History of Present Illness: Pt is a 52 y/o female admitted s/p ORIF calcaneous fracture.    Subjective Data  Patient Stated Goal: Go home w/ family assist   Cognition  Cognition Arousal/Alertness: Awake/alert Behavior During Therapy: WFL for tasks assessed/performed Overall Cognitive Status: Within Functional Limits for tasks assessed    Balance     End of Session PT - End of Session Activity Tolerance: Patient tolerated treatment well Patient left: in chair;with call bell/phone within reach;with family/visitor present Nurse Communication: Mobility status   GP     Lara Mulch 05/19/2013, 11:34 AM   Verdell Face, PTA [phone] 05/19/2013

## 2013-05-19 NOTE — Progress Notes (Signed)
Orthopaedic Trauma Service Progress Note  Subjective  Doing great Ready to go home Pain controlled No new issues   Review of Systems  Constitutional: Negative for fever and chills.  Respiratory: Negative for shortness of breath and wheezing.   Cardiovascular: Negative for chest pain and palpitations.  Gastrointestinal: Negative for nausea, vomiting and abdominal pain.  Genitourinary: Negative for dysuria.  Neurological: Negative for tingling, sensory change and headaches.     Objective   BP 119/60  Pulse 106  Temp(Src) 99.1 F (37.3 C) (Oral)  Resp 18  SpO2 93%  Intake/Output     12/11 0701 - 12/12 0700 12/12 0701 - 12/13 0700   P.O. 120    I.V.     Total Intake 120     Urine     Drains     Total Output       Net +120          Urine Occurrence 2 x      Labs  Results for EVEA, SHEEK (MRN [id number]) as of 05/19/2013 08:35  Ref. Range 05/19/2013 06:35  Sodium Latest Range: 135-145 mEq/L 135  Potassium Latest Range: 3.5-5.1 mEq/L 4.1  Chloride Latest Range: 96-112 mEq/L 98  CO2 Latest Range: 19-32 mEq/L 29  BUN Latest Range: 6-23 mg/dL 6  Creatinine Latest Range: 0.50-1.10 mg/dL 4.09  Calcium Latest Range: 8.4-10.5 mg/dL 9.4  GFR calc non Af Amer Latest Range: >90 mL/min >90  GFR calc Af Amer Latest Range: >90 mL/min >90  Glucose Latest Range: 70-99 mg/dL 811 (H)  WBC Latest Range: 4.0-10.5 K/uL 8.7  RBC Latest Range: 3.87-5.11 MIL/uL 3.43 (L)  Hemoglobin Latest Range: 12.0-15.0 g/dL 91.4 (L)  HCT Latest Range: 36.0-46.0 % 31.4 (L)  MCV Latest Range: 78.0-100.0 fL 91.5  MCH Latest Range: 26.0-34.0 pg 30.9  MCHC Latest Range: 30.0-36.0 g/dL 78.2  RDW Latest Range: 11.5-15.5 % 13.6  Platelets Latest Range: 150-400 K/uL 389    Exam  Gen: awake and alert, NAD, comfortable Lungs: clear B  Cardiac: s1 and s2 Abd: + BS, NTND Ext:       Left Lower Extremity             Splint C/d/I             DPN, SPN, TN sensation grossly intact  Weak motor with toe flex and extension             Ext warm             + Swelling             No pain with passive stretch     Assessment and Plan   POD/HD#: 3  52 y/o black female s/p fall with L calcaneus fracture s/p ORIF   1. Comminuted L calcaneus fracture s/p ORIF:             POD 3             NWB x 8 weeks             Splint x 2 weeks              PT/OT               Ice and elevate above heart             HH eval                2. Medical issues  HTN- continue to monitor               Acute on chronic pain- see below             Anxiety- home meds             GERD- Home meds             Sleep apnea- no CPAP at home               Fibromyalgia- see below              3. Pain management:           continue with current PO regimen   4. ABL anemia/Hemodynamics             Stable H/H     5. DVT/PE prophylaxis:             Lovenox while inpt             Will dc on ASA 325 mg po BID x 8 weeks 6. ID:               Completed periop abx  7. Activity:             NWB L leg             PT/OT  8. FEN/Foley/Lines:             Advance diet as tolerated             dc lines   9.Ex-fix/Splint care:             Keep splint clean and dry               Do not remove splint  10. Dispo:             PT/OT                          dc home today   Follow up with ortho in 2-3 weeks  Discussed splint care with family and pt    Sherry Latin, PA-C [facility] [phone] (P) 05/19/2013 8:34 AM

## 2013-05-24 ENCOUNTER — Encounter: Payer: Self-pay | Admitting: Family Medicine

## 2013-05-24 ENCOUNTER — Ambulatory Visit (INDEPENDENT_AMBULATORY_CARE_PROVIDER_SITE_OTHER): Payer: Self-pay | Admitting: Family Medicine

## 2013-05-24 VITALS — BP 114/77 | HR 114 | Temp 98.5°F | Wt 177.0 lb

## 2013-05-24 DIAGNOSIS — I10 Essential (primary) hypertension: Secondary | ICD-10-CM

## 2013-05-24 DIAGNOSIS — F419 Anxiety disorder, unspecified: Secondary | ICD-10-CM

## 2013-05-24 DIAGNOSIS — S8290XS Unspecified fracture of unspecified lower leg, sequela: Secondary | ICD-10-CM

## 2013-05-24 DIAGNOSIS — F411 Generalized anxiety disorder: Secondary | ICD-10-CM

## 2013-05-24 DIAGNOSIS — S92002S Unspecified fracture of left calcaneus, sequela: Secondary | ICD-10-CM

## 2013-05-24 MED ORDER — LISINOPRIL-HYDROCHLOROTHIAZIDE 20-25 MG PO TABS: 1.0000 | ORAL_TABLET | Freq: Every day | ORAL | Status: DC

## 2013-05-24 MED ORDER — ALPRAZOLAM 1 MG PO TABS: 1.0000 mg | ORAL_TABLET | Freq: Two times a day (BID) | ORAL | Status: DC | PRN

## 2013-05-24 MED ORDER — ZOLPIDEM TARTRATE 10 MG PO TABS: 10.0000 mg | ORAL_TABLET | Freq: Every evening | ORAL | Status: DC | PRN

## 2013-05-24 NOTE — Assessment & Plan Note (Signed)
Refilled Xanax today. Once again encouraged to cope with anxiety and not use Xanax daily if not needed, and only use for severe symptoms.

## 2013-05-24 NOTE — Patient Instructions (Signed)
It was good to see you. Keep that foot elevated and take care of yourself!  Please make an appointment after March 27 to check your routine labs and check up. Let us know if you need anything before then.  Merry Christmas! Gorje Iyer M. Tecia Cinnamon, M.D.

## 2013-05-24 NOTE — Assessment & Plan Note (Signed)
Stable. Con't Lisinopril/HCT, refill sent. Labs in March 2015.

## 2013-05-24 NOTE — Assessment & Plan Note (Signed)
Doing well. F/u with ortho as scheduled on 12/22

## 2013-05-24 NOTE — Progress Notes (Signed)
Patient ID: Sherry [name], female   DOB: [birth year], 52 y.o.   MRN: [id number]    Subjective: HPI: Patient is a 52 y.o. female presenting to clinic today for follow up appointment. Concerns today include s/p foot surgery, anxiety and blood pressure  1. Hypertension Blood pressure today: 114/77 Taking Meds: Lisinopril/HCT and ASA 325mg  Side effects: None, needs refills ROS: Denies headache, visual changes, nausea, vomiting, chest pain,or shortness of breath.  2. Anxiety Has long standing anxiety and chronic pain. She is requesting refill on benzo which she has been on low dose, and using only when really needed. Discussed that I do not want this to be a crutch for her. She agrees but states with her surgery she has been more anxious than usual.  3. Foot surgery Had left calcaneus fracture with repair last week. She is still in a lot of pain, but overall doing well. Pain medication prescribed by ortho. She will f/u with them on Dec 22 for next appointment. No concerns today other than pain.  History Reviewed: Non smoker. Health Maintenance: Needs lipid panel in March 2015  ROS: Please see HPI above.  Objective: Office vital signs reviewed. BP 114/77  Pulse 114  Temp(Src) 98.5 F (36.9 C) (Oral)  Wt 177 lb (80.287 kg)  Physical Examination:  General: Awake, alert. NAD. In wheelchair HEENT: Atraumatic, normocephalic. MMM. Pulm: CTAB, no wheezes Cardio: RRR, no murmurs appreciated Abdomen:+BS, soft, nontender, nondistended Extremities: No edema. Left lower extremity in cast with ACE wrap to the knee. Able to wiggle toes. Extremity warm Neuro: Grossly intact  Assessment: 52 y.o. female follow up  Plan: See Problem List and After Visit Summary

## 2013-06-12 ENCOUNTER — Encounter: Payer: BC Managed Care – PPO | Admitting: Physical Medicine & Rehabilitation

## 2013-06-13 ENCOUNTER — Other Ambulatory Visit: Payer: Self-pay | Admitting: Family Medicine

## 2013-07-10 ENCOUNTER — Encounter
Payer: BC Managed Care – PPO | Attending: Physical Medicine & Rehabilitation | Admitting: Physical Medicine & Rehabilitation

## 2013-07-10 DIAGNOSIS — M47817 Spondylosis without myelopathy or radiculopathy, lumbosacral region: Secondary | ICD-10-CM | POA: Insufficient documentation

## 2013-07-10 DIAGNOSIS — M25539 Pain in unspecified wrist: Secondary | ICD-10-CM | POA: Insufficient documentation

## 2013-07-10 DIAGNOSIS — IMO0001 Reserved for inherently not codable concepts without codable children: Secondary | ICD-10-CM | POA: Insufficient documentation

## 2013-07-13 ENCOUNTER — Other Ambulatory Visit: Payer: Self-pay | Admitting: Family Medicine

## 2013-07-14 ENCOUNTER — Other Ambulatory Visit: Payer: Self-pay | Admitting: Family Medicine

## 2013-07-14 MED ORDER — PROMETHAZINE HCL 25 MG PO TABS: ORAL_TABLET | ORAL | Status: DC

## 2013-07-31 ENCOUNTER — Other Ambulatory Visit: Payer: Self-pay | Admitting: *Deleted

## 2013-07-31 MED ORDER — ESOMEPRAZOLE MAGNESIUM 20 MG PO CPDR: 20.0000 mg | DELAYED_RELEASE_CAPSULE | Freq: Every day | ORAL | Status: DC

## 2013-08-07 ENCOUNTER — Other Ambulatory Visit: Payer: Self-pay | Admitting: *Deleted

## 2013-08-07 MED ORDER — ESOMEPRAZOLE MAGNESIUM 20 MG PO CPDR: 20.0000 mg | DELAYED_RELEASE_CAPSULE | Freq: Every day | ORAL | Status: DC

## 2013-08-08 ENCOUNTER — Telehealth: Payer: Self-pay | Admitting: Family Medicine

## 2013-08-08 NOTE — Telephone Encounter (Signed)
Patient too early for refill. Last was given 12/17 with 2 refills. Will not fill before March 17.  She should come to office visit for written Rx.  Thank you, Mccrae Speciale M. Reynoldo Mainer, M.D.

## 2013-08-08 NOTE — Telephone Encounter (Signed)
Spoke with daughter but her phone was breaking up.  Please inform her (if she has the proper paperwork in our system) that Dr. Sheral Apley will refill at her appt, as she will need a hardcopy.  Sherry [name], Sherry [name]

## 2013-08-08 NOTE — Telephone Encounter (Signed)
Needs refill on Xanax [facility]

## 2013-08-17 ENCOUNTER — Ambulatory Visit: Payer: Self-pay | Admitting: Family Medicine

## 2013-08-18 ENCOUNTER — Other Ambulatory Visit: Payer: Self-pay | Admitting: Family Medicine

## 2013-08-22 ENCOUNTER — Other Ambulatory Visit: Payer: Self-pay | Admitting: *Deleted

## 2013-08-24 ENCOUNTER — Ambulatory Visit (INDEPENDENT_AMBULATORY_CARE_PROVIDER_SITE_OTHER): Payer: Self-pay | Admitting: Family Medicine

## 2013-08-24 ENCOUNTER — Encounter: Payer: Self-pay | Admitting: Family Medicine

## 2013-08-24 VITALS — BP 121/78 | HR 111 | Temp 98.7°F | Wt 175.0 lb

## 2013-08-24 DIAGNOSIS — S92009A Unspecified fracture of unspecified calcaneus, initial encounter for closed fracture: Secondary | ICD-10-CM

## 2013-08-24 DIAGNOSIS — F411 Generalized anxiety disorder: Secondary | ICD-10-CM

## 2013-08-24 DIAGNOSIS — F419 Anxiety disorder, unspecified: Secondary | ICD-10-CM

## 2013-08-24 DIAGNOSIS — E7800 Pure hypercholesterolemia, unspecified: Secondary | ICD-10-CM

## 2013-08-24 DIAGNOSIS — G479 Sleep disorder, unspecified: Secondary | ICD-10-CM

## 2013-08-24 DIAGNOSIS — S92002A Unspecified fracture of left calcaneus, initial encounter for closed fracture: Secondary | ICD-10-CM

## 2013-08-24 LAB — LIPID PANEL
CHOLESTEROL: 208 mg/dL — AB (ref 0–200)
HDL: 39 mg/dL — ABNORMAL LOW (ref >39)
LDL Cholesterol: 143 mg/dL — ABNORMAL HIGH (ref 0–99)
Total CHOL/HDL Ratio: 5.3 Ratio
Triglycerides: 132 mg/dL (ref <150)
VLDL: 26 mg/dL (ref 0–40)

## 2013-08-24 MED ORDER — CYCLOBENZAPRINE HCL 5 MG PO TABS: 5.0000 mg | ORAL_TABLET | Freq: Three times a day (TID) | ORAL | Status: DC | PRN

## 2013-08-24 MED ORDER — PROMETHAZINE HCL 25 MG PO TABS: ORAL_TABLET | ORAL | Status: DC

## 2013-08-24 MED ORDER — ALPRAZOLAM 1 MG PO TABS: 1.0000 mg | ORAL_TABLET | Freq: Two times a day (BID) | ORAL | Status: DC | PRN

## 2013-08-24 MED ORDER — ZOLPIDEM TARTRATE 10 MG PO TABS: 10.0000 mg | ORAL_TABLET | Freq: Every evening | ORAL | Status: DC | PRN

## 2013-08-24 MED ORDER — ESOMEPRAZOLE MAGNESIUM 40 MG PO CPDR: 40.0000 mg | DELAYED_RELEASE_CAPSULE | Freq: Every day | ORAL | Status: DC

## 2013-08-24 NOTE — Assessment & Plan Note (Signed)
Lipid panel today. Con't statin for now, but consider change or increase dose.

## 2013-08-24 NOTE — Assessment & Plan Note (Signed)
Refilled Ambien #30 with 2 refills.

## 2013-08-24 NOTE — Assessment & Plan Note (Signed)
Healing well. F/u with Dr. Marcelino Scot.

## 2013-08-24 NOTE — Assessment & Plan Note (Signed)
Refilled Xanax x3 months. No early refills.  Encouraged once again to find alternate ways to deal with her anxiety. Consider UDS at next visit.

## 2013-08-24 NOTE — Patient Instructions (Signed)
It was good to see you.  - Ask Dr. Marcelino Scot about the aspirin dose - Continue your medications - I have refilled your medications - We will check your lipid panel today  I will see you back in 3 months for a check up!   Alaynna Kerwood M. Kenlie Seki, M.D.

## 2013-08-24 NOTE — Progress Notes (Signed)
Patient ID: [name], female   DOB: Mar 25, 1961, 53 y.o.   MRN: [id number]    Subjective: HPI: Patient is a 53 y.o. female presenting to clinic today for follow up appointment for medication refills. Concerns today include continued heel pain  1. Anxiety- On Xanax. She states her last panic attack was yesterday, and she does not know why she gets the attacks. Daughter is a good source of strength and comfort. She ran out of Xanax and states she had not had any for 2 months, however she later stated she got refill in Feburary. She is using it prn which helps. No SI/HI, no VH/AH.  2. Sleep disorder- On Ambien. Requesting refills. Stable on current dose  3. Heel fracture- Follows up with Ortho (Dr. Marcelino Scot) on March 23. Overall doing better, states she is walking on the lateral aspect of her foot. She is in regular tennis shoes now, walking with a walker. Not in PT.  4. HLD- Due to lipid panel. On medication, endorses compliance. She states her appetite has changed since her heel surgery and she eats frequent small meals. She has not had any weight loss.   History Reviewed: Never smoker.  ROS: Please see HPI above.  Objective: Office vital signs reviewed. BP 121/78  Pulse 111  Temp(Src) 98.7 F (37.1 C) (Oral)  Wt 175 lb (79.379 kg)  Physical Examination:  General: Awake, alert. NAD HEENT: Atraumatic, normocephalic Neck: No masses palpated. No LAD Pulm: CTAB, no wheezes Cardio: RRR, no murmurs appreciated Abdomen:+BS, soft, nontender, nondistended Neuro: Grossly intact. Walking with a walker, slightly limp  Assessment: 53 y.o. female follow up  Plan: See Problem List and After Visit Summary

## 2013-09-06 ENCOUNTER — Ambulatory Visit: Payer: BC Managed Care – PPO | Attending: Orthopedic Surgery | Admitting: Physical Therapy

## 2013-09-06 DIAGNOSIS — R262 Difficulty in walking, not elsewhere classified: Secondary | ICD-10-CM | POA: Insufficient documentation

## 2013-09-06 DIAGNOSIS — M25673 Stiffness of unspecified ankle, not elsewhere classified: Secondary | ICD-10-CM | POA: Insufficient documentation

## 2013-09-06 DIAGNOSIS — IMO0001 Reserved for inherently not codable concepts without codable children: Secondary | ICD-10-CM | POA: Insufficient documentation

## 2013-09-06 DIAGNOSIS — M25676 Stiffness of unspecified foot, not elsewhere classified: Secondary | ICD-10-CM | POA: Diagnosis not present

## 2013-09-07 ENCOUNTER — Ambulatory Visit: Payer: BC Managed Care – PPO | Admitting: Rehabilitation

## 2013-09-07 DIAGNOSIS — IMO0001 Reserved for inherently not codable concepts without codable children: Secondary | ICD-10-CM | POA: Diagnosis not present

## 2013-09-11 ENCOUNTER — Ambulatory Visit: Payer: BC Managed Care – PPO | Admitting: Physical Therapy

## 2013-09-11 DIAGNOSIS — IMO0001 Reserved for inherently not codable concepts without codable children: Secondary | ICD-10-CM | POA: Diagnosis not present

## 2013-09-13 ENCOUNTER — Other Ambulatory Visit: Payer: Self-pay | Admitting: Family Medicine

## 2013-09-14 ENCOUNTER — Ambulatory Visit: Payer: BC Managed Care – PPO | Admitting: Rehabilitation

## 2013-09-14 DIAGNOSIS — IMO0001 Reserved for inherently not codable concepts without codable children: Secondary | ICD-10-CM | POA: Diagnosis not present

## 2013-09-18 ENCOUNTER — Encounter: Payer: BC Managed Care – PPO | Admitting: Physical Therapy

## 2013-09-20 ENCOUNTER — Encounter: Payer: BC Managed Care – PPO | Admitting: Physical Therapy

## 2013-09-21 ENCOUNTER — Ambulatory Visit: Payer: BC Managed Care – PPO | Admitting: Rehabilitation

## 2013-09-21 DIAGNOSIS — IMO0001 Reserved for inherently not codable concepts without codable children: Secondary | ICD-10-CM | POA: Diagnosis not present

## 2013-09-25 ENCOUNTER — Ambulatory Visit: Payer: BC Managed Care – PPO | Admitting: Rehabilitation

## 2013-09-27 ENCOUNTER — Ambulatory Visit: Payer: BC Managed Care – PPO | Admitting: Rehabilitation

## 2013-10-02 ENCOUNTER — Ambulatory Visit: Payer: BC Managed Care – PPO | Admitting: Rehabilitation

## 2013-10-03 ENCOUNTER — Encounter
Payer: BC Managed Care – PPO | Attending: Physical Medicine & Rehabilitation | Admitting: Physical Medicine & Rehabilitation

## 2013-10-03 ENCOUNTER — Encounter: Payer: Self-pay | Admitting: Physical Medicine & Rehabilitation

## 2013-10-03 VITALS — BP 121/73 | HR 95 | Resp 14 | Ht 60.0 in | Wt 173.0 lb

## 2013-10-03 DIAGNOSIS — S92002A Unspecified fracture of left calcaneus, initial encounter for closed fracture: Secondary | ICD-10-CM

## 2013-10-03 DIAGNOSIS — M47817 Spondylosis without myelopathy or radiculopathy, lumbosacral region: Secondary | ICD-10-CM | POA: Insufficient documentation

## 2013-10-03 DIAGNOSIS — S92009A Unspecified fracture of unspecified calcaneus, initial encounter for closed fracture: Secondary | ICD-10-CM | POA: Insufficient documentation

## 2013-10-03 DIAGNOSIS — M797 Fibromyalgia: Secondary | ICD-10-CM

## 2013-10-03 DIAGNOSIS — IMO0001 Reserved for inherently not codable concepts without codable children: Secondary | ICD-10-CM

## 2013-10-03 DIAGNOSIS — X58XXXA Exposure to other specified factors, initial encounter: Secondary | ICD-10-CM | POA: Insufficient documentation

## 2013-10-03 DIAGNOSIS — M47816 Spondylosis without myelopathy or radiculopathy, lumbar region: Secondary | ICD-10-CM

## 2013-10-03 MED ORDER — GABAPENTIN 100 MG PO CAPS: 100.0000 mg | ORAL_CAPSULE | Freq: Two times a day (BID) | ORAL | Status: DC

## 2013-10-03 MED ORDER — OXYCODONE HCL 5 MG PO TABS: 5.0000 mg | ORAL_TABLET | Freq: Four times a day (QID) | ORAL | Status: DC | PRN

## 2013-10-03 NOTE — Progress Notes (Signed)
Subjective:    Patient ID: Sherry [name], female    DOB: [birth year], 53 y.o.   MRN: [id number]  HPI  Sherry [name] is back regarding her pain. She has good recovery in her right calcaneus. She still finds it hard to walk barefoot and has pain while walking in her shoes although it's more tolerable. She has gone back to work Designer, fashion/clothing).  She is off narcotics currently. She is no longer taking the naproxen or gabapentin either.   On cold, rainy days she really struggles. She has tingling and pain in both legs due to her FMS.   She has become frustrated by the pain and wonders when it will end.      Pain Inventory Average Pain 6 Pain Right Now 7 My pain is constant and aching  In the last 24 hours, has pain interfered with the following? General activity 7 Relation with others 7 Enjoyment of life 10 What TIME of day is your pain at its worst? constant all day Sleep (in general) Fair  Pain is worse with: walking and standing Pain improves with: rest, heat/ice and medication Relief from Meds: 3  Mobility walk without assistance walk with assistance use a cane ability to climb steps?  yes do you drive?  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  yes x-rays  Physicians involved in your care Any changes since last visit?  no   Family History  Problem Relation Age of Onset  . Diabetes Mother   . Hypertension Mother   . Depression Mother   . Diabetes Sister   . Hypertension Father   . Cancer Father     Prostate  . Alcohol abuse Father   . Cancer Brother     Prostate   History   Social History  . Marital Status: Single    Spouse Name: N/A    Number of Children: N/A  . Years of Education: N/A   Occupational History  . Unemployed    Social History Main Topics  . Smoking status: Never Smoker   . Smokeless tobacco: Never  Used  . Alcohol Use: No  . Drug Use: No  . Sexual Activity: Not Currently    Birth Control/ Protection: None     Comment: last intercouse two months ago   Other Topics Concern  . None   Social History Narrative   Lives with   2 Children: Ages 75 and 29   4 Grandchildren: 2, 3, 4 and 67   Brother      Diet: "Barely eats". Does not exercise due to pain.      03/2011- Currently unemployed. 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 Unsure of last pap.   Unsure of last Td   Unsure of last flu shot   Past Surgical History  Procedure Laterality Date  . Cholecystectomy      2008  . Total abdominal hysterectomy      1986  . Wisdom tooth extraction    . Orif calcaneous fracture Left 05/16/2013    Procedure: OPEN REDUCTION INTERNAL FIXATION (ORIF) LEFT CALCANEOUS FRACTURE;  Surgeon: Rozanna Box, MD;  Location: [facility];  Service: Orthopedics;  Laterality: Left;   Past Medical History  Diagnosis Date  . Anxiety   .  Chronic pain   . Acid reflux disease   . Sleep apnea   . Fibromyalgia   . Hypertension   . Trichomonas contact, treated   . Depression   . Migraine     last one was 04/18/13  . History of kidney stones     passed  . 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 History of blood transfusion 1981    childbirth  . Anginal pain     last time 04/18/13 in afternoon; referred to [name]   BP 121/73  Pulse 95  Resp 14  Ht 5' (1.524 m)  Wt 173 lb (78.472 kg)  BMI 33.79 kg/m2  SpO2 99%  Opioid Risk Score:   Fall Risk Score: Moderate Fall Risk (6-13 points) (pt educated and given a brochure on fall risk previously)    Review of Systems  Constitutional: Positive for diaphoresis and appetite change.  Cardiovascular: Positive for leg swelling.  Gastrointestinal: Positive for nausea.  All other systems reviewed and are negative.      Objective:   Physical Exam  General: Alert and oriented x 3, No  apparent distress. Slightly overweight.  HEENT: Head is normocephalic, atraumatic, PERRLA, EOMI, sclera anicteric, oral mucosa pink and moist, dentition intact, ext ear canals clear,  Neck: Supple without JVD or lymphadenopathy  Heart: Reg rate and rhythm. No murmurs rubs or gallops  Chest: CTA bilaterally without wheezes, rales, or rhonchi; no distress  Abdomen: Soft, non-tender, non-distended, bowel sounds positive.  Extremities: No clubbing, cyanosis, or edema. Pulses are 1+ in both feet, both feet were cool however. No vascular changes.  Skin: Clean and intact without signs of breakdown  Neuro: sensation grossly intact but both legs are hypersensitive almost allodynic to touch. Strength functional in all 4.  Musculoskeletal: head forward posture. T  Legs hypersensitive. Can forward flex at waist to 80 degrees. Left foot/heel sore. Scarring noted medially. Has pain with palpation of the left achilles distally at it's insertion on the calcaneus. Was also painful to passively dorsiflex the ankle. Arch is fairly well preserved. She seems to have near arom in all planes in the foot. She walks with substantial antalgia on the left. She is wearing good shoes.  Psych: Pt's affect is pleasant and appropriate.   Assessment & Plan:   1. Chronic pain syndrome/ fibromyalgia although pain seems to center more in legs/feet.  2. Left wrist pain, subacute  3. Low back pain.  4. Comminuted left calcaneal fx after fall, mild achilles tendonitis.    Plan:  1. Left calcaneal fx per Dr. Marcelino Scot. Also needs to be aggressive with heel cord stretching.  2. Resume gabapentin for FMS pain. 100mg   Bid  3. Resume oxycodone for breakthrough pain, 5mg  q6 prn #90 4. Naproxen 500mg  bid for wrist pain--recommend resuming if ok with ortho 5. Might do well with a heel cushion for the left heel during ambulation 6. DHEA, Mg++, co-q 10 supplements are still options.  7. Aerobic exercise potentially down the line once regular  household activity is more tolerable  8.  Follow up in 1 month. 25 minutes of face to face patient care time were spent during this visit. All questions were encouraged and answered.

## 2013-10-03 NOTE — Patient Instructions (Signed)
CHECK WITH HANDY ABOUT RESUMING NAPROXEN  WORK ON ACHILLES STRETCHING ON THE LEFT. USE HEAT BEFORE AND SOME ICE AFTERWARDS

## 2013-10-04 ENCOUNTER — Encounter: Payer: BC Managed Care – PPO | Admitting: Rehabilitation

## 2013-10-09 ENCOUNTER — Ambulatory Visit: Payer: BC Managed Care – PPO | Attending: Orthopedic Surgery | Admitting: Rehabilitation

## 2013-10-09 DIAGNOSIS — M25673 Stiffness of unspecified ankle, not elsewhere classified: Secondary | ICD-10-CM | POA: Insufficient documentation

## 2013-10-09 DIAGNOSIS — Z5189 Encounter for other specified aftercare: Secondary | ICD-10-CM | POA: Insufficient documentation

## 2013-10-09 DIAGNOSIS — M25676 Stiffness of unspecified foot, not elsewhere classified: Secondary | ICD-10-CM | POA: Insufficient documentation

## 2013-10-09 DIAGNOSIS — R262 Difficulty in walking, not elsewhere classified: Secondary | ICD-10-CM | POA: Insufficient documentation

## 2013-10-10 ENCOUNTER — Ambulatory Visit: Payer: BC Managed Care – PPO | Admitting: Rehabilitation

## 2013-10-12 ENCOUNTER — Encounter: Payer: BC Managed Care – PPO | Admitting: Rehabilitation

## 2013-10-16 ENCOUNTER — Ambulatory Visit: Payer: BC Managed Care – PPO | Admitting: Rehabilitation

## 2013-10-16 ENCOUNTER — Other Ambulatory Visit: Payer: Self-pay | Admitting: Orthopedic Surgery

## 2013-10-16 DIAGNOSIS — M79672 Pain in left foot: Secondary | ICD-10-CM

## 2013-10-17 ENCOUNTER — Ambulatory Visit
Admission: RE | Admit: 2013-10-17 | Discharge: 2013-10-17 | Disposition: A | Discharge status: Home / Self Care | Payer: BC Managed Care – PPO | Source: Ambulatory Visit | Attending: Orthopedic Surgery | Admitting: Orthopedic Surgery

## 2013-10-17 DIAGNOSIS — M79672 Pain in left foot: Secondary | ICD-10-CM

## 2013-10-19 ENCOUNTER — Encounter: Payer: BC Managed Care – PPO | Admitting: Rehabilitation

## 2013-10-23 ENCOUNTER — Other Ambulatory Visit: Payer: Self-pay | Admitting: Family Medicine

## 2013-10-23 MED ORDER — OMEPRAZOLE 40 MG PO CPDR: 40.0000 mg | DELAYED_RELEASE_CAPSULE | Freq: Every day | ORAL | Status: DC

## 2013-10-24 ENCOUNTER — Encounter: Payer: BC Managed Care – PPO | Admitting: Rehabilitation

## 2013-10-25 ENCOUNTER — Ambulatory Visit: Payer: BC Managed Care – PPO | Admitting: Rehabilitation

## 2013-11-01 ENCOUNTER — Encounter: Payer: BC Managed Care – PPO | Admitting: Rehabilitation

## 2013-11-02 ENCOUNTER — Encounter: Payer: BC Managed Care – PPO | Admitting: Rehabilitation

## 2013-11-07 ENCOUNTER — Telehealth: Payer: Self-pay | Admitting: *Deleted

## 2013-11-07 NOTE — Telephone Encounter (Signed)
Received call from [facility] in Dr Carlean Jews office.  Sherry [name] is there in the office and asking for pain medication.  Sherry [name] is asking if we are prescribing for her.  Dr Naaman Plummer did prescribe percocet for Sherry [name] at her last 10/03/13 appt and she was to return in a month, but did not come in for that appt..  She has an appt 11/22/13 with Dr Naaman Plummer.  I instructed Gwen that the patient should follow up with our office to get her pain medication,  While in the operative/postoperative period the surgeon can prescribe the pain medications but since this is not the case at this time, she should follow up with Dr Naaman Plummer.

## 2013-11-08 ENCOUNTER — Ambulatory Visit: Payer: BC Managed Care – PPO | Admitting: Physical Therapy

## 2013-11-09 ENCOUNTER — Encounter: Payer: BC Managed Care – PPO | Admitting: Rehabilitation

## 2013-11-14 ENCOUNTER — Encounter
Payer: BC Managed Care – PPO | Attending: Physical Medicine & Rehabilitation | Admitting: Physical Medicine & Rehabilitation

## 2013-11-14 ENCOUNTER — Encounter: Payer: Self-pay | Admitting: Physical Medicine & Rehabilitation

## 2013-11-14 VITALS — BP 142/70 | HR 96 | Resp 14 | Ht 60.0 in | Wt 173.0 lb

## 2013-11-14 DIAGNOSIS — X58XXXA Exposure to other specified factors, initial encounter: Secondary | ICD-10-CM | POA: Insufficient documentation

## 2013-11-14 DIAGNOSIS — M47817 Spondylosis without myelopathy or radiculopathy, lumbosacral region: Secondary | ICD-10-CM | POA: Insufficient documentation

## 2013-11-14 DIAGNOSIS — M47816 Spondylosis without myelopathy or radiculopathy, lumbar region: Secondary | ICD-10-CM

## 2013-11-14 DIAGNOSIS — S92002A Unspecified fracture of left calcaneus, initial encounter for closed fracture: Secondary | ICD-10-CM

## 2013-11-14 DIAGNOSIS — S92009A Unspecified fracture of unspecified calcaneus, initial encounter for closed fracture: Secondary | ICD-10-CM | POA: Insufficient documentation

## 2013-11-14 DIAGNOSIS — Z5189 Encounter for other specified aftercare: Secondary | ICD-10-CM | POA: Insufficient documentation

## 2013-11-14 DIAGNOSIS — M25539 Pain in unspecified wrist: Secondary | ICD-10-CM

## 2013-11-14 DIAGNOSIS — IMO0001 Reserved for inherently not codable concepts without codable children: Secondary | ICD-10-CM | POA: Insufficient documentation

## 2013-11-14 DIAGNOSIS — M25532 Pain in left wrist: Secondary | ICD-10-CM

## 2013-11-14 DIAGNOSIS — M797 Fibromyalgia: Secondary | ICD-10-CM

## 2013-11-14 MED ORDER — CYCLOBENZAPRINE HCL 5 MG PO TABS: 5.0000 mg | ORAL_TABLET | Freq: Three times a day (TID) | ORAL | Status: DC | PRN

## 2013-11-14 MED ORDER — GABAPENTIN 300 MG PO CAPS: 300.0000 mg | ORAL_CAPSULE | Freq: Three times a day (TID) | ORAL | Status: DC

## 2013-11-14 MED ORDER — OXYCODONE HCL 5 MG PO TABS: 5.0000 mg | ORAL_TABLET | Freq: Four times a day (QID) | ORAL | Status: DC | PRN

## 2013-11-14 NOTE — Progress Notes (Signed)
Subjective:    Patient ID: Sherry [name], female    DOB: January 06, 1961, 53 y.o.   MRN: [id number]  HPI  Mrs. Bradham is back regarding her multiple pain issues. She has to go back into surgery for screws which have moved. She is no longer in the boot.   For pain she is using gabapentin, oxycodone, and relafen currently.  She is also on flexeril .   She is still working as a Barrister's clerk.. She develops swelling while she works.      Pain Inventory Average Pain 10 Pain Right Now 7 My pain is burning and aching  In the last 24 hours, has pain interfered with the following? General activity 10 Relation with others 10 Enjoyment of life 10 What TIME of day is your pain at its worst? constant Sleep (in general) Fair  Pain is worse with: walking and standing Pain improves with: rest, heat/ice and medication Relief from Meds: 9  Mobility walk with assistance use a cane ability to climb steps?  yes do you drive?  yes  Function employed # of hrs/week 53 Clerk  Neuro/Psych trouble walking spasms depression anxiety  Prior Studies CT/MRI  Physicians involved in your care Dr Marcelino Scot   Family History  Problem Relation Age of Onset  . Diabetes Mother   . Hypertension Mother   . Depression Mother   . Diabetes Sister   . Hypertension Father   . Cancer Father     Prostate  . Alcohol abuse Father   . Cancer Brother     Prostate   History   Social History  . Marital Status: Single    Spouse Name: N/A    Number of Children: N/A  . Years of Education: N/A   Occupational History  . Unemployed    Social History Main Topics  . Smoking status: Never Smoker   . Smokeless tobacco: Never Used  . Alcohol Use: No  . Drug Use: No  . Sexual Activity: Not Currently    Birth Control/ Protection: None     Comment: last intercouse two months ago   Other Topics Concern  . None   Social History Narrative   Lives with   2 Children: Ages 67 and 33   4 Grandchildren: 2,  3, 4 and 24   Brother      Diet: "Barely eats". Does not exercise due to pain.      03/2011- Currently unemployed. 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 Unsure of last pap.   Unsure of last Td   Unsure of last flu shot   Past Surgical History  Procedure Laterality Date  . Cholecystectomy      2008  . Total abdominal hysterectomy      1986  . Wisdom tooth extraction    . Orif calcaneous fracture Left 05/16/2013    Procedure: OPEN REDUCTION INTERNAL FIXATION (ORIF) LEFT CALCANEOUS FRACTURE;  Surgeon: Rozanna Box, MD;  Location: [facility];  Service: Orthopedics;  Laterality: Left;   Past Medical History  Diagnosis Date  . Anxiety   . Chronic pain   . Acid reflux disease   . Sleep apnea   . Fibromyalgia   . Hypertension   . Trichomonas contact, treated   . Depression   . Migraine     last one was 04/18/13  . History of kidney stones     passed  .  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 History of blood transfusion 1981    childbirth  . Anginal pain     last time 04/18/13 in afternoon; referred to [name]   BP 142/70  Pulse 96  Resp 14  Ht 5' (1.524 m)  Wt 173 lb (78.472 kg)  BMI 33.79 kg/m2  SpO2 98%  Opioid Risk Score:   Fall Risk Score: High Fall Risk (>13 points) (patient educated handout declined)   Review of Systems  Constitutional: Positive for diaphoresis.  Gastrointestinal: Positive for nausea.  Musculoskeletal: Positive for gait problem.  Neurological: Positive for weakness.       Spasms  Psychiatric/Behavioral: Positive for dysphoric mood. The patient is nervous/anxious.   All other systems reviewed and are negative.      Objective:   Physical Exam General: Alert and oriented x 3, No apparent distress. Slightly overweight.  HEENT: Head is normocephalic, atraumatic, PERRLA, EOMI, sclera anicteric, oral mucosa pink and moist, dentition intact, ext ear canals clear,  Neck: Supple  without JVD or lymphadenopathy  Heart: Reg rate and rhythm. No murmurs rubs or gallops  Chest: CTA bilaterally without wheezes, rales, or rhonchi; no distress  Abdomen: Soft, non-tender, non-distended, bowel sounds positive.  Extremities: No clubbing, cyanosis, or edema. Pulses are 1+ in both feet, both feet were cool however. No vascular changes.  Skin: Clean and intact without signs of breakdown  Neuro: sensation grossly intact but both legs are hypersensitive almost allodynic to touch. Strength functional in all 4.  Musculoskeletal: head forward posture.  Can forward flex at waist to 80 degrees. Left foot/heel sore--some swelling seen around ankle joint.. Scarring noted medially. Achilles more flexible today. Was also painful to passively dorsiflex the ankle. Arch is fairly well preserved. She seems to have near arom in all planes in the foot. She walks with substantial antalgia on the left still. She is wearing good shoes.  Psych: Pt's affect is pleasant and appropriate.  Assessment & Plan:   1. Chronic pain syndrome/ fibromyalgia although pain seems to center more in legs/feet.  2. Left wrist pain, subacute  3. Low back pain.  4. Comminuted left calcaneal fx after fall, hardware failure.    Plan:  1. Left calcaneal fx per Dr. Marcelino Scot. Future surgery per Ortho to revise/remove hardware  2. Will increase gabapentin for fibromyalgia to 300mg  capsule  3. maintain oxycodone for breakthrough pain, 5mg  q6 prn #90  4. Recommended knee high compression stocking to help control edema.  5. Suggested trial of ASO 6. May want to use walking boot as well to help control edema    8. Follow up in 1 month with NP. 25 minutes of face to face patient care time were spent during this visit. All questions were encouraged and answered.

## 2013-11-14 NOTE — Patient Instructions (Signed)
TRY KNEE HIGH COMPRESSION STOCKINGS TO HELP CONTROL YOUR SWELLING.    USE YOUR WALKING BOOT OCCASIONALLY DURING THE DAY TO GIVE YOU SOME RELIEF. IF IT HELPS WEAR IT---IF IT DOESN'T, DON'T WEAR IT.

## 2013-11-17 ENCOUNTER — Telehealth: Payer: Self-pay

## 2013-11-17 NOTE — Telephone Encounter (Signed)
Patient states the pharmacy does not have her Gabapentin and Flexeril RX that Dr. Naaman Plummer prescribed on 6/9. Both scripts were sent to [facility]. Attempted to contact patient. Left a voicemail to inform her.

## 2013-11-20 ENCOUNTER — Ambulatory Visit: Payer: BC Managed Care – PPO | Admitting: Family Medicine

## 2013-11-20 ENCOUNTER — Telehealth: Payer: Self-pay | Admitting: Family Medicine

## 2013-11-20 MED ORDER — PANTOPRAZOLE SODIUM 40 MG PO TBEC: 40.0000 mg | DELAYED_RELEASE_TABLET | Freq: Every day | ORAL | Status: DC

## 2013-11-20 NOTE — Telephone Encounter (Signed)
Needs refill on xanax-[facility] Would like something cheaper that nexium-[facility]. Not in MAP program any more She will run out before her appt on Thurs

## 2013-11-20 NOTE — Telephone Encounter (Signed)
LM for patient to call back.  Please inform of message from MD.  Thanks Johnney Ou

## 2013-11-20 NOTE — Telephone Encounter (Signed)
Xanax and Ambien will not be filled outside of an office visit. These are controlled substances, and they were last prescribed on 08/24/13. (Not time for refills.)  There are other options for PPI based on her drug coverage. [facility] Protonix, so I will send that in for her.  I will see her on Thursday.  Amber M. Hairford, M.D.

## 2013-11-22 ENCOUNTER — Ambulatory Visit: Payer: BC Managed Care – PPO | Admitting: Physical Medicine & Rehabilitation

## 2013-11-22 ENCOUNTER — Other Ambulatory Visit: Payer: Self-pay | Admitting: Family Medicine

## 2013-11-23 ENCOUNTER — Ambulatory Visit (INDEPENDENT_AMBULATORY_CARE_PROVIDER_SITE_OTHER): Payer: BC Managed Care – PPO | Admitting: Family Medicine

## 2013-11-23 ENCOUNTER — Encounter: Payer: Self-pay | Admitting: Family Medicine

## 2013-11-23 VITALS — BP 151/94 | HR 90 | Ht 60.0 in | Wt 174.0 lb

## 2013-11-23 DIAGNOSIS — M797 Fibromyalgia: Secondary | ICD-10-CM

## 2013-11-23 DIAGNOSIS — I10 Essential (primary) hypertension: Secondary | ICD-10-CM

## 2013-11-23 DIAGNOSIS — IMO0001 Reserved for inherently not codable concepts without codable children: Secondary | ICD-10-CM

## 2013-11-23 MED ORDER — LISINOPRIL-HYDROCHLOROTHIAZIDE 20-25 MG PO TABS: 1.0000 | ORAL_TABLET | Freq: Every day | ORAL | Status: DC

## 2013-11-23 MED ORDER — ZOLPIDEM TARTRATE 10 MG PO TABS: 10.0000 mg | ORAL_TABLET | Freq: Every evening | ORAL | Status: DC | PRN

## 2013-11-23 MED ORDER — ALPRAZOLAM 1 MG PO TABS: 1.0000 mg | ORAL_TABLET | Freq: Two times a day (BID) | ORAL | Status: DC | PRN

## 2013-11-23 MED ORDER — SIMVASTATIN 40 MG PO TABS: 40.0000 mg | ORAL_TABLET | Freq: Every day | ORAL | Status: DC

## 2013-11-23 MED ORDER — PROMETHAZINE HCL 25 MG PO TABS: ORAL_TABLET | ORAL | Status: DC

## 2013-11-23 NOTE — Assessment & Plan Note (Signed)
A: Currently in flare and socially stressed.  P: - Con't current medications.  - Xanax and Ambien refilled

## 2013-11-23 NOTE — Progress Notes (Signed)
Patient ID: Sherry [name], female   DOB: [birth year], 53 y.o.   MRN: [id number]    Subjective: HPI: Patient is a 53 y.o. female presenting to clinic today for follow up appointment. Concerns today include HTN  1. Leg pain- Fibromyalgia is flaring and her heel is also hurting her. Dr. Marcelino Scot planning on redoing the surgery to remove screws. Out of boot and doing ok. Needs to get a cane for stability. She has to wear shoes for balance. Pain medication prescribed by pain clinic.  2. Hypertension Blood pressure at home: Does not check Blood pressure today: 151/94 Taking Meds: Missed a few doses due to stressors (son was killed last week) Side effects: None ROS: Denies headache, visual changes, nausea, vomiting, chest pain, abdominal pain or shortness of breath.   3. Medication refills Needs refills on multiple medications including Xanax and Ambien.  History Reviewed: Never smoker. Health Maintenance: needs mammo and colonoscopy  ROS: Please see HPI above.  Objective: Office vital signs reviewed. BP 151/94  Pulse 90  Ht 5' (1.524 m)  Wt 174 lb (78.926 kg)  BMI 33.98 kg/m2  Physical Examination:  General: Awake, alert. NAD HEENT: Atraumatic, normocephalic Neck: No masses palpated. No LAD Pulm: CTAB, no wheezes Cardio: RRR, no murmurs appreciated Abdomen:+BS, soft, nontender, nondistended Extremities: No edema, scar on left lateral foot well healed Neuro: Walks with a limp  Assessment: 53 y.o. female follow up  Plan: See Problem List and After Visit Summary

## 2013-11-23 NOTE — Assessment & Plan Note (Signed)
A: BP above goal  But previously well controlled. Multiple stressors as well as pain.  P: - Con't current regimen - F/u in 3 months

## 2013-11-23 NOTE — Patient Instructions (Signed)
It was good to see you today. I am sorry you have so much going on. It has been a pleasure being your doctor.  Take care! Amber M. Hairford, M.D.

## 2013-12-14 ENCOUNTER — Encounter: Payer: BC Managed Care – PPO | Attending: Physical Medicine & Rehabilitation | Admitting: Registered Nurse

## 2013-12-14 ENCOUNTER — Encounter: Payer: Self-pay | Admitting: Registered Nurse

## 2013-12-14 VITALS — BP 120/76 | HR 115 | Resp 14 | Wt 177.0 lb

## 2013-12-14 DIAGNOSIS — IMO0001 Reserved for inherently not codable concepts without codable children: Secondary | ICD-10-CM | POA: Insufficient documentation

## 2013-12-14 DIAGNOSIS — M25532 Pain in left wrist: Secondary | ICD-10-CM

## 2013-12-14 DIAGNOSIS — S92009A Unspecified fracture of unspecified calcaneus, initial encounter for closed fracture: Secondary | ICD-10-CM

## 2013-12-14 DIAGNOSIS — F329 Major depressive disorder, single episode, unspecified: Secondary | ICD-10-CM

## 2013-12-14 DIAGNOSIS — X58XXXA Exposure to other specified factors, initial encounter: Secondary | ICD-10-CM | POA: Insufficient documentation

## 2013-12-14 DIAGNOSIS — M25539 Pain in unspecified wrist: Secondary | ICD-10-CM

## 2013-12-14 DIAGNOSIS — Z5181 Encounter for therapeutic drug level monitoring: Secondary | ICD-10-CM

## 2013-12-14 DIAGNOSIS — M47816 Spondylosis without myelopathy or radiculopathy, lumbar region: Secondary | ICD-10-CM

## 2013-12-14 DIAGNOSIS — M797 Fibromyalgia: Secondary | ICD-10-CM

## 2013-12-14 DIAGNOSIS — F32A Depression, unspecified: Secondary | ICD-10-CM

## 2013-12-14 DIAGNOSIS — S92002A Unspecified fracture of left calcaneus, initial encounter for closed fracture: Secondary | ICD-10-CM

## 2013-12-14 DIAGNOSIS — F4323 Adjustment disorder with mixed anxiety and depressed mood: Secondary | ICD-10-CM

## 2013-12-14 DIAGNOSIS — F3289 Other specified depressive episodes: Secondary | ICD-10-CM

## 2013-12-14 DIAGNOSIS — M47817 Spondylosis without myelopathy or radiculopathy, lumbosacral region: Secondary | ICD-10-CM | POA: Insufficient documentation

## 2013-12-14 DIAGNOSIS — Z79899 Other long term (current) drug therapy: Secondary | ICD-10-CM

## 2013-12-14 MED ORDER — GABAPENTIN 300 MG PO CAPS: 300.0000 mg | ORAL_CAPSULE | Freq: Three times a day (TID) | ORAL | Status: DC

## 2013-12-14 MED ORDER — OXYCODONE HCL 5 MG PO TABS: 5.0000 mg | ORAL_TABLET | Freq: Four times a day (QID) | ORAL | Status: DC | PRN

## 2013-12-14 NOTE — Progress Notes (Signed)
Subjective:    Patient ID: Sherry [name], female    DOB: 04-Jan-1961, 53 y.o.   MRN: [id number]  HPI: [name] is a 53 year old female who returns for follow up for chronic pain and medication refill. She says her pain is located in her left foot. She also states she is having generalized pain all over.She rates her pain 6. Her current exercise regime is walking at work. Encouraged her to start chair exercises, these were demonstrated. She verbalizes understanding. 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 She has been having excruciating pain in her left foot for over week. She knows the long hours are impacting her pain level. We will increase the tablets this visit. She verbalizes understanding. She admits to feeling depressed her son was killed last month on the [place]. She has been talking to her Doristine Bosworth and his wife. I offered to set up counseling she denied at this time. She was given a pamphlet to [facility]. Emotional support was given. Pain Inventory Average Pain 9 Pain Right Now 6 My pain is constant and aching  In the last 24 hours, has pain interfered with the following? General activity 10 Relation with others 10 Enjoyment of life 10 What TIME of day is your pain at its worst? constant all day Sleep (in general) Fair  Pain is worse with: walking, inactivity and standing Pain improves with: rest and medication Relief from Meds: 5  Mobility walk without assistance use a cane ability to climb steps?  yes do you drive?  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  no  Physicians involved in your care Any changes since last visit?  no   Family History  Problem Relation Age of Onset  . Diabetes Mother     . Hypertension Mother   . Depression Mother   . Diabetes Sister   . Hypertension Father   . Cancer Father     Prostate  . Alcohol abuse Father   . Cancer Brother     Prostate   History   Social History  . Marital Status: Single    Spouse Name: N/A    Number of Children: N/A  . Years of Education: N/A   Occupational History  . Unemployed    Social History Main Topics  . Smoking status: Never Smoker   . Smokeless tobacco: Never Used  . Alcohol Use: No  . Drug Use: No  . Sexual Activity: Not Currently    Birth Control/ Protection: None     Comment: last intercouse two months ago   Other Topics Concern  . None   Social History Narrative   Lives with   2 Children: Ages 52 and 24   4 Grandchildren: 2, 3, 4 and 42   Brother      Diet: "Barely eats". Does not exercise due to pain.      03/2011- Currently unemployed. 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 Unsure of last pap.   Unsure of last Td   Unsure of last flu shot   Past Surgical History  Procedure Laterality Date  . Cholecystectomy  2008  . Total abdominal hysterectomy      1986  . Wisdom tooth extraction    . Orif calcaneous fracture Left 05/16/2013    Procedure: OPEN REDUCTION INTERNAL FIXATION (ORIF) LEFT CALCANEOUS FRACTURE;  Surgeon: Rozanna Box, MD;  Location: [facility];  Service: Orthopedics;  Laterality: Left;   Past Medical History  Diagnosis Date  . Anxiety   . Chronic pain   . Acid reflux disease   . Sleep apnea   . Fibromyalgia   . Hypertension   . Trichomonas contact, treated   . Depression   . Migraine     last one was 04/18/13  . History of kidney stones     passed  . 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 History of blood transfusion 1981    childbirth  . Anginal pain     last time 04/18/13 in afternoon; referred to [name]   BP 120/76  Pulse 115  Resp 14  Wt 177 lb (80.287 kg)  SpO2 96%  Opioid Risk  Score:   Fall Risk Score:     Review of Systems  Constitutional: Positive for fever, chills and diaphoresis.  Cardiovascular: Positive for leg swelling.  Gastrointestinal: Positive for nausea and abdominal pain.  Neurological:       Spasms  Psychiatric/Behavioral: The patient is nervous/anxious.        Depression  All other systems reviewed and are negative.      Objective:   Physical Exam  Nursing note and vitals reviewed. Constitutional: She is oriented to person, place, and time. She appears well-developed and well-nourished.  HENT:  Head: Normocephalic and atraumatic.  Neck: Normal range of motion. Neck supple.  Cardiovascular: Normal rate, regular rhythm and normal heart sounds.   Pulmonary/Chest: Effort normal and breath sounds normal.  Musculoskeletal:  Normal Muscle Bulk and Muscle testing Reveals: Upper Extremities Full ROM and Muscle strength 5/5 Spinal Forward Flexion: 90 Degrees and extension 30 Degrees Hypersensitivity to Thoracic and Lumbar Lower Extremities: Full ROM and Muscle Strength 5/5 Left Leg Flexion Produces Pain into Patella and Foot. Arises from chair with ease Antalgic Gait.  Neurological: She is alert and oriented to person, place, and time.  Skin: Skin is warm and dry.  Psychiatric: She has a normal mood and affect.          Assessment & Plan:  1. Chronic pain syndrome/ Fibromyalgia: Continue Gabapentin Refilled:  Oxycodone 5mg  IR one tablet every 6 hours as needed. Tablets increased to 100. Continue with Heat Therapy. 2. Low back pain. Continue Current Medication Regime, Use Heat and Medication Regimen. 4. Comminuted left calcaneal fx after fall, hardware failure: Continue Medication Regime. Continue with  to Monitor  30 minutes of face to face patient care time was spent during this visit. All questions were encouraged and answered.  F/U in 1 month

## 2014-01-10 ENCOUNTER — Encounter: Payer: BC Managed Care – PPO | Attending: Physical Medicine & Rehabilitation | Admitting: Registered Nurse

## 2014-01-10 ENCOUNTER — Encounter: Payer: Self-pay | Admitting: Registered Nurse

## 2014-01-10 VITALS — BP 131/77 | HR 105 | Resp 14 | Ht 60.0 in | Wt 177.0 lb

## 2014-01-10 DIAGNOSIS — IMO0001 Reserved for inherently not codable concepts without codable children: Secondary | ICD-10-CM | POA: Diagnosis not present

## 2014-01-10 DIAGNOSIS — X58XXXA Exposure to other specified factors, initial encounter: Secondary | ICD-10-CM | POA: Insufficient documentation

## 2014-01-10 DIAGNOSIS — M47816 Spondylosis without myelopathy or radiculopathy, lumbar region: Secondary | ICD-10-CM

## 2014-01-10 DIAGNOSIS — F4323 Adjustment disorder with mixed anxiety and depressed mood: Secondary | ICD-10-CM | POA: Diagnosis present

## 2014-01-10 DIAGNOSIS — Z79899 Other long term (current) drug therapy: Secondary | ICD-10-CM | POA: Diagnosis present

## 2014-01-10 DIAGNOSIS — M47817 Spondylosis without myelopathy or radiculopathy, lumbosacral region: Secondary | ICD-10-CM | POA: Diagnosis not present

## 2014-01-10 DIAGNOSIS — M25539 Pain in unspecified wrist: Secondary | ICD-10-CM | POA: Diagnosis not present

## 2014-01-10 DIAGNOSIS — F329 Major depressive disorder, single episode, unspecified: Secondary | ICD-10-CM

## 2014-01-10 DIAGNOSIS — F3289 Other specified depressive episodes: Secondary | ICD-10-CM

## 2014-01-10 DIAGNOSIS — M797 Fibromyalgia: Secondary | ICD-10-CM

## 2014-01-10 DIAGNOSIS — M25532 Pain in left wrist: Secondary | ICD-10-CM

## 2014-01-10 DIAGNOSIS — Z5181 Encounter for therapeutic drug level monitoring: Secondary | ICD-10-CM | POA: Diagnosis present

## 2014-01-10 MED ORDER — OXYCODONE HCL 5 MG PO TABS: 5.0000 mg | ORAL_TABLET | Freq: Four times a day (QID) | ORAL | Status: DC | PRN

## 2014-01-10 NOTE — Progress Notes (Signed)
Subjective:    Patient ID: Sherry [name], female    DOB: 03-03-1961, 53 y.o.   MRN: [id number]  HPI: [name] is a 53 year old female who returns for follow up for chronic pain and medication refill. She says her pain is located in her left foot, and back. She rates her pain 7. Her current exercise regime is walking and performing stretching exercises.  Pain Inventory Average Pain 7 Pain Right Now 7 My pain is aching  In the last 24 hours, has pain interfered with the following? General activity 8 Relation with others 8 Enjoyment of life 10 What TIME of day is your pain at its worst? constant all day Sleep (in general) Fair  Pain is worse with: walking and standing Pain improves with: rest, heat/ice and medication Relief from Meds: 5  Mobility walk with assistance use a cane ability to climb steps?  yes do you drive?  yes use a wheelchair Do you have any goals in this area?  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  yes  Neuro/Psych numbness trouble walking anxiety  Prior Studies Any changes since last visit?  no  Physicians involved in your care Any changes since last visit?  no   Family History  Problem Relation Age of Onset  . Diabetes Mother   . Hypertension Mother   . Depression Mother   . Diabetes Sister   . Hypertension Father   . Cancer Father     Prostate  . Alcohol abuse Father   . Cancer Brother     Prostate   History   Social History  . Marital Status: Single    Spouse Name: N/A    Number of Children: N/A  . Years of Education: N/A   Occupational History  . Unemployed    Social History Main Topics  . Smoking status: Never Smoker   . Smokeless tobacco: Never Used  . Alcohol Use: No  . Drug Use: No  . Sexual Activity: Not Currently    Birth Control/ Protection: None     Comment: last intercouse two months ago   Other Topics Concern  . None    Social History Narrative   Lives with   2 Children: Ages 70 and 47   4 Grandchildren: 2, 3, 4 and 30   Brother      Diet: "Barely eats". Does not exercise due to pain.      03/2011- Currently unemployed. 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 Unsure of last pap.   Unsure of last Td   Unsure of last flu shot   Past Surgical History  Procedure Laterality Date  . Cholecystectomy      2008  . Total abdominal hysterectomy      1986  . Wisdom tooth extraction    . Orif calcaneous fracture Left 05/16/2013    Procedure: OPEN REDUCTION INTERNAL FIXATION (ORIF) LEFT CALCANEOUS FRACTURE;  Surgeon: Rozanna Box, MD;  Location: [facility];  Service: Orthopedics;  Laterality: Left;   Past Medical History  Diagnosis Date  . Anxiety   . Chronic pain   . Acid reflux disease   . Sleep apnea   . Fibromyalgia   . Hypertension   . Trichomonas contact, treated   . Depression   . Migraine  last one was 04/18/13  . History of kidney stones     passed  . 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 History of blood transfusion 1981    childbirth  . Anginal pain     last time 04/18/13 in afternoon; referred to [name]   BP 131/77  Pulse 105  Resp 14  Ht 5' (1.524 m)  Wt 177 lb (80.287 kg)  BMI 34.57 kg/m2  SpO2 95%  Opioid Risk Score:   Fall Risk Score: Low Fall Risk (0-5 points) (pt educated on fall risk, brochure given to pt previously)    Review of Systems  Musculoskeletal: Positive for gait problem.  Neurological: Positive for numbness.  Psychiatric/Behavioral: The patient is nervous/anxious.   All other systems reviewed and are negative.      Objective:   Physical Exam  Nursing note and vitals reviewed. Constitutional: She is oriented to person, place, and time. She appears well-developed and well-nourished.  HENT:  Head: Normocephalic and atraumatic.  Neck: Normal range of motion. Neck supple.   Cardiovascular: Normal rate and regular rhythm.   Pulmonary/Chest: Effort normal and breath sounds normal.  Musculoskeletal:  Normal Muscle Bulk and Muscle Testing Reveals: Upper Extremities: Full ROM and Muscle Strength 5/5 Spinal Forward Flexion 60 Degrees and Extension 10 Degrees Thoracic and Lumbar Hypersensitivity Lower Extremities: Full ROM and Muscle Strength 5/5 Arises from chair with ease Narrow Based gait  Neurological: She is alert and oriented to person, place, and time.  Skin: Skin is warm and dry.  Psychiatric: She has a normal mood and affect.          Assessment & Plan:  1. Chronic pain syndrome/ Fibromyalgia: Continue Gabapentin  Refilled: Oxycodone 5mg  IR one tablet every 6 hours as needed # 100. Continue with Heat Therapy.  2. Low back pain. Continue Current Medication Regime, Use Heat and Medication Regimen.  4. Comminuted left calcaneal fx after fall, hardware failure: F/U with Orthopedist Dr. Marcelino Scot. Continue Medication Regime. Continue with to Monitor   20 minutes of face to face patient care time was spent during this visit. All questions were encouraged and answered.  F/U in 1 month

## 2014-01-18 ENCOUNTER — Other Ambulatory Visit: Payer: Self-pay | Admitting: *Deleted

## 2014-01-18 MED ORDER — CYCLOBENZAPRINE HCL 5 MG PO TABS: 5.0000 mg | ORAL_TABLET | Freq: Three times a day (TID) | ORAL | Status: DC | PRN

## 2014-01-18 MED ORDER — PROMETHAZINE HCL 25 MG PO TABS: ORAL_TABLET | ORAL | Status: DC

## 2014-02-08 NOTE — Progress Notes (Unsigned)
This encounter was created in error - please disregard.

## 2014-02-22 ENCOUNTER — Telehealth: Payer: Self-pay

## 2014-02-22 ENCOUNTER — Encounter: Payer: BC Managed Care – PPO | Admitting: Registered Nurse

## 2014-02-22 DIAGNOSIS — M797 Fibromyalgia: Secondary | ICD-10-CM

## 2014-02-22 DIAGNOSIS — M47816 Spondylosis without myelopathy or radiculopathy, lumbar region: Secondary | ICD-10-CM

## 2014-02-22 NOTE — Telephone Encounter (Signed)
Patient is requesting a refill on Oxycodone. She had a appt on 9/17 that she rescheduled to 10/7. Please advise.

## 2014-02-23 ENCOUNTER — Ambulatory Visit (INDEPENDENT_AMBULATORY_CARE_PROVIDER_SITE_OTHER): Payer: BC Managed Care – PPO | Admitting: Obstetrics and Gynecology

## 2014-02-23 ENCOUNTER — Encounter: Payer: Self-pay | Admitting: Obstetrics and Gynecology

## 2014-02-23 VITALS — BP 124/70 | HR 84 | Temp 98.6°F | Ht 60.0 in | Wt 173.0 lb

## 2014-02-23 DIAGNOSIS — Z23 Encounter for immunization: Secondary | ICD-10-CM

## 2014-02-23 DIAGNOSIS — K219 Gastro-esophageal reflux disease without esophagitis: Secondary | ICD-10-CM | POA: Diagnosis not present

## 2014-02-23 DIAGNOSIS — S92002S Unspecified fracture of left calcaneus, sequela: Secondary | ICD-10-CM

## 2014-02-23 DIAGNOSIS — I10 Essential (primary) hypertension: Secondary | ICD-10-CM

## 2014-02-23 DIAGNOSIS — S8290XS Unspecified fracture of unspecified lower leg, sequela: Secondary | ICD-10-CM

## 2014-02-23 LAB — BASIC METABOLIC PANEL
BUN: 10 mg/dL (ref 6–23)
CO2: 25 mEq/L (ref 19–32)
CREATININE: 0.66 mg/dL (ref 0.50–1.10)
Calcium: 9.3 mg/dL (ref 8.4–10.5)
Chloride: 103 mEq/L (ref 96–112)
GLUCOSE: 155 mg/dL — AB (ref 70–99)
POTASSIUM: 4 meq/L (ref 3.5–5.3)
Sodium: 136 mEq/L (ref 135–145)

## 2014-02-23 MED ORDER — SIMVASTATIN 40 MG PO TABS: 40.0000 mg | ORAL_TABLET | Freq: Every day | ORAL | Status: DC

## 2014-02-23 MED ORDER — CYCLOBENZAPRINE HCL 5 MG PO TABS: 5.0000 mg | ORAL_TABLET | Freq: Three times a day (TID) | ORAL | Status: DC | PRN

## 2014-02-23 MED ORDER — LISINOPRIL-HYDROCHLOROTHIAZIDE 20-25 MG PO TABS: 1.0000 | ORAL_TABLET | Freq: Every day | ORAL | Status: DC

## 2014-02-23 MED ORDER — ALPRAZOLAM 1 MG PO TABS: 1.0000 mg | ORAL_TABLET | Freq: Two times a day (BID) | ORAL | Status: DC | PRN

## 2014-02-23 MED ORDER — OXYCODONE HCL 5 MG PO TABS: 5.0000 mg | ORAL_TABLET | Freq: Four times a day (QID) | ORAL | Status: DC | PRN

## 2014-02-23 MED ORDER — ZOLPIDEM TARTRATE 10 MG PO TABS: 10.0000 mg | ORAL_TABLET | Freq: Every evening | ORAL | Status: DC | PRN

## 2014-02-23 NOTE — Patient Instructions (Addendum)
Sherry [name] it was great to see you today!  I am pleased to hear that things are going well for you.  Here are some of the things we discussed today: -Possible changing Xanax in future not something I want to keep for you long term. -Blood work today -Schedule your mammogram and colonoscopy -You received a tetanus and flu vaccinations -Medications refilled  New medications: -New medication to reduce the acid. Called in to pharmacy. Take as prescribed.   Please schedule a follow-up appointment for  6 months.   Thanks for allowing me to be a part of your care! Dr. Gerarda Fraction

## 2014-02-23 NOTE — Telephone Encounter (Signed)
ok 

## 2014-02-23 NOTE — Progress Notes (Signed)
Patient ID: Sherry [name], female   DOB: [birth year], 53 y.o.   MRN: [id number]     Subjective:  Chief Complaint  Patient presents with  . 3 month f/u    HPI: Sherry [name] is a 53 y.o. presenting to clinic today to discuss the following:  #Mediation Refill- Patient came in today to meet new doctor and get her medications refilled.   #Sore Foot(L) - Patient stating that she broke her foot last year and had to undergo surgery where they placed screws in her foot. She is due for another surgery next year. She is complaining of foot pain and irregular gait now associated with her foot.   #GERD -Patient wants to know if she can increase her PPI because she is having more symptoms of acid reflux such as burning, tasting the acid in her throat, and chest/abdominal discomfort. She says it is difficult for her to lay down as well. She does not know what has increased the symptoms. She states that sometimes she takes her medication BID instead of once daily as prescribed for symptom relief.  Health Maintenance: Patient is due for colonoscopy, mammogram, flu vaccine, and Tdap.  All systems were reviewed and were negative unless otherwise noted in the HPI Past Medical, Surgical, Social, and Family History Reviewed & Updated per EMR.   Objective: BP 124/70  Pulse 84  Temp(Src) 98.6 F (37 C) (Oral)  Ht 5' (1.524 m)  Wt 173 lb (78.472 kg)  BMI 33.79 kg/m2  General: alert, well-developed, NAD, cooperative Head: normocephalic and atraumatic.  Eyes: vision grossly intact, PERRLA, no injection and anicteric.  Mouth:   MMM Neck: supple, full ROM, Lungs: CTAB, normal respiratory effort, no accessory muscle use, no crackles, and no wheezes. Heart: RRR, no M/R/G appreciated. Abdomen: soft, NT, ND Msk: no joint swelling, no joint warmth, and no redness over joints.  Extremities: No cyanosis, clubbing, edema Neurologic: A&Ox3, no neurologic deficits   Skin: turgor normal and no rashes.      Assessment/Plan: Please see problem based Assessment and Plan  Health Maintainance: Patient received her vaccinations today. She also received the numbers to set up a colonoscopy and mammogram. She stated understanding that she needs to call to set up appointments.   Luiz Blare, DO 02/24/2014, 8:22 AM PGY-1, [facility]

## 2014-02-24 MED ORDER — RANITIDINE HCL 150 MG PO CAPS: 150.0000 mg | ORAL_CAPSULE | Freq: Two times a day (BID) | ORAL | Status: DC

## 2014-02-24 NOTE — Assessment & Plan Note (Addendum)
BP appropriate. No medication adjustments needed at this time. Ordered a BMP.

## 2014-02-24 NOTE — Assessment & Plan Note (Signed)
Increasing foot discomfort affecting gait. Patient will follow-up with her surgeon. She was advised to continue pain medication until then. Another surgery scheduled for next year.

## 2014-02-24 NOTE — Assessment & Plan Note (Signed)
Increase in symptoms lately. Will prescribe H2 blocker for patient to try to see if it will help with the acid problems she is experiencing.

## 2014-03-14 ENCOUNTER — Encounter: Payer: BC Managed Care – PPO | Attending: Physical Medicine & Rehabilitation | Admitting: Registered Nurse

## 2014-04-09 ENCOUNTER — Encounter: Payer: Self-pay | Admitting: Obstetrics and Gynecology

## 2014-04-11 ENCOUNTER — Other Ambulatory Visit: Payer: Self-pay | Admitting: *Deleted

## 2014-04-12 MED ORDER — ALPRAZOLAM 1 MG PO TABS: 1.0000 mg | ORAL_TABLET | Freq: Two times a day (BID) | ORAL | Status: DC | PRN

## 2014-05-22 ENCOUNTER — Other Ambulatory Visit: Payer: Self-pay | Admitting: Obstetrics and Gynecology

## 2014-05-22 NOTE — Telephone Encounter (Signed)
Pt called and needs refills on her Xanax and Ambien left up front. Also she is not taking Zantac and this is not helping at all. She would like to go back to her previous medication. Please call patient when she can pick up her refill request. Sherry [name]

## 2014-05-24 MED ORDER — ALPRAZOLAM 1 MG PO TABS: 1.0000 mg | ORAL_TABLET | Freq: Two times a day (BID) | ORAL | Status: DC | PRN

## 2014-05-24 NOTE — Telephone Encounter (Signed)
Pt came into office today to check on status of her refill requests.  We informed her that she would need an appt to refill her phenergan.  Jazmin Hartsell,CMA

## 2014-05-24 NOTE — Telephone Encounter (Signed)
Pt called again about refill on xanax and ambien.  Generic for Zantac is not working. Her stomach is hurting and she can hardly eat. Nexium works the best and would like a refill on that

## 2014-05-24 NOTE — Telephone Encounter (Signed)
Will forward to MD to check on this.  Dr. Gerarda Fraction is in clinic this afternoon.  Maleny Candy,CMA

## 2014-05-24 NOTE — Telephone Encounter (Signed)
When looking for an appt, Jan 12 was the first available " i cant wait that long"  She is asking to either get the refill on phenergan or an appt with another dr

## 2014-05-24 NOTE — Telephone Encounter (Signed)
Please tell patient I refilled her medication for a month supply only since I will not be in clinic until the new year. She can come pick them up from the front office. She needs to come in to clinic and make an appointment with me in order to get any more refills. Please inform her that as her new doctor I need to meet her and we need to discuss a better plan as these medications are not a long term solution to her problems.

## 2014-05-25 NOTE — Telephone Encounter (Signed)
Pt is aware that rx is ready for pick up.  Already has an appt scheduled and reinforced that she needs to keep this appt.  Jazmin Hartsell,CMA

## 2014-06-19 ENCOUNTER — Ambulatory Visit (INDEPENDENT_AMBULATORY_CARE_PROVIDER_SITE_OTHER): Payer: BLUE CROSS/BLUE SHIELD | Admitting: Obstetrics and Gynecology

## 2014-06-19 ENCOUNTER — Encounter: Payer: Self-pay | Admitting: Obstetrics and Gynecology

## 2014-06-19 VITALS — BP 145/87 | HR 120 | Temp 98.1°F | Ht 60.0 in | Wt 184.0 lb

## 2014-06-19 DIAGNOSIS — F419 Anxiety disorder, unspecified: Secondary | ICD-10-CM

## 2014-06-19 DIAGNOSIS — K219 Gastro-esophageal reflux disease without esophagitis: Secondary | ICD-10-CM

## 2014-06-19 DIAGNOSIS — I10 Essential (primary) hypertension: Secondary | ICD-10-CM | POA: Diagnosis not present

## 2014-06-19 DIAGNOSIS — G479 Sleep disorder, unspecified: Secondary | ICD-10-CM

## 2014-06-19 DIAGNOSIS — G8929 Other chronic pain: Secondary | ICD-10-CM

## 2014-06-19 DIAGNOSIS — S92002S Unspecified fracture of left calcaneus, sequela: Secondary | ICD-10-CM

## 2014-06-19 DIAGNOSIS — M797 Fibromyalgia: Secondary | ICD-10-CM | POA: Diagnosis not present

## 2014-06-19 HISTORY — DX: Other chronic pain: G89.29

## 2014-06-19 MED ORDER — PANTOPRAZOLE SODIUM 40 MG PO TBEC: 40.0000 mg | DELAYED_RELEASE_TABLET | Freq: Every day | ORAL | Status: DC

## 2014-06-19 MED ORDER — ALPRAZOLAM 1 MG PO TABS: 1.0000 mg | ORAL_TABLET | Freq: Two times a day (BID) | ORAL | Status: DC | PRN

## 2014-06-19 MED ORDER — PROMETHAZINE HCL 25 MG PO TABS: ORAL_TABLET | ORAL | Status: DC

## 2014-06-19 MED ORDER — ZOLPIDEM TARTRATE 10 MG PO TABS: 10.0000 mg | ORAL_TABLET | Freq: Every evening | ORAL | Status: DC | PRN

## 2014-06-19 NOTE — Progress Notes (Signed)
Subjective: Chief Complaint  Patient presents with  . Medication Refill    HPI: Sherry [name] is a 54 y.o. presenting to clinic today to discuss the following:  #Medication refill: Patient requesting refill on her Xanax and Ambien. She states that nothing else works for her. She does not want to see a therapist and does not want to try anything else as she states things have never worked in the past.   #Leg pain: patient still experiencing pain from failed hardware of calcaneal fracture. She planes on seeing Dr. Marcelino Scot soon. She also says that the brace support is not doing well supporting her foot. She feels like she is walking on the lateral aspect of L. Foot. She says the pain is 7/10 today. She knows that this is starting to affect her balance and gait.   #Hypertension: Blood pressure at home: Patient does not monitor Blood pressure today: 145/87 Taking Meds: Yes Side effects: None ROS: Denies headache, dizziness, visual changes, nausea, vomiting, chest pain, abdominal pain or shortness of breath.  #Fibromyalgia: patient currently having more pain and states that it is due to the weather. She is seen by the pain clinic for pain management. Last seen 01/2014. She gets all her pain medication through them and plans on following up soon.  Health Maintenance: Patient is due for mammogram and colonoscopy.   All systems were reviewed and were negative unless otherwise noted in the HPI Past Medical, Surgical, Social, and Family History Reviewed & Updated per EMR.  Objective: BP 145/87 mmHg  Pulse 120  Temp(Src) 98.1 F (36.7 C) (Oral)  Ht 5' (1.524 m)  Wt 184 lb (83.462 kg)  BMI 35.94 kg/m2  Physical Exam  Constitutional: She is oriented to person, place, and time and well-developed, well-nourished, and in no distress.  Cardiovascular: Normal rate, regular rhythm, normal heart sounds and intact distal pulses.   Pulmonary/Chest: Effort normal and breath sounds normal.    Abdominal: Soft. Bowel sounds are normal. She exhibits no distension. There is no tenderness.  Neurological: She is alert and oriented to person, place, and time. She has normal strength. Gait abnormal.  L. Ankle brace present.  Skin: Skin is warm and dry.  Psychiatric: Mood normal. She is agitated.    Results for orders placed or performed in visit on 06/19/14 (from the past 24 hour(s))  Drug Scr Ur, Pain Mgmt, Reflex Conf     Status: None   Collection Time: 06/19/14  5:13 PM  Result Value Ref Range   Benzodiazepines. PPS Negative   Phencyclidine (PCP) NEG Negative   Cocaine Metabolites NEG Negative   Amphetamine Screen, Ur NEG Negative   Marijuana Metabolite NEG Negative   Opiates PPS Negative   Barbiturate Quant, Ur NEG Negative   Methadone NEG Negative   Propoxyphene NEG Negative   Creatinine,U 142.04 mg/dL   Narrative   Performed at:  [address], [address]                [place], [place] [address]  Benzodiazepines (GC/LC/MS), urine     Status: None (Preliminary result)   Collection Time: 06/19/14  5:13 PM  Result Value Ref Range   Alprazolam metabolite (GC/LC/MS), ur confirm     Midazolam (GC/LC/MS), ur confirm     Triazolam metabolite (GC/LC/MS), ur confirm     Clonazepam metabolite (GC/LC/MS), ur confirm  Flurazepam metabolite (GC/LC/MS), ur confirm     Lorazepam (GC/LC/MS), ur confirm     Nordiazepam (GC/LC/MS), ur confirm     Oxazepam (GC/LC/MS), ur confirm     Temazepam (GC/LC/MS), ur confirm     Narrative   Performed at:  [address], [address]                [place], [place] [address]  Opiates/Opioids (LC/MS-MS)     Status: None (Preliminary result)   Collection Time: 06/19/14  5:13 PM  Result Value Ref Range   Codeine Urine     Hydrocodone     Hydromorphone     Morphine Urine     Norhydrocodone, Ur     Noroxycodone, Ur     Oxycodone, ur     Oxymorphone     Narrative    Performed at:  [address]                [place], [place] [address]    Assessment/Plan: Please see problem based Assessment and Plan  Health Maintainance: Patient to schedule mammogram. She refuses colonoscopy at this time; discussed risk of not getting appropriate screening.   Luiz Blare, DO 06/19/2014, 4:55 PM PGY-1, [facility]

## 2014-06-19 NOTE — Patient Instructions (Signed)
Sherry [name] it was great to see you today.  Here are some of the things we discussed today: -No more refills of Xanax or Ambien will be prescribed so an alternative will need to be made. We discussed this today so I want you to give some thought to the options I mentioned.  -[facility] schedule your mammogram -I refilled your medications  Please schedule a follow-up appointment for as needed.   Thanks for allowing me to be a part of your care! Dr. Gerarda Fraction

## 2014-06-19 NOTE — Assessment & Plan Note (Signed)
A: Increasing foot discomfort affects gait. P: Patient states that she will follow with her foot surgeon Dr. Marcelino Scot. She is to continue using the brace for support. Suggested walking support if needed.

## 2014-06-19 NOTE — Assessment & Plan Note (Addendum)
A: Frequent flares due to time of the year. P: Patient is seen by pain clinic for management. She is to follow-up with them for pain medicine as she has signed a contract with them.

## 2014-06-19 NOTE — Assessment & Plan Note (Signed)
Blood pressure 145/87. No medication adjustments at this time. Will continue to monitor.

## 2014-06-20 LAB — DRUG SCR UR, PAIN MGMT, REFLEX CONF
Amphetamine Screen, Ur: NEGATIVE
Barbiturate Quant, Ur: NEGATIVE
CREATININE, U: 142.04 mg/dL
Cocaine Metabolites: NEGATIVE
MARIJUANA METABOLITE: NEGATIVE
Methadone: NEGATIVE
PHENCYCLIDINE (PCP): NEGATIVE
Propoxyphene: NEGATIVE

## 2014-06-20 NOTE — Assessment & Plan Note (Signed)
Given 30 day supply of Ambien with no refills. Patient instructed that this will be last refill I will prescribe. I suggested other options such as sleep hygiene techniques, sleep clinic, melatonin, trazodone, etc. Patient declined all options and states that she will find another way. Will continue to encourage alternative therapy. Please do not refill medication as this is not a good option for patient long-term.

## 2014-06-20 NOTE — Assessment & Plan Note (Signed)
Given one refill of Xanax with no additional refills. Patient instructed that this will be last refill I will prescribe. I suggested other options for anxiety such as Cymbalta or therapy but patient resistant and says she has tried everything. UDS ordered today. Will continue to encourage alternative therapy. Please do not refill medication as this is not a good option for patient long-term.

## 2014-06-22 LAB — OPIATES/OPIOIDS (LC/MS-MS)
CODEINE URINE: NEGATIVE ng/mL (ref <50)
HYDROCODONE: 446 ng/mL — AB (ref <50)
HYDROMORPHONE: 464 ng/mL — AB (ref <50)
MORPHINE: NEGATIVE ng/mL (ref <50)
NORHYDROCODONE, UR: 877 ng/mL — AB (ref <50)
Noroxycodone, Ur: NEGATIVE ng/mL (ref <50)
OXYMORPHONE, URINE: NEGATIVE ng/mL (ref <50)
Oxycodone, ur: NEGATIVE ng/mL (ref <50)

## 2014-06-22 LAB — BENZODIAZEPINES (GC/LC/MS), URINE
ALPRAZOLAMU: 434 ng/mL — AB (ref <25)
CLONAZEPAU: NEGATIVE ng/mL (ref <25)
FLURAZEPAMU: NEGATIVE ng/mL (ref <50)
Lorazepam (GC/LC/MS), ur confirm: NEGATIVE ng/mL (ref <50)
Midazolam (GC/LC/MS), ur confirm: NEGATIVE ng/mL (ref <50)
Nordiazepam (GC/LC/MS), ur confirm: NEGATIVE ng/mL (ref <50)
Oxazepam (GC/LC/MS), ur confirm: 101 ng/mL — ABNORMAL HIGH (ref <50)
Temazepam (GC/LC/MS), ur confirm: 51 ng/mL — ABNORMAL HIGH (ref <50)
Triazolam metabolite (GC/LC/MS), ur confirm: NEGATIVE ng/mL (ref <50)

## 2014-06-23 ENCOUNTER — Other Ambulatory Visit: Payer: Self-pay | Admitting: Physical Medicine & Rehabilitation

## 2014-07-09 ENCOUNTER — Encounter: Payer: Self-pay | Admitting: Obstetrics and Gynecology

## 2014-07-10 ENCOUNTER — Emergency Department (HOSPITAL_COMMUNITY)
Admission: EM | Admit: 2014-07-10 | Discharge: 2014-07-10 | Disposition: A | Discharge status: Home / Self Care | Payer: BLUE CROSS/BLUE SHIELD | Attending: Emergency Medicine | Admitting: Emergency Medicine

## 2014-07-10 ENCOUNTER — Encounter (HOSPITAL_COMMUNITY): Payer: Self-pay | Admitting: Emergency Medicine

## 2014-07-10 DIAGNOSIS — Z8619 Personal history of other infectious and parasitic diseases: Secondary | ICD-10-CM | POA: Insufficient documentation

## 2014-07-10 DIAGNOSIS — G43909 Migraine, unspecified, not intractable, without status migrainosus: Secondary | ICD-10-CM | POA: Diagnosis not present

## 2014-07-10 DIAGNOSIS — Z87442 Personal history of urinary calculi: Secondary | ICD-10-CM | POA: Diagnosis not present

## 2014-07-10 DIAGNOSIS — F419 Anxiety disorder, unspecified: Secondary | ICD-10-CM | POA: Insufficient documentation

## 2014-07-10 DIAGNOSIS — Z7982 Long term (current) use of aspirin: Secondary | ICD-10-CM | POA: Insufficient documentation

## 2014-07-10 DIAGNOSIS — I10 Essential (primary) hypertension: Secondary | ICD-10-CM | POA: Diagnosis not present

## 2014-07-10 DIAGNOSIS — M797 Fibromyalgia: Secondary | ICD-10-CM | POA: Diagnosis present

## 2014-07-10 DIAGNOSIS — Z79899 Other long term (current) drug therapy: Secondary | ICD-10-CM | POA: Insufficient documentation

## 2014-07-10 DIAGNOSIS — G8929 Other chronic pain: Secondary | ICD-10-CM | POA: Insufficient documentation

## 2014-07-10 DIAGNOSIS — Z8542 Personal history of malignant neoplasm of other parts of uterus: Secondary | ICD-10-CM | POA: Insufficient documentation

## 2014-07-10 DIAGNOSIS — K219 Gastro-esophageal reflux disease without esophagitis: Secondary | ICD-10-CM | POA: Insufficient documentation

## 2014-07-10 MED ORDER — DIAZEPAM 5 MG PO TABS: 5.0000 mg | ORAL_TABLET | Freq: Two times a day (BID) | ORAL | Status: DC

## 2014-07-10 MED ORDER — OXYCODONE HCL 5 MG PO TABS
5.0000 mg | ORAL_TABLET | Freq: Once | ORAL | Status: AC
  Administered 2014-07-10: 5 mg via ORAL
  Filled 2014-07-10: qty 1

## 2014-07-10 NOTE — ED Provider Notes (Signed)
CSN: [id number]     Arrival date & time 07/10/14  0900 History   First MD Initiated Contact with Patient 07/10/14 [phone]     Chief Complaint  Patient presents with  . Fibromyalgia   HPI  Patient is a 54 year old female with past medical history of anxiety, chronic pain, fibromyalgia, hypertension who presents to the emergency room for evaluation of allover myalgias. Patient states that for the past several days she has been out of her pain medication. She states that she has been unable to afford going to see her pain management doctor. She normally takes oxycodone multiple times a day. She states that her pain is currently consistent with her usual fibromyalgia pain. She states that is aching and sore in her upper neck and shoulder area, her back, and her legs. She states this is her usual pain. She denies any loss of her bowel or bladder, saddle anesthesias, new tingling or numbness, fevers, chills, nausea, vomiting, abdominal pain, urinary symptoms. Patient states she has been trying over-the-counter Tylenol and ibuprofen at home with little relief.  Past Medical History  Diagnosis Date  . Anxiety   . Chronic pain   . Acid reflux disease   . Sleep apnea   . Fibromyalgia   . Hypertension   . Trichomonas contact, treated   . Depression   . Migraine     last one was 04/18/13  . History of kidney stones     passed  . 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 History of blood transfusion 1981    childbirth  . Anginal pain     last time 04/18/13 in afternoon; referred to [name]   Past Surgical History  Procedure Laterality Date  . Cholecystectomy      2008  . Total abdominal hysterectomy      1986  . Wisdom tooth extraction    . Orif calcaneous fracture Left 05/16/2013    Procedure: OPEN REDUCTION INTERNAL FIXATION (ORIF) LEFT CALCANEOUS FRACTURE;  Surgeon: Rozanna Box, MD;  Location: [facility];  Service: Orthopedics;  Laterality: Left;   Family History  Problem Relation Age  of Onset  . Diabetes Mother   . Hypertension Mother   . Depression Mother   . Diabetes Sister   . Hypertension Father   . Cancer Father     Prostate  . Alcohol abuse Father   . Cancer Brother     Prostate   History  Substance Use Topics  . Smoking status: Never Smoker   . Smokeless tobacco: Never Used  . Alcohol Use: No   OB History    Gravida Para Term Preterm AB TAB SAB Ectopic Multiple Living   2 2 2       2      Review of Systems  Constitutional: Negative for fever, chills and fatigue.  Respiratory: Negative for chest tightness and shortness of breath.   Cardiovascular: Negative for chest pain and palpitations.  Genitourinary: Negative for dysuria, urgency, frequency, hematuria and difficulty urinating.  Musculoskeletal: Positive for myalgias, back pain and arthralgias. Negative for gait problem.  All other systems reviewed and are negative.     Allergies  Review of patient's allergies indicates no known allergies.  Home Medications   Prior to Admission medications   Medication Sig Start Date End Date Taking? Authorizing Provider  aspirin 81 MG tablet Take 81 mg by mouth daily.    Historical Provider, MD  aspirin EC 325 MG tablet Take 1  tablet (325 mg total) by mouth every 12 (twelve) hours. Patient not taking: Reported on 06/19/2014 05/18/13   Jari Pigg, PA-C  cyclobenzaprine (FLEXERIL) 5 MG tablet Take 1 tablet (5 mg total) by mouth 3 (three) times daily as needed for muscle spasms. 02/23/14   Katheren Shams, DO  diazepam (VALIUM) 5 MG tablet Take 1 tablet (5 mg total) by mouth 2 (two) times daily. 07/10/14   Carmon Brigandi A Forcucci, PA-C  docusate sodium 100 MG CAPS Take 100 mg by mouth 2 (two) times daily. Patient not taking: Reported on 06/19/2014 05/18/13   Jari Pigg, PA-C  gabapentin (NEURONTIN) 300 MG capsule Take 1 capsule (300 mg total) by mouth 3 (three) times daily. 12/14/13   Danella Sensing, NP  lisinopril-hydrochlorothiazide (PRINZIDE,ZESTORETIC) 20-25 MG  per tablet Take 1 tablet by mouth daily. 02/23/14   Katheren Shams, DO  nabumetone (RELAFEN) 500 MG tablet  09/15/13   Historical Provider, MD  oxyCODONE (OXY IR/ROXICODONE) 5 MG immediate release tablet Take 1 tablet (5 mg total) by mouth every 6 (six) hours as needed for breakthrough pain. Patient not taking: Reported on 06/19/2014 01/10/14   Danella Sensing, NP  oxyCODONE (OXY IR/ROXICODONE) 5 MG immediate release tablet Take 1 tablet (5 mg total) by mouth every 6 (six) hours as needed for breakthrough pain (take in between percocet for breakthrough pain). 02/23/14   Meredith Staggers, MD  pantoprazole (PROTONIX) 40 MG tablet Take 1 tablet (40 mg total) by mouth daily. 06/19/14   Katheren Shams, DO  promethazine (PHENERGAN) 25 MG tablet TAKE ONE TABLET BY MOUTH EVERY 6 HOURS AS NEEDED FOR NAUSEA. 06/19/14   Katheren Shams, DO  ranitidine (ZANTAC) 150 MG capsule Take 1 capsule (150 mg total) by mouth 2 (two) times daily. Patient not taking: Reported on 06/19/2014 02/24/14   Katheren Shams, DO  simvastatin (ZOCOR) 40 MG tablet Take 1 tablet (40 mg total) by mouth at bedtime. 02/23/14   Katheren Shams, DO   BP 133/85 mmHg  Pulse 88  Temp(Src) 98.4 F (36.9 C) (Oral)  Resp 18  SpO2 99% Physical Exam  Constitutional: She is oriented to person, place, and time. She appears well-developed and well-nourished. No distress.  HENT:  Head: Normocephalic and atraumatic.  Mouth/Throat: Oropharynx is clear and moist. No oropharyngeal exudate.  Eyes: Conjunctivae and EOM are normal. Pupils are equal, round, and reactive to light. No scleral icterus.  Neck: Normal range of motion. Neck supple. No JVD present. No thyromegaly present.  Trigger point tenderness over the bilateral trapezius  Cardiovascular: Normal rate, regular rhythm, normal heart sounds and intact distal pulses.  Exam reveals no gallop and no friction rub.   No murmur heard. Pulmonary/Chest: Breath sounds normal. No respiratory distress. She has no  wheezes. She has no rales. She exhibits no tenderness.  Abdominal: Soft. Bowel sounds are normal. She exhibits no distension and no mass. There is no tenderness. There is no rebound and no guarding.  Musculoskeletal: Normal range of motion. She exhibits tenderness.  Patient rises slowly from sitting to standing.  They walk without an antalgic gait.  There is no evidence of erythema, ecchymosis, or gross deformity.  There is tenderness to palpation over Bilateral paraspinal muscles in the cervical, thoracic, and lumbar area. There is no bony tenderness to palpation on examination.  Active ROM is limited due to pain.  Sensation to light touch is intact over all extremities.  Strength is symmetric and equal in all extremities.  Lymphadenopathy:    She has no cervical adenopathy.  Neurological: She is alert and oriented to person, place, and time. She has normal strength. No cranial nerve deficit or sensory deficit. Coordination normal.  Skin: Skin is warm and dry. She is not diaphoretic.  Psychiatric: She has a normal mood and affect. Her behavior is normal. Judgment and thought content normal.  Nursing note and vitals reviewed.   ED Course  Procedures (including critical care time) Labs Review Labs Reviewed - No data to display  Imaging Review No results found.   EKG Interpretation None      MDM   Final diagnoses:  Chronic pain  Fibromyalgia   Patient is a 54 year old female who presents emergency room for evaluation of myalgias. Patient has a long-standing history of fibromyalgia and is currently out of pain medication. She has a pain management patient. We'll give her 1 oxycodone 5 mg tablet here in the emergency room. I discussed that I cannot send her home with any medication as it would violate her pain contract. We'll discharge home with Valium. Patient to follow-up with her pain management doctor. Patient to return for symptoms of cauda equina. I doubt cauda equina at this time.  This is more consistent with fibromyalgia. Patient is stable for discharge at this time. She understands and agrees with the above plan.   Cherylann Parr, PA-C 07/10/14 [place], MD 07/10/14 [phone]

## 2014-07-10 NOTE — ED Notes (Signed)
Pt reports chronic pain from fibromyalgia and is being seen by pain management clinic and unable to pay to go at this time. Also, reports left lower leg pain from previous injury.

## 2014-07-10 NOTE — Discharge Instructions (Signed)

## 2014-07-27 ENCOUNTER — Other Ambulatory Visit: Payer: Self-pay | Admitting: Obstetrics and Gynecology

## 2014-07-29 ENCOUNTER — Telehealth: Payer: Self-pay | Admitting: Obstetrics and Gynecology

## 2014-07-29 NOTE — Telephone Encounter (Signed)
Patient on 3 different types of BZDs and extensive history of polypharmacy with use of different pharmacies. Patient had requested another refill of BZD since last office visit. Due to not wanting patient to withdraw spoke with pharmacist at [facility] and she was given Rx for Valium 2mg  BID for 15 days then 2 mg daily for 15 days with completion after that. Still trying to work with this patient on using alternative medications. She has been resistant before. If anything will only do one BZD for her anxiety but not multiple. Patient made aware.

## 2014-08-10 ENCOUNTER — Other Ambulatory Visit: Payer: Self-pay | Admitting: Registered Nurse

## 2014-08-13 ENCOUNTER — Telehealth: Payer: Self-pay | Admitting: *Deleted

## 2014-08-13 NOTE — Telephone Encounter (Signed)
Multiple notes in chart and my notes stating I will not be giving patient any more Xanax. She was made aware of this on several occasions. Please let her know she can make an appointment with me to discuss alternative medications for her anxiety as discussed previously.

## 2014-08-13 NOTE — Telephone Encounter (Signed)
Patient requesting rx for alprazolam 1mg 

## 2014-08-13 NOTE — Telephone Encounter (Signed)
Left message on vm asking that patient schedule appt.

## 2014-08-20 ENCOUNTER — Other Ambulatory Visit: Payer: Self-pay | Admitting: *Deleted

## 2014-10-19 ENCOUNTER — Other Ambulatory Visit: Payer: Self-pay | Admitting: *Deleted

## 2014-10-22 LAB — HM MAMMOGRAPHY

## 2014-11-15 ENCOUNTER — Other Ambulatory Visit: Payer: Self-pay | Admitting: Orthopedic Surgery

## 2014-11-15 DIAGNOSIS — M25561 Pain in right knee: Secondary | ICD-10-CM

## 2014-11-22 ENCOUNTER — Other Ambulatory Visit: Payer: Self-pay | Admitting: *Deleted

## 2014-11-30 ENCOUNTER — Ambulatory Visit
Admission: RE | Admit: 2014-11-30 | Discharge: 2014-11-30 | Disposition: A | Discharge status: Home / Self Care | Payer: BLUE CROSS/BLUE SHIELD | Source: Ambulatory Visit | Attending: Orthopedic Surgery | Admitting: Orthopedic Surgery

## 2014-11-30 DIAGNOSIS — M25561 Pain in right knee: Secondary | ICD-10-CM

## 2014-12-31 ENCOUNTER — Emergency Department (HOSPITAL_COMMUNITY)
Admission: EM | Admit: 2014-12-31 | Discharge: 2014-12-31 | Disposition: A | Discharge status: Home / Self Care | Payer: BLUE CROSS/BLUE SHIELD | Attending: Physician Assistant | Admitting: Physician Assistant

## 2014-12-31 ENCOUNTER — Encounter (HOSPITAL_COMMUNITY): Payer: Self-pay

## 2014-12-31 DIAGNOSIS — M797 Fibromyalgia: Secondary | ICD-10-CM | POA: Insufficient documentation

## 2014-12-31 DIAGNOSIS — Z79899 Other long term (current) drug therapy: Secondary | ICD-10-CM | POA: Insufficient documentation

## 2014-12-31 DIAGNOSIS — Z9049 Acquired absence of other specified parts of digestive tract: Secondary | ICD-10-CM | POA: Insufficient documentation

## 2014-12-31 DIAGNOSIS — Z90710 Acquired absence of both cervix and uterus: Secondary | ICD-10-CM | POA: Insufficient documentation

## 2014-12-31 DIAGNOSIS — Z7982 Long term (current) use of aspirin: Secondary | ICD-10-CM | POA: Insufficient documentation

## 2014-12-31 DIAGNOSIS — R1013 Epigastric pain: Secondary | ICD-10-CM | POA: Diagnosis present

## 2014-12-31 DIAGNOSIS — Z8619 Personal history of other infectious and parasitic diseases: Secondary | ICD-10-CM | POA: Diagnosis not present

## 2014-12-31 DIAGNOSIS — G43909 Migraine, unspecified, not intractable, without status migrainosus: Secondary | ICD-10-CM | POA: Insufficient documentation

## 2014-12-31 DIAGNOSIS — F419 Anxiety disorder, unspecified: Secondary | ICD-10-CM | POA: Diagnosis not present

## 2014-12-31 DIAGNOSIS — K219 Gastro-esophageal reflux disease without esophagitis: Secondary | ICD-10-CM | POA: Insufficient documentation

## 2014-12-31 DIAGNOSIS — I10 Essential (primary) hypertension: Secondary | ICD-10-CM | POA: Insufficient documentation

## 2014-12-31 DIAGNOSIS — K279 Peptic ulcer, site unspecified, unspecified as acute or chronic, without hemorrhage or perforation: Secondary | ICD-10-CM | POA: Diagnosis not present

## 2014-12-31 DIAGNOSIS — G8929 Other chronic pain: Secondary | ICD-10-CM | POA: Diagnosis not present

## 2014-12-31 DIAGNOSIS — F329 Major depressive disorder, single episode, unspecified: Secondary | ICD-10-CM | POA: Insufficient documentation

## 2014-12-31 DIAGNOSIS — Z8542 Personal history of malignant neoplasm of other parts of uterus: Secondary | ICD-10-CM | POA: Insufficient documentation

## 2014-12-31 DIAGNOSIS — I209 Angina pectoris, unspecified: Secondary | ICD-10-CM | POA: Diagnosis not present

## 2014-12-31 DIAGNOSIS — K30 Functional dyspepsia: Secondary | ICD-10-CM

## 2014-12-31 DIAGNOSIS — K319 Disease of stomach and duodenum, unspecified: Secondary | ICD-10-CM

## 2014-12-31 DIAGNOSIS — Z87442 Personal history of urinary calculi: Secondary | ICD-10-CM | POA: Diagnosis not present

## 2014-12-31 LAB — URINALYSIS, ROUTINE W REFLEX MICROSCOPIC
Bilirubin Urine: NEGATIVE
Glucose, UA: NEGATIVE mg/dL
Hgb urine dipstick: NEGATIVE
Ketones, ur: NEGATIVE mg/dL
LEUKOCYTES UA: NEGATIVE
Nitrite: NEGATIVE
Protein, ur: NEGATIVE mg/dL
Specific Gravity, Urine: 1.015 (ref 1.005–1.030)
UROBILINOGEN UA: 0.2 mg/dL (ref 0.0–1.0)
pH: 6 (ref 5.0–8.0)

## 2014-12-31 LAB — CBC
HCT: 37.6 % (ref 36.0–46.0)
Hemoglobin: 12.5 g/dL (ref 12.0–15.0)
MCH: 30.6 pg (ref 26.0–34.0)
MCHC: 33.2 g/dL (ref 30.0–36.0)
MCV: 91.9 fL (ref 78.0–100.0)
PLATELETS: 508 10*3/uL — AB (ref 150–400)
RBC: 4.09 MIL/uL (ref 3.87–5.11)
RDW: 14 % (ref 11.5–15.5)
WBC: 11 10*3/uL — AB (ref 4.0–10.5)

## 2014-12-31 LAB — COMPREHENSIVE METABOLIC PANEL
ALBUMIN: 4.2 g/dL (ref 3.5–5.0)
ALT: 19 U/L (ref 14–54)
ANION GAP: 6 (ref 5–15)
AST: 15 U/L (ref 15–41)
Alkaline Phosphatase: 79 U/L (ref 38–126)
BILIRUBIN TOTAL: 0.3 mg/dL (ref 0.3–1.2)
BUN: 15 mg/dL (ref 6–20)
CO2: 28 mmol/L (ref 22–32)
CREATININE: 0.85 mg/dL (ref 0.44–1.00)
Calcium: 9.2 mg/dL (ref 8.9–10.3)
Chloride: 104 mmol/L (ref 101–111)
GFR calc Af Amer: >60 mL/min (ref >60)
Glucose, Bld: 159 mg/dL — ABNORMAL HIGH (ref 65–99)
Potassium: 3.6 mmol/L (ref 3.5–5.1)
SODIUM: 138 mmol/L (ref 135–145)
Total Protein: 8 g/dL (ref 6.5–8.1)

## 2014-12-31 LAB — LIPASE, BLOOD: LIPASE: 36 U/L (ref 22–51)

## 2014-12-31 MED ORDER — ONDANSETRON HCL 4 MG PO TABS: 4.0000 mg | ORAL_TABLET | Freq: Four times a day (QID) | ORAL | Status: DC

## 2014-12-31 MED ORDER — ONDANSETRON 4 MG PO TBDP
4.0000 mg | ORAL_TABLET | Freq: Once | ORAL | Status: AC
Start: 2014-12-31 — End: 2014-12-31
  Administered 2014-12-31: 4 mg via ORAL
  Filled 2014-12-31: qty 1

## 2014-12-31 MED ORDER — PROMETHAZINE HCL 25 MG RE SUPP: 25.0000 mg | Freq: Four times a day (QID) | RECTAL | Status: DC | PRN

## 2014-12-31 MED ORDER — GI COCKTAIL ~~LOC~~
30.0000 mL | Freq: Once | ORAL | Status: AC
  Administered 2014-12-31: 30 mL via ORAL
  Filled 2014-12-31: qty 30

## 2014-12-31 NOTE — Discharge Instructions (Signed)
Please use her Prilosec as directed. Please use Zofran as needed for nausea. Please monitor for new or worsening signs or symptoms, return immediately if any present.

## 2014-12-31 NOTE — ED Provider Notes (Signed)
CSN: [id number]     Arrival date & time 12/31/14  1348 History   First MD Initiated Contact with Patient 12/31/14 1456     Chief Complaint  Patient presents with  . Abdominal Pain  . Back Pain  . Nausea   HPI   54 year old female presents today with epigastric and left upper quadrant pain. Patient reports symptoms started yesterday, gradual onset. She describes it as a "stabbing" pain that comes and goes. Patient reports she used ibuprofen yesterday which improved symptoms. She was able to sleep on and off through the day yesterday. Patient reports pain is not made worse by any movements, slightly  worsened by food and drink, reports some nausea denies vomiting. She denies any other abdominal pain, changes in urine color clarity or characteristics. Patient reports bowel movements have been "hard" and only small amounts over the last few days. She reports she has not been eating and drinking normally, large amounts of meats, low fiber, low by mouth water. States that she has been using NSAIDS recently for headaches, has not been taking her Prilosec. Denies dark or tarry stools, no blood per rectum. Patient reports symptoms are worsened by laying back. Patient denies fever, chills, chest pain, shortness of breath. Patient reports history of GERD, does report some indigestion.      Past Medical History  Diagnosis Date  . Anxiety   . Chronic pain   . Acid reflux disease   . Sleep apnea   . Fibromyalgia   . Hypertension   . Trichomonas contact, treated   . Depression   . Migraine     last one was 04/18/13  . History of kidney stones     passed  . 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 History of blood transfusion 1981    childbirth  . Anginal pain     last time 04/18/13 in afternoon; referred to [name]   Past Surgical History  Procedure Laterality Date  . Cholecystectomy      2008  . Total abdominal hysterectomy      1986  . Wisdom tooth extraction    . Orif calcaneous  fracture Left 05/16/2013    Procedure: OPEN REDUCTION INTERNAL FIXATION (ORIF) LEFT CALCANEOUS FRACTURE;  Surgeon: Rozanna Box, MD;  Location: [facility];  Service: Orthopedics;  Laterality: Left;   Family History  Problem Relation Age of Onset  . Diabetes Mother   . Hypertension Mother   . Depression Mother   . Diabetes Sister   . Hypertension Father   . Cancer Father     Prostate  . Alcohol abuse Father   . Cancer Brother     Prostate   History  Substance Use Topics  . Smoking status: Never Smoker   . Smokeless tobacco: Never Used  . Alcohol Use: No   OB History    Gravida Para Term Preterm AB TAB SAB Ectopic Multiple Living   2 2 2       2      Review of Systems  All other systems reviewed and are negative.   Allergies  Review of patient's allergies indicates no known allergies.  Home Medications   Prior to Admission medications   Medication Sig Start Date End Date Taking? Authorizing Provider  aspirin 81 MG tablet Take 81 mg by mouth daily.   Yes Historical Provider, MD  clonazePAM (KLONOPIN) 0.5 MG tablet Take 0.5 mg by mouth daily as needed for anxiety.  11/29/14  Yes Historical Provider, MD  cyclobenzaprine (FLEXERIL) 10 MG tablet Take 10 mg by mouth daily as needed for muscle spasms.   Yes Historical Provider, MD  diazepam (VALIUM) 5 MG tablet Take 1 tablet (5 mg total) by mouth 2 (two) times daily. 07/10/14  Yes Courtney Forcucci, PA-C  gabapentin (NEURONTIN) 600 MG tablet Take 600 mg by mouth 3 (three) times daily.   Yes Historical Provider, MD  lisinopril-hydrochlorothiazide (PRINZIDE,ZESTORETIC) 20-25 MG per tablet Take 1 tablet by mouth daily. 02/23/14  Yes Katheren Shams, DO  meloxicam (MOBIC) 15 MG tablet Take 1 tablet by mouth daily. 11/29/14  Yes Historical Provider, MD  oxyCODONE (OXY IR/ROXICODONE) 5 MG immediate release tablet Take 1 tablet (5 mg total) by mouth every 6 (six) hours as needed for breakthrough pain (take in between percocet for breakthrough  pain). 02/23/14  Yes Meredith Staggers, MD  pantoprazole (PROTONIX) 40 MG tablet Take 1 tablet (40 mg total) by mouth daily. 06/19/14  Yes Katheren Shams, DO  promethazine (PHENERGAN) 25 MG tablet TAKE ONE TABLET BY MOUTH EVERY 6 HOURS AS NEEDED FOR NAUSEA. 06/19/14  Yes Katheren Shams, DO  simvastatin (ZOCOR) 40 MG tablet Take 1 tablet (40 mg total) by mouth at bedtime. 02/23/14  Yes Katheren Shams, DO  Vitamin D, Ergocalciferol, (DRISDOL) 50000 UNITS CAPS capsule Take 50,000 Units by mouth once a week. 11/29/14  Yes Historical Provider, MD  zolpidem (AMBIEN) 10 MG tablet Take 10 mg by mouth at bedtime as needed for sleep.  11/29/14  Yes Historical Provider, MD  aspirin EC 325 MG tablet Take 1 tablet (325 mg total) by mouth every 12 (twelve) hours. Patient not taking: Reported on 06/19/2014 05/18/13   Ainsley Spinner, PA-C  cyclobenzaprine (FLEXERIL) 5 MG tablet Take 1 tablet (5 mg total) by mouth 3 (three) times daily as needed for muscle spasms. Patient not taking: Reported on 12/31/2014 02/23/14   Katheren Shams, DO  docusate sodium 100 MG CAPS Take 100 mg by mouth 2 (two) times daily. Patient not taking: Reported on 06/19/2014 05/18/13   Ainsley Spinner, PA-C  etodolac (LODINE) 400 MG tablet Take 400 mg by mouth 2 (two) times daily. 12/25/14   Historical Provider, MD  gabapentin (NEURONTIN) 300 MG capsule Take 1 capsule (300 mg total) by mouth 3 (three) times daily. Patient not taking: Reported on 12/31/2014 12/14/13   Bayard Hugger, NP  oxyCODONE (OXY IR/ROXICODONE) 5 MG immediate release tablet Take 1 tablet (5 mg total) by mouth every 6 (six) hours as needed for breakthrough pain. Patient not taking: Reported on 06/19/2014 01/10/14   Bayard Hugger, NP  ranitidine (ZANTAC) 150 MG capsule Take 1 capsule (150 mg total) by mouth 2 (two) times daily. Patient not taking: Reported on 06/19/2014 02/24/14   Katheren Shams, DO   BP 132/70 mmHg  Pulse 93  Temp(Src) 98.7 F (37.1 C) (Oral)  Resp 18  SpO2 100%    Physical Exam  Constitutional: She is oriented to person, place, and time. She appears well-developed and well-nourished.  HENT:  Head: Normocephalic and atraumatic.  Eyes: Pupils are equal, round, and reactive to light.  Neck: Normal range of motion. Neck supple. No JVD present. No tracheal deviation present. No thyromegaly present.  Cardiovascular: Normal rate, regular rhythm, normal heart sounds and intact distal pulses.  Exam reveals no gallop and no friction rub.   No murmur heard. Pulmonary/Chest: Effort normal and breath sounds normal. No stridor. No respiratory distress. She has  no wheezes. She has no rales. She exhibits no tenderness.  Abdominal: Soft. Bowel sounds are normal. She exhibits no distension and no mass. There is tenderness. There is no rebound and no guarding.  Mild epigastric tenderness to palpation  Musculoskeletal: Normal range of motion.  Lymphadenopathy:    She has no cervical adenopathy.  Neurological: She is alert and oriented to person, place, and time. Coordination normal.  Skin: Skin is warm and dry.  Psychiatric: She has a normal mood and affect. Her behavior is normal. Judgment and thought content normal.  Nursing note and vitals reviewed.   ED Course  Procedures (including critical care time) Labs Review Labs Reviewed  COMPREHENSIVE METABOLIC PANEL - Abnormal; Notable for the following:    Glucose, Bld 159 (*)    All other components within normal limits  CBC - Abnormal; Notable for the following:    WBC 11.0 (*)    Platelets 508 (*)    All other components within normal limits  URINALYSIS, ROUTINE W REFLEX MICROSCOPIC (NOT AT [facility]) - Abnormal; Notable for the following:    APPearance CLOUDY (*)    All other components within normal limits  LIPASE, BLOOD    Imaging Review No results found.   EKG Interpretation None      MDM   Final diagnoses:  Peptic disease    Labs: Urinalysis, lipase, CMP, CBC- no significant  findings  Imaging: None indicated  Consults:  Therapeutics: GI cocktail, Zofran  Discharge Meds: Prilosec, Zofran  Assessment/Plan: Patient presents with epigastric left upper quadrant pain. Patient has a history of GERD, currently not taking her Prilosec, poor diet, recent NSAID use. This likely represents peptic disease. Patient treated here with Zofran and GI cocktail with symptomatic improvement. Labs are reassuring, patient denies dark or tarry stools, no blood per rectum. Unlikely perforation. Patient is in no acute distress, symptoms greatly improved, she has hardly scheduled a primary care provider appointment next Monday. She'll be encouraged to follow up with her primary care provider, start taking her Prilosec, and use Zofran as needed for nausea. Patient given strict return cautions, verbalizes understanding and agreement to today's plan and had no further questions or concerns at the time of discharge.        Okey Regal, PA-C 12/31/14 1624  Courteney Julio Alm, MD 12/31/14 2237

## 2014-12-31 NOTE — ED Notes (Signed)
Pt c/o LUQ pain, L mid back pain, and nausea x 1 day.  Pain score 9/10.  Denies v/d.  Pt reports taking ibuprofen w/o relief.  Hx of kidney stones.

## 2015-01-10 ENCOUNTER — Encounter: Payer: Self-pay | Admitting: Cardiology

## 2015-02-18 ENCOUNTER — Inpatient Hospital Stay (HOSPITAL_COMMUNITY)
Admission: AD | Admit: 2015-02-18 | Discharge: 2015-02-18 | Disposition: A | Discharge status: Home / Self Care | Payer: BLUE CROSS/BLUE SHIELD | Source: Ambulatory Visit | Attending: Family Medicine | Admitting: Family Medicine

## 2015-02-18 ENCOUNTER — Encounter (HOSPITAL_COMMUNITY): Payer: Self-pay | Admitting: *Deleted

## 2015-02-18 DIAGNOSIS — N390 Urinary tract infection, site not specified: Secondary | ICD-10-CM | POA: Diagnosis not present

## 2015-02-18 DIAGNOSIS — I10 Essential (primary) hypertension: Secondary | ICD-10-CM | POA: Diagnosis not present

## 2015-02-18 DIAGNOSIS — F419 Anxiety disorder, unspecified: Secondary | ICD-10-CM | POA: Diagnosis not present

## 2015-02-18 DIAGNOSIS — R300 Dysuria: Secondary | ICD-10-CM | POA: Diagnosis present

## 2015-02-18 DIAGNOSIS — F329 Major depressive disorder, single episode, unspecified: Secondary | ICD-10-CM | POA: Diagnosis not present

## 2015-02-18 DIAGNOSIS — M797 Fibromyalgia: Secondary | ICD-10-CM | POA: Diagnosis not present

## 2015-02-18 DIAGNOSIS — Z87442 Personal history of urinary calculi: Secondary | ICD-10-CM | POA: Diagnosis not present

## 2015-02-18 DIAGNOSIS — K219 Gastro-esophageal reflux disease without esophagitis: Secondary | ICD-10-CM | POA: Insufficient documentation

## 2015-02-18 DIAGNOSIS — G4730 Sleep apnea, unspecified: Secondary | ICD-10-CM | POA: Diagnosis not present

## 2015-02-18 LAB — URINE MICROSCOPIC-ADD ON

## 2015-02-18 LAB — URINALYSIS, ROUTINE W REFLEX MICROSCOPIC
Bilirubin Urine: NEGATIVE
GLUCOSE, UA: NEGATIVE mg/dL
Ketones, ur: NEGATIVE mg/dL
Nitrite: NEGATIVE
Protein, ur: NEGATIVE mg/dL
Specific Gravity, Urine: <1.005 — ABNORMAL LOW (ref 1.005–1.030)
Urobilinogen, UA: 0.2 mg/dL (ref 0.0–1.0)
pH: 5.5 (ref 5.0–8.0)

## 2015-02-18 MED ORDER — NITROFURANTOIN MONOHYD MACRO 100 MG PO CAPS: 100.0000 mg | ORAL_CAPSULE | Freq: Two times a day (BID) | ORAL | Status: DC

## 2015-02-18 NOTE — MAU Provider Note (Signed)
History     CSN: [id number]  Arrival date and time: 02/18/15 1948   First Provider Initiated Contact with Patient 02/18/15 2049      No chief complaint on file.  Urinary Tract Infection  This is a new problem. The current episode started in the past 7 days. The problem occurs every urination. The problem has been gradually worsening. The quality of the pain is described as burning. The pain is at a severity of 5/10. There has been no fever. She is sexually active. There is no history of pyelonephritis. Associated symptoms include frequency, hesitancy and urgency. Pertinent negatives include no nausea or vomiting. She has tried nothing for the symptoms.     Past Medical History  Diagnosis Date  . Anxiety   . Chronic pain   . Acid reflux disease   . Sleep apnea   . Fibromyalgia   . Hypertension   . Trichomonas contact, treated   . Depression   . Migraine     last one was 04/18/13  . History of kidney stones     passed  . 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 History of blood transfusion 1981    childbirth  . Anginal pain     last time 04/18/13 in afternoon; referred to [name]    Past Surgical History  Procedure Laterality Date  . Cholecystectomy      2008  . Total abdominal hysterectomy      1986  . Wisdom tooth extraction    . Orif calcaneous fracture Left 05/16/2013    Procedure: OPEN REDUCTION INTERNAL FIXATION (ORIF) LEFT CALCANEOUS FRACTURE;  Surgeon: Rozanna Box, MD;  Location: [facility];  Service: Orthopedics;  Laterality: Left;    Family History  Problem Relation Age of Onset  . Diabetes Mother   . Hypertension Mother   . Depression Mother   . Diabetes Sister   . Hypertension Father   . Cancer Father     Prostate  . Alcohol abuse Father   . Cancer Brother     Prostate    Social History  Substance Use Topics  . Smoking status: Never Smoker   . Smokeless tobacco: Never Used  . Alcohol Use: No    Allergies: No Known  Allergies  Prescriptions prior to admission  Medication Sig Dispense Refill Last Dose  . aspirin-acetaminophen-caffeine (EXCEDRIN MIGRAINE) 250-250-65 MG per tablet Take 2 tablets by mouth every 6 (six) hours as needed for headache.   02/17/2015 at Unknown time  . cyclobenzaprine (FLEXERIL) 10 MG tablet Take 10 mg by mouth daily as needed for muscle spasms.   02/17/2015 at Unknown time  . cyclobenzaprine (FLEXERIL) 5 MG tablet Take 1 tablet (5 mg total) by mouth 3 (three) times daily as needed for muscle spasms. 60 tablet 2 02/17/2015 at Unknown time  . ibuprofen (ADVIL,MOTRIN) 200 MG tablet Take 400 mg by mouth every 6 (six) hours as needed for moderate pain.   02/18/2015 at Unknown time  . lisinopril-hydrochlorothiazide (PRINZIDE,ZESTORETIC) 20-25 MG per tablet Take 1 tablet by mouth daily. 90 tablet 3 Past Month at Unknown time  . pantoprazole (PROTONIX) 40 MG tablet Take 1 tablet (40 mg total) by mouth daily. 30 tablet 3 02/18/2015 at Unknown time  . simvastatin (ZOCOR) 40 MG tablet Take 1 tablet (40 mg total) by mouth at bedtime. 90 tablet 3 Past Month at Unknown time  . Vitamin D, Ergocalciferol, (DRISDOL) 50000 UNITS CAPS capsule Take 50,000 Units by mouth  once a week.  2 Past Month at Unknown time  . zolpidem (AMBIEN) 10 MG tablet Take 10 mg by mouth at bedtime as needed for sleep.   0 02/17/2015 at Unknown time  . aspirin EC 325 MG tablet Take 1 tablet (325 mg total) by mouth every 12 (twelve) hours. (Patient not taking: Reported on 06/19/2014) 60 tablet 1 Not Taking at Unknown time  . clonazePAM (KLONOPIN) 0.5 MG tablet Take 0.5 mg by mouth daily as needed for anxiety.   1 Not Taking at Unknown time  . diazepam (VALIUM) 5 MG tablet Take 1 tablet (5 mg total) by mouth 2 (two) times daily. (Patient not taking: Reported on 02/18/2015) 10 tablet 0 12/29/2014  . gabapentin (NEURONTIN) 300 MG capsule Take 1 capsule (300 mg total) by mouth 3 (three) times daily. (Patient not taking: Reported on 12/31/2014)  90 capsule 3 Not Taking at Unknown time  . ondansetron (ZOFRAN) 4 MG tablet Take 1 tablet (4 mg total) by mouth every 6 (six) hours. (Patient not taking: Reported on 02/18/2015) 12 tablet 0   . oxyCODONE (OXY IR/ROXICODONE) 5 MG immediate release tablet Take 1 tablet (5 mg total) by mouth every 6 (six) hours as needed for breakthrough pain. (Patient not taking: Reported on 06/19/2014) 100 tablet 0 Not Taking at Unknown time  . oxyCODONE (OXY IR/ROXICODONE) 5 MG immediate release tablet Take 1 tablet (5 mg total) by mouth every 6 (six) hours as needed for breakthrough pain (take in between percocet for breakthrough pain). (Patient not taking: Reported on 02/18/2015) 100 tablet 0 2 months  . promethazine (PHENERGAN) 25 MG suppository Place 1 suppository (25 mg total) rectally every 6 (six) hours as needed for nausea or vomiting. (Patient not taking: Reported on 02/18/2015) 12 each 0 Not Taking at Unknown time  . ranitidine (ZANTAC) 150 MG capsule Take 1 capsule (150 mg total) by mouth 2 (two) times daily. (Patient not taking: Reported on 06/19/2014) 30 capsule 3 Not Taking at Unknown time    Review of Systems  Constitutional: Negative for fever.  Gastrointestinal: Positive for abdominal pain. Negative for nausea, vomiting, diarrhea and constipation.  Genitourinary: Positive for dysuria, hesitancy, urgency and frequency.   Physical Exam   Blood pressure 159/85, pulse 101, temperature 98.7 F (37.1 C), temperature source Oral, resp. rate 16, height 5' (1.524 m), weight 80.74 kg (178 lb), SpO2 99 %.  Physical Exam  Nursing note and vitals reviewed. Constitutional: She is oriented to person, place, and time. She appears well-developed and well-nourished. No distress.  Cardiovascular: Normal rate.   Respiratory: Effort normal.  GI: Soft. There is no tenderness. There is no rebound.  Genitourinary:  No CVA tenderness   Neurological: She is alert and oriented to person, place, and time.  Skin: Skin is  warm and dry.  Psychiatric: She has a normal mood and affect.   Results for orders placed or performed during the hospital encounter of 02/18/15 (from the past 24 hour(s))  Urinalysis, Routine w reflex microscopic (not at [facility])     Status: Abnormal   Collection Time: 02/18/15  7:48 PM  Result Value Ref Range   Color, Urine YELLOW YELLOW   APPearance CLEAR CLEAR   Specific Gravity, Urine <1.005 (L) 1.005 - 1.030   pH 5.5 5.0 - 8.0   Glucose, UA NEGATIVE NEGATIVE mg/dL   Hgb urine dipstick MODERATE (A) NEGATIVE   Bilirubin Urine NEGATIVE NEGATIVE   Ketones, ur NEGATIVE NEGATIVE mg/dL   Protein, ur NEGATIVE NEGATIVE mg/dL  Urobilinogen, UA 0.2 0.0 - 1.0 mg/dL   Nitrite NEGATIVE NEGATIVE   Leukocytes, UA SMALL (A) NEGATIVE  Urine microscopic-add on     Status: None   Collection Time: 02/18/15  7:48 PM  Result Value Ref Range   WBC, UA 0-2 <3 WBC/hpf   Bacteria, UA RARE RARE    MAU Course  Procedures  MDM   Assessment and Plan   1. UTI (lower urinary tract infection)    DC home Comfort measures reviewed  UC pending  RX: macrobid #10  Return to MAU as needed   Follow-up Information    Schedule an appointment as soon as possible for a visit with Maximino Greenland, MD.   Specialty:  Internal Medicine   Contact information:   [address] [place] [address] [phone]         Mathis Bud 02/18/2015, 8:50 PM

## 2015-02-18 NOTE — Discharge Instructions (Signed)

## 2015-02-18 NOTE — MAU Note (Signed)
Pt reports lower back pain, urinary frequency, and urinary urgency.

## 2015-02-21 LAB — URINE CULTURE: Culture: >=100000

## 2015-02-22 ENCOUNTER — Telehealth: Payer: Self-pay | Admitting: Advanced Practice Midwife

## 2015-02-22 NOTE — Telephone Encounter (Signed)
Pt urine culture indicates some intermediate resistance to macrobid.  Pt prescribed macrobid on 9/12 in MAU. Left message for pt to return call about lab results.

## 2015-02-24 ENCOUNTER — Other Ambulatory Visit: Payer: Self-pay | Admitting: Medical

## 2015-02-24 DIAGNOSIS — N3001 Acute cystitis with hematuria: Secondary | ICD-10-CM

## 2015-02-24 MED ORDER — CIPROFLOXACIN HCL 500 MG PO TABS: 500.0000 mg | ORAL_TABLET | Freq: Two times a day (BID) | ORAL | Status: DC

## 2015-02-24 NOTE — Progress Notes (Signed)
Urine culture shows intermediate sensitivity to Macrobid. Rx for Cipro sent to patient's pharmacy. Patient to return call to MAU for results. Needs to be instructed of new Rx and need to D/C Macrobid.   Luvenia Redden, PA-C 02/24/2015 7:55 AM

## 2015-03-25 ENCOUNTER — Emergency Department (HOSPITAL_COMMUNITY)
Admission: EM | Admit: 2015-03-25 | Discharge: 2015-03-25 | Disposition: A | Discharge status: Home / Self Care | Payer: BLUE CROSS/BLUE SHIELD | Attending: Emergency Medicine | Admitting: Emergency Medicine

## 2015-03-25 ENCOUNTER — Encounter (HOSPITAL_COMMUNITY): Payer: Self-pay | Admitting: *Deleted

## 2015-03-25 ENCOUNTER — Emergency Department (HOSPITAL_COMMUNITY): Payer: BLUE CROSS/BLUE SHIELD

## 2015-03-25 DIAGNOSIS — I10 Essential (primary) hypertension: Secondary | ICD-10-CM | POA: Insufficient documentation

## 2015-03-25 DIAGNOSIS — Z87828 Personal history of other (healed) physical injury and trauma: Secondary | ICD-10-CM | POA: Diagnosis not present

## 2015-03-25 DIAGNOSIS — G8929 Other chronic pain: Secondary | ICD-10-CM | POA: Diagnosis not present

## 2015-03-25 DIAGNOSIS — F329 Major depressive disorder, single episode, unspecified: Secondary | ICD-10-CM | POA: Insufficient documentation

## 2015-03-25 DIAGNOSIS — Z87442 Personal history of urinary calculi: Secondary | ICD-10-CM | POA: Diagnosis not present

## 2015-03-25 DIAGNOSIS — M797 Fibromyalgia: Secondary | ICD-10-CM | POA: Diagnosis not present

## 2015-03-25 DIAGNOSIS — Z792 Long term (current) use of antibiotics: Secondary | ICD-10-CM | POA: Insufficient documentation

## 2015-03-25 DIAGNOSIS — I209 Angina pectoris, unspecified: Secondary | ICD-10-CM | POA: Insufficient documentation

## 2015-03-25 DIAGNOSIS — Z8619 Personal history of other infectious and parasitic diseases: Secondary | ICD-10-CM | POA: Insufficient documentation

## 2015-03-25 DIAGNOSIS — M25572 Pain in left ankle and joints of left foot: Secondary | ICD-10-CM | POA: Diagnosis not present

## 2015-03-25 DIAGNOSIS — M7981 Nontraumatic hematoma of soft tissue: Secondary | ICD-10-CM | POA: Diagnosis not present

## 2015-03-25 DIAGNOSIS — M79672 Pain in left foot: Secondary | ICD-10-CM | POA: Diagnosis present

## 2015-03-25 DIAGNOSIS — Z7982 Long term (current) use of aspirin: Secondary | ICD-10-CM | POA: Diagnosis not present

## 2015-03-25 DIAGNOSIS — Z79899 Other long term (current) drug therapy: Secondary | ICD-10-CM | POA: Diagnosis not present

## 2015-03-25 DIAGNOSIS — Z8542 Personal history of malignant neoplasm of other parts of uterus: Secondary | ICD-10-CM | POA: Diagnosis not present

## 2015-03-25 DIAGNOSIS — G43909 Migraine, unspecified, not intractable, without status migrainosus: Secondary | ICD-10-CM | POA: Insufficient documentation

## 2015-03-25 DIAGNOSIS — K219 Gastro-esophageal reflux disease without esophagitis: Secondary | ICD-10-CM | POA: Insufficient documentation

## 2015-03-25 MED ORDER — HYDROCODONE-ACETAMINOPHEN 5-325 MG PO TABS
1.0000 | ORAL_TABLET | Freq: Once | ORAL | Status: AC
  Administered 2015-03-25: 1 via ORAL
  Filled 2015-03-25: qty 1

## 2015-03-25 MED ORDER — TRAMADOL HCL 50 MG PO TABS: 50.0000 mg | ORAL_TABLET | Freq: Four times a day (QID) | ORAL | Status: DC | PRN

## 2015-03-25 MED ORDER — HYDROCODONE-ACETAMINOPHEN 5-325 MG PO TABS: 1.0000 | ORAL_TABLET | Freq: Four times a day (QID) | ORAL | Status: DC | PRN

## 2015-03-25 NOTE — ED Notes (Signed)
Pt has hx of left foot injury/ break, surgery in past, is suppose to get pins taken out next month. Reports she was cleaning out a storage unit yesterday and overdid it, now having left ankle/ heel pain. Pain 8/10.

## 2015-03-25 NOTE — ED Notes (Signed)
Pt alert and oriented x4. Respirations even and unlabored, bilateral symmetrical rise and fall of chest. Skin warm and dry. In no acute distress. Denies needs.   

## 2015-03-25 NOTE — ED Provider Notes (Signed)
CSN: [id number]     Arrival date & time 03/25/15  0254 History   First MD Initiated Contact with Patient 03/25/15 1010     Chief Complaint  Patient presents with  . Foot Pain     (Consider location/radiation/quality/duration/timing/severity/associated sxs/prior Treatment) HPI   Sherry [name] is a 54 y.o. female  PCP: SANDERS,ROBYN N, MD  Blood pressure 126/76, pulse 103, temperature 98.6 F (37 C), temperature source Oral, resp. rate 16, SpO2 96 %.  SIGNIFICANT PMH: anxiety, fibromyalgia, chronic pain, acid reflux disease, hypertension, trichomonas, depression, migraines, uterine cancer (hx) with total hysterectomy, hx of angina. Hx of ORIF by Dr. Marcelino Scot in 2014 of left Calcaneous.  CHIEF COMPLAINT: left heel pain  Patient has history of chronic left heel pain after a ORIF due to calcaneal fracture. She has been having symptoms for a long time and because of this she is due to have her pins removed next month by Dr. Wylene Simmer at [facility] orthopedics. She reports on Friday she walks a lot more than normal because her daughter was in a parade at a local high school, she cleaned more than normal at her house on Saturday and was on her feet a lot clean out the home shed on Sunday. She reports that starting on Friday her heel pain was progressively getting worse and then today it hurt so bad she no longer was to put pressure on it. She has not tried using ice because that makes her fibromyalgia worse. She has however taken some ibuprofen which helped the pain does not help her walk on it. She comes to the ER because she would like to get evaluated. She reports that she did not think to call orthopedic doctor but that maybe she should have. Patient is able to drive but did not drive herself to the ER today. Denies any significant trauma, no calf or upper leg pain.  Negative ROS: Confusion, diaphoresis, fever, headache, weakness (general or focal), change of vision,  neck pain, dysphagia,  aphagia, chest pain, shortness of breath,  back pain, abdominal pains, nausea, vomiting, diarrhea,  rash.    Past Medical History  Diagnosis Date  . Anxiety   . Chronic pain   . Acid reflux disease   . Sleep apnea   . Fibromyalgia   . Hypertension   . Trichomonas contact, treated   . Depression   . Migraine     last one was 04/18/13  . History of kidney stones     passed  . Cancer (HCC)     uterine- had hysterectomy  . History of blood transfusion 1981    childbirth  . Anginal pain (HCC)     last time 04/18/13 in afternoon; referred to Dr. Paula Ross   Past Surgical History  Procedure Laterality Date  . Cholecystectomy      2008  . Total abdominal hysterectomy      19 86  . Wisdom tooth extraction    . Orif calcaneous fracture Left 05/16/2013    Procedure: OPEN REDUCTION INTERNAL FIXATION (ORIF) LEFT CALCANEOUS FRACTURE;  Surgeon: Rozanna Box, MD;  Location: [facility];  Service: Orthopedics;  Laterality: Left;   Family History  Problem Relation Age of Onset  . Diabetes Mother   . Hypertension Mother   . Depression Mother   . Diabetes Sister   . Hypertension Father   . Cancer Father     Prostate  . Alcohol abuse Father   . Cancer Brother  Prostate   Social History  Substance Use Topics  . Smoking status: Never Smoker   . Smokeless tobacco: Never Used  . Alcohol Use: No   OB History    Gravida Para Term Preterm AB TAB SAB Ectopic Multiple Living   2 2 2       2      Review of Systems  10 Systems reviewed and are negative for acute change except as noted in the HPI.     Allergies  Review of patient's allergies indicates no known allergies.  Home Medications   Prior to Admission medications   Medication Sig Start Date End Date Taking? Authorizing Provider  aspirin EC 325 MG tablet Take 1 tablet (325 mg total) by mouth every 12 (twelve) hours. Patient not taking: Reported on 06/19/2014 05/18/13   Ainsley Spinner, PA-C  aspirin-acetaminophen-caffeine  [facility] MIGRAINE) [phone] MG per tablet Take 2 tablets by mouth every 6 (six) hours as needed for headache.    Historical Provider, MD  ciprofloxacin (CIPRO) 500 MG tablet Take 1 tablet (500 mg total) by mouth 2 (two) times daily. 02/24/15   Luvenia Redden, PA-C  clonazePAM (KLONOPIN) 0.5 MG tablet Take 0.5 mg by mouth daily as needed for anxiety.  11/29/14   Historical Provider, MD  cyclobenzaprine (FLEXERIL) 10 MG tablet Take 10 mg by mouth daily as needed for muscle spasms.    Historical Provider, MD  cyclobenzaprine (FLEXERIL) 5 MG tablet Take 1 tablet (5 mg total) by mouth 3 (three) times daily as needed for muscle spasms. 02/23/14   Katheren Shams, DO  gabapentin (NEURONTIN) 300 MG capsule Take 1 capsule (300 mg total) by mouth 3 (three) times daily. Patient not taking: Reported on 12/31/2014 12/14/13   Bayard Hugger, NP  ibuprofen (ADVIL,MOTRIN) 200 MG tablet Take 400 mg by mouth every 6 (six) hours as needed for moderate pain.    Historical Provider, MD  lisinopril-hydrochlorothiazide (PRINZIDE,ZESTORETIC) 20-25 MG per tablet Take 1 tablet by mouth daily. 02/23/14   Katheren Shams, DO  ondansetron (ZOFRAN) 4 MG tablet Take 1 tablet (4 mg total) by mouth every 6 (six) hours. Patient not taking: Reported on 02/18/2015 12/31/14   Okey Regal, PA-C  oxyCODONE (OXY IR/ROXICODONE) 5 MG immediate release tablet Take 1 tablet (5 mg total) by mouth every 6 (six) hours as needed for breakthrough pain. Patient not taking: Reported on 06/19/2014 01/10/14   Bayard Hugger, NP  pantoprazole (PROTONIX) 40 MG tablet Take 1 tablet (40 mg total) by mouth daily. 06/19/14   Katheren Shams, DO  promethazine (PHENERGAN) 25 MG suppository Place 1 suppository (25 mg total) rectally every 6 (six) hours as needed for nausea or vomiting. Patient not taking: Reported on 02/18/2015 12/31/14   Okey Regal, PA-C  ranitidine (ZANTAC) 150 MG capsule Take 1 capsule (150 mg total) by mouth 2 (two) times daily. Patient not  taking: Reported on 06/19/2014 02/24/14   Katheren Shams, DO  simvastatin (ZOCOR) 40 MG tablet Take 1 tablet (40 mg total) by mouth at bedtime. 02/23/14   Katheren Shams, DO  traMADol (ULTRAM) 50 MG tablet Take 1 tablet (50 mg total) by mouth every 6 (six) hours as needed. 03/25/15   Delos Haring, PA-C  Vitamin D, Ergocalciferol, (DRISDOL) 50000 UNITS CAPS capsule Take 50,000 Units by mouth once a week. 11/29/14   Historical Provider, MD  zolpidem (AMBIEN) 10 MG tablet Take 10 mg by mouth at bedtime as needed for sleep.  11/29/14  Historical Provider, MD   BP 126/76 mmHg  Pulse 103  Temp(Src) 98.6 F (37 C) (Oral)  Resp 16  SpO2 96% Physical Exam  Constitutional: She appears well-developed and well-nourished. No distress.  HENT:  Head: Normocephalic and atraumatic.  Eyes: Pupils are equal, round, and reactive to light.  Neck: Normal range of motion. Neck supple.  Cardiovascular: Normal rate and regular rhythm.   Pulmonary/Chest: Effort normal.  Abdominal: Soft.  Musculoskeletal:       Left ankle: She exhibits swelling and ecchymosis. She exhibits normal range of motion, no deformity, no laceration and normal pulse. Tenderness (significant tenderness to heel. no external abnormalities noted. pedal pulses are symmetrical ). Lateral malleolus and medial malleolus tenderness found. Achilles tendon normal.  Neurological: She is alert.  Skin: Skin is warm and dry.  Nursing note and vitals reviewed.   ED Course  Procedures (including critical care time) Labs Review Labs Reviewed - No data to display  Imaging Review Dg Ankle Complete Left  03/25/2015  CLINICAL DATA:  Left heel and ankle pain.  Prior fracture . EXAM: LEFT ANKLE COMPLETE - 3+ VIEW COMPARISON:  05/16/2013 left calcaneus radiographs. FINDINGS: Lateral surgical plate with multiple interlocking screws is seen in the left calcaneus, with no evidence of hardware fracture or loosening. No acute osseous fracture, dislocation or  suspicious focal osseous lesion. Left ankle mortise appears intact. Diffuse osteopenia. Small Achilles of focal heel spur. IMPRESSION: 1. No evidence of hardware complication in the left calcaneus hardware. 2. No acute fracture or malalignment in the left ankle. 3. Diffuse osteopenia. 4. Small Achilles left calcaneal spur. Electronically Signed   By: Ilona Sorrel M.D.   On: 03/25/2015 11:43   Dg Foot Complete Left  03/25/2015  CLINICAL DATA:  Left heel and ankle pain, history of prior fracture EXAM: LEFT FOOT - COMPLETE 3+ VIEW COMPARISON:  05/16/2013, 04/02/2013 FINDINGS: Status post ORIF prior calcaneus fracture. No acute fracture identified. No evidence of periosteal reaction. Stable Achilles enthesopathy. There is evidence of degenerative change with osteophyte formation in the subtalar joint. There may be a small ankle joint effusion. IMPRESSION: Posttraumatic and degenerative changes.  No acute findings. Electronically Signed   By: Skipper Cliche M.D.   On: 03/25/2015 11:41   I have personally reviewed and evaluated these images and lab results as part of my medical decision-making.   EKG Interpretation None      MDM   Final diagnoses:  Left foot pain    No sign of infection on physical exam. X-rays show post traumatic and degenerative changes but no acute findings of the left foot or left ankle. Will give patient Ace wrap and crutches. I will recommend elevation. Patient says that she cannot use ice because it triggers her fibromyalgia. She has been recommended to call her orthopedic physician for further recommendations. Small prescription for Ultram given, 10 tabs.  Medications  HYDROcodone-acetaminophen (NORCO/VICODIN) 5-325 MG per tablet 1 tablet (1 tablet Oral Given 03/25/15 1144)    54 y.o.Omayra T Turko's medical screening exam was performed and I feel the patient has had an appropriate workup for their chief complaint at this time and likelihood of emergent condition  existing is low. They have been counseled on decision, discharge, follow up and which symptoms necessitate immediate return to the emergency department. They or their family verbally stated understanding and agreement with plan and discharged in stable condition.   Vital signs are stable at discharge. Filed Vitals:   03/25/15 1001  BP: 126/76  Pulse: 103  Temp: 98.6 F (37 C)  Resp: [address] PA-C 03/25/15 [place], MD 03/26/15 0700

## 2015-03-25 NOTE — Discharge Instructions (Signed)

## 2015-10-10 ENCOUNTER — Emergency Department (HOSPITAL_COMMUNITY)
Admission: EM | Admit: 2015-10-10 | Discharge: 2015-10-10 | Disposition: A | Discharge status: Home / Self Care | Payer: BLUE CROSS/BLUE SHIELD | Attending: Emergency Medicine | Admitting: Emergency Medicine

## 2015-10-10 ENCOUNTER — Emergency Department (HOSPITAL_COMMUNITY): Payer: BLUE CROSS/BLUE SHIELD

## 2015-10-10 ENCOUNTER — Encounter (HOSPITAL_COMMUNITY): Payer: Self-pay | Admitting: Emergency Medicine

## 2015-10-10 DIAGNOSIS — I10 Essential (primary) hypertension: Secondary | ICD-10-CM | POA: Insufficient documentation

## 2015-10-10 DIAGNOSIS — F329 Major depressive disorder, single episode, unspecified: Secondary | ICD-10-CM | POA: Insufficient documentation

## 2015-10-10 DIAGNOSIS — M797 Fibromyalgia: Secondary | ICD-10-CM | POA: Insufficient documentation

## 2015-10-10 DIAGNOSIS — Z791 Long term (current) use of non-steroidal anti-inflammatories (NSAID): Secondary | ICD-10-CM | POA: Insufficient documentation

## 2015-10-10 DIAGNOSIS — Z792 Long term (current) use of antibiotics: Secondary | ICD-10-CM | POA: Insufficient documentation

## 2015-10-10 DIAGNOSIS — Z8542 Personal history of malignant neoplasm of other parts of uterus: Secondary | ICD-10-CM | POA: Insufficient documentation

## 2015-10-10 DIAGNOSIS — R0602 Shortness of breath: Secondary | ICD-10-CM | POA: Diagnosis present

## 2015-10-10 DIAGNOSIS — J029 Acute pharyngitis, unspecified: Secondary | ICD-10-CM | POA: Insufficient documentation

## 2015-10-10 DIAGNOSIS — Z79899 Other long term (current) drug therapy: Secondary | ICD-10-CM | POA: Insufficient documentation

## 2015-10-10 DIAGNOSIS — K219 Gastro-esophageal reflux disease without esophagitis: Secondary | ICD-10-CM | POA: Insufficient documentation

## 2015-10-10 DIAGNOSIS — Z79891 Long term (current) use of opiate analgesic: Secondary | ICD-10-CM | POA: Diagnosis not present

## 2015-10-10 DIAGNOSIS — Z7982 Long term (current) use of aspirin: Secondary | ICD-10-CM | POA: Diagnosis not present

## 2015-10-10 DIAGNOSIS — J40 Bronchitis, not specified as acute or chronic: Secondary | ICD-10-CM | POA: Insufficient documentation

## 2015-10-10 LAB — CBC
HCT: 37.4 % (ref 36.0–46.0)
Hemoglobin: 12.4 g/dL (ref 12.0–15.0)
MCH: 30 pg (ref 26.0–34.0)
MCHC: 33.2 g/dL (ref 30.0–36.0)
MCV: 90.3 fL (ref 78.0–100.0)
Platelets: 459 10*3/uL — ABNORMAL HIGH (ref 150–400)
RBC: 4.14 MIL/uL (ref 3.87–5.11)
RDW: 13.7 % (ref 11.5–15.5)
WBC: 10.5 10*3/uL (ref 4.0–10.5)

## 2015-10-10 LAB — BASIC METABOLIC PANEL
Anion gap: 9 (ref 5–15)
BUN: 14 mg/dL (ref 6–20)
CHLORIDE: 100 mmol/L — AB (ref 101–111)
CO2: 29 mmol/L (ref 22–32)
CREATININE: 0.89 mg/dL (ref 0.44–1.00)
Calcium: 9.6 mg/dL (ref 8.9–10.3)
GFR calc Af Amer: >60 mL/min (ref >60)
GFR calc non Af Amer: >60 mL/min (ref >60)
Glucose, Bld: 123 mg/dL — ABNORMAL HIGH (ref 65–99)
Potassium: 3.9 mmol/L (ref 3.5–5.1)
Sodium: 138 mmol/L (ref 135–145)

## 2015-10-10 LAB — RAPID STREP SCREEN (MED CTR MEBANE ONLY): STREPTOCOCCUS, GROUP A SCREEN (DIRECT): NEGATIVE

## 2015-10-10 MED ORDER — AZITHROMYCIN 250 MG PO TABS: ORAL_TABLET | ORAL | Status: DC

## 2015-10-10 MED ORDER — ONDANSETRON HCL 4 MG PO TABS: 4.0000 mg | ORAL_TABLET | Freq: Four times a day (QID) | ORAL | Status: DC

## 2015-10-10 MED ORDER — SODIUM CHLORIDE 0.9 % IV BOLUS (SEPSIS)
1000.0000 mL | Freq: Once | INTRAVENOUS | Status: AC
  Administered 2015-10-10: 1000 mL via INTRAVENOUS

## 2015-10-10 MED ORDER — KETOROLAC TROMETHAMINE 30 MG/ML IJ SOLN
30.0000 mg | Freq: Once | INTRAMUSCULAR | Status: AC
  Administered 2015-10-10: 30 mg via INTRAVENOUS
  Filled 2015-10-10: qty 1

## 2015-10-10 NOTE — ED Notes (Signed)
Pt with hx of uterine cancer c/o weakness x 4 days, shortness of breath when laying down, and today new onset productive cough with thick mucus and blood. No recent chemo or radiation. Lung sounds clear.

## 2015-10-10 NOTE — Discharge Instructions (Signed)
Drink plenty of fluids and rest in a couple days. Follow-up next week if not improving

## 2015-10-10 NOTE — ED Provider Notes (Signed)
CSN: [id number]     Arrival date & time 10/10/15  1227 History   First MD Initiated Contact with Patient 10/10/15 1307     Chief Complaint  Patient presents with  . Shortness of Breath     (Consider location/radiation/quality/duration/timing/severity/associated sxs/prior Treatment) Patient is a 55 y.o. female presenting with shortness of breath. The history is provided by the patient (Patient complains a sore throat and cough some shortness of breath).  Shortness of Breath Severity:  Moderate Onset quality:  Sudden Timing:  Intermittent Progression:  Waxing and waning Chronicity:  New Context: not activity   Associated symptoms: cough   Associated symptoms: no abdominal pain, no chest pain, no headaches and no rash     Past Medical History  Diagnosis Date  . Anxiety   . Chronic pain   . Acid reflux disease   . Sleep apnea   . Fibromyalgia   . Hypertension   . Trichomonas contact, treated   . Depression   . Migraine     last one was 04/18/13  . History of kidney stones     passed  . Cancer [facility])     uterine- had hysterectomy  . History of blood transfusion 1981    childbirth  . Anginal pain ([place])     last time 04/18/13 in afternoon; referred to [name]   Past Surgical History  Procedure Laterality Date  . Cholecystectomy      2008  . Total abdominal hysterectomy      1986  . Wisdom tooth extraction    . Orif calcaneous fracture Left 05/16/2013    Procedure: OPEN REDUCTION INTERNAL FIXATION (ORIF) LEFT CALCANEOUS FRACTURE;  Surgeon: Rozanna Box, MD;  Location: [facility];  Service: Orthopedics;  Laterality: Left;   Family History  Problem Relation Age of Onset  . Diabetes Mother   . Hypertension Mother   . Depression Mother   . Diabetes Sister   . Hypertension Father   . Cancer Father     Prostate  . Alcohol abuse Father   . Cancer Brother     Prostate   Social History  Substance Use Topics  . Smoking status: Never Smoker   . Smokeless tobacco:  Never Used  . Alcohol Use: No   OB History    Gravida Para Term Preterm AB TAB SAB Ectopic Multiple Living   2 2 2       2      Review of Systems  Constitutional: Negative for appetite change and fatigue.  HENT: Negative.  Negative for congestion, ear discharge and sinus pressure.   Eyes: Negative for discharge.  Respiratory: Positive for cough and shortness of breath.   Cardiovascular: Negative for chest pain.  Gastrointestinal: Negative for abdominal pain and diarrhea.  Genitourinary: Negative for frequency and hematuria.  Musculoskeletal: Negative for back pain.  Skin: Negative for rash.  Neurological: Negative for seizures and headaches. Weakness: sore throat.  Psychiatric/Behavioral: Negative for hallucinations.      Allergies  Review of patient's allergies indicates no known allergies.  Home Medications   Prior to Admission medications   Medication Sig Start Date End Date Taking? Authorizing Provider  aspirin EC 325 MG tablet Take 1 tablet (325 mg total) by mouth every 12 (twelve) hours. Patient not taking: Reported on 06/19/2014 05/18/13   Ainsley Spinner, PA-C  aspirin-acetaminophen-caffeine [facility] MIGRAINE) [phone] MG per tablet Take 2 tablets by mouth every 6 (six) hours as needed for headache.    Historical Provider, MD  azithromycin (ZITHROMAX Z-PAK) 250 MG tablet Take 2 initially then one each day 10/10/15   Milton Ferguson, MD  ciprofloxacin (CIPRO) 500 MG tablet Take 1 tablet (500 mg total) by mouth 2 (two) times daily. 02/24/15   Luvenia Redden, PA-C  clonazePAM (KLONOPIN) 0.5 MG tablet Take 0.5 mg by mouth daily as needed for anxiety.  11/29/14   Historical Provider, MD  cyclobenzaprine (FLEXERIL) 10 MG tablet Take 10 mg by mouth daily as needed for muscle spasms.    Historical Provider, MD  cyclobenzaprine (FLEXERIL) 5 MG tablet Take 1 tablet (5 mg total) by mouth 3 (three) times daily as needed for muscle spasms. 02/23/14   Katheren Shams, DO  gabapentin (NEURONTIN)  300 MG capsule Take 1 capsule (300 mg total) by mouth 3 (three) times daily. Patient not taking: Reported on 12/31/2014 12/14/13   Bayard Hugger, NP  HYDROcodone-acetaminophen (NORCO/VICODIN) 5-325 MG tablet Take 1 tablet by mouth every 6 (six) hours as needed. 03/25/15   Tiffany Carlota Raspberry, PA-C  ibuprofen (ADVIL,MOTRIN) 200 MG tablet Take 400 mg by mouth every 6 (six) hours as needed for moderate pain.    Historical Provider, MD  lisinopril-hydrochlorothiazide (PRINZIDE,ZESTORETIC) 20-25 MG per tablet Take 1 tablet by mouth daily. 02/23/14   Katheren Shams, DO  ondansetron (ZOFRAN) 4 MG tablet Take 1 tablet (4 mg total) by mouth every 6 (six) hours. 10/10/15   Milton Ferguson, MD  oxyCODONE (OXY IR/ROXICODONE) 5 MG immediate release tablet Take 1 tablet (5 mg total) by mouth every 6 (six) hours as needed for breakthrough pain. Patient not taking: Reported on 06/19/2014 01/10/14   Bayard Hugger, NP  pantoprazole (PROTONIX) 40 MG tablet Take 1 tablet (40 mg total) by mouth daily. 06/19/14   Katheren Shams, DO  promethazine (PHENERGAN) 25 MG suppository Place 1 suppository (25 mg total) rectally every 6 (six) hours as needed for nausea or vomiting. Patient not taking: Reported on 02/18/2015 12/31/14   Okey Regal, PA-C  ranitidine (ZANTAC) 150 MG capsule Take 1 capsule (150 mg total) by mouth 2 (two) times daily. Patient not taking: Reported on 06/19/2014 02/24/14   Katheren Shams, DO  simvastatin (ZOCOR) 40 MG tablet Take 1 tablet (40 mg total) by mouth at bedtime. 02/23/14   Katheren Shams, DO  Vitamin D, Ergocalciferol, (DRISDOL) 50000 UNITS CAPS capsule Take 50,000 Units by mouth once a week. 11/29/14   Historical Provider, MD  zolpidem (AMBIEN) 10 MG tablet Take 10 mg by mouth at bedtime as needed for sleep.  11/29/14   Historical Provider, MD   BP 152/97 mmHg  Pulse 105  Temp(Src) 98.6 F (37 C) (Oral)  Resp 16  SpO2 100% Physical Exam  Constitutional: She is oriented to person, place, and time. She  appears well-developed.  HENT:  Head: Normocephalic.  Pharynx mildly inflamed  Eyes: Conjunctivae and EOM are normal. No scleral icterus.  Neck: Neck supple. No thyromegaly present.  Cardiovascular: Normal rate and regular rhythm.  Exam reveals no gallop and no friction rub.   No murmur heard. Pulmonary/Chest: No stridor. She has no wheezes. She has no rales. She exhibits no tenderness.  Abdominal: She exhibits no distension. There is no tenderness. There is no rebound.  Musculoskeletal: Normal range of motion. She exhibits no edema.  Lymphadenopathy:    She has no cervical adenopathy.  Neurological: She is oriented to person, place, and time. She exhibits normal muscle tone. Coordination normal.  Skin: No rash noted. No erythema.  Psychiatric: She has a normal mood and affect. Her behavior is normal.    ED Course  Procedures (including critical care time) Labs Review Labs Reviewed  BASIC METABOLIC PANEL - Abnormal; Notable for the following:    Chloride 100 (*)    Glucose, Bld 123 (*)    All other components within normal limits  CBC - Abnormal; Notable for the following:    Platelets 459 (*)    All other components within normal limits  RAPID STREP SCREEN (NOT AT [facility])  CULTURE, GROUP A STREP ([place])  URINALYSIS, ROUTINE W REFLEX MICROSCOPIC (NOT AT [facility])    Imaging Review Dg Chest 2 View  10/10/2015  CLINICAL DATA:  Shortness of Breath EXAM: CHEST  2 VIEW COMPARISON:  May 12, 2013 FINDINGS: Lungs are clear. Heart size and pulmonary vascularity are normal. No adenopathy. No bone lesions. IMPRESSION: No edema or consolidation. Electronically Signed   By: Lowella Grip III M.D.   On: 10/10/2015 13:01   I have personally reviewed and evaluated these images and lab results as part of my medical decision-making.   EKG Interpretation None      MDM   Final diagnoses:  Bronchitis    Patient with bronchitis and pharyngitis she will be put on Zithromax will drink  plenty of fluids and rest out of work for 2 days    Milton Ferguson, MD 10/10/15 1502

## 2015-10-13 LAB — CULTURE, GROUP A STREP (THRC)

## 2015-12-15 ENCOUNTER — Encounter (HOSPITAL_COMMUNITY): Payer: Self-pay | Admitting: Emergency Medicine

## 2015-12-15 ENCOUNTER — Emergency Department (HOSPITAL_COMMUNITY)
Admission: EM | Admit: 2015-12-15 | Discharge: 2015-12-15 | Disposition: A | Discharge status: Home / Self Care | Payer: BLUE CROSS/BLUE SHIELD | Attending: Emergency Medicine | Admitting: Emergency Medicine

## 2015-12-15 DIAGNOSIS — F329 Major depressive disorder, single episode, unspecified: Secondary | ICD-10-CM | POA: Diagnosis not present

## 2015-12-15 DIAGNOSIS — Z79899 Other long term (current) drug therapy: Secondary | ICD-10-CM | POA: Insufficient documentation

## 2015-12-15 DIAGNOSIS — I10 Essential (primary) hypertension: Secondary | ICD-10-CM | POA: Insufficient documentation

## 2015-12-15 DIAGNOSIS — Z791 Long term (current) use of non-steroidal anti-inflammatories (NSAID): Secondary | ICD-10-CM | POA: Insufficient documentation

## 2015-12-15 DIAGNOSIS — M79604 Pain in right leg: Secondary | ICD-10-CM | POA: Diagnosis present

## 2015-12-15 DIAGNOSIS — Z7982 Long term (current) use of aspirin: Secondary | ICD-10-CM | POA: Diagnosis not present

## 2015-12-15 DIAGNOSIS — G8929 Other chronic pain: Secondary | ICD-10-CM | POA: Insufficient documentation

## 2015-12-15 DIAGNOSIS — M797 Fibromyalgia: Secondary | ICD-10-CM | POA: Diagnosis not present

## 2015-12-15 MED ORDER — OXYCODONE-ACETAMINOPHEN 5-325 MG PO TABS
1.0000 | ORAL_TABLET | Freq: Once | ORAL | Status: AC
  Administered 2015-12-15: 1 via ORAL
  Filled 2015-12-15: qty 1

## 2015-12-15 NOTE — Discharge Instructions (Signed)

## 2015-12-15 NOTE — ED Notes (Signed)
Pt c/o bilateral leg pain that feels like a fibromyalgia flare up. States she used to take gabapentin but does not anymore. Alert and oriented.

## 2015-12-15 NOTE — ED Provider Notes (Addendum)
CSN: [id number]     Arrival date & time 12/15/15  2120 History   First MD Initiated Contact with Patient 12/15/15 2142     Chief Complaint  Patient presents with  . Leg Pain      HPI Patient presents with pain in both her legs. History of fibromyalgia. States her pain just flared up. Has been on medicines for it and is currently on medications but states it is not well controlled. States she had been on Neurontin but had been off it because she will just wait it did not help. She's had reactions to most medicines. Her primary care doctor has tried some and is reportedly supposed be fraying to a pain clinic again. No fevers. No trauma. States it just flares up like this sometimes.   Past Medical History  Diagnosis Date  . Anxiety   . Chronic pain   . Acid reflux disease   . Sleep apnea   . Fibromyalgia   . Hypertension   . Trichomonas contact, treated   . Depression   . Migraine     last one was 04/18/13  . History of kidney stones     passed  . 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 History of blood transfusion 1981    childbirth  . Anginal pain ([place])     last time 04/18/13 in afternoon; referred to [name]   Past Surgical History  Procedure Laterality Date  . Cholecystectomy      2008  . Total abdominal hysterectomy      1986  . Wisdom tooth extraction    . Orif calcaneous fracture Left 05/16/2013    Procedure: OPEN REDUCTION INTERNAL FIXATION (ORIF) LEFT CALCANEOUS FRACTURE;  Surgeon: Rozanna Box, MD;  Location: [facility];  Service: Orthopedics;  Laterality: Left;   Family History  Problem Relation Age of Onset  . Diabetes Mother   . Hypertension Mother   . Depression Mother   . Diabetes Sister   . Hypertension Father   . Cancer Father     Prostate  . Alcohol abuse Father   . Cancer Brother     Prostate   Social History  Substance Use Topics  . Smoking status: Never Smoker   . Smokeless tobacco: Never Used  . Alcohol Use: No   OB History     Gravida Para Term Preterm AB TAB SAB Ectopic Multiple Living   2 2 2       2      Review of Systems  Constitutional: Negative for activity change and appetite change.  HENT: Negative for congestion and sore throat.   Eyes: Negative for pain.  Respiratory: Negative for chest tightness and shortness of breath.   Cardiovascular: Negative for chest pain and leg swelling.  Gastrointestinal: Negative for nausea, vomiting, abdominal pain and diarrhea.  Genitourinary: Negative for flank pain.  Musculoskeletal: Positive for myalgias. Negative for back pain and neck stiffness.  Skin: Negative for rash.  Neurological: Negative for weakness, numbness and headaches.  Psychiatric/Behavioral: Negative for behavioral problems.      Allergies  Review of patient's allergies indicates no known allergies.  Home Medications   Prior to Admission medications   Medication Sig Start Date End Date Taking? Authorizing Provider  aspirin EC 81 MG tablet Take 81 mg by mouth daily.   Yes Historical Provider, MD  busPIRone (BUSPAR) 5 MG tablet Take 5 mg by mouth 2 (two) times daily.  11/25/15  Yes Historical  Provider, MD  clonazePAM (KLONOPIN) 0.5 MG tablet Take 0.5 mg by mouth daily as needed for anxiety.  11/29/14  Yes Historical Provider, MD  cyclobenzaprine (FLEXERIL) 10 MG tablet Take 10 mg by mouth daily as needed for muscle spasms.   Yes Historical Provider, MD  ibuprofen (ADVIL,MOTRIN) 200 MG tablet Take 400 mg by mouth every 6 (six) hours as needed for moderate pain.   Yes Historical Provider, MD  lisinopril-hydrochlorothiazide (PRINZIDE,ZESTORETIC) 20-25 MG per tablet Take 1 tablet by mouth daily. 02/23/14  Yes Katheren Shams, DO  ondansetron (ZOFRAN) 4 MG tablet Take 1 tablet (4 mg total) by mouth every 6 (six) hours. 10/10/15  Yes Milton Ferguson, MD  pantoprazole (PROTONIX) 40 MG tablet Take 1 tablet (40 mg total) by mouth daily. 06/19/14  Yes Katheren Shams, DO  promethazine (PHENERGAN) 12.5 MG tablet  Take 12.5 mg by mouth every 6 (six) hours as needed for nausea or vomiting.  11/18/15  Yes Historical Provider, MD  simvastatin (ZOCOR) 40 MG tablet Take 1 tablet (40 mg total) by mouth at bedtime. 02/23/14  Yes Katheren Shams, DO  Vitamin D, Ergocalciferol, (DRISDOL) 50000 UNITS CAPS capsule Take 50,000 Units by mouth once a week. 11/29/14  Yes Historical Provider, MD  zolpidem (AMBIEN) 10 MG tablet Take 10 mg by mouth at bedtime.  11/29/14  Yes Historical Provider, MD  aspirin EC 325 MG tablet Take 1 tablet (325 mg total) by mouth every 12 (twelve) hours. Patient not taking: Reported on 06/19/2014 05/18/13   Ainsley Spinner, PA-C  aspirin-acetaminophen-caffeine [facility] MIGRAINE) [phone] MG per tablet Take 2 tablets by mouth every 6 (six) hours as needed for headache.    Historical Provider, MD  azithromycin (ZITHROMAX Z-PAK) 250 MG tablet Take 2 initially then one each day Patient not taking: Reported on 12/15/2015 10/10/15   Milton Ferguson, MD  ciprofloxacin (CIPRO) 500 MG tablet Take 1 tablet (500 mg total) by mouth 2 (two) times daily. Patient not taking: Reported on 12/15/2015 02/24/15   Luvenia Redden, PA-C  cyclobenzaprine (FLEXERIL) 5 MG tablet Take 1 tablet (5 mg total) by mouth 3 (three) times daily as needed for muscle spasms. Patient not taking: Reported on 12/15/2015 02/23/14   Katheren Shams, DO  gabapentin (NEURONTIN) 300 MG capsule Take 1 capsule (300 mg total) by mouth 3 (three) times daily. Patient not taking: Reported on 12/31/2014 12/14/13   Bayard Hugger, NP  HYDROcodone-acetaminophen (NORCO/VICODIN) 5-325 MG tablet Take 1 tablet by mouth every 6 (six) hours as needed. Patient not taking: Reported on 12/15/2015 03/25/15   Delos Haring, PA-C  oxyCODONE (OXY IR/ROXICODONE) 5 MG immediate release tablet Take 1 tablet (5 mg total) by mouth every 6 (six) hours as needed for breakthrough pain. Patient not taking: Reported on 06/19/2014 01/10/14   Bayard Hugger, NP  promethazine (PHENERGAN) 25 MG  suppository Place 1 suppository (25 mg total) rectally every 6 (six) hours as needed for nausea or vomiting. Patient not taking: Reported on 12/15/2015 12/31/14   Okey Regal, PA-C  ranitidine (ZANTAC) 150 MG capsule Take 1 capsule (150 mg total) by mouth 2 (two) times daily. Patient not taking: Reported on 06/19/2014 02/24/14   Katheren Shams, DO   BP 114/79 mmHg  Pulse 103  Temp(Src) 98.4 F (36.9 C) (Oral)  Resp 16  SpO2 99% Physical Exam  Constitutional: She appears well-developed.  HENT:  Head: Normocephalic.  Cardiovascular: Normal rate.   Pulmonary/Chest: Effort normal.  Abdominal: Soft. There is no tenderness.  Musculoskeletal: She exhibits tenderness.  Tenderness to bilateral LE with sensation and pulses intact  Neurological: She is alert.  Skin: Skin is warm.    ED Course  Procedures (including critical care time) Labs Review Labs Reviewed - No data to display  Imaging Review No results found. I have personally reviewed and evaluated these images and lab results as part of my medical decision-making.   EKG Interpretation None      MDM   Final diagnoses:  Chronic pain  Fibromyalgia    Patient with fibromyalgia flare. Chronic pain in her legs. Her primary care doctors for management and reportedly is trying to get into a pain clinic. Given 1 Percocet here. Previous lab work reviewed and has not had severe abnormalities in the past. Will discharge home.    Davonna Belling, MD 12/15/15 [id number]  Davonna Belling, MD 01/18/16 [phone]

## 2016-04-22 ENCOUNTER — Encounter (HOSPITAL_COMMUNITY): Payer: Self-pay

## 2016-04-22 ENCOUNTER — Emergency Department (HOSPITAL_COMMUNITY)
Admission: EM | Admit: 2016-04-22 | Discharge: 2016-04-22 | Disposition: A | Discharge status: Home / Self Care | Payer: BLUE CROSS/BLUE SHIELD | Attending: Emergency Medicine | Admitting: Emergency Medicine

## 2016-04-22 DIAGNOSIS — Z7982 Long term (current) use of aspirin: Secondary | ICD-10-CM | POA: Insufficient documentation

## 2016-04-22 DIAGNOSIS — Z8542 Personal history of malignant neoplasm of other parts of uterus: Secondary | ICD-10-CM | POA: Insufficient documentation

## 2016-04-22 DIAGNOSIS — I10 Essential (primary) hypertension: Secondary | ICD-10-CM | POA: Insufficient documentation

## 2016-04-22 DIAGNOSIS — J069 Acute upper respiratory infection, unspecified: Secondary | ICD-10-CM | POA: Insufficient documentation

## 2016-04-22 DIAGNOSIS — B9789 Other viral agents as the cause of diseases classified elsewhere: Secondary | ICD-10-CM

## 2016-04-22 MED ORDER — BENZONATATE 100 MG PO CAPS: 100.0000 mg | ORAL_CAPSULE | Freq: Three times a day (TID) | ORAL | 0 refills | Status: DC

## 2016-04-22 NOTE — ED Provider Notes (Signed)
[place] DEPT Provider Note   CSN: [id number] Arrival date & time: 04/22/16  [id number]   History   Chief Complaint Chief Complaint  Patient presents with  . Cough    HPI Sherry [name] is a 55 y.o. female.  HPI   History of female presents today with complaints of upper respiratory infection. Patient reports 4 days ago she started to develop rhinorrhea, congestion, sore throat and cough. Patient denies any chest pain or shortness of breath, she denies any significant production with cough. No fevers at home, no over-the-counter medications. Patient reports she is here as her daughter has an upper respiratory infection and wanted to be additionally evaluated. She denies any history of asthma COPD, smoking, no significant chronic medical history.  Past Medical History:  Diagnosis Date  . Acid reflux disease   . Anginal pain ([place])    last time 04/18/13 in afternoon; referred to [name]  . Anxiety   . Cancer [facility])    uterine- had hysterectomy  . Chronic pain   . Depression   . Fibromyalgia   . History of blood transfusion 1981   childbirth  . History of kidney stones    passed  . Hypertension   . Migraine    last one was 04/18/13  . Sleep apnea   . Trichomonas contact, treated     Patient Active Problem List   Diagnosis Date Noted  . Chronic pain 06/19/2014  . Unspecified sleep apnea 05/18/2013  . Calcaneus fracture, left 05/16/2013  . Lumbar facet arthropathy 03/10/2013  . Left wrist pain 03/10/2013  . Headache(784.0) 12/27/2012  . Foot pain 07/07/2012  . Cough 09/25/2011  . High cholesterol 09/10/2011  . Borderline diabetes 09/10/2011  . Obesity 09/02/2011  . Chest pain 04/07/2011  . Fibromyalgia 03/24/2011  . Anxiety 03/24/2011  . Sleep disorder 03/24/2011  . Hypertension 03/24/2011  . GERD 01/05/2007    Past Surgical History:  Procedure Laterality Date  . CHOLECYSTECTOMY     2008  . ORIF CALCANEOUS FRACTURE Left 05/16/2013   Procedure:  OPEN REDUCTION INTERNAL FIXATION (ORIF) LEFT CALCANEOUS FRACTURE;  Surgeon: Rozanna Box, MD;  Location: [place];  Service: Orthopedics;  Laterality: Left;  . TOTAL ABDOMINAL HYSTERECTOMY     1986  . WISDOM TOOTH EXTRACTION      OB History    Gravida Para Term Preterm AB Living   2 2 2     2    SAB TAB Ectopic Multiple Live Births                   Home Medications    Prior to Admission medications   Medication Sig Start Date End Date Taking? Authorizing Provider  aspirin EC 81 MG tablet Take 81 mg by mouth daily.   Yes Historical Provider, MD  aspirin-acetaminophen-caffeine (EXCEDRIN MIGRAINE) [phone] MG per tablet Take 2 tablets by mouth every 6 (six) hours as needed for headache.   Yes Historical Provider, MD  clonazePAM (KLONOPIN) 0.5 MG tablet Take 0.5 mg by mouth daily as needed for anxiety.  11/29/14  Yes Historical Provider, MD  cyclobenzaprine (FLEXERIL) 10 MG tablet Take 10 mg by mouth daily as needed for muscle spasms.   Yes Historical Provider, MD  ibuprofen (ADVIL,MOTRIN) 200 MG tablet Take 400 mg by mouth every 6 (six) hours as needed for moderate pain.   Yes Historical Provider, MD  lisinopril-hydrochlorothiazide (PRINZIDE,ZESTORETIC) 20-25 MG per tablet Take 1 tablet by mouth daily. 02/23/14  Yes Lavonda Jumbo  Phelps, DO  ondansetron (ZOFRAN) 4 MG tablet Take 1 tablet (4 mg total) by mouth every 6 (six) hours. 10/10/15  Yes Milton Ferguson, MD  pantoprazole (PROTONIX) 40 MG tablet Take 1 tablet (40 mg total) by mouth daily. 06/19/14  Yes Katheren Shams, DO  promethazine (PHENERGAN) 12.5 MG tablet Take 12.5 mg by mouth every 6 (six) hours as needed for nausea or vomiting.  11/18/15  Yes Historical Provider, MD  simvastatin (ZOCOR) 40 MG tablet Take 1 tablet (40 mg total) by mouth at bedtime. 02/23/14  Yes Katheren Shams, DO  Vitamin D, Ergocalciferol, (DRISDOL) 50000 UNITS CAPS capsule Take 50,000 Units by mouth once a week. 11/29/14  Yes Historical Provider, MD  zolpidem (AMBIEN) 10  MG tablet Take 10 mg by mouth at bedtime.  11/29/14  Yes Historical Provider, MD  aspirin EC 325 MG tablet Take 1 tablet (325 mg total) by mouth every 12 (twelve) hours. Patient not taking: Reported on 06/19/2014 05/18/13   Ainsley Spinner, PA-C  azithromycin (ZITHROMAX Z-PAK) 250 MG tablet Take 2 initially then one each day Patient not taking: Reported on 12/15/2015 10/10/15   Milton Ferguson, MD  benzonatate (TESSALON) 100 MG capsule Take 1 capsule (100 mg total) by mouth every 8 (eight) hours. 04/22/16   Okey Regal, PA-C  ciprofloxacin (CIPRO) 500 MG tablet Take 1 tablet (500 mg total) by mouth 2 (two) times daily. Patient not taking: Reported on 12/15/2015 02/24/15   Luvenia Redden, PA-C  cyclobenzaprine (FLEXERIL) 5 MG tablet Take 1 tablet (5 mg total) by mouth 3 (three) times daily as needed for muscle spasms. Patient not taking: Reported on 12/15/2015 02/23/14   Katheren Shams, DO  gabapentin (NEURONTIN) 300 MG capsule Take 1 capsule (300 mg total) by mouth 3 (three) times daily. Patient not taking: Reported on 12/31/2014 12/14/13   Bayard Hugger, NP  HYDROcodone-acetaminophen (NORCO/VICODIN) 5-325 MG tablet Take 1 tablet by mouth every 6 (six) hours as needed. Patient not taking: Reported on 12/15/2015 03/25/15   Delos Haring, PA-C  oxyCODONE (OXY IR/ROXICODONE) 5 MG immediate release tablet Take 1 tablet (5 mg total) by mouth every 6 (six) hours as needed for breakthrough pain. Patient not taking: Reported on 06/19/2014 01/10/14   Bayard Hugger, NP  promethazine (PHENERGAN) 25 MG suppository Place 1 suppository (25 mg total) rectally every 6 (six) hours as needed for nausea or vomiting. Patient not taking: Reported on 12/15/2015 12/31/14   Okey Regal, PA-C  ranitidine (ZANTAC) 150 MG capsule Take 1 capsule (150 mg total) by mouth 2 (two) times daily. Patient not taking: Reported on 06/19/2014 02/24/14   Katheren Shams, DO    Family History Family History  Problem Relation Age of Onset  . Diabetes  Mother   . Hypertension Mother   . Depression Mother   . Diabetes Sister   . Hypertension Father   . Cancer Father     Prostate  . Alcohol abuse Father   . Cancer Brother     Prostate    Social History Social History  Substance Use Topics  . Smoking status: Never Smoker  . Smokeless tobacco: Never Used  . Alcohol use No     Allergies   Patient has no known allergies.   Review of Systems Review of Systems  All other systems reviewed and are negative.    Physical Exam Updated Vital Signs BP 148/92 (BP Location: Left Arm)   Pulse 117   Temp 99.1 F (37.3 C) (Oral)  Resp 18   Ht 5' (1.524 m)   Wt 77.1 kg   SpO2 95%   BMI 33.20 kg/m   Physical Exam  Constitutional: She is oriented to person, place, and time. She appears well-developed and well-nourished.  HENT:  Head: Normocephalic and atraumatic.  Right Ear: Tympanic membrane normal.  Left Ear: Tympanic membrane normal.  Nose: Rhinorrhea present.  Mouth/Throat: Uvula is midline and oropharynx is clear and moist. No oropharyngeal exudate, posterior oropharyngeal edema, posterior oropharyngeal erythema or tonsillar abscesses.  Eyes: Conjunctivae are normal. Pupils are equal, round, and reactive to light. Right eye exhibits no discharge. Left eye exhibits no discharge. No scleral icterus.  Neck: Normal range of motion. No JVD present. No tracheal deviation present.  Pulmonary/Chest: Effort normal and breath sounds normal. No stridor. No respiratory distress. She has no wheezes. She has no rales. She exhibits no tenderness.  Neurological: She is alert and oriented to person, place, and time. Coordination normal.  Psychiatric: She has a normal mood and affect. Her behavior is normal. Judgment and thought content normal.  Nursing note and vitals reviewed.    ED Treatments / Results  Labs (all labs ordered are listed, but only abnormal results are displayed) Labs Reviewed - No data to display  EKG  EKG  Interpretation None       Radiology No results found.  Procedures Procedures (including critical care time)  Medications Ordered in ED Medications - No data to display   Initial Impression / Assessment and Plan / ED Course  I have reviewed the triage vital signs and the nursing notes.  Pertinent labs & imaging results that were available during my care of the patient were reviewed by me and considered in my medical decision making (see chart for details).  Clinical Course     Final Clinical Impressions(s) / ED Diagnoses   Final diagnoses:  Viral URI with cough   Labs:  Imaging:  Consults:  Therapeutics:  Discharge Meds:   Assessment/Plan:  Patient presents with viral URI, she is afebrile and nontoxic. She has clear lung sounds, no shortness of breath. She will be discharged on symptomatic care instructions and strict return caution. She verbalized understanding and agreement to today's plan had no further questions or concerns.    New Prescriptions Discharge Medication List as of 04/22/2016  8:38 AM    START taking these medications   Details  benzonatate (TESSALON) 100 MG capsule Take 1 capsule (100 mg total) by mouth every 8 (eight) hours., Starting Wed 04/22/2016, Print         Okey Regal, PA-C 04/22/16 1559    Virgel Manifold, MD 04/25/16 1055

## 2016-04-22 NOTE — Discharge Instructions (Signed)
Please read attached information. If you experience any new or worsening signs or symptoms please return to the emergency room for evaluation. Please follow-up with your primary care provider or specialist as discussed. Please use medication prescribed only as directed and discontinue taking if you have any concerning signs or symptoms.   °

## 2017-04-08 ENCOUNTER — Encounter (INDEPENDENT_AMBULATORY_CARE_PROVIDER_SITE_OTHER): Payer: Self-pay

## 2017-04-08 ENCOUNTER — Ambulatory Visit (INDEPENDENT_AMBULATORY_CARE_PROVIDER_SITE_OTHER): Payer: Self-pay | Admitting: Internal Medicine

## 2017-04-08 VITALS — BP 154/86 | HR 97 | Temp 98.9°F | Ht 60.0 in | Wt 161.6 lb

## 2017-04-08 DIAGNOSIS — G4700 Insomnia, unspecified: Secondary | ICD-10-CM

## 2017-04-08 DIAGNOSIS — Z833 Family history of diabetes mellitus: Secondary | ICD-10-CM

## 2017-04-08 DIAGNOSIS — I10 Essential (primary) hypertension: Secondary | ICD-10-CM

## 2017-04-08 DIAGNOSIS — Z79891 Long term (current) use of opiate analgesic: Secondary | ICD-10-CM

## 2017-04-08 DIAGNOSIS — K219 Gastro-esophageal reflux disease without esophagitis: Secondary | ICD-10-CM

## 2017-04-08 DIAGNOSIS — F419 Anxiety disorder, unspecified: Secondary | ICD-10-CM

## 2017-04-08 DIAGNOSIS — M797 Fibromyalgia: Secondary | ICD-10-CM

## 2017-04-08 DIAGNOSIS — Z811 Family history of alcohol abuse and dependence: Secondary | ICD-10-CM

## 2017-04-08 DIAGNOSIS — Z8249 Family history of ischemic heart disease and other diseases of the circulatory system: Secondary | ICD-10-CM

## 2017-04-08 DIAGNOSIS — Z0189 Encounter for other specified special examinations: Secondary | ICD-10-CM

## 2017-04-08 DIAGNOSIS — Z818 Family history of other mental and behavioral disorders: Secondary | ICD-10-CM

## 2017-04-08 DIAGNOSIS — Z5181 Encounter for therapeutic drug level monitoring: Secondary | ICD-10-CM

## 2017-04-08 DIAGNOSIS — Z8042 Family history of malignant neoplasm of prostate: Secondary | ICD-10-CM

## 2017-04-08 DIAGNOSIS — Z79899 Other long term (current) drug therapy: Secondary | ICD-10-CM

## 2017-04-08 MED ORDER — PANTOPRAZOLE SODIUM 40 MG PO TBEC: 40.0000 mg | DELAYED_RELEASE_TABLET | Freq: Every day | ORAL | 4 refills | Status: DC

## 2017-04-08 MED ORDER — PROMETHAZINE HCL 12.5 MG PO TABS: 12.5000 mg | ORAL_TABLET | Freq: Three times a day (TID) | ORAL | 4 refills | Status: DC | PRN

## 2017-04-08 MED ORDER — LISINOPRIL-HYDROCHLOROTHIAZIDE 20-25 MG PO TABS: 1.0000 | ORAL_TABLET | Freq: Every day | ORAL | 4 refills | Status: DC

## 2017-04-08 MED ORDER — CYCLOBENZAPRINE HCL 10 MG PO TABS: 10.0000 mg | ORAL_TABLET | Freq: Every day | ORAL | 4 refills | Status: DC | PRN

## 2017-04-08 MED ORDER — SIMVASTATIN 40 MG PO TABS: 40.0000 mg | ORAL_TABLET | Freq: Every day | ORAL | 4 refills | Status: DC

## 2017-04-08 NOTE — Assessment & Plan Note (Signed)
Assessment: Patient has documented history of hypertension documented at 154/86 today  Plan: Prescribed lisinopril/HCTZ combo tablet at her previous dose of 20/25 mg respectively

## 2017-04-08 NOTE — Assessment & Plan Note (Addendum)
Assessment: Patient states that her current fibromyalgia pain is minimally controlled but is better with clonazepam. Patient states that she would like to seek referral to a pain clinic when she obtains discount card in her insurance.  Plan: Patient completed request records from her previous PCP.  Will evaluate all medications for efficacy she is previously utilized. Patient states that she failed Cymbalta, Neurontin, and Lyrica. Following receipt of the patient's past medical history records from previous PCP, will further assess for the need for referral to pain clinic to better evaluate for management of her fibromyalgia given her reaction to medications that are typically prescribed as first-line for fibromyalgia. The patient has violated pain contract other primary care physicians in the past. Please evaluate the database for adherence to her prescribed controlled medications.

## 2017-04-08 NOTE — Progress Notes (Signed)
   CC: Establish care  HPI:  Sherry [name] is a 56 y.o. female who presents for establishment of care and evaluation of general malaise. Patient has been off of her medications for greater than one month now as she lost her insurance and is no longer being treated by her former PCP [name]. Since that time she has been without medical assistance including medications. She has not taken her clonazepam for anxiety, flexeril for fibromyalgia, lisinopril-HCTZ combo for HTN (BP well controlled), pantoprazol for acid reflux, promethazine PRN for nausea, simvastatin 40mg , as well as Ambien for insomnia for approximately 4 weeks. Patient states that she has been sleeping approximately three hours per night at the most. She stated that in addition [name] Cancer also prescribed buspar.   Patient has attempted treatment with Cymbalta, which induced coughing, nausea and vomiting. Lyrica has reportedly made her feel generally unwell. Neurontin induced a side effect which she could not recal.   Patient has been felling unwell for approximately two weeks now. She endorses tremor, chills, feeling "edgy", headaches, nausea, constipation (OTC laxative), confusion, and generalized pain all of which she attributes to medication withdraw. She denied seizure like activity, LOC, vomiting, diarrhea, chest pain, palpitations, or lightheadedness. She states that she believes she needs her medications to improve her overall comfort.  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  Does desire a flu shot today but will wait until she receives a discount card.  Past Medical History:  Diagnosis Date  . Acid reflux disease   . Anginal pain ([place])    last time 04/18/13 in afternoon; referred to [name]  . Anxiety   . 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 Chronic pain   . Depression   . Fibromyalgia   . History of blood transfusion 1981   childbirth  . History of kidney stones     passed  . Hypertension   . Migraine    last one was 04/18/13  . Sleep apnea   . Trichomonas contact, treated    Review of Systems:  ROS negative except as per HPI.   Vitals:   04/08/17 1045  BP: (!) 154/86  Pulse: 97  Temp: 98.9 F (37.2 C)  TempSrc: Oral  SpO2: 100%  Weight: 161 lb 9.6 oz (73.3 kg)  Height: 5' (1.524 m)   Family History: Family History  Problem Relation Age of Onset  . Diabetes Mother   . Hypertension Mother   . Depression Mother   . Diabetes Sister   . Hypertension Father   . Cancer Father        Prostate  . Alcohol abuse Father   . Cancer Brother        Prostate   Social History: Denied tobacco use Denied EtOH or illicit drug use Lives at home with daughter  Physical Exam: Physical Exam  Constitutional: She appears well-nourished. No distress.  Eyes: Conjunctivae and EOM are normal.  Neck: Neck supple.  Cardiovascular: Normal rate and regular rhythm.   No murmur heard. Pulmonary/Chest: Effort normal and breath sounds normal. No respiratory distress.  Abdominal: Soft. Bowel sounds are normal. She exhibits no distension.  Musculoskeletal: She exhibits no edema or tenderness.  Neurological: She is alert.  Skin: Capillary refill takes less than 2 seconds.  Vitals reviewed.   Assessment & Plan:   See Encounters Tab for problem based charting.  Patient seen with Dr. [name]

## 2017-04-08 NOTE — Assessment & Plan Note (Signed)
Assessment: Patient with documented history of anxiety for which she takes clonazepam  Plan: Patient completed request of information form from her previous PCP.  Patient informed that upon arrival of this information we will assess the need for clonazepam prescribed as necessary.

## 2017-04-08 NOTE — Assessment & Plan Note (Signed)
Assessment: Patient has a history of gastroesophageal reflux disease but is currently asymptomatic   Plan: Refilled prescription for pantoprazole 40mg  daily

## 2017-04-08 NOTE — Patient Instructions (Signed)
Please complete your lab today so we may continue to prescribe the lisinopril and HCTZ.  Prescriptions have been refilled and sent to the [facility] on [address].  We have asked you to complete a release of information so we may obtain the records from your previous primary care physician.  Following the receipt of these records we may prescribe the remainder of her medications.  Thank you for your visit to [place] clinic.

## 2017-04-09 LAB — BMP8+ANION GAP
Anion Gap: 17 mmol/L (ref 10.0–18.0)
BUN / CREAT RATIO: 9 (ref 9–23)
BUN: 8 mg/dL (ref 6–24)
CHLORIDE: 99 mmol/L (ref 96–106)
CO2: 25 mmol/L (ref 20–29)
Calcium: 10 mg/dL (ref 8.7–10.2)
Creatinine, Ser: 0.9 mg/dL (ref 0.57–1.00)
GFR calc Af Amer: 84 mL/min/{1.73_m2} (ref >59)
GFR calc non Af Amer: 73 mL/min/{1.73_m2} (ref >59)
Glucose: 140 mg/dL — ABNORMAL HIGH (ref 65–99)
Potassium: 4.2 mmol/L (ref 3.5–5.2)
Sodium: 141 mmol/L (ref 134–144)

## 2017-04-09 NOTE — Progress Notes (Signed)
Internal Medicine Clinic Attending  I saw and evaluated the patient.  I personally confirmed the key portions of the history and exam documented by Dr. Berline Lopes and I reviewed pertinent patient test results.  The assessment, diagnosis, and plan were formulated together and I agree with the documentation in the resident's note.  New patient to our clinic presents with complaints of insomnia and fibromyalgia.  Previous primary care physician was at [facility] in [place].  It seems she might have fallen out of care there because of an insurance coverage issue.  According to the controlled substance database she has been prescribed clonazepam 0.5 mg 30 tabs monthly and zolpidem 10 mg 30 tabs monthly since at least 2016.  Last refill of zolpidem was July 30 and last for clonazepam was September 14.  She is stable today with no signs of benzo withdrawal.  I insisted that we obtain records from her primary care physician before continuing these controlled medications. We will follow-up with her in 2-4 weeks once we have reviewed records.

## 2017-07-08 ENCOUNTER — Other Ambulatory Visit: Payer: Self-pay

## 2017-07-08 ENCOUNTER — Observation Stay (HOSPITAL_COMMUNITY)
Admission: EM | Admit: 2017-07-08 | Discharge: 2017-07-09 | Disposition: A | Discharge status: Home / Self Care | Payer: Self-pay | Attending: Internal Medicine | Admitting: Internal Medicine

## 2017-07-08 ENCOUNTER — Emergency Department (HOSPITAL_COMMUNITY): Payer: Self-pay

## 2017-07-08 ENCOUNTER — Encounter (HOSPITAL_COMMUNITY): Payer: Self-pay | Admitting: Emergency Medicine

## 2017-07-08 DIAGNOSIS — M797 Fibromyalgia: Secondary | ICD-10-CM | POA: Insufficient documentation

## 2017-07-08 DIAGNOSIS — E785 Hyperlipidemia, unspecified: Secondary | ICD-10-CM | POA: Insufficient documentation

## 2017-07-08 DIAGNOSIS — F329 Major depressive disorder, single episode, unspecified: Secondary | ICD-10-CM | POA: Insufficient documentation

## 2017-07-08 DIAGNOSIS — Z8249 Family history of ischemic heart disease and other diseases of the circulatory system: Secondary | ICD-10-CM

## 2017-07-08 DIAGNOSIS — Z79899 Other long term (current) drug therapy: Secondary | ICD-10-CM | POA: Insufficient documentation

## 2017-07-08 DIAGNOSIS — G4730 Sleep apnea, unspecified: Secondary | ICD-10-CM | POA: Insufficient documentation

## 2017-07-08 DIAGNOSIS — I10 Essential (primary) hypertension: Secondary | ICD-10-CM | POA: Diagnosis present

## 2017-07-08 DIAGNOSIS — Z87442 Personal history of urinary calculi: Secondary | ICD-10-CM | POA: Insufficient documentation

## 2017-07-08 DIAGNOSIS — J101 Influenza due to other identified influenza virus with other respiratory manifestations: Principal | ICD-10-CM | POA: Diagnosis present

## 2017-07-08 DIAGNOSIS — J1100 Influenza due to unidentified influenza virus with unspecified type of pneumonia: Secondary | ICD-10-CM

## 2017-07-08 DIAGNOSIS — K219 Gastro-esophageal reflux disease without esophagitis: Secondary | ICD-10-CM | POA: Insufficient documentation

## 2017-07-08 DIAGNOSIS — G8929 Other chronic pain: Secondary | ICD-10-CM | POA: Insufficient documentation

## 2017-07-08 DIAGNOSIS — F419 Anxiety disorder, unspecified: Secondary | ICD-10-CM | POA: Insufficient documentation

## 2017-07-08 DIAGNOSIS — J1089 Influenza due to other identified influenza virus with other manifestations: Secondary | ICD-10-CM

## 2017-07-08 HISTORY — DX: Influenza due to other identified influenza virus with other respiratory manifestations: J10.1

## 2017-07-08 LAB — URINALYSIS, ROUTINE W REFLEX MICROSCOPIC
BILIRUBIN URINE: NEGATIVE
Bacteria, UA: NONE SEEN
Glucose, UA: NEGATIVE mg/dL
KETONES UR: NEGATIVE mg/dL
LEUKOCYTES UA: NEGATIVE
NITRITE: NEGATIVE
PH: 6 (ref 5.0–8.0)
Protein, ur: NEGATIVE mg/dL
SPECIFIC GRAVITY, URINE: 1.009 (ref 1.005–1.030)

## 2017-07-08 LAB — CBC WITH DIFFERENTIAL/PLATELET
BASOS ABS: 0 10*3/uL (ref 0.0–0.1)
BASOS PCT: 0 %
EOS PCT: 1 %
Eosinophils Absolute: 0 10*3/uL (ref 0.0–0.7)
HEMATOCRIT: 35.7 % — AB (ref 36.0–46.0)
Hemoglobin: 12 g/dL (ref 12.0–15.0)
LYMPHS PCT: 20 %
Lymphs Abs: 1.2 10*3/uL (ref 0.7–4.0)
MCH: 30.7 pg (ref 26.0–34.0)
MCHC: 33.6 g/dL (ref 30.0–36.0)
MCV: 91.3 fL (ref 78.0–100.0)
Monocytes Absolute: 0.8 10*3/uL (ref 0.1–1.0)
Monocytes Relative: 13 %
NEUTROS ABS: 4.1 10*3/uL (ref 1.7–7.7)
Neutrophils Relative %: 66 %
PLATELETS: 439 10*3/uL — AB (ref 150–400)
RBC: 3.91 MIL/uL (ref 3.87–5.11)
RDW: 13.9 % (ref 11.5–15.5)
WBC: 6.2 10*3/uL (ref 4.0–10.5)

## 2017-07-08 LAB — COMPREHENSIVE METABOLIC PANEL
ALK PHOS: 87 U/L (ref 38–126)
ALT: 48 U/L (ref 14–54)
ANION GAP: 9 (ref 5–15)
AST: 34 U/L (ref 15–41)
Albumin: 3.9 g/dL (ref 3.5–5.0)
BILIRUBIN TOTAL: 0.2 mg/dL — AB (ref 0.3–1.2)
BUN: 10 mg/dL (ref 6–20)
CALCIUM: 9.1 mg/dL (ref 8.9–10.3)
CO2: 26 mmol/L (ref 22–32)
Chloride: 100 mmol/L — ABNORMAL LOW (ref 101–111)
Creatinine, Ser: 0.89 mg/dL (ref 0.44–1.00)
GFR calc Af Amer: >60 mL/min (ref >60)
Glucose, Bld: 109 mg/dL — ABNORMAL HIGH (ref 65–99)
POTASSIUM: 3.9 mmol/L (ref 3.5–5.1)
Sodium: 135 mmol/L (ref 135–145)
TOTAL PROTEIN: 8 g/dL (ref 6.5–8.1)

## 2017-07-08 LAB — I-STAT CG4 LACTIC ACID, ED: Lactic Acid, Venous: 0.87 mmol/L (ref 0.5–1.9)

## 2017-07-08 LAB — INFLUENZA PANEL BY PCR (TYPE A & B)
INFLBPCR: NEGATIVE
Influenza A By PCR: POSITIVE — AB

## 2017-07-08 MED ORDER — ACETAMINOPHEN 325 MG PO TABS
650.0000 mg | ORAL_TABLET | Freq: Once | ORAL | Status: AC | PRN
  Administered 2017-07-08: 650 mg via ORAL
  Filled 2017-07-08: qty 2

## 2017-07-08 MED ORDER — GUAIFENESIN-DM 100-10 MG/5ML PO SYRP
5.0000 mL | ORAL_SOLUTION | ORAL | Status: DC | PRN
  Administered 2017-07-08 – 2017-07-09 (×4): 5 mL via ORAL
  Filled 2017-07-08 (×4): qty 5

## 2017-07-08 MED ORDER — ONDANSETRON HCL 4 MG PO TABS
4.0000 mg | ORAL_TABLET | Freq: Four times a day (QID) | ORAL | Status: DC | PRN
  Administered 2017-07-09: 4 mg via ORAL
  Filled 2017-07-08: qty 1

## 2017-07-08 MED ORDER — OSELTAMIVIR PHOSPHATE 75 MG PO CAPS
75.0000 mg | ORAL_CAPSULE | Freq: Two times a day (BID) | ORAL | Status: DC
  Administered 2017-07-09: 75 mg via ORAL
  Filled 2017-07-08 (×2): qty 1

## 2017-07-08 MED ORDER — PHENOL 1.4 % MT LIQD
1.0000 | OROMUCOSAL | Status: DC | PRN
  Administered 2017-07-08: 1 via OROMUCOSAL
  Filled 2017-07-08: qty 177

## 2017-07-08 MED ORDER — ENOXAPARIN SODIUM 40 MG/0.4ML ~~LOC~~ SOLN
40.0000 mg | SUBCUTANEOUS | Status: DC
  Administered 2017-07-08: 40 mg via SUBCUTANEOUS
  Filled 2017-07-08: qty 0.4

## 2017-07-08 MED ORDER — IBUPROFEN 600 MG PO TABS
600.0000 mg | ORAL_TABLET | Freq: Four times a day (QID) | ORAL | Status: DC | PRN
  Administered 2017-07-09: 600 mg via ORAL
  Filled 2017-07-08 (×2): qty 1

## 2017-07-08 MED ORDER — OSELTAMIVIR PHOSPHATE 75 MG PO CAPS
75.0000 mg | ORAL_CAPSULE | Freq: Once | ORAL | Status: AC
  Administered 2017-07-08: 75 mg via ORAL
  Filled 2017-07-08: qty 1

## 2017-07-08 MED ORDER — ONDANSETRON HCL 4 MG/2ML IJ SOLN: 4.0000 mg | Freq: Four times a day (QID) | INTRAMUSCULAR | Status: DC | PRN

## 2017-07-08 MED ORDER — PANTOPRAZOLE SODIUM 40 MG PO TBEC
40.0000 mg | DELAYED_RELEASE_TABLET | Freq: Every day | ORAL | Status: DC
  Administered 2017-07-08 – 2017-07-09 (×2): 40 mg via ORAL
  Filled 2017-07-08 (×2): qty 1

## 2017-07-08 MED ORDER — ACETAMINOPHEN 650 MG RE SUPP: 650.0000 mg | Freq: Four times a day (QID) | RECTAL | Status: DC | PRN

## 2017-07-08 MED ORDER — GUAIFENESIN ER 600 MG PO TB12
600.0000 mg | ORAL_TABLET | Freq: Two times a day (BID) | ORAL | Status: DC
  Administered 2017-07-08 – 2017-07-09 (×2): 600 mg via ORAL
  Filled 2017-07-08 (×2): qty 1

## 2017-07-08 MED ORDER — POTASSIUM CHLORIDE IN NACL 20-0.9 MEQ/L-% IV SOLN
INTRAVENOUS | Status: DC
  Administered 2017-07-08: 21:00:00 via INTRAVENOUS
  Filled 2017-07-08 (×2): qty 1000

## 2017-07-08 MED ORDER — SODIUM CHLORIDE 0.9 % IV BOLUS (SEPSIS)
1000.0000 mL | Freq: Once | INTRAVENOUS | Status: AC
  Administered 2017-07-08: 1000 mL via INTRAVENOUS

## 2017-07-08 MED ORDER — ACETAMINOPHEN 325 MG PO TABS
650.0000 mg | ORAL_TABLET | Freq: Four times a day (QID) | ORAL | Status: DC | PRN
  Administered 2017-07-08 – 2017-07-09 (×3): 650 mg via ORAL
  Filled 2017-07-08 (×3): qty 2

## 2017-07-08 MED ORDER — SIMVASTATIN 40 MG PO TABS
40.0000 mg | ORAL_TABLET | Freq: Every day | ORAL | Status: DC
  Administered 2017-07-08: 40 mg via ORAL
  Filled 2017-07-08: qty 1

## 2017-07-08 MED ORDER — CEFTRIAXONE SODIUM 1 G IJ SOLR
1.0000 g | Freq: Once | INTRAMUSCULAR | Status: AC
  Administered 2017-07-08: 1 g via INTRAVENOUS
  Filled 2017-07-08: qty 10

## 2017-07-08 MED ORDER — GI COCKTAIL ~~LOC~~
30.0000 mL | Freq: Once | ORAL | Status: AC
  Administered 2017-07-08: 30 mL via ORAL
  Filled 2017-07-08: qty 30

## 2017-07-08 MED ORDER — AZITHROMYCIN 250 MG PO TABS
500.0000 mg | ORAL_TABLET | Freq: Once | ORAL | Status: AC
  Administered 2017-07-08: 500 mg via ORAL
  Filled 2017-07-08: qty 2

## 2017-07-08 NOTE — ED Triage Notes (Signed)
Pt comes in with complaints of emesis, fever, body aches, cough for the past 2 days.  Pt reports she has not actually checked her fever at home but feels hot and has chills.  Febrile on assessment. Has been taking theraflu and motrin without much relief. Small amount of production with cough.  Able to speak in full sentences. Denies being around anyone who has been sick.

## 2017-07-08 NOTE — ED Provider Notes (Signed)
[facility] DEPT Provider Note   CSN: [id number] Arrival date & time: 07/08/17  1305     History   Chief Complaint Chief Complaint  Patient presents with  . Flu Like Symptoms    HPI Sherry [name] is a 57 y.o. female.  The history is provided by the patient. No language interpreter was used.   Sherry [name] is a 57 y.o. female who presents to the Emergency Department complaining of fever, cough.  She reports 2 days of generalized body aches with subjective fevers as well as cough.  She has associated vomiting, ear pain.  She has generalized abdominal pain due to coughing.  She has tried TheraFlu and ibuprofen as well as Sudafed at home with no significant improvement in her symptoms.  Symptoms are moderate, constant, worsening. Past Medical History:  Diagnosis Date  . Acid reflux disease   . Anginal pain ([place])    last time 04/18/13 in afternoon; referred to [name]  . Anxiety   . Cancer [facility])    uterine- had hysterectomy  . Chronic pain   . Depression   . Fibromyalgia   . History of blood transfusion 1981   childbirth  . History of kidney stones    passed  . Hypertension   . Migraine    last one was 04/18/13  . Sleep apnea   . Trichomonas contact, treated     Patient Active Problem List   Diagnosis Date Noted  . Chronic pain 06/19/2014  . Unspecified sleep apnea 05/18/2013  . Calcaneus fracture, left 05/16/2013  . Lumbar facet arthropathy 03/10/2013  . Left wrist pain 03/10/2013  . Headache(784.0) 12/27/2012  . Foot pain 07/07/2012  . Cough 09/25/2011  . High cholesterol 09/10/2011  . Borderline diabetes 09/10/2011  . Obesity 09/02/2011  . Chest pain 04/07/2011  . Fibromyalgia 03/24/2011  . Anxiety 03/24/2011  . Sleep disorder 03/24/2011  . Hypertension 03/24/2011  . GERD 01/05/2007    Past Surgical History:  Procedure Laterality Date  . CHOLECYSTECTOMY     2008  . ORIF CALCANEOUS FRACTURE Left  05/16/2013   Procedure: OPEN REDUCTION INTERNAL FIXATION (ORIF) LEFT CALCANEOUS FRACTURE;  Surgeon: Rozanna Box, MD;  Location: [place];  Service: Orthopedics;  Laterality: Left;  . TOTAL ABDOMINAL HYSTERECTOMY     1986  . WISDOM TOOTH EXTRACTION      OB History    Gravida Para Term Preterm AB Living   2 2 2     2    SAB TAB Ectopic Multiple Live Births                   Home Medications    Prior to Admission medications   Medication Sig Start Date End Date Taking? Authorizing Provider  clonazePAM (KLONOPIN) 0.5 MG tablet Take 0.5 mg by mouth daily as needed for anxiety.  11/29/14  Yes [provider]  cyclobenzaprine (FLEXERIL) 10 MG tablet Take 1 tablet (10 mg total) by mouth daily as needed for muscle spasms. 04/08/17  Yes Kathi Ludwig, MD  DM-Phenylephrine-Acetaminophen [facility]) 10-5-325 MG/15ML LIQD Take 30 mLs by mouth every 8 (eight) hours as needed (flu symptoms).   Yes [provider]  ibuprofen (ADVIL,MOTRIN) 200 MG tablet Take 400 mg by mouth every 6 (six) hours as needed for fever or moderate pain.    Yes [provider]  lisinopril-hydrochlorothiazide (PRINZIDE,ZESTORETIC) 20-25 MG tablet Take 1 tablet by mouth daily. 04/08/17  Yes Kathi Ludwig, MD  pantoprazole (PROTONIX) 40 MG tablet Take 1 tablet (40 mg total) by mouth daily. 04/08/17  Yes Kathi Ludwig, MD  promethazine (PHENERGAN) 12.5 MG tablet Take 1 tablet (12.5 mg total) by mouth every 8 (eight) hours as needed for nausea or vomiting. 04/08/17  Yes Kathi Ludwig, MD  pseudoephedrine-acetaminophen (TYLENOL SINUS) 30-500 MG TABS tablet Take 1 tablet by mouth every 4 (four) hours as needed (flu symptoms).   Yes [provider]  simvastatin (ZOCOR) 40 MG tablet Take 1 tablet (40 mg total) by mouth at bedtime. 04/08/17  Yes Kathi Ludwig, MD  zolpidem (AMBIEN) 10 MG tablet Take 10 mg by mouth at bedtime.  11/29/14  Yes [provider]     Family History Family History  Problem Relation Age of Onset  . Diabetes Mother   . Hypertension Mother   . Depression Mother   . Diabetes Sister   . Hypertension Father   . Cancer Father        Prostate  . Alcohol abuse Father   . Cancer Brother        Prostate    Social History Social History   Tobacco Use  . Smoking status: Never Smoker  . Smokeless tobacco: Never Used  Substance Use Topics  . Alcohol use: No  . Drug use: No     Allergies   Patient has no known allergies.   Review of Systems Review of Systems  All other systems reviewed and are negative.    Physical Exam Updated Vital Signs BP (!) 158/96   Pulse (!) 133   Temp (!) 101.4 F (38.6 C) (Oral)   Resp 18   Wt 73.5 kg (162 lb)   SpO2 96%   BMI 31.64 kg/m   Physical Exam  Constitutional: She is oriented to person, place, and time. She appears well-developed and well-nourished.  HENT:  Head: Normocephalic and atraumatic.  Right Ear: External ear normal.  Left Ear: External ear normal.  Mouth/Throat: Oropharynx is clear and moist.  Cardiovascular: Regular rhythm.  No murmur heard. tachycardic  Pulmonary/Chest: Effort normal and breath sounds normal. No respiratory distress.  Abdominal: Soft. There is no tenderness. There is no rebound and no guarding.  Musculoskeletal: She exhibits no edema or tenderness.  Neurological: She is alert and oriented to person, place, and time.  Skin: Skin is warm and dry. Capillary refill takes less than 2 seconds.  Psychiatric: She has a normal mood and affect. Her behavior is normal.  Nursing note and vitals reviewed.    ED Treatments / Results  Labs (all labs ordered are listed, but only abnormal results are displayed) Labs Reviewed  INFLUENZA PANEL BY PCR (TYPE A & B) - Abnormal; Notable for the following components:      Result Value   Influenza A By PCR POSITIVE (*)    All other components within normal limits  CULTURE, BLOOD (ROUTINE X 2)   CULTURE, BLOOD (ROUTINE X 2)  COMPREHENSIVE METABOLIC PANEL  CBC WITH DIFFERENTIAL/PLATELET  URINALYSIS, ROUTINE W REFLEX MICROSCOPIC  I-STAT CG4 LACTIC ACID, ED    EKG  EKG Interpretation None       Radiology Dg Chest 2 View  Result Date: 07/08/2017 CLINICAL DATA:  Productive cough since Tuesday 1/07/17/17; chest pain started 07/07/17 ; nausea; headache today; HTN- on meds; non smoker EXAM: CHEST  2 VIEW COMPARISON:  10/10/2015 FINDINGS: Shallow lung inflation. Heart size is normal. There is focal patchy opacity in the left lung base consistent with infectious process. No  pleural effusions or pulmonary edema. Surgical clips are noted in the right upper quadrant of the abdomen. IMPRESSION: Left lower lobe infiltrate. Followup PA and lateral chest X-ray is recommended in 3-4 weeks following trial of antibiotic therapy to ensure resolution and exclude underlying malignancy. Electronically Signed   By: Nolon Nations M.D.   On: 07/08/2017 14:15    Procedures Procedures (including critical care time)  Medications Ordered in ED Medications  sodium chloride 0.9 % bolus 1,000 mL (not administered)  cefTRIAXone (ROCEPHIN) 1 g in dextrose 5 % 50 mL IVPB (not administered)  azithromycin (ZITHROMAX) tablet 500 mg (not administered)  acetaminophen (TYLENOL) tablet 650 mg (650 mg Oral Given 07/08/17 1343)     Initial Impression / Assessment and Plan / ED Course  I have reviewed the triage vital signs and the nursing notes.  Pertinent labs & imaging results that were available during my care of the patient were reviewed by me and considered in my medical decision making (see chart for details).     Patient with history of hypertension here for evaluation of fever, cough, body aches.  She is nontoxic appearing on examination with no respiratory distress.  Chest x-ray is concerning for left lower lobe infiltrate.  In the setting of her symptoms she was started with antibiotics for community  acquired pneumonia.  Later during patient's ED stay her flu swab came back and it was positive, question influenza pneumonia contributing to her infiltrate.  She was started on Tamiflu.  Patient with persistent and uncontrolled symptoms in the emergency department and requests admission.  Internal medicine teaching service contacted for admission.  Final Clinical Impressions(s) / ED Diagnoses   Final diagnoses:  None    ED Discharge Orders    None       Quintella Reichert, MD 07/08/17 2326

## 2017-07-08 NOTE — ED Notes (Signed)
[facility] requested. 

## 2017-07-08 NOTE — H&P (Signed)
Date: 07/08/2017               Patient Name:  Sherry [name] MRN: [id number]  DOB: 08-26-1960 Age / Sex: 57 y.o., female   PCP: Kathi Ludwig, MD         Medical Service: Internal Medicine Teaching Service         Attending Physician: Dr. Oval Linsey, MD    First Contact: Dr. Trilby Drummer Pager: [phone]  Second Contact: Dr. Reesa Chew Pager: [phone]       After Hours (After 5p/  First Contact Pager: [phone]  weekends / holidays): Second Contact Pager: [phone]   Chief Complaint: flu like symptoms  History of Present Illness:  57 yo female with PMHx significant for HTN, HLD, GERD, fibromyalgia, and anxiety presenting with chief complaint of flu-like symptoms. Patient states that 2 days prior to admission she began having non-productive cough, hoarse voice, chills, body aches, subjective fevers, nausea, vomiting (low volume), and decreased PO intake. She also endorses dyspnea on exertion, as well as generalized weakness and fatigue. She denies diarrhea. Complains of CP and abdominal pain related to the frequency and severity of her cough. She tried taking OTC Theraflu and ibuprofen, which did not improve her symptoms.   [facility] ED Course:  Vitals: BP 158/96, HR 133, RR 18, Temp 101.4, SpO2 96% on RA  Labs: Influenza A+ positive, CMET and CBC w/o significant abnormality Imaging: CXR w/ focal patchy opacity in left lung base, ?LLL infiltrate  Meds: Ceftriaxone, azithromycin, Tamiflu, 1 liter NS bolus    Meds:  Current Meds  Medication Sig  . clonazePAM (KLONOPIN) 0.5 MG tablet Take 0.5 mg by mouth daily as needed for anxiety.   . cyclobenzaprine (FLEXERIL) 10 MG tablet Take 1 tablet (10 mg total) by mouth daily as needed for muscle spasms.  Marland Kitchen DM-Phenylephrine-Acetaminophen (THERAFLU EXPRESSMAX) 10-5-325 MG/15ML LIQD Take 30 mLs by mouth every 8 (eight) hours as needed (flu symptoms).  Marland Kitchen ibuprofen (ADVIL,MOTRIN) 200 MG tablet Take 400 mg by mouth every 6 (six) hours as needed for  fever or moderate pain.   Marland Kitchen lisinopril-hydrochlorothiazide (PRINZIDE,ZESTORETIC) 20-25 MG tablet Take 1 tablet by mouth daily.  . pantoprazole (PROTONIX) 40 MG tablet Take 1 tablet (40 mg total) by mouth daily.  . promethazine (PHENERGAN) 12.5 MG tablet Take 1 tablet (12.5 mg total) by mouth every 8 (eight) hours as needed for nausea or vomiting.  . pseudoephedrine-acetaminophen (TYLENOL SINUS) 30-500 MG TABS tablet Take 1 tablet by mouth every 4 (four) hours as needed (flu symptoms).  . simvastatin (ZOCOR) 40 MG tablet Take 1 tablet (40 mg total) by mouth at bedtime.  Marland Kitchen zolpidem (AMBIEN) 10 MG tablet Take 10 mg by mouth at bedtime.      Allergies: Allergies as of 07/08/2017  . (No Known Allergies)   Past Medical History:  Diagnosis Date  . Acid reflux disease   . Anginal pain ([place])    last time 04/18/13 in afternoon; referred to [name]  . Anxiety   . Cancer [facility])    uterine- had hysterectomy  . Chronic pain   . Depression   . Fibromyalgia   . History of blood transfusion 1981   childbirth  . History of kidney stones    passed  . Hypertension   . Migraine    last one was 04/18/13  . Sleep apnea   . Trichomonas contact, treated    Family History:  Family History  Problem Relation Age of Onset  .  Diabetes Mother   . Hypertension Mother   . Depression Mother   . Diabetes Sister   . Hypertension Father   . Cancer Father        Prostate  . Alcohol abuse Father   . Cancer Brother        Prostate    Social History:  Social History   Tobacco Use  . Smoking status: Never Smoker  . Smokeless tobacco: Never Used  Substance Use Topics  . Alcohol use: No  . Drug use: No    Review of Systems: A complete ROS was negative except as per HPI.   Physical Exam: Blood pressure 129/87, pulse (!) 107, temperature 100.2 F (37.9 C), temperature source Oral, resp. rate 18, height 5' (1.524 m), weight 168 lb 3.4 oz (76.3 kg), SpO2 97 %. Physical Exam  Constitutional:  She is oriented to person, place, and time. She appears well-developed and well-nourished. No distress.  HENT:  Head: Normocephalic and atraumatic.  Mouth/Throat: No oropharyngeal exudate.  Eyes: Conjunctivae and EOM are normal. No scleral icterus.  Neck: Normal range of motion. Neck supple.  Cardiovascular: Regular rhythm, normal heart sounds and intact distal pulses. Tachycardia present.  Pulses:      Dorsalis pedis pulses are 2+ on the right side, and 2+ on the left side.  Pulmonary/Chest: Effort normal. No accessory muscle usage. No tachypnea. No respiratory distress. She has no decreased breath sounds. She has no wheezes. She has no rhonchi. She has rales in the right lower field.  Abdominal: Soft. Bowel sounds are normal. There is no tenderness.  Musculoskeletal: Normal range of motion. She exhibits no edema.  Lymphadenopathy:    She has no cervical adenopathy.  Neurological: She is alert and oriented to person, place, and time.  Skin: Skin is warm and dry.  Psychiatric: She has a normal mood and affect.    EKG: personally reviewed my interpretation is sinus tachycardia, RBBB  CXR: personally reviewed my interpretation is low lung volume compared to prior, no pleural effusions or evidence of pulmonary edema, questionable focal opacity in left lower lobe  Assessment & Plan by Problem: Principal Problem:   Influenza A Active Problems:   Hypertension  Influenza Patient presenting with flu-like symptoms with positive influenza A PCR. Was initially tachycardic and febrile in [facility] ED. Tachycardia improved after receiving 1 liter NS bolus. CXR concerning for LLL infiltrate/CAP, received ceftriaxone and azithromycin in [facility] ED. Patient was also started on Tamiflu. Patient saturating high 90s on RA without increased O2 requirement. Will hold antibiotics as patient's symptoms are more consistent with influenza rather than CAP.  -Continue Tamiflu 75 mg BID  -Supportive care with: Tylenol,  Mucinex, Zofran, Chloraseptic spray -Repeat CBC and BMET in AM -Droplet precautions  HTN, HLD BP currently normotensive 129/87. Has not taken home medications since Monday. Home BP medication regimen includes: Lisinopril-HCTZ 20-25 mg -Will hold BP med for now in setting of normotension, can resume if becomes hypertensive -Continue simvastatin 40 mg qhs  GERD -Continue Protonix 40 mg daily   Dispo: Admit patient to Observation with expected length of stay less than 2 midnights.  Signed: Melanee Spry, MD 07/08/2017, 9:15 PM  Pager: [phone]

## 2017-07-08 NOTE — ED Notes (Signed)
ED TO INPATIENT HANDOFF REPORT  Name/Age/Gender Sherry [name] 57 y.o. female  Code Status Code Status History    Date Active Date Inactive Code Status Order ID Comments User Context   05/16/2013 17:15 05/19/2013 15:15 Full Code [id number]  Leone Payor Inpatient      Home/SNF/Other Home  Chief Complaint emesis / chills / abd pain   Level of Care/Admitting Diagnosis ED Disposition    ED Disposition Condition [facility] Area: [place] [[id number]]  Level of Care: Med-Surg [16]  Diagnosis: Influenza, pneumonia [287681]  Admitting Physician: Oval Linsey [phone]  Attending Physician: Oval Linsey [[id number]]  PT Class (Do Not Modify): Observation [104]  PT Acc Code (Do Not Modify): Observation [10022]       Medical History Past Medical History:  Diagnosis Date  . Acid reflux disease   . Anginal pain ([place])    last time 04/18/13 in afternoon; referred to [name]  . Anxiety   . Cancer [facility])    uterine- had hysterectomy  . Chronic pain   . Depression   . Fibromyalgia   . History of blood transfusion 1981   childbirth  . History of kidney stones    passed  . Hypertension   . Migraine    last one was 04/18/13  . Sleep apnea   . Trichomonas contact, treated     Allergies No Known Allergies  IV Location/Drains/Wounds Patient Lines/Drains/Airways Status   Active Line/Drains/Airways    None          Labs/Imaging Results for orders placed or performed during the hospital encounter of 07/08/17 (from the past 48 hour(s))  Influenza panel by PCR (type A & B)     Status: Abnormal   Collection Time: 07/08/17  1:45 PM  Result Value Ref Range   Influenza A By PCR POSITIVE (A) NEGATIVE   Influenza B By PCR NEGATIVE NEGATIVE    Comment: (NOTE) The Xpert Xpress Flu assay is intended as an aid in the diagnosis of  influenza and should not be used as a sole basis for treatment.  This  assay is FDA approved for  nasopharyngeal swab specimens only. Nasal  washings and aspirates are unacceptable for Xpert Xpress Flu testing.   Comprehensive metabolic panel     Status: Abnormal   Collection Time: 07/08/17  3:02 PM  Result Value Ref Range   Sodium 135 135 - 145 mmol/L   Potassium 3.9 3.5 - 5.1 mmol/L   Chloride 100 (L) 101 - 111 mmol/L   CO2 26 22 - 32 mmol/L   Glucose, Bld 109 (H) 65 - 99 mg/dL   BUN 10 6 - 20 mg/dL   Creatinine, Ser 0.89 0.44 - 1.00 mg/dL   Calcium 9.1 8.9 - 10.3 mg/dL   Total Protein 8.0 6.5 - 8.1 g/dL   Albumin 3.9 3.5 - 5.0 g/dL   AST 34 15 - 41 U/L   ALT 48 14 - 54 U/L   Alkaline Phosphatase 87 38 - 126 U/L   Total Bilirubin 0.2 (L) 0.3 - 1.2 mg/dL   GFR calc non Af Amer >60 >60 mL/min   GFR calc Af Amer >60 >60 mL/min    Comment: (NOTE) The eGFR has been calculated using the CKD EPI equation. This calculation has not been validated in all clinical situations. eGFR's persistently <60 mL/min signify possible Chronic Kidney Disease.    Anion gap 9 5 - 15  CBC with Differential  Status: Abnormal   Collection Time: 07/08/17  3:02 PM  Result Value Ref Range   WBC 6.2 4.0 - 10.5 K/uL   RBC 3.91 3.87 - 5.11 MIL/uL   Hemoglobin 12.0 12.0 - 15.0 g/dL   HCT 35.7 (L) 36.0 - 46.0 %   MCV 91.3 78.0 - 100.0 fL   MCH 30.7 26.0 - 34.0 pg   MCHC 33.6 30.0 - 36.0 g/dL   RDW 13.9 11.5 - 15.5 %   Platelets 439 (H) 150 - 400 K/uL   Neutrophils Relative % 66 %   Neutro Abs 4.1 1.7 - 7.7 K/uL   Lymphocytes Relative 20 %   Lymphs Abs 1.2 0.7 - 4.0 K/uL   Monocytes Relative 13 %   Monocytes Absolute 0.8 0.1 - 1.0 K/uL   Eosinophils Relative 1 %   Eosinophils Absolute 0.0 0.0 - 0.7 K/uL   Basophils Relative 0 %   Basophils Absolute 0.0 0.0 - 0.1 K/uL  I-Stat CG4 Lactic Acid, ED     Status: None   Collection Time: 07/08/17  3:23 PM  Result Value Ref Range   Lactic Acid, Venous 0.87 0.5 - 1.9 mmol/L  Urinalysis, Routine w reflex microscopic     Status: Abnormal   Collection  Time: 07/08/17  3:49 PM  Result Value Ref Range   Color, Urine YELLOW YELLOW   APPearance CLEAR CLEAR   Specific Gravity, Urine 1.009 1.005 - 1.030   pH 6.0 5.0 - 8.0   Glucose, UA NEGATIVE NEGATIVE mg/dL   Hgb urine dipstick SMALL (A) NEGATIVE   Bilirubin Urine NEGATIVE NEGATIVE   Ketones, ur NEGATIVE NEGATIVE mg/dL   Protein, ur NEGATIVE NEGATIVE mg/dL   Nitrite NEGATIVE NEGATIVE   Leukocytes, UA NEGATIVE NEGATIVE   RBC / HPF 0-5 0 - 5 RBC/hpf   WBC, UA 0-5 0 - 5 WBC/hpf   Bacteria, UA NONE SEEN NONE SEEN   Squamous Epithelial / LPF 0-5 (A) NONE SEEN   Mucus PRESENT    Dg Chest 2 View  Result Date: 07/08/2017 CLINICAL DATA:  Productive cough since Tuesday 1/07/17/17; chest pain started 07/07/17 ; nausea; headache today; HTN- on meds; non smoker EXAM: CHEST  2 VIEW COMPARISON:  10/10/2015 FINDINGS: Shallow lung inflation. Heart size is normal. There is focal patchy opacity in the left lung base consistent with infectious process. No pleural effusions or pulmonary edema. Surgical clips are noted in the right upper quadrant of the abdomen. IMPRESSION: Left lower lobe infiltrate. Followup PA and lateral chest X-ray is recommended in 3-4 weeks following trial of antibiotic therapy to ensure resolution and exclude underlying malignancy. Electronically Signed   By: Nolon Nations M.D.   On: 07/08/2017 14:15    Pending Labs Unresulted Labs (From admission, onward)   Start     Ordered   07/08/17 1514  Culture, blood (routine x 2)  BLOOD CULTURE X 2,   STAT     07/08/17 1514      Vitals/Pain Today's Vitals   07/08/17 1654 07/08/17 1700 07/08/17 1900 07/08/17 1924  BP:  126/83 131/89   Pulse:  (!) 105 (!) 109   Resp:  20 (!) 21   Temp:    99.6 F (37.6 C)  TempSrc:    Oral  SpO2:  100% 98%   Weight:      Height:      PainSc: 7        Isolation Precautions Droplet precaution  Medications Medications  acetaminophen (TYLENOL) tablet 650 mg (650  mg Oral Given 07/08/17 1343)   sodium chloride 0.9 % bolus 1,000 mL (0 mLs Intravenous Stopped 07/08/17 1906)  cefTRIAXone (ROCEPHIN) 1 g in dextrose 5 % 50 mL IVPB (0 g Intravenous Stopped 07/08/17 1655)  azithromycin (ZITHROMAX) tablet 500 mg (500 mg Oral Given 07/08/17 1550)  oseltamivir (TAMIFLU) capsule 75 mg (75 mg Oral Given 07/08/17 1550)  gi cocktail (Maalox,Lidocaine,Donnatal) (30 mLs Oral Given 07/08/17 1906)    Mobility walks

## 2017-07-08 NOTE — ED Notes (Signed)
Called report to [place], Therapist, sports  on [employer]

## 2017-07-08 NOTE — ED Notes (Signed)
Pt is alert and oriented x 4 and is verbally responsive. Pt reports generalized body aches, nasal congestion and malaise. Pt dtr is at bedside.

## 2017-07-08 NOTE — ED Notes (Signed)
Pt dtr is at bedside and is on the phone with pt sister and states" that pt sister wants pt to be admitted and wants to speak with the MD" Informed family member that MD upon assessment reported to pt that admission was not likely.

## 2017-07-09 DIAGNOSIS — J101 Influenza due to other identified influenza virus with other respiratory manifestations: Secondary | ICD-10-CM

## 2017-07-09 LAB — CBC
HEMATOCRIT: 32.9 % — AB (ref 36.0–46.0)
Hemoglobin: 10.9 g/dL — ABNORMAL LOW (ref 12.0–15.0)
MCH: 31 pg (ref 26.0–34.0)
MCHC: 33.1 g/dL (ref 30.0–36.0)
MCV: 93.5 fL (ref 78.0–100.0)
PLATELETS: 368 10*3/uL (ref 150–400)
RBC: 3.52 MIL/uL — ABNORMAL LOW (ref 3.87–5.11)
RDW: 14.6 % (ref 11.5–15.5)
WBC: 4.3 10*3/uL (ref 4.0–10.5)

## 2017-07-09 LAB — BASIC METABOLIC PANEL
Anion gap: 8 (ref 5–15)
BUN: 7 mg/dL (ref 6–20)
CHLORIDE: 108 mmol/L (ref 101–111)
CO2: 24 mmol/L (ref 22–32)
CREATININE: 0.83 mg/dL (ref 0.44–1.00)
Calcium: 8.5 mg/dL — ABNORMAL LOW (ref 8.9–10.3)
GFR calc Af Amer: >60 mL/min (ref >60)
GFR calc non Af Amer: >60 mL/min (ref >60)
GLUCOSE: 107 mg/dL — AB (ref 65–99)
Potassium: 3.9 mmol/L (ref 3.5–5.1)
Sodium: 140 mmol/L (ref 135–145)

## 2017-07-09 LAB — HIV ANTIBODY (ROUTINE TESTING W REFLEX): HIV Screen 4th Generation wRfx: NONREACTIVE

## 2017-07-09 MED ORDER — ONDANSETRON HCL 4 MG PO TABS: 4.0000 mg | ORAL_TABLET | Freq: Four times a day (QID) | ORAL | 0 refills | Status: DC | PRN

## 2017-07-09 MED ORDER — OSELTAMIVIR PHOSPHATE 75 MG PO CAPS: 75.0000 mg | ORAL_CAPSULE | Freq: Two times a day (BID) | ORAL | 0 refills | Status: AC

## 2017-07-09 NOTE — Progress Notes (Signed)
Discharge home. Home diacharge instruction given, no question verbalized.

## 2017-07-09 NOTE — Progress Notes (Signed)
   Subjective: Ms. Lepp was seen resting in her bed this morning. She was informed that her illness was due to her having the flu. She was informed that she does not currently have a bacterial pneumonia nor does she need any antibiotics at this time. She stated that she did have some nausea and vomiting this morning, but has been able to keep down some ginger ale. Patient contacted after receiving nausea medication and she stated she felt much better and felt ready to go home.  Objective:  Vital signs in last 24 hours: Vitals:   07/08/17 1924 07/08/17 2028 07/09/17 0549 07/09/17 1418  BP:  129/87 119/82 117/76  Pulse:  (!) 107 83 95  Resp:  18 16 18   Temp: 99.6 F (37.6 C) 100.2 F (37.9 C) 98.8 F (37.1 C) 98.4 F (36.9 C)  TempSrc: Oral Oral Oral Oral  SpO2:  97% 93% 100%  Weight:  168 lb 3.4 oz (76.3 kg)    Height:  5' (1.524 m)     Physical Exam  Constitutional: She is oriented to person, place, and time. She appears well-developed and well-nourished.  HENT:  Head: Normocephalic and atraumatic.  Eyes: EOM are normal.  Cardiovascular: Normal rate, regular rhythm and normal heart sounds.  Pulmonary/Chest: Effort normal and breath sounds normal. No respiratory distress.  Abdominal: Soft. Bowel sounds are normal. She exhibits no distension. There is no tenderness.  Musculoskeletal: She exhibits no edema or deformity.  Neurological: She is alert and oriented to person, place, and time.  Skin: Skin is warm and dry.   Assessment/Plan: 57 yo female with PMHx significant for HTN, HLD, GERD, fibromyalgia, and anxiety presenting with chief complaint of flu-like symptoms for 2 days.  Influenza A Patient presented with flu-like symptoms with positive influenza A PCR. Was initially tachycardic and febrile in [facility] ED. Tachycardia improved after receiving 1 liter NS bolus and patient has been afebrile on acetaminophen. CXR showed LLL infiltrate. Patient received ceftriaxone and azithromycin  in [facility] ED and was started on Tamiflu. Antibiotics were discontinued as patient's symptoms were more consistent with influenza rather than CAP. On 2/1, patient experiencing some nausea and vomiting, but had been able to keep some ginger ale down, this resolved with PRN zofran. - AM BMET without significant derangement, AM CBC without leukocytosis and slightly decreased Hgb to 10.9 (dilutional in the setting of IVF administration and concurrent decrease in WBC and platelets) - Continue Tamiflu 75 mg BID for 5 day course - Supportive care with: Tylenol, Mucinex, Zofran, Chloraseptic spray, PRN Zofran - Droplet precautions  HTN, HLD Normotensive to mildly hypertensive while admitted. Has not taken home medications since Monday. Home BP medication regimen includes: Lisinopril-HCTZ 20-25 mg - Will hold BP med for now in setting of normotension, can resume if becomes hypertensive - Continue simvastatin 40 mg qhs - Encouraged increased PO fluid intake  GERD -Continue Protonix 40 mg daily  Dispo: Anticipated discharge in approximately 0-1 day(s).   Neva Seat, MD 07/09/2017, 3:51 PM Pager: [phone]

## 2017-07-09 NOTE — Progress Notes (Signed)
Internal Medicine Attending  Date: 07/09/2017  Patient name: Sherry [name] Medical record number: [id number] Date of birth: May 03, 1961 Age: 57 y.o. Gender: female  I saw and evaluated the patient. I reviewed the resident's note by Dr. Trilby Drummer and I agree with the resident's findings and plans as documented in his progress note.  Please see my H&P dated 07/09/2017 and attached Dr. Revonda Standard H&P dated 07/08/2017 for the specifics of my evaluation, assessment, plan from earlier in the day.

## 2017-07-09 NOTE — Care Management Note (Signed)
Case Management Note  Patient Details  Name: Sherry [name] MRN: [id number] Date of Birth: [birth year]  Subjective/Objective:      Influenza A              Action/Plan: Spoke to pt and states she works part-time at [employer]. Provided pt with a goodrx coupon for Tamiflu at [facility] for $41. Waiting to check on other Rx for home. Pt's dtr states she will be able to look up discounts of goodrx if any additional coupons needed. Will call to arrange follow up with her PCP.   PCP Kathi Ludwig, MD  Expected Discharge Date:  07/09/2017               Expected Discharge Plan:  Home/Self Care  In-House Referral:  NA  Discharge planning Services  Medication Assistance, CM Consult  Post Acute Care Choice:  NA Choice offered to:  NA  DME Arranged:  N/A DME Agency:  NA  HH Arranged:  NA HH Agency:  NA  Status of Service:  Completed, signed off  If discussed at [place] of Stay Meetings, dates discussed:    Additional Comments:  Erenest Rasher, RN 07/09/2017, 4:41 PM

## 2017-07-09 NOTE — Discharge Summary (Signed)
Name: Sherry [name] MRN: [id number] DOB: [birth year] 57 y.o. PCP: Kathi Ludwig, MD  Date of Admission: 07/08/2017  1:56 PM Date of Discharge: 07/09/2017 Attending Physician: Oval Linsey, MD  Discharge Diagnosis:  Principal Problem:   Influenza A Active Problems:   Hypertension   Discharge Medications: Allergies as of 07/09/2017   No Known Allergies     Medication List    STOP taking these medications   promethazine 12.5 MG tablet Commonly known as:  PHENERGAN     TAKE these medications   clonazePAM 0.5 MG tablet Commonly known as:  KLONOPIN Take 0.5 mg by mouth daily as needed for anxiety.   cyclobenzaprine 10 MG tablet Commonly known as:  FLEXERIL Take 1 tablet (10 mg total) by mouth daily as needed for muscle spasms.   ibuprofen 200 MG tablet Commonly known as:  ADVIL,MOTRIN Take 400 mg by mouth every 6 (six) hours as needed for fever or moderate pain.   lisinopril-hydrochlorothiazide 20-25 MG tablet Commonly known as:  PRINZIDE,ZESTORETIC Take 1 tablet by mouth daily.   ondansetron 4 MG tablet Commonly known as:  ZOFRAN Take 1 tablet (4 mg total) by mouth every 6 (six) hours as needed for nausea.   oseltamivir 75 MG capsule Commonly known as:  TAMIFLU Take 1 capsule (75 mg total) by mouth 2 (two) times daily for 9 doses.   pantoprazole 40 MG tablet Commonly known as:  PROTONIX Take 1 tablet (40 mg total) by mouth daily.   pseudoephedrine-acetaminophen 30-500 MG Tabs tablet Commonly known as:  TYLENOL SINUS Take 1 tablet by mouth every 4 (four) hours as needed (flu symptoms).   simvastatin 40 MG tablet Commonly known as:  ZOCOR Take 1 tablet (40 mg total) by mouth at bedtime.   THERAFLU EXPRESSMAX 10-5-325 MG/15ML Liqd Generic drug:  DM-Phenylephrine-Acetaminophen Take 30 mLs by mouth every 8 (eight) hours as needed (flu symptoms).   zolpidem 10 MG tablet Commonly known as:  AMBIEN Take 10 mg by mouth at bedtime.        Disposition and follow-up:   Sherry [name] was discharged from [facility] in Stable condition.  At the hospital follow up visit please address:  1.  Influenza A: Monitor for resolution of symptoms  2. Small retrocardiac LLL infiltrate. Follow up X-ray at ~4 weeks to ensure resolution is recommended.  3. Other: HIV screen performed this admission: NONreactive  2.  Labs / imaging needed at time of follow-up: None, Consider CXR at 4 weeks as above  3.  Pending labs/ test needing follow-up: Blood Culture  Follow-up Appointments: Follow-up Information    Kathi Ludwig, MD. Call in 3 day(s).   Specialty:  Internal Medicine Why:  Please call soon to make an appointment for a day in the nex 2 weeks Contact information: [place] [address] [phone]           Hospital Course by problem list:   1. Influenza A: Patient presented 2 days of non-productive cough, hoarse voice, chills, body aches, subjective fevers, nausea, vomiting (low volume), and decreased PO intake. Influenza A PCR was positive. Patient was initially tachycardic and febrile in [facility] ED. Tachycardia improved after receiving 1 liter NS bolus and patient remained afebrile on acetaminophen. CXR showed LLL infiltrate. Patient received ceftriaxone and azithromycin inWLED and was started on Tamiflu.Antibiotics were discontinued as patient's symptoms were more consistent with influenza rather than CAP. Patient improved overnight with supportive care and nausea resolved with PRN zofran.  Patient discharged on Tamiflu to complete a 5 day course, Zofran PRN for nausea, recommendation to drink plenty of fluids, and continued supportive/symptomatic care as needed.   Discharge Vitals:   BP 117/76 (BP Location: Left Arm)   Pulse 95   Temp 98.4 F (36.9 C) (Oral)   Resp 18   Ht 5' (1.524 m)   Wt 168 lb 3.4 oz (76.3 kg)   SpO2 100%   BMI 32.85 kg/m   Pertinent Labs, Studies,  and Procedures:  CBC Latest Ref Rng & Units 07/09/2017 07/08/2017 10/10/2015  WBC 4.0 - 10.5 K/uL 4.3 6.2 10.5  Hemoglobin 12.0 - 15.0 g/dL 10.9(L) 12.0 12.4  Hematocrit 36.0 - 46.0 % 32.9(L) 35.7(L) 37.4  Platelets 150 - 400 K/uL 368 439(H) 459(H)   BMP Latest Ref Rng & Units 07/09/2017 07/08/2017 04/08/2017  Glucose 65 - 99 mg/dL 107(H) 109(H) 140(H)  BUN 6 - 20 mg/dL 7 10 8   Creatinine 0.44 - 1.00 mg/dL 0.83 0.89 0.90  BUN/Creat Ratio 9 - 23 - - 9  Sodium 135 - 145 mmol/L 140 135 141  Potassium 3.5 - 5.1 mmol/L 3.9 3.9 4.2  Chloride 101 - 111 mmol/L 108 100(L) 99  CO2 22 - 32 mmol/L 24 26 25   Calcium 8.9 - 10.3 mg/dL 8.5(L) 9.1 10.0   CXR: IMPRESSION: Left lower lobe infiltrate. Followup PA and lateral chest X-ray is recommended in 3-4 weeks following trial of antibiotic therapy to ensure resolution and exclude underlying malignancy.   Discharge Instructions: Discharge Instructions    Call MD for:  persistant dizziness or light-headedness   Complete by:  As directed    Call MD for:  persistant nausea and vomiting   Complete by:  As directed    Diet - low sodium heart healthy   Complete by:  As directed    Discharge instructions   Complete by:  As directed    Thank you for allowing [place] to care for you.  Your symptoms were caused by infection with the Flu virus. - Please drink plenty of fluids  - Please take the prescribed Tamiflu for a total of 5 days - You have been provided a prescription for Zofran for nausea (sent to [facility] on [address]) - Continue to take tylenol as needed for fevers - Continue supportive care with over the counter medications if these are effective for you - Please follow up with your primary care provider in the next 2 weeks   Increase activity slowly   Complete by:  As directed       Signed: Neva Seat, MD 07/09/2017, 4:53 PM   Pager: [phone]

## 2017-07-13 LAB — CULTURE, BLOOD (ROUTINE X 2)
CULTURE: NO GROWTH
Culture: NO GROWTH
SPECIAL REQUESTS: ADEQUATE
Special Requests: ADEQUATE

## 2017-11-21 ENCOUNTER — Encounter (HOSPITAL_COMMUNITY): Payer: Self-pay | Admitting: Emergency Medicine

## 2017-11-21 ENCOUNTER — Emergency Department (HOSPITAL_COMMUNITY)
Admission: EM | Admit: 2017-11-21 | Discharge: 2017-11-21 | Disposition: A | Discharge status: Home / Self Care | Payer: Self-pay | Attending: Emergency Medicine | Admitting: Emergency Medicine

## 2017-11-21 DIAGNOSIS — I10 Essential (primary) hypertension: Secondary | ICD-10-CM | POA: Insufficient documentation

## 2017-11-21 DIAGNOSIS — M791 Myalgia, unspecified site: Secondary | ICD-10-CM | POA: Insufficient documentation

## 2017-11-21 DIAGNOSIS — Z79899 Other long term (current) drug therapy: Secondary | ICD-10-CM | POA: Insufficient documentation

## 2017-11-21 MED ORDER — KETOROLAC TROMETHAMINE 60 MG/2ML IM SOLN
15.0000 mg | Freq: Once | INTRAMUSCULAR | Status: AC
  Administered 2017-11-21: 15 mg via INTRAMUSCULAR
  Filled 2017-11-21: qty 2

## 2017-11-21 MED ORDER — MORPHINE SULFATE 15 MG PO TABS: 15.0000 mg | ORAL_TABLET | ORAL | Status: DC | PRN

## 2017-11-21 MED ORDER — DIAZEPAM 5 MG PO TABS
5.0000 mg | ORAL_TABLET | Freq: Once | ORAL | Status: AC
  Administered 2017-11-21: 5 mg via ORAL
  Filled 2017-11-21: qty 1

## 2017-11-21 MED ORDER — ACETAMINOPHEN 500 MG PO TABS
1000.0000 mg | ORAL_TABLET | Freq: Once | ORAL | Status: AC
  Administered 2017-11-21: 1000 mg via ORAL
  Filled 2017-11-21: qty 2

## 2017-11-21 NOTE — ED Triage Notes (Signed)
Pt c/o generalized body aches that is always there due to fibromyalgia but worse since yesterday.

## 2017-11-21 NOTE — ED Notes (Signed)
Pt offered hallway bed to be seen faster, pt stated that she wanted to wait for a private room to come open.

## 2017-11-21 NOTE — ED Notes (Signed)
Patient verbalized understanding of discharge instructions, no questions. Patient ambulated out of ED with steady gait in no distress.  

## 2017-11-21 NOTE — Discharge Instructions (Signed)
Please follow-up with your family physician.  Please discuss with them that you are having muscle aches and this could be caused by your cholesterol medicine and see if they want to change it.

## 2017-11-21 NOTE — ED Provider Notes (Signed)
[facility] DEPT Provider Note   CSN: [id number] Arrival date & time: 11/21/17  6269     History   Chief Complaint Chief Complaint  Patient presents with  . bodyaches    HPI Sherry [name] is a 57 y.o. female.  57 yo F with a cc of diffuse myalgias.  Mostly in the large muscles.  Going on for the past couple of days.  Patient has hx of fibromyalgia and thinks this is the same.  Denies fevers, chills, cough, congestion, vomiting, diarrhea, abdominal pain.  Is on a statin.   The history is provided by the patient.  Illness  This is a new problem. The current episode started 2 days ago. The problem occurs constantly. The problem has not changed since onset.Pertinent negatives include no chest pain, no headaches and no shortness of breath. Nothing aggravates the symptoms. Nothing relieves the symptoms. She has tried nothing for the symptoms. The treatment provided no relief.    Past Medical History:  Diagnosis Date  . Acid reflux disease   . Anginal pain ([place])    last time 04/18/13 in afternoon; referred to [name]  . Anxiety   . Cancer [facility])    uterine- had hysterectomy  . Chronic pain   . Depression   . Fibromyalgia   . History of blood transfusion 1981   childbirth  . History of kidney stones    passed  . Hypertension   . Migraine    last one was 04/18/13  . Sleep apnea   . Trichomonas contact, treated     Patient Active Problem List   Diagnosis Date Noted  . Influenza A 07/08/2017  . Chronic pain 06/19/2014  . Unspecified sleep apnea 05/18/2013  . Calcaneus fracture, left 05/16/2013  . Lumbar facet arthropathy 03/10/2013  . Left wrist pain 03/10/2013  . Headache(784.0) 12/27/2012  . Foot pain 07/07/2012  . Cough 09/25/2011  . High cholesterol 09/10/2011  . Borderline diabetes 09/10/2011  . Obesity 09/02/2011  . Chest pain 04/07/2011  . Fibromyalgia 03/24/2011  . Anxiety 03/24/2011  . Sleep disorder 03/24/2011    . Hypertension 03/24/2011  . GERD 01/05/2007    Past Surgical History:  Procedure Laterality Date  . CHOLECYSTECTOMY     2008  . ORIF CALCANEOUS FRACTURE Left 05/16/2013   Procedure: OPEN REDUCTION INTERNAL FIXATION (ORIF) LEFT CALCANEOUS FRACTURE;  Surgeon: Rozanna Box, MD;  Location: [facility];  Service: Orthopedics;  Laterality: Left;  . TOTAL ABDOMINAL HYSTERECTOMY     1986  . WISDOM TOOTH EXTRACTION       OB History    Gravida  2   Para  2   Term  2   Preterm      AB      Living  2     SAB      TAB      Ectopic      Multiple      Live Births               Home Medications    Prior to Admission medications   Medication Sig Start Date End Date Taking? Authorizing Provider  clonazePAM (KLONOPIN) 0.5 MG tablet Take 0.5 mg by mouth daily as needed for anxiety.  11/29/14   [provider]  cyclobenzaprine (FLEXERIL) 10 MG tablet Take 1 tablet (10 mg total) by mouth daily as needed for muscle spasms. 04/08/17   Kathi Ludwig, MD  DM-Phenylephrine-Acetaminophen [facility]) 10-5-325 MG/15ML  LIQD Take 30 mLs by mouth every 8 (eight) hours as needed (flu symptoms).    [provider]  ibuprofen (ADVIL,MOTRIN) 200 MG tablet Take 400 mg by mouth every 6 (six) hours as needed for fever or moderate pain.     [provider]  lisinopril-hydrochlorothiazide (PRINZIDE,ZESTORETIC) 20-25 MG tablet Take 1 tablet by mouth daily. 04/08/17   Kathi Ludwig, MD  ondansetron (ZOFRAN) 4 MG tablet Take 1 tablet (4 mg total) by mouth every 6 (six) hours as needed for nausea. 07/09/17   Neva Seat, MD  pantoprazole (PROTONIX) 40 MG tablet Take 1 tablet (40 mg total) by mouth daily. 04/08/17   Kathi Ludwig, MD  pseudoephedrine-acetaminophen (TYLENOL SINUS) 30-500 MG TABS tablet Take 1 tablet by mouth every 4 (four) hours as needed (flu symptoms).    [provider]  simvastatin (ZOCOR) 40 MG tablet Take 1 tablet (40 mg  total) by mouth at bedtime. 04/08/17   Kathi Ludwig, MD  zolpidem (AMBIEN) 10 MG tablet Take 10 mg by mouth at bedtime.  11/29/14   [provider]    Family History Family History  Problem Relation Age of Onset  . Diabetes Mother   . Hypertension Mother   . Depression Mother   . Diabetes Sister   . Hypertension Father   . Cancer Father        Prostate  . Alcohol abuse Father   . Cancer Brother        Prostate    Social History Social History   Tobacco Use  . Smoking status: Never Smoker  . Smokeless tobacco: Never Used  Substance Use Topics  . Alcohol use: No  . Drug use: No     Allergies   Patient has no known allergies.   Review of Systems Review of Systems  Constitutional: Negative for chills and fever.  HENT: Negative for congestion and rhinorrhea.   Eyes: Negative for redness and visual disturbance.  Respiratory: Negative for shortness of breath and wheezing.   Cardiovascular: Negative for chest pain and palpitations.  Gastrointestinal: Negative for nausea and vomiting.  Genitourinary: Negative for dysuria and urgency.  Musculoskeletal: Positive for myalgias. Negative for arthralgias.  Skin: Negative for pallor and wound.  Neurological: Negative for dizziness and headaches.     Physical Exam Updated Vital Signs BP (!) 153/82 (BP Location: Right Arm)   Pulse 75   Temp 98.2 F (36.8 C) (Oral)   Resp 16   Ht 5' (1.524 m)   SpO2 100%   BMI 32.85 kg/m   Physical Exam  Constitutional: She is oriented to person, place, and time. She appears well-developed and well-nourished. No distress.  HENT:  Head: Normocephalic and atraumatic.  Eyes: Pupils are equal, round, and reactive to light. EOM are normal.  Neck: Normal range of motion. Neck supple.  Cardiovascular: Normal rate and regular rhythm. Exam reveals no gallop and no friction rub.  No murmur heard. Pulmonary/Chest: Effort normal. She has no wheezes. She has no rales.  Abdominal:  Soft. She exhibits no distension and no mass. There is no tenderness. There is no guarding.  Musculoskeletal: She exhibits no edema or tenderness.  Neurological: She is alert and oriented to person, place, and time.  Skin: Skin is warm and dry. She is not diaphoretic.  Psychiatric: She has a normal mood and affect. Her behavior is normal.  Nursing note and vitals reviewed.    ED Treatments / Results  Labs (all labs ordered are listed, but only abnormal  results are displayed) Labs Reviewed - No data to display  EKG None  Radiology No results found.  Procedures Procedures (including critical care time)  Medications Ordered in ED Medications  morphine (MSIR) tablet 15 mg (has no administration in time range)  acetaminophen (TYLENOL) tablet 1,000 mg (1,000 mg Oral Given 11/21/17 1114)  ketorolac (TORADOL) injection 15 mg (15 mg Intramuscular Given 11/21/17 1109)  diazepam (VALIUM) tablet 5 mg (5 mg Oral Given 11/21/17 1109)     Initial Impression / Assessment and Plan / ED Course  I have reviewed the triage vital signs and the nursing notes.  Pertinent labs & imaging results that were available during my care of the patient were reviewed by me and considered in my medical decision making (see chart for details).     57 yo F with a cc of diffuse myalgias.  Feels like her prior bouts of fibromyalgia.  With diffuse myalgias the patient may have a reaction to her statin.  I suggested that she follow-up with her family doctor.  Exam with no infectious signs.  Do not feel the need a laboratory work-up.  Discharge home.  1:40 PM:  I have discussed the diagnosis/risks/treatment options with the patient and family and believe the pt to be eligible for discharge home to follow-up with PCP. We also discussed returning to the ED immediately if new or worsening sx occur. We discussed the sx which are most concerning (e.g., sudden worsening pain, fever, inability to tolerate by mouth) that  necessitate immediate return. Medications administered to the patient during their visit and any new prescriptions provided to the patient are listed below.  Medications given during this visit Medications  morphine (MSIR) tablet 15 mg (has no administration in time range)  acetaminophen (TYLENOL) tablet 1,000 mg (1,000 mg Oral Given 11/21/17 1114)  ketorolac (TORADOL) injection 15 mg (15 mg Intramuscular Given 11/21/17 1109)  diazepam (VALIUM) tablet 5 mg (5 mg Oral Given 11/21/17 1109)      The patient appears reasonably screen and/or stabilized for discharge and I doubt any other medical condition or other [facility] requiring further screening, evaluation, or treatment in the ED at this time prior to discharge.    Final Clinical Impressions(s) / ED Diagnoses   Final diagnoses:  Myalgia    ED Discharge Orders    None       Deno Etienne, DO 11/21/17 1340

## 2018-03-25 ENCOUNTER — Encounter: Payer: Self-pay | Admitting: Internal Medicine

## 2018-04-13 ENCOUNTER — Encounter: Payer: Self-pay | Admitting: Internal Medicine

## 2018-04-26 NOTE — Progress Notes (Deleted)
   CC: ***  HPI:Ms.RIVERLYN KIZZIAH is a 57 y.o. female who presents for evaluation of ***. Please see individual problem based A/P for details.  ***:  Plan: ***  ***:  Plan:  ***  PHQ-9: Based on the patients    Office Visit from 03/30/2013 in [place]  PHQ-9 Total Score  8     score we have ***.  Past Medical History:  Diagnosis Date  . Acid reflux disease   . Anginal pain ([place])    last time 04/18/13 in afternoon; referred to [name]  . Anxiety   . Cancer [facility])    uterine- had hysterectomy  . Chronic pain   . Depression   . Fibromyalgia   . History of blood transfusion 1981   childbirth  . History of kidney stones    passed  . Hypertension   . Migraine    last one was 04/18/13  . Sleep apnea   . Trichomonas contact, treated    Review of Systems:  ***  Physical Exam: There were no vitals filed for this visit. General: *** HEENT: *** Cardio: *** Pulmonary: *** MSK: ***  Assessment & Plan:   See Encounters Tab for problem based charting.  Patient {GC/GE:[id number]::"discussed with","seen with"} Dr. {NAMES:[id number]::"Butcher","Granfortuna","E. Hoffman","Klima","Mullen","Narendra","Raines","Vincent"}

## 2018-04-27 ENCOUNTER — Emergency Department (HOSPITAL_COMMUNITY): Payer: Self-pay

## 2018-04-27 ENCOUNTER — Encounter: Payer: Self-pay | Admitting: Internal Medicine

## 2018-04-27 ENCOUNTER — Emergency Department (HOSPITAL_COMMUNITY)
Admission: EM | Admit: 2018-04-27 | Discharge: 2018-04-27 | Disposition: A | Discharge status: Home / Self Care | Payer: Self-pay | Attending: Emergency Medicine | Admitting: Emergency Medicine

## 2018-04-27 ENCOUNTER — Ambulatory Visit: Payer: Self-pay

## 2018-04-27 ENCOUNTER — Encounter (HOSPITAL_COMMUNITY): Payer: Self-pay | Admitting: Emergency Medicine

## 2018-04-27 ENCOUNTER — Other Ambulatory Visit: Payer: Self-pay

## 2018-04-27 DIAGNOSIS — G8929 Other chronic pain: Secondary | ICD-10-CM | POA: Insufficient documentation

## 2018-04-27 DIAGNOSIS — M25572 Pain in left ankle and joints of left foot: Secondary | ICD-10-CM

## 2018-04-27 DIAGNOSIS — M25562 Pain in left knee: Secondary | ICD-10-CM | POA: Insufficient documentation

## 2018-04-27 DIAGNOSIS — M791 Myalgia, unspecified site: Secondary | ICD-10-CM

## 2018-04-27 MED ORDER — NAPROXEN 500 MG PO TABS: 500.0000 mg | ORAL_TABLET | Freq: Two times a day (BID) | ORAL | 0 refills | Status: DC

## 2018-04-27 MED ORDER — HYDROCODONE-ACETAMINOPHEN 5-325 MG PO TABS
1.0000 | ORAL_TABLET | Freq: Once | ORAL | Status: AC
Start: 2018-04-27 — End: 2018-04-27
  Administered 2018-04-27: 1 via ORAL
  Filled 2018-04-27: qty 1

## 2018-04-27 MED ORDER — METHOCARBAMOL 500 MG PO TABS: 500.0000 mg | ORAL_TABLET | Freq: Two times a day (BID) | ORAL | 0 refills | Status: DC

## 2018-04-27 MED ORDER — LISINOPRIL-HYDROCHLOROTHIAZIDE 20-25 MG PO TABS: 1.0000 | ORAL_TABLET | Freq: Every day | ORAL | 4 refills | Status: DC

## 2018-04-27 MED ORDER — LISINOPRIL 20 MG PO TABS
20.0000 mg | ORAL_TABLET | Freq: Once | ORAL | Status: AC
  Administered 2018-04-27: 20 mg via ORAL
  Filled 2018-04-27: qty 1

## 2018-04-27 MED ORDER — KETOROLAC TROMETHAMINE 15 MG/ML IJ SOLN
15.0000 mg | Freq: Once | INTRAMUSCULAR | Status: AC
  Administered 2018-04-27: 15 mg via INTRAMUSCULAR
  Filled 2018-04-27: qty 1

## 2018-04-27 MED ORDER — METHOCARBAMOL 500 MG PO TABS
500.0000 mg | ORAL_TABLET | Freq: Once | ORAL | Status: AC
  Administered 2018-04-27: 500 mg via ORAL
  Filled 2018-04-27: qty 1

## 2018-04-27 NOTE — ED Provider Notes (Signed)
[facility] DEPT Provider Note   CSN: [id number] Arrival date & time: 04/27/18  1844     History   Chief Complaint Chief Complaint  Patient presents with  . Generalized Body Aches  . Foot Pain    HPI Sherry [name] is a 57 y.o. female with a past medical history significant for chronic pain, fibromyalgia who presents for evaluation of diffuse myalgias.  Patient states this is consistent with her normal "fibromyalgia flares."  Patient states this is been occurring for the last 4 days.  Denies fever, chills, nausea, vomiting, chest pain, shortness of breath, cough, upper respiratory symptoms, abdominal pain.  Denies being on a statin.  Denies any increase in physical exertional activities.  Denies aggravating or alleviating factors.  Patient states she was previously on "muscle relaxers and pain meds for my fibromyalgia from the pain clinic."  Patient states she has been out of these for the last 3 months because she did not have primary care follow-up.  Patient states she did have an appointment today to establish care with Zacarias Pontes, PCP, however patient states she did not have her copayment so they would not see her.  Patient admits to left ankle pain x1 day.  Denies injury or trauma.  Patient states she did previously have surgery on this ankle and they had to remove the hardware secondary to increased pain, per patient.  This surgery occurred "many years ago."  Patient states she has occasional flares of pain to this area.  Denies edema, erythema, warmth to this area. Denies history of gout or previous septic joint.  Patient denies influenza vaccine.,  However states he does not have a refill of blood pressure medicines.  Patient states she was taking Prinzide.  History obtained from patient.  No interpreter was used.  HPI  Past Medical History:  Diagnosis Date  . Acid reflux disease   . Anginal pain ([place])    last time 04/18/13 in afternoon; referred  to [name]  . Anxiety   . Cancer [facility])    uterine- had hysterectomy  . Chronic pain   . Depression   . Fibromyalgia   . History of blood transfusion 1981   childbirth  . History of kidney stones    passed  . Hypertension   . Migraine    last one was 04/18/13  . Sleep apnea   . Trichomonas contact, treated     Patient Active Problem List   Diagnosis Date Noted  . Influenza A 07/08/2017  . Chronic pain 06/19/2014  . Unspecified sleep apnea 05/18/2013  . Calcaneus fracture, left 05/16/2013  . Lumbar facet arthropathy 03/10/2013  . Left wrist pain 03/10/2013  . Headache(784.0) 12/27/2012  . Foot pain 07/07/2012  . Cough 09/25/2011  . High cholesterol 09/10/2011  . Borderline diabetes 09/10/2011  . Obesity 09/02/2011  . Chest pain 04/07/2011  . Fibromyalgia 03/24/2011  . Anxiety 03/24/2011  . Sleep disorder 03/24/2011  . Hypertension 03/24/2011  . GERD 01/05/2007    Past Surgical History:  Procedure Laterality Date  . CHOLECYSTECTOMY     2008  . ORIF CALCANEOUS FRACTURE Left 05/16/2013   Procedure: OPEN REDUCTION INTERNAL FIXATION (ORIF) LEFT CALCANEOUS FRACTURE;  Surgeon: Rozanna Box, MD;  Location: [facility];  Service: Orthopedics;  Laterality: Left;  . TOTAL ABDOMINAL HYSTERECTOMY     1986  . WISDOM TOOTH EXTRACTION       OB History    Gravida  2   Para  2   Term  2   Preterm      AB      Living  2     SAB      TAB      Ectopic      Multiple      Live Births               Home Medications    Prior to Admission medications   Medication Sig Start Date End Date Taking? Authorizing Provider  clonazePAM (KLONOPIN) 0.5 MG tablet Take 0.5 mg by mouth daily as needed for anxiety.  11/29/14   [provider]  cyclobenzaprine (FLEXERIL) 10 MG tablet Take 1 tablet (10 mg total) by mouth daily as needed for muscle spasms. 04/08/17   Kathi Ludwig, MD  DM-Phenylephrine-Acetaminophen [facility]) 10-5-325 MG/15ML LIQD  Take 30 mLs by mouth every 8 (eight) hours as needed (flu symptoms).    [provider]  ibuprofen (ADVIL,MOTRIN) 200 MG tablet Take 400 mg by mouth every 6 (six) hours as needed for fever or moderate pain.     [provider]  lisinopril-hydrochlorothiazide (PRINZIDE,ZESTORETIC) 20-25 MG tablet Take 1 tablet by mouth daily. 04/27/18   Rally Ouch A, PA-C  methocarbamol (ROBAXIN) 500 MG tablet Take 1 tablet (500 mg total) by mouth 2 (two) times daily. 04/27/18   Tafari Humiston A, PA-C  naproxen (NAPROSYN) 500 MG tablet Take 1 tablet (500 mg total) by mouth 2 (two) times daily. 04/27/18   Isobelle Tuckett A, PA-C  ondansetron (ZOFRAN) 4 MG tablet Take 1 tablet (4 mg total) by mouth every 6 (six) hours as needed for nausea. 07/09/17   Neva Seat, MD  pantoprazole (PROTONIX) 40 MG tablet Take 1 tablet (40 mg total) by mouth daily. 04/08/17   Kathi Ludwig, MD  pseudoephedrine-acetaminophen (TYLENOL SINUS) 30-500 MG TABS tablet Take 1 tablet by mouth every 4 (four) hours as needed (flu symptoms).    [provider]  simvastatin (ZOCOR) 40 MG tablet Take 1 tablet (40 mg total) by mouth at bedtime. 04/08/17   Kathi Ludwig, MD  zolpidem (AMBIEN) 10 MG tablet Take 10 mg by mouth at bedtime.  11/29/14   [provider]    Family History Family History  Problem Relation Age of Onset  . Diabetes Mother   . Hypertension Mother   . Depression Mother   . Diabetes Sister   . Hypertension Father   . Cancer Father        Prostate  . Alcohol abuse Father   . Cancer Brother        Prostate    Social History Social History   Tobacco Use  . Smoking status: Never Smoker  . Smokeless tobacco: Never Used  Substance Use Topics  . Alcohol use: No  . Drug use: No     Allergies   Patient has no known allergies.   Review of Systems Review of Systems  Constitutional: Negative.   HENT: Negative.   Respiratory: Negative.   Cardiovascular:  Negative.   Gastrointestinal: Negative.   Genitourinary: Negative.   Musculoskeletal: Positive for arthralgias and myalgias. Negative for back pain, gait problem, joint swelling, neck pain and neck stiffness.       Left ankle pain.  Skin: Negative.   Neurological: Negative.   All other systems reviewed and are negative.    Physical Exam Updated Vital Signs BP (!) 177/115 Comment: Pt reports not having her BP medication for about a month now  Pulse  82   Temp 98.9 F (37.2 C) (Oral)   Resp 18   SpO2 98%   Physical Exam  Constitutional: She appears well-developed and well-nourished. No distress.  HENT:  Head: Atraumatic.  Mouth/Throat: Oropharynx is clear and moist.  Eyes: Pupils are equal, round, and reactive to light.  Neck: Normal range of motion.  Cardiovascular: Normal rate, regular rhythm, normal heart sounds and intact distal pulses. Exam reveals no gallop and no friction rub.  No murmur heard. Pulmonary/Chest: Effort normal and breath sounds normal. No stridor. No respiratory distress. She has no wheezes. She has no rales. She exhibits no tenderness.  Abdominal: Soft. Bowel sounds are normal. She exhibits no distension and no mass. There is no tenderness. There is no rebound and no guarding. No hernia.  Musculoskeletal: Normal range of motion.  Patient with diffuse musculoskeletal tenderness to palpation to greater than 15 spots on body. Mild tenderness palpation to left lateral malleolus.  Full range of motion bilateral upper and lower extremities.  5/5 strength bilateral upper and lower extremities.  Neurological: She is alert. She has normal strength and normal reflexes. She displays no atrophy and no tremor. No sensory deficit.  Intact sensation to sharp and dull bilateral upper and lower extremities.  Skin: Skin is warm and dry. She is not diaphoretic.  No edema, erythema, ecchymosis or warmth.  No rashes or lesions.  Psychiatric: She has a normal mood and affect.    Nursing note and vitals reviewed.    ED Treatments / Results  Labs (all labs ordered are listed, but only abnormal results are displayed) Labs Reviewed - No data to display  EKG None  Radiology Dg Ankle Complete Left  Result Date: 04/27/2018 CLINICAL DATA:  Left ankle pain. History of previous surgery. EXAM: LEFT ANKLE COMPLETE - 3+ VIEW COMPARISON:  03/25/2015 FINDINGS: No acute fracture or dislocation. Healed left calcaneal fracture with interval removal of the orthopedic hardware. Severe osteoarthritis of the subtalar joints with possible osseous fusion across the posterior subtalar joint. Ankle mortise is intact. No soft tissue abnormality. IMPRESSION: 1. No acute osseous injury of the left ankle. 2. Healed left calcaneal fracture with interval removal of the orthopedic hardware. 3. Severe osteoarthritis of the subtalar joints with possible osseous fusion across the posterior subtalar joint. Electronically Signed   By: Kathreen Devoid   On: 04/27/2018 20:47    Procedures Procedures (including critical care time)  Medications Ordered in ED Medications  ketorolac (TORADOL) 15 MG/ML injection 15 mg (15 mg Intramuscular Given 04/27/18 2019)  methocarbamol (ROBAXIN) tablet 500 mg (500 mg Oral Given 04/27/18 2017)  HYDROcodone-acetaminophen (NORCO/VICODIN) 5-325 MG per tablet 1 tablet (1 tablet Oral Given 04/27/18 2140)  lisinopril (PRINIVIL,ZESTRIL) tablet 20 mg (20 mg Oral Given 04/27/18 2153)     Initial Impression / Assessment and Plan / ED Course  I have reviewed the triage vital signs and the nursing notes.  Pertinent labs & imaging results that were available during my care of the patient were reviewed by me and considered in my medical decision making (see chart for details).  57 year old female who appears otherwise well presents for evaluation of generalized body aches and pains and left ankle pain.  Afebrile, nonseptic, non-ill-appearing.  Has history of fibromyalgia where  patient states "this feels just like my fibromyalgia flare."  Patient states she is taking muscle relaxers and " pain meds."  However has not had this for last 3 months.  Discussed with patient that we do not provide chronic  pain management from the emergency department.  Patient had appointment to establish care with Zacarias Pontes, PCP, however did not have her copayment and would not be seen at that time.  Admits to left ankle pain.  Normal musculoskeletal exam.  Patient is neurovascularly intact.  She does have greater than 15 areas of tenderness to body on exam.  No edema, erythema, ecchymosis or warmth.  Low suspicion for septic joint, gouty joint, hemarthrosis as cause of left ankle pain.  Will obtain plain film left ankle to rule out fracture dislocation as well as provide pain management.  I do not feel patient needs lab work at this time.  Low suspicion for infectious etiology of patient's symptoms.  Patient is not currently on a statin.  On re-evalaution patient with improvement in pain.  Plain film negative for fracture dislocation, however does show severe osteoarthritis in left ankle.  Discussed follow-up with PCP for reevaluation.  Patient is requesting refill on her blood pressure medicine at this time.  She is mildly hypertensive department.  Will give 1 dose of her blood pressure medicine and have her follow-up PCP.  Denies chest pain, headache, vision changes, shortness of breath.  Discussed strict return precautions with patient.  Patient is hemodynamically stable and appropriate for DC home at this time.  Low suspicion for emergent pathology causing patient's symptoms at this time.  Patient voiced understanding of return precautions and is agreeable for follow-up.    Final Clinical Impressions(s) / ED Diagnoses   Final diagnoses:  Myalgia  Chronic pain of left ankle    ED Discharge Orders         Ordered    naproxen (NAPROSYN) 500 MG tablet  2 times daily     04/27/18 2145     methocarbamol (ROBAXIN) 500 MG tablet  2 times daily     04/27/18 2145    lisinopril-hydrochlorothiazide (PRINZIDE,ZESTORETIC) 20-25 MG tablet  Daily     04/27/18 2145           Joye Wesenberg A, PA-C 04/27/18 2207    Davonna Belling, MD 04/27/18 2328

## 2018-04-27 NOTE — Discharge Instructions (Addendum)
Evaluated today for fibromyalgia flare as well as left ankle pain.  Your left ankle x-ray did not show any evidence of fracture dislocation.  Did show that you have severe osteoarthritis in your left ankle.  Will prescribe you naproxen as well as Robaxin for your fibromyalgia pain.  Also refilled your blood pressure medicines that you have been out of.  Please follow-up with a PCP for reevaluation.  Return to the ED for any worsening symptoms.

## 2018-04-27 NOTE — ED Triage Notes (Signed)
Patient c/o generalized body aches x3 days. States "I know it is my fibromyalgia flaring up." Patient also c/o left foot pain x3 days. Denies recent injury. Reports hx chronic foot pain from "accident." Cap refill < 3 seconds. Pulse present in left foot.

## 2018-05-09 ENCOUNTER — Other Ambulatory Visit: Payer: Self-pay

## 2018-05-09 ENCOUNTER — Ambulatory Visit: Payer: Self-pay

## 2018-05-09 ENCOUNTER — Encounter: Payer: Self-pay | Admitting: Internal Medicine

## 2018-05-09 ENCOUNTER — Ambulatory Visit (INDEPENDENT_AMBULATORY_CARE_PROVIDER_SITE_OTHER): Payer: Self-pay | Admitting: Internal Medicine

## 2018-05-09 VITALS — BP 163/98 | HR 85 | Temp 98.1°F | Ht 60.0 in | Wt 176.4 lb

## 2018-05-09 DIAGNOSIS — M19172 Post-traumatic osteoarthritis, left ankle and foot: Secondary | ICD-10-CM

## 2018-05-09 DIAGNOSIS — M25572 Pain in left ankle and joints of left foot: Secondary | ICD-10-CM

## 2018-05-09 DIAGNOSIS — F329 Major depressive disorder, single episode, unspecified: Secondary | ICD-10-CM

## 2018-05-09 DIAGNOSIS — Z8781 Personal history of (healed) traumatic fracture: Secondary | ICD-10-CM

## 2018-05-09 DIAGNOSIS — R0789 Other chest pain: Secondary | ICD-10-CM

## 2018-05-09 DIAGNOSIS — F419 Anxiety disorder, unspecified: Secondary | ICD-10-CM

## 2018-05-09 DIAGNOSIS — Z79899 Other long term (current) drug therapy: Secondary | ICD-10-CM

## 2018-05-09 DIAGNOSIS — R079 Chest pain, unspecified: Secondary | ICD-10-CM

## 2018-05-09 DIAGNOSIS — M79672 Pain in left foot: Secondary | ICD-10-CM

## 2018-05-09 DIAGNOSIS — G8929 Other chronic pain: Secondary | ICD-10-CM

## 2018-05-09 DIAGNOSIS — I10 Essential (primary) hypertension: Secondary | ICD-10-CM

## 2018-05-09 DIAGNOSIS — F32A Depression, unspecified: Secondary | ICD-10-CM

## 2018-05-09 MED ORDER — LISINOPRIL-HYDROCHLOROTHIAZIDE 20-25 MG PO TABS: 1.0000 | ORAL_TABLET | Freq: Every day | ORAL | 1 refills | Status: DC

## 2018-05-09 MED ORDER — PANTOPRAZOLE SODIUM 40 MG PO TBEC: 40.0000 mg | DELAYED_RELEASE_TABLET | Freq: Every day | ORAL | 1 refills | Status: DC

## 2018-05-09 MED ORDER — DICLOFENAC SODIUM 1 % TD GEL: 2.0000 g | Freq: Four times a day (QID) | TRANSDERMAL | 1 refills | Status: DC

## 2018-05-09 MED ORDER — SIMVASTATIN 40 MG PO TABS: 40.0000 mg | ORAL_TABLET | Freq: Every day | ORAL | 1 refills | Status: DC

## 2018-05-09 MED FILL — PANTOPRAZOLE SOD DR 40 MG T: 40 | 30 days supply | Qty: 30 | Fill #0

## 2018-05-09 MED FILL — SIMVASTATIN 40 MG TABLET: 40 | 30 days supply | Qty: 30 | Fill #0

## 2018-05-09 MED FILL — LISINOPRIL-HCTZ 20-25 MG TA: 20-25 | 30 days supply | Qty: 30 | Fill #0

## 2018-05-09 MED FILL — DICLOFENAC SODIUM 1% GEL: 1 | 13 days supply | Qty: 100 | Fill #0

## 2018-05-09 NOTE — Assessment & Plan Note (Signed)
History of present illness: Patient states she had a fall in 2015 and fractured her calcaneus bone.  She states she had surgery on it by Dr. Marcelino Scot. She had hardware removed in 2017. She states that she has had increased pain in her ankle for the past month with swelling. Denies any trauma or falls recently. Patient has tried ibuprofen with little benefit.    Assessment: Chronic left ankle pain Patient was recently seen in the ED on 11/24 for myalgia and left ankle pain. She had a x-ray of her left ankle that showed severe osteoarthritis of the subtalar joints and a healed left calcaneal fracture with interval removal of the orthopedic hardware.  Patient has sensation in left foot with good pedal pulse. Dorsi flexion, plantar flexion, and inversion without any acute abnormalities.  Patient had difficulty with eversion of the left foot.  At this time will try Voltaren gel and refer back to orthopedic surgery for any further assessment of current issue.  Plan -Voltaren gel -Referral to orthopedic surgery

## 2018-05-09 NOTE — Progress Notes (Signed)
   CC: Chronic left ankle pain  HPI:  [name] is a 57 y.o. female with history noted below that presents to the acute care clinic for chronic left ankle pain. Please see problem based charting for the status of patient's chronic medical conditions.  Past Medical History:  Diagnosis Date  . Acid reflux disease   . Anginal pain ([place])    last time 04/18/13 in afternoon; referred to [name]  . Anxiety   . Cancer [facility])    uterine- had hysterectomy  . Chronic pain   . Depression   . Fibromyalgia   . History of blood transfusion 1981   childbirth  . History of kidney stones    passed  . Hypertension   . Migraine    last one was 04/18/13  . Sleep apnea   . Trichomonas contact, treated     Review of Systems:  Review of Systems  Constitutional: Negative for chills and fever.  Respiratory: Negative for cough, sputum production and shortness of breath.   Cardiovascular: Positive for chest pain. Negative for palpitations, orthopnea and leg swelling.  Musculoskeletal: Negative for falls and myalgias.     Physical Exam:  Vitals:   05/09/18 0941  BP: (!) 163/98  Pulse: 85  Temp: 98.1 F (36.7 C)  TempSrc: Oral  SpO2: 98%  Weight: 176 lb 6.4 oz (80 kg)  Height: 5' (1.524 m)   Physical Exam  Constitutional: She is well-developed, well-nourished, and in no distress.  Cardiovascular: Normal rate, regular rhythm and normal heart sounds. Exam reveals no gallop and no friction rub.  No murmur heard. Pulmonary/Chest: Effort normal and breath sounds normal. No respiratory distress. She has no wheezes. She has no rales.  Musculoskeletal: She exhibits no edema or deformity.  5/5 strength dorsiflexion and plantar flexion of left foot No eversion of left foot inversion of foot 5/5 strength  Skin: Skin is warm and dry.     Assessment & Plan:   See encounters tab for problem based medical decision making.   Patient discussed with Dr. Dareen Piano

## 2018-05-09 NOTE — Assessment & Plan Note (Addendum)
Assessment: Atypical chest pain Not having active chest pain.  Patient states that since 07-Apr-2023, after her brother's death she's been experiencing sharp chest pains that happen spontaneously throughout the day. The pain is not associated with exertion or movement. Unclear etiology. She states that prior to 07-Apr-2023 she did not experience chest pains. She states she could climb stairs without having chest pains.  Will monitor and recommended follow-up with her primary care physician.  Plan Monitor Follow-up with PCP

## 2018-05-09 NOTE — Patient Instructions (Signed)
[name],  Please follow up with orthopedic surgery for your left ankle pain. Please follow up with your primary care provider Dr. Berline Lopes for your other chronic medical conditions.

## 2018-05-09 NOTE — Assessment & Plan Note (Addendum)
History of present illness:  Patient reports a history of hypertension. She states she takes lisinopril-hydrochlorothiazide however has not been taking it for the past several months due to cost.  Assessment: uncontrolled Essential hypertension Blood pressure today is 163/98 due to non-adherence to blood pressure medications.  Plan -Refill blood pressure medications to [facility] to help with cost. - follow up with PCP

## 2018-05-09 NOTE — Assessment & Plan Note (Signed)
Assessment: Depression PHD 9 today was 13. Denies any suicidal ideation. States that her brother passed away in Apr 11, 2023 and this has been causing her anxiety and depression. Discussed starting an SSRI. Patient states she would like to wait and re-evaluate at next visit.  Plan -Reevaluate phq-9 at next visit

## 2018-05-10 ENCOUNTER — Other Ambulatory Visit: Payer: Self-pay | Admitting: Internal Medicine

## 2018-05-10 DIAGNOSIS — E785 Hyperlipidemia, unspecified: Secondary | ICD-10-CM

## 2018-05-10 DIAGNOSIS — K219 Gastro-esophageal reflux disease without esophagitis: Secondary | ICD-10-CM

## 2018-05-11 NOTE — Progress Notes (Signed)
Internal Medicine Clinic Attending  Case discussed with Dr. Hoffman at the time of the visit.  We reviewed the resident's history and exam and pertinent patient test results.  I agree with the assessment, diagnosis, and plan of care documented in the resident's note.  

## 2018-05-16 ENCOUNTER — Ambulatory Visit (INDEPENDENT_AMBULATORY_CARE_PROVIDER_SITE_OTHER): Payer: Self-pay | Admitting: *Deleted

## 2018-05-16 DIAGNOSIS — Z23 Encounter for immunization: Secondary | ICD-10-CM

## 2018-05-22 ENCOUNTER — Encounter (HOSPITAL_COMMUNITY): Payer: Self-pay

## 2018-05-22 ENCOUNTER — Emergency Department (HOSPITAL_COMMUNITY)
Admission: EM | Admit: 2018-05-22 | Discharge: 2018-05-22 | Disposition: A | Discharge status: Home / Self Care | Payer: Self-pay | Attending: Emergency Medicine | Admitting: Emergency Medicine

## 2018-05-22 ENCOUNTER — Other Ambulatory Visit: Payer: Self-pay

## 2018-05-22 ENCOUNTER — Emergency Department (HOSPITAL_COMMUNITY): Payer: Self-pay

## 2018-05-22 DIAGNOSIS — M62830 Muscle spasm of back: Secondary | ICD-10-CM | POA: Insufficient documentation

## 2018-05-22 DIAGNOSIS — I10 Essential (primary) hypertension: Secondary | ICD-10-CM | POA: Insufficient documentation

## 2018-05-22 DIAGNOSIS — Z79899 Other long term (current) drug therapy: Secondary | ICD-10-CM | POA: Insufficient documentation

## 2018-05-22 MED ORDER — KETOROLAC TROMETHAMINE 30 MG/ML IJ SOLN
30.0000 mg | Freq: Once | INTRAMUSCULAR | Status: AC
  Administered 2018-05-22: 30 mg via INTRAMUSCULAR
  Filled 2018-05-22: qty 1

## 2018-05-22 MED ORDER — CYCLOBENZAPRINE HCL 10 MG PO TABS
10.0000 mg | ORAL_TABLET | Freq: Once | ORAL | Status: AC
  Administered 2018-05-22: 10 mg via ORAL
  Filled 2018-05-22: qty 1

## 2018-05-22 MED ORDER — NAPROXEN 375 MG PO TABS: 375.0000 mg | ORAL_TABLET | Freq: Two times a day (BID) | ORAL | 0 refills | Status: AC

## 2018-05-22 MED ORDER — CYCLOBENZAPRINE HCL 10 MG PO TABS: 10.0000 mg | ORAL_TABLET | Freq: Two times a day (BID) | ORAL | 0 refills | Status: DC | PRN

## 2018-05-22 MED ORDER — LISINOPRIL 20 MG PO TABS
20.0000 mg | ORAL_TABLET | Freq: Once | ORAL | Status: AC
  Administered 2018-05-22: 20 mg via ORAL
  Filled 2018-05-22: qty 1

## 2018-05-22 MED ORDER — FENTANYL CITRATE (PF) 100 MCG/2ML IJ SOLN
25.0000 ug | Freq: Once | INTRAMUSCULAR | Status: AC
  Administered 2018-05-22: 25 ug via INTRAMUSCULAR
  Filled 2018-05-22: qty 2

## 2018-05-22 MED ORDER — HYDROCHLOROTHIAZIDE 12.5 MG PO CAPS
25.0000 mg | ORAL_CAPSULE | Freq: Once | ORAL | Status: AC
  Administered 2018-05-22: 25 mg via ORAL
  Filled 2018-05-22: qty 2

## 2018-05-22 NOTE — ED Triage Notes (Addendum)
Patient c/o constant pain in right mid/lower back that started around 11am. Denies n/v or problems urinating. Patient states it hurts worse when she takes a deep breath in or movement.  10/10 pain  A/Ox4 Ambulatory in triage.  Hx. Hypertension -patient did not take BP medication today.

## 2018-05-22 NOTE — Discharge Instructions (Addendum)
You may alternate taking Tylenol and Naproxen as needed for pain control. You may take Naproxen twice daily as directed on your discharge paperwork and you may take  [phone] mg of Tylenol every 6 hours. Do not exceed 4000 mg of Tylenol daily as this can lead to liver damage. Also, make sure to take Naproxen with meals as it can cause an upset stomach. Do not take other NSAIDs while taking Naproxen such as (Aleve, Ibuprofen, Aspirin, Celebrex, etc) and do not take more than the prescribed dose as this can lead to ulcers and bleeding in your GI tract. You may use warm and cold compresses to help with your symptoms.   Prescription given for flexeril. Take medication as directed and do not operate machinery, drive a car, or work while taking this medication as it can make you drowsy.    Please follow up with your primary doctor within the next 7-10 days for re-evaluation and further treatment of your symptoms.   Please return to the ER sooner if you have any new or worsening symptoms.

## 2018-05-22 NOTE — ED Provider Notes (Signed)
[place] DEPT Provider Note   CSN: [id number] Arrival date & time: 05/22/18  1512     History   Chief Complaint Chief Complaint  Patient presents with  . Back Pain    HPI Sherry [name] is a 57 y.o. female.  HPI   Pt is a 57 y/o female with a h/o GERD, uterine CA s/p hysterectomy, nephrolithiasis, fibromyalgia, HTN, who presents to the ED today c/o right mid back pain that began around 11AM. States pain has worsened since onset. Pain is constant and severe in nature. Pain is described as an aching pain. Pain radiates to the right anterior ribs. She denies SOB. Pain is worse with inspiration and movement. Pain feels similar to her fibromyalgia.  Reports that last week she had a cough, congestion, rhinorrhea, body aches. Those sxs have since resolved.   States she has had kidney stones in the past and sxs today do not feel similar. States her kidney stones hurt much worse than this. Denies chest pain, abd pain. Denies NVD, constipation, urinary sxs. No fevers/ chills.  Past Medical History:  Diagnosis Date  . Acid reflux disease   . Anginal pain ([place])    last time 04/18/13 in afternoon; referred to [name]  . Anxiety   . Cancer [facility])    uterine- had hysterectomy  . Chronic pain   . Depression   . Fibromyalgia   . History of blood transfusion 1981   childbirth  . History of kidney stones    passed  . Hypertension   . Migraine    last one was 04/18/13  . Sleep apnea   . Trichomonas contact, treated     Patient Active Problem List   Diagnosis Date Noted  . Depression 05/09/2018  . Influenza A 07/08/2017  . Chronic pain 06/19/2014  . Unspecified sleep apnea 05/18/2013  . Calcaneus fracture, left 05/16/2013  . Lumbar facet arthropathy 03/10/2013  . Left wrist pain 03/10/2013  . Headache(784.0) 12/27/2012  . Foot pain 07/07/2012  . Cough 09/25/2011  . High cholesterol 09/10/2011  . Borderline diabetes 09/10/2011  .  Obesity 09/02/2011  . Chest pain 04/07/2011  . Fibromyalgia 03/24/2011  . Anxiety 03/24/2011  . Sleep disorder 03/24/2011  . Hypertension 03/24/2011  . GERD 01/05/2007    Past Surgical History:  Procedure Laterality Date  . CHOLECYSTECTOMY     2008  . ORIF CALCANEOUS FRACTURE Left 05/16/2013   Procedure: OPEN REDUCTION INTERNAL FIXATION (ORIF) LEFT CALCANEOUS FRACTURE;  Surgeon: Rozanna Box, MD;  Location: [place];  Service: Orthopedics;  Laterality: Left;  . TOTAL ABDOMINAL HYSTERECTOMY     1986  . WISDOM TOOTH EXTRACTION       OB History    Gravida  2   Para  2   Term  2   Preterm      AB      Living  2     SAB      TAB      Ectopic      Multiple      Live Births               Home Medications    Prior to Admission medications   Medication Sig Start Date End Date Taking? Authorizing Provider  zolpidem (AMBIEN) 10 MG tablet Take 10 mg by mouth at bedtime.  11/29/14  Yes [provider]  clonazePAM (KLONOPIN) 0.5 MG tablet Take 0.5 mg by mouth daily as needed for  anxiety.  11/29/14   [provider]  cyclobenzaprine (FLEXERIL) 10 MG tablet Take 1 tablet (10 mg total) by mouth 2 (two) times daily as needed for muscle spasms. 05/22/18   Olliver Boyadjian S, PA-C  diclofenac sodium (VOLTAREN) 1 % GEL Apply 2 g topically 4 (four) times daily. 05/09/18   Kalman Shan Ratliff, DO  DM-Phenylephrine-Acetaminophen (THERAFLU EXPRESSMAX) 10-5-325 MG/15ML LIQD Take 30 mLs by mouth every 8 (eight) hours as needed (flu symptoms).    [provider]  ibuprofen (ADVIL,MOTRIN) 200 MG tablet Take 400 mg by mouth every 6 (six) hours as needed for fever or moderate pain.     [provider]  lisinopril-hydrochlorothiazide (PRINZIDE,ZESTORETIC) 20-25 MG tablet Take 1 tablet by mouth daily. 05/09/18   Kalman Shan Ratliff, DO  methocarbamol (ROBAXIN) 500 MG tablet Take 1 tablet (500 mg total) by mouth 2 (two) times daily. 04/27/18    Henderly, Britni A, PA-C  naproxen (NAPROSYN) 375 MG tablet Take 1 tablet (375 mg total) by mouth 2 (two) times daily for 5 days. 05/22/18 05/27/18  Gurnoor Sloop S, PA-C  ondansetron (ZOFRAN) 4 MG tablet Take 1 tablet (4 mg total) by mouth every 6 (six) hours as needed for nausea. 07/09/17   Neva Seat, MD  pantoprazole (PROTONIX) 40 MG tablet Take 1 tablet (40 mg total) by mouth daily. 05/09/18   Kalman Shan Ratliff, DO  pseudoephedrine-acetaminophen (TYLENOL SINUS) 30-500 MG TABS tablet Take 1 tablet by mouth every 4 (four) hours as needed (flu symptoms).    [provider]  simvastatin (ZOCOR) 40 MG tablet Take 1 tablet (40 mg total) by mouth at bedtime. 05/09/18   Valinda Party, DO    Family History Family History  Problem Relation Age of Onset  . Diabetes Mother   . Hypertension Mother   . Depression Mother   . Diabetes Sister   . Hypertension Father   . Cancer Father        Prostate  . Alcohol abuse Father   . Cancer Brother        Prostate    Social History Social History   Tobacco Use  . Smoking status: Never Smoker  . Smokeless tobacco: Never Used  Substance Use Topics  . Alcohol use: No  . Drug use: No     Allergies   Patient has no known allergies.   Review of Systems Review of Systems  Constitutional: Negative for fever.  HENT: Negative for ear pain and sore throat.   Eyes: Negative for visual disturbance.  Respiratory: Negative for shortness of breath.   Cardiovascular: Negative for chest pain.  Gastrointestinal: Negative for abdominal pain, constipation, diarrhea, nausea and vomiting.  Genitourinary: Negative for dysuria and hematuria.  Musculoskeletal: Positive for back pain.  Skin: Negative for rash.  Neurological: Negative for seizures and syncope.  All other systems reviewed and are negative.   Physical Exam Updated Vital Signs BP (!) 147/80   Pulse 76   Temp 98.2 F (36.8 C) (Oral)   Resp 18   Ht 5' (1.524  m)   Wt 78.5 kg   SpO2 99%   BMI 33.79 kg/m   Physical Exam Vitals signs and nursing note reviewed.  Constitutional:      General: She is not in acute distress.    Appearance: She is well-developed. She is not ill-appearing or toxic-appearing.  HENT:     Head: Normocephalic and atraumatic.  Eyes:     Conjunctiva/sclera: Conjunctivae normal.  Neck:  Musculoskeletal: Neck supple.  Cardiovascular:     Rate and Rhythm: Normal rate and regular rhythm.     Heart sounds: Normal heart sounds. No murmur.  Pulmonary:     Effort: Pulmonary effort is normal. No respiratory distress.     Breath sounds: Normal breath sounds. No stridor. No wheezing or rhonchi.     Comments: TTP to the posterior and lateral ribs without deformity or step-off which reproduces her pain. No rash present.  Abdominal:     General: Bowel sounds are normal.     Palpations: Abdomen is soft.     Tenderness: There is no abdominal tenderness. There is no right CVA tenderness or left CVA tenderness.  Musculoskeletal: Normal range of motion.        General: No tenderness.     Right lower leg: No edema.     Left lower leg: No edema.  Skin:    General: Skin is warm and dry.  Neurological:     Mental Status: She is alert.      ED Treatments / Results  Labs (all labs ordered are listed, but only abnormal results are displayed) Labs Reviewed - No data to display  EKG None  Radiology Dg Ribs Unilateral W/chest Right  Result Date: 05/22/2018 CLINICAL DATA:  Onset right rib pain this morning.  No known injury. EXAM: RIGHT RIBS AND CHEST - 3+ VIEW COMPARISON:  PA and lateral chest 07/08/2017. FINDINGS: The lungs are clear. No pneumothorax or pleural effusion. Heart size is normal. No fracture or other bony abnormality. IMPRESSION: Negative exam. Electronically Signed   By: Inge Rise M.D.   On: 05/22/2018 18:38    Procedures Procedures (including critical care time)  Medications Ordered in  ED Medications  fentaNYL (SUBLIMAZE) injection 25 mcg (has no administration in time range)  ketorolac (TORADOL) 30 MG/ML injection 30 mg (30 mg Intramuscular Given 05/22/18 1759)  cyclobenzaprine (FLEXERIL) tablet 10 mg (10 mg Oral Given 05/22/18 1757)  lisinopril (PRINIVIL,ZESTRIL) tablet 20 mg (20 mg Oral Given 05/22/18 1757)  hydrochlorothiazide (MICROZIDE) capsule 25 mg (25 mg Oral Given 05/22/18 1757)     Initial Impression / Assessment and Plan / ED Course  I have reviewed the triage vital signs and the nursing notes.  Pertinent labs & imaging results that were available during my care of the patient were reviewed by me and considered in my medical decision making (see chart for details).     Final Clinical Impressions(s) / ED Diagnoses   Final diagnoses:  Muscle spasm of back   Patient complaining of right-sided back pain and right lateral back pain that began earlier today.  States pain is worse with movement and when laying on her side.  No rashes.  No fevers.  Had URI symptoms last week that have resolved.  Has history of fibromyalgia and states that her symptoms feel similar to this.  Has elevated blood pressure on arrival however states she has not taken her blood pressure medications today.  States she ran out.  Afebrile and otherwise vital signs are stable.  On exam her back pain is consistently reproducible to the right mid back into the right lateral ribs.  She has no overlying ecchymosis or rashes.  No step-off or obvious deformity.  Lungs are clear bilaterally.  She was given a dose of Toradol and Flexeril in the ED with some improvement of her symptoms.  She has no risk factors for PE.  Her symptoms seem most likely to be skeletal musculoskeletal  in nature given that pain is consistently reproducible on exam and is worse with movement.  She has no chest pain or shortness of breath.  Will give Rx for Flexeril and naproxen for home.  We will also give her her prescription for  blood pressure medications until she can follow-up with her PCP.  Advised her to return to the ER for any new or worsening symptoms in the meantime.  She voiced understanding of plan agrees to return.  All questions answered.  ED Discharge Orders         Ordered    cyclobenzaprine (FLEXERIL) 10 MG tablet  2 times daily PRN     05/22/18 1909    naproxen (NAPROSYN) 375 MG tablet  2 times daily     05/22/18 1909           Bishop Dublin 05/22/18 1910    Quintella Reichert, MD 05/25/18 [phone]

## 2018-05-22 NOTE — ED Notes (Signed)
Pt given education about pain control medication and being driven home by family.

## 2018-05-24 ENCOUNTER — Ambulatory Visit (INDEPENDENT_AMBULATORY_CARE_PROVIDER_SITE_OTHER): Payer: Self-pay | Admitting: Orthopaedic Surgery

## 2018-05-26 ENCOUNTER — Other Ambulatory Visit: Payer: Self-pay | Admitting: Nurse Practitioner

## 2018-05-26 ENCOUNTER — Other Ambulatory Visit: Payer: Self-pay | Admitting: Internal Medicine

## 2018-05-26 MED FILL — SIMVASTATIN 40 MG TABLET: 40 | 30 days supply | Qty: 30 | Fill #0

## 2018-05-26 MED FILL — DICLOFENAC SODIUM 1 % GEL: 1 | 13 days supply | Qty: 100 | Fill #0

## 2018-05-26 MED FILL — PANTOPRAZOLE SOD DR 40 MG T: 40 | 30 days supply | Qty: 30 | Fill #0

## 2018-05-26 MED FILL — LISINOPRIL-HCTZ 20-25 MG TA: 20-25 | 30 days supply | Qty: 30 | Fill #0

## 2018-06-20 ENCOUNTER — Encounter: Payer: Self-pay | Admitting: Internal Medicine

## 2018-06-20 ENCOUNTER — Ambulatory Visit (INDEPENDENT_AMBULATORY_CARE_PROVIDER_SITE_OTHER): Payer: Self-pay | Admitting: Internal Medicine

## 2018-06-20 ENCOUNTER — Other Ambulatory Visit: Payer: Self-pay

## 2018-06-20 VITALS — BP 129/77 | HR 89 | Temp 98.5°F | Wt 176.6 lb

## 2018-06-20 DIAGNOSIS — I10 Essential (primary) hypertension: Secondary | ICD-10-CM

## 2018-06-20 DIAGNOSIS — M79673 Pain in unspecified foot: Secondary | ICD-10-CM

## 2018-06-20 DIAGNOSIS — F329 Major depressive disorder, single episode, unspecified: Secondary | ICD-10-CM

## 2018-06-20 DIAGNOSIS — K219 Gastro-esophageal reflux disease without esophagitis: Secondary | ICD-10-CM

## 2018-06-20 DIAGNOSIS — R7303 Prediabetes: Secondary | ICD-10-CM

## 2018-06-20 DIAGNOSIS — E7800 Pure hypercholesterolemia, unspecified: Secondary | ICD-10-CM

## 2018-06-20 DIAGNOSIS — R110 Nausea: Secondary | ICD-10-CM

## 2018-06-20 DIAGNOSIS — D649 Anemia, unspecified: Secondary | ICD-10-CM | POA: Insufficient documentation

## 2018-06-20 DIAGNOSIS — M797 Fibromyalgia: Secondary | ICD-10-CM

## 2018-06-20 DIAGNOSIS — Z791 Long term (current) use of non-steroidal anti-inflammatories (NSAID): Secondary | ICD-10-CM

## 2018-06-20 DIAGNOSIS — F32A Depression, unspecified: Secondary | ICD-10-CM

## 2018-06-20 DIAGNOSIS — G8921 Chronic pain due to trauma: Secondary | ICD-10-CM

## 2018-06-20 DIAGNOSIS — Z79899 Other long term (current) drug therapy: Secondary | ICD-10-CM

## 2018-06-20 MED ORDER — CYCLOBENZAPRINE HCL 10 MG PO TABS: 10.0000 mg | ORAL_TABLET | Freq: Two times a day (BID) | ORAL | 1 refills | Status: DC | PRN

## 2018-06-20 MED ORDER — SIMVASTATIN 40 MG PO TABS: 40.0000 mg | ORAL_TABLET | Freq: Every day | ORAL | 3 refills | Status: DC

## 2018-06-20 MED ORDER — PROMETHAZINE HCL 12.5 MG PO TABS: 12.5000 mg | ORAL_TABLET | Freq: Every day | ORAL | 0 refills | Status: DC | PRN

## 2018-06-20 MED ORDER — LISINOPRIL-HYDROCHLOROTHIAZIDE 20-25 MG PO TABS: 1.0000 | ORAL_TABLET | Freq: Every day | ORAL | 3 refills | Status: DC

## 2018-06-20 MED ORDER — IBUPROFEN 200 MG PO TABS: 400.0000 mg | ORAL_TABLET | Freq: Four times a day (QID) | ORAL | 1 refills | Status: DC | PRN

## 2018-06-20 MED ORDER — PANTOPRAZOLE SODIUM 40 MG PO TBEC: 40.0000 mg | DELAYED_RELEASE_TABLET | Freq: Every day | ORAL | 3 refills | Status: DC

## 2018-06-20 MED FILL — PROMETHAZINE 12.5 MG TABLET: 12.5 | 30 days supply | Qty: 30 | Fill #0

## 2018-06-20 MED FILL — CYCLOBENZAPRINE 10 MG TAB: 10 | 15 days supply | Qty: 30 | Fill #0

## 2018-06-20 NOTE — Assessment & Plan Note (Signed)
  PHQ-9: Based on the patients score of 7 we have discussed speaking with a BHT vs medication. She has decided to resume this conversation at our next visit.

## 2018-06-20 NOTE — Assessment & Plan Note (Signed)
Obtain CBC at next visit.

## 2018-06-20 NOTE — Progress Notes (Signed)
   CC: Routine visit for HTN, fibromyalgia  HPI:[name] is a 58 y.o. female who presents for evaluation of Routine visit for HTN, fibromyalgia. Please see individual problem based A/P for details.  Past Medical History:  Diagnosis Date  . Acid reflux disease   . Anginal pain ([place])    last time 04/18/13 in afternoon; referred to [name]  . Anxiety   . Cancer [facility])    uterine- had hysterectomy  . Chronic pain   . Depression   . Fibromyalgia   . History of blood transfusion 1981   childbirth  . History of kidney stones    passed  . Hypertension   . Migraine    last one was 04/18/13  . Sleep apnea   . Trichomonas contact, treated    Review of Systems:  ROS negative except as per HPI.  She endorses living at home with her daughter and denied concerns regarding this situation.  She denied recent EtOH use.   Physical Exam: Vitals:   06/20/18 1501  BP: 129/77  Pulse: 89  Temp: 98.5 F (36.9 C)  TempSrc: Oral  SpO2: 98%  Weight: 176 lb 9.6 oz (80.1 kg)   Physical Exam Constitutional:      General: She is not in acute distress.    Appearance: She is well-developed. She is not diaphoretic.  HENT:     Head: Normocephalic and atraumatic.  Eyes:     Conjunctiva/sclera: Conjunctivae normal.  Neck:     Musculoskeletal: Normal range of motion and neck supple.  Cardiovascular:     Rate and Rhythm: Normal rate and regular rhythm.     Heart sounds: No murmur.  Pulmonary:     Effort: Pulmonary effort is normal. No respiratory distress.     Breath sounds: Normal breath sounds. No stridor.  Abdominal:     General: Bowel sounds are normal. There is no distension.     Palpations: Abdomen is soft.     Tenderness: There is no abdominal tenderness.  Musculoskeletal:        General: Signs of injury (Prior traumatic foot injury, pain persists ) present. No tenderness.     Left lower leg: Edema (Mild) present.  Skin:    General: Skin is warm.     Capillary  Refill: Capillary refill takes less than 2 seconds.  Neurological:     Mental Status: She is alert and oriented to person, place, and time.    Assessment & Plan:   See Encounters Tab for problem based charting.  Patient discussed with Dr. Lynnae January

## 2018-06-20 NOTE — Progress Notes (Signed)
Internal Medicine Clinic Attending  Case discussed with Dr. Harbrecht at the time of the visit.  We reviewed the resident's history and exam and pertinent patient test results.  I agree with the assessment, diagnosis, and plan of care documented in the resident's note.   

## 2018-06-20 NOTE — Assessment & Plan Note (Signed)
Fibromyalgia: Long standing. Had previously been treated with clonazepam, cyclobenzaprine.  Patient has been using ibuprofen and Aleve regularly most recently.  Patient experiences severe side effects from Cymbalta, including vomiting, nausea, hematemesis. Has attempted treatment with gabapentin which she feels induces weight gain.  She questions the utility of CBD oil which I advised I can not recommend as I know nothing of what is in the oil.   Plan:  Cyclobenzaprine 10mg  daily QHS for severe episodes Database, no recent narcotic refill since September 2018. Recommended fluoxetine vs citalopram. We will discuss this at her next visit.

## 2018-06-20 NOTE — Patient Instructions (Signed)
FOLLOW-UP INSTRUCTIONS When: 2-4 months For: routine visit What to bring: All of your medications  Please see your medication list for an updated review of the medications you are to be taking. If you have any questions please let us know.  Today we discussed your high blood pressure, fibromyalgia, acid reflux, left foot pain and concern for feeling down about your foot.  Please continue your current blood pressure medication.  For your fibromyalgia, please continue the Aleve 220-440mg  daily. In addition please use the flexeril intermittently no more than once daily. Also, please consider the fluoxetine (Prosaz) as I feel this would greatly benefit you.  Please follow-up with the orthopedic doctor regarding the foot injury and the potential need for a brace.   Thank you for your visit to the [facility] today. If you have any questions or concerns please call us at [phone].

## 2018-06-20 NOTE — Assessment & Plan Note (Signed)
Will need A1c at next visit

## 2018-06-20 NOTE — Assessment & Plan Note (Signed)
  HTN: BP 129/77 today. Patient stated adherence to her current regimen today. She denied excess urination, dizziness, headache, visual changes, chest pain or pitting edema  Plan: Continue lisinopril-HCTZ 20-25mg  daily Obtain BMP at next visit

## 2018-06-20 NOTE — Assessment & Plan Note (Signed)
GERD: Nausea is a common symptoms of her GERD. She endorses that she uses this maybe 1-2 times per week and this is often associated with nausea.   Plan:  Promethazine 12.5 mg PRN weekly  Evaluate further at her next visit.

## 2018-06-21 ENCOUNTER — Encounter: Payer: Self-pay | Admitting: Internal Medicine

## 2018-06-27 ENCOUNTER — Ambulatory Visit (INDEPENDENT_AMBULATORY_CARE_PROVIDER_SITE_OTHER): Payer: Self-pay | Admitting: Orthopaedic Surgery

## 2018-06-28 NOTE — Addendum Note (Signed)
Addended by: Hulan Fray on: 06/28/2018 10:09 AM   Modules accepted: Orders

## 2018-07-01 MED FILL — LISINOPRIL-HCTZ 20-25 MG TA: 20-25 | 30 days supply | Qty: 30 | Fill #0

## 2018-07-01 MED FILL — PANTOPRAZOLE SOD DR 40 MG T: 40 | 30 days supply | Qty: 30 | Fill #0

## 2018-07-01 MED FILL — SIMVASTATIN 40 MG TABLET: 40 | 30 days supply | Qty: 30 | Fill #0

## 2018-07-18 ENCOUNTER — Emergency Department (HOSPITAL_COMMUNITY)
Admission: EM | Admit: 2018-07-18 | Discharge: 2018-07-18 | Disposition: A | Discharge status: Home / Self Care | Payer: Self-pay | Attending: Emergency Medicine | Admitting: Emergency Medicine

## 2018-07-18 ENCOUNTER — Other Ambulatory Visit: Payer: Self-pay

## 2018-07-18 ENCOUNTER — Emergency Department (HOSPITAL_COMMUNITY): Payer: Self-pay

## 2018-07-18 ENCOUNTER — Encounter (HOSPITAL_COMMUNITY): Payer: Self-pay

## 2018-07-18 DIAGNOSIS — Z79899 Other long term (current) drug therapy: Secondary | ICD-10-CM | POA: Insufficient documentation

## 2018-07-18 DIAGNOSIS — R42 Dizziness and giddiness: Secondary | ICD-10-CM | POA: Insufficient documentation

## 2018-07-18 DIAGNOSIS — B9789 Other viral agents as the cause of diseases classified elsewhere: Secondary | ICD-10-CM

## 2018-07-18 DIAGNOSIS — Z8541 Personal history of malignant neoplasm of cervix uteri: Secondary | ICD-10-CM | POA: Insufficient documentation

## 2018-07-18 DIAGNOSIS — I10 Essential (primary) hypertension: Secondary | ICD-10-CM | POA: Insufficient documentation

## 2018-07-18 DIAGNOSIS — J069 Acute upper respiratory infection, unspecified: Secondary | ICD-10-CM | POA: Insufficient documentation

## 2018-07-18 LAB — CBC WITH DIFFERENTIAL/PLATELET
Abs Immature Granulocytes: 0.02 10*3/uL (ref 0.00–0.07)
BASOS PCT: 0 %
Basophils Absolute: 0 10*3/uL (ref 0.0–0.1)
Eosinophils Absolute: 0.1 10*3/uL (ref 0.0–0.5)
Eosinophils Relative: 1 %
HCT: 41.8 % (ref 36.0–46.0)
Hemoglobin: 13.7 g/dL (ref 12.0–15.0)
Immature Granulocytes: 0 %
Lymphocytes Relative: 19 %
Lymphs Abs: 2.1 10*3/uL (ref 0.7–4.0)
MCH: 29.9 pg (ref 26.0–34.0)
MCHC: 32.8 g/dL (ref 30.0–36.0)
MCV: 91.3 fL (ref 80.0–100.0)
MONOS PCT: 8 %
Monocytes Absolute: 0.9 10*3/uL (ref 0.1–1.0)
Neutro Abs: 8.1 10*3/uL — ABNORMAL HIGH (ref 1.7–7.7)
Neutrophils Relative %: 72 %
Platelets: 473 10*3/uL — ABNORMAL HIGH (ref 150–400)
RBC: 4.58 MIL/uL (ref 3.87–5.11)
RDW: 13.5 % (ref 11.5–15.5)
WBC: 11.3 10*3/uL — ABNORMAL HIGH (ref 4.0–10.5)
nRBC: 0 % (ref 0.0–0.2)

## 2018-07-18 LAB — URINALYSIS, ROUTINE W REFLEX MICROSCOPIC
Bilirubin Urine: NEGATIVE
Glucose, UA: NEGATIVE mg/dL
KETONES UR: NEGATIVE mg/dL
Leukocytes, UA: NEGATIVE
Nitrite: NEGATIVE
Protein, ur: 30 mg/dL — AB
Specific Gravity, Urine: 1.01 (ref 1.005–1.030)
pH: 6 (ref 5.0–8.0)

## 2018-07-18 LAB — BASIC METABOLIC PANEL
Anion gap: 11 (ref 5–15)
BUN: 13 mg/dL (ref 6–20)
CO2: 27 mmol/L (ref 22–32)
Calcium: 9.8 mg/dL (ref 8.9–10.3)
Chloride: 98 mmol/L (ref 98–111)
Creatinine, Ser: 0.99 mg/dL (ref 0.44–1.00)
GFR calc Af Amer: >60 mL/min (ref >60)
GFR calc non Af Amer: >60 mL/min (ref >60)
Glucose, Bld: 159 mg/dL — ABNORMAL HIGH (ref 70–99)
Potassium: 3.7 mmol/L (ref 3.5–5.1)
Sodium: 136 mmol/L (ref 135–145)

## 2018-07-18 LAB — MAGNESIUM: Magnesium: 2.3 mg/dL (ref 1.7–2.4)

## 2018-07-18 LAB — INFLUENZA PANEL BY PCR (TYPE A & B)
INFLAPCR: NEGATIVE
Influenza B By PCR: NEGATIVE

## 2018-07-18 MED ORDER — SODIUM CHLORIDE 0.9 % IV SOLN
INTRAVENOUS | Status: DC
  Administered 2018-07-18: 09:00:00 via INTRAVENOUS

## 2018-07-18 MED ORDER — ONDANSETRON 4 MG PO TBDP: 4.0000 mg | ORAL_TABLET | Freq: Three times a day (TID) | ORAL | 0 refills | Status: DC | PRN

## 2018-07-18 MED ORDER — IBUPROFEN 400 MG PO TABS: 400.0000 mg | ORAL_TABLET | Freq: Four times a day (QID) | ORAL | 0 refills | Status: DC | PRN

## 2018-07-18 MED ORDER — SODIUM CHLORIDE 0.9 % IV BOLUS
1000.0000 mL | Freq: Once | INTRAVENOUS | Status: AC
  Administered 2018-07-18: 1000 mL via INTRAVENOUS

## 2018-07-18 MED ORDER — ONDANSETRON HCL 4 MG/2ML IJ SOLN
4.0000 mg | Freq: Once | INTRAMUSCULAR | Status: AC
  Administered 2018-07-18: 4 mg via INTRAVENOUS
  Filled 2018-07-18: qty 2

## 2018-07-18 MED ORDER — ACETAMINOPHEN ER 650 MG PO TBCR: 650.0000 mg | EXTENDED_RELEASE_TABLET | Freq: Three times a day (TID) | ORAL | 0 refills | Status: DC | PRN

## 2018-07-18 NOTE — ED Triage Notes (Signed)
Pt reports cough, fever, chills, and body aches since Thursday. Pt fell in the bathroom earlier this morning. Denies injury.

## 2018-07-18 NOTE — ED Notes (Signed)
Pt does not have pacemaker 

## 2018-07-18 NOTE — ED Notes (Signed)
Patient called out for nausea. MD made aware.

## 2018-07-18 NOTE — ED Notes (Signed)
Pt & family member have been made aware that the last thing we are waiting on is a UA sample.

## 2018-07-18 NOTE — ED Notes (Signed)
Pulse Ox while ambulating. HR 104, O2 98%

## 2018-07-18 NOTE — Discharge Instructions (Signed)
We think what you have is a viral syndrome - the treatment for which is symptomatic relief only, and your body will fight the infection off in a few days. We are prescribing you some meds for pain and fevers. See your primary care doctor in 1 week if the symptoms dont improve.  Please return to the ER if your symptoms worsen; you have increased pain, fevers, chills, inability to keep any medications down, confusion. Otherwise see the outpatient doctor as requested.

## 2018-07-18 NOTE — ED Notes (Signed)
Patient given crackers and sprite.

## 2018-07-18 NOTE — ED Provider Notes (Signed)
[facility] DEPT Provider Note   CSN: [id number] Arrival date & time: 07/18/18  3151     History   Chief Complaint Chief Complaint  Patient presents with  . Chills  . Cough  . Fall    HPI Sherry [name] is a 58 y.o. female.  HPI 58 year old female comes in with chief complaint of cough, fevers, chills, body aches and near syncope. Patient reports that she has been sick since last 3 or 4 days.  Her symptoms have been that of upper respiratory infection-like symptoms along with subjective fevers, chills, body aches.  She is also been feeling weak and earlier this morning when she went to the bathroom she started having dizziness, and she felt she might faint therefore she sat down.  Patient also had associated diaphoresis and nausea.  Patient works for a [employer] and has had contacts with patients who have had flu.  She did get an influenza exudation earlier this year.   Patient denies any chest pain, palpitation or shortness of breath. Pt has no hx of PE, DVT and denies any exogenous hormone (testosterone / estrogen) use, long distance travels or surgery in the past 6 weeks, active cancer, recent immobilization.  She denies any family history of brain bleeds or arrhythmia in young people.  Past Medical History:  Diagnosis Date  . Acid reflux disease   . Anginal pain ([place])    last time 04/18/13 in afternoon; referred to [name]  . Anxiety   . Cancer [facility])    uterine- had hysterectomy  . Chest pain 04/07/2011  . Chronic pain   . Cough 09/25/2011  . Depression   . Fibromyalgia   . Headache(784.0) 12/27/2012  . History of blood transfusion 1981   childbirth  . History of kidney stones    passed  . Hypertension   . Left wrist pain 03/10/2013  . Migraine    last one was 04/18/13  . Sleep apnea   . Trichomonas contact, treated     Patient Active Problem List   Diagnosis Date Noted  . Anemia 06/20/2018  . Depression  05/09/2018  . Influenza A 07/08/2017  . Chronic pain 06/19/2014  . Unspecified sleep apnea 05/18/2013  . Calcaneus fracture, left 05/16/2013  . Lumbar facet arthropathy 03/10/2013  . Foot pain 07/07/2012  . High cholesterol 09/10/2011  . Borderline diabetes 09/10/2011  . Obesity 09/02/2011  . Fibromyalgia 03/24/2011  . Anxiety 03/24/2011  . Sleep disorder 03/24/2011  . Hypertension 03/24/2011  . GERD 01/05/2007    Past Surgical History:  Procedure Laterality Date  . CHOLECYSTECTOMY     2008  . ORIF CALCANEOUS FRACTURE Left 05/16/2013   Procedure: OPEN REDUCTION INTERNAL FIXATION (ORIF) LEFT CALCANEOUS FRACTURE;  Surgeon: Rozanna Box, MD;  Location: [place];  Service: Orthopedics;  Laterality: Left;  . TOTAL ABDOMINAL HYSTERECTOMY     1986  . WISDOM TOOTH EXTRACTION       OB History    Gravida  2   Para  2   Term  2   Preterm      AB      Living  2     SAB      TAB      Ectopic      Multiple      Live Births               Home Medications    Prior to Admission medications   Medication  Sig Start Date End Date Taking? Authorizing Provider  aspirin-acetaminophen-caffeine (EXCEDRIN MIGRAINE) [phone] MG tablet Take 2 tablets by mouth every 6 (six) hours as needed for headache.   Yes [provider]  Aspirin-Salicylamide-Caffeine (BC HEADACHE POWDER PO) Take 1 Package by mouth as needed (headache).   Yes [provider]  cyclobenzaprine (FLEXERIL) 10 MG tablet Take 1 tablet (10 mg total) by mouth 2 (two) times daily as needed for muscle spasms. 06/20/18  Yes Kathi Ludwig, MD  guaiFENesin (MUCINEX) 600 MG 12 hr tablet Take 600 mg by mouth 2 (two) times daily as needed for cough or to loosen phlegm.   Yes [provider]  lisinopril-hydrochlorothiazide (PRINZIDE,ZESTORETIC) 20-25 MG tablet Take 1 tablet by mouth daily. 06/20/18  Yes Kathi Ludwig, MD  mupirocin ointment (BACTROBAN) 2 % Place 1 application into the  nose 2 (two) times daily as needed (leg pain).   Yes [provider]  pantoprazole (PROTONIX) 40 MG tablet Take 1 tablet (40 mg total) by mouth daily. 06/20/18  Yes Kathi Ludwig, MD  Phenyleph-Doxylamine-DM-APAP (NYQUIL SEVERE COLD/FLU) 5-6.25-10-325 MG/15ML LIQD Take 30 mLs by mouth at bedtime as needed (sleep/congestion).   Yes [provider]  promethazine (PHENERGAN) 12.5 MG tablet Take 1 tablet (12.5 mg total) by mouth daily as needed for nausea or vomiting. 06/20/18  Yes Kathi Ludwig, MD  simvastatin (ZOCOR) 40 MG tablet Take 1 tablet (40 mg total) by mouth at bedtime. 06/20/18  Yes Kathi Ludwig, MD  acetaminophen (TYLENOL 8 HOUR) 650 MG CR tablet Take 1 tablet (650 mg total) by mouth every 8 (eight) hours as needed. 07/18/18   Varney Biles, MD  ibuprofen (ADVIL,MOTRIN) 400 MG tablet Take 1 tablet (400 mg total) by mouth every 6 (six) hours as needed. 07/18/18   Varney Biles, MD  ondansetron (ZOFRAN ODT) 4 MG disintegrating tablet Take 1 tablet (4 mg total) by mouth every 8 (eight) hours as needed for nausea or vomiting. 07/18/18   Varney Biles, MD    Family History Family History  Problem Relation Age of Onset  . Diabetes Mother   . Hypertension Mother   . Depression Mother   . Diabetes Sister   . Hypertension Father   . Cancer Father        Prostate  . Alcohol abuse Father   . Cancer Brother        Prostate    Social History Social History   Tobacco Use  . Smoking status: Never Smoker  . Smokeless tobacco: Never Used  Substance Use Topics  . Alcohol use: No  . Drug use: No     Allergies   Cymbalta [duloxetine hcl] and Gabapentin   Review of Systems Review of Systems  Constitutional: Positive for activity change.  HENT: Positive for congestion.   Respiratory: Positive for cough. Negative for shortness of breath.   Cardiovascular: Negative for chest pain.  Neurological: Positive for dizziness and syncope.  All other  systems reviewed and are negative.    Physical Exam Updated Vital Signs BP 108/71 (BP Location: Right Arm)   Pulse 100   Temp 98.7 F (37.1 C) (Oral)   Resp (!) 22   Ht 5' (1.524 m)   Wt 79.8 kg   SpO2 100%   BMI 34.37 kg/m   Physical Exam Vitals signs and nursing note reviewed.  Constitutional:      Appearance: She is well-developed.  HENT:     Head: Normocephalic and atraumatic.  Eyes:     Extraocular Movements:  Extraocular movements intact.     Pupils: Pupils are equal, round, and reactive to light.  Neck:     Musculoskeletal: Normal range of motion and neck supple.  Cardiovascular:     Rate and Rhythm: Normal rate.  Pulmonary:     Effort: Pulmonary effort is normal.  Abdominal:     General: Bowel sounds are normal.  Musculoskeletal:     Right lower leg: No edema.     Left lower leg: No edema.  Skin:    General: Skin is warm and dry.  Neurological:     General: No focal deficit present.     Mental Status: She is alert and oriented to person, place, and time.     Cranial Nerves: No cranial nerve deficit.     Sensory: No sensory deficit.     Coordination: Coordination normal.      ED Treatments / Results  Labs (all labs ordered are listed, but only abnormal results are displayed) Labs Reviewed  BASIC METABOLIC PANEL - Abnormal; Notable for the following components:      Result Value   Glucose, Bld 159 (*)    All other components within normal limits  CBC WITH DIFFERENTIAL/PLATELET - Abnormal; Notable for the following components:   WBC 11.3 (*)    Platelets 473 (*)    Neutro Abs 8.1 (*)    All other components within normal limits  URINALYSIS, ROUTINE W REFLEX MICROSCOPIC - Abnormal; Notable for the following components:   Hgb urine dipstick SMALL (*)    Protein, ur 30 (*)    Bacteria, UA RARE (*)    All other components within normal limits  MAGNESIUM  INFLUENZA PANEL BY PCR (TYPE A & B)    EKG EKG Interpretation  Date/Time:  Monday July 18 2018 08:33:47 EST Ventricular Rate:  103 PR Interval:    QRS Duration: 113 QT Interval:  351 QTC Calculation: 460 R Axis:   30 Text Interpretation:  Sinus tachycardia Incomplete right bundle branch block No acute changes Nonspecific ST and T wave abnormality diffuse TWI in multiple leads that are not new Confirmed by Varney Biles ([id number]) on 07/18/2018 9:02:24 AM Also confirmed by Varney Biles [phone]), editor Lynder Parents [phone])  on 07/18/2018 12:29:31 PM   Radiology Dg Chest 2 View  Result Date: 07/18/2018 CLINICAL DATA:  Cough and congestion with shortness of breath EXAM: CHEST - 2 VIEW COMPARISON:  May 22, 2018 FINDINGS: There is a 1.1 x 0.6 cm of opacity in the left mid lung. The lungs are otherwise clear. The heart size and pulmonary vascularity are normal. No adenopathy. No lesions. IMPRESSION: 1.1 x 0.6 cm nodular opacity in the left mid lung, not present previously. This finding may warrant noncontrast enhanced chest CT to further evaluate. Lungs elsewhere clear. No adenopathy. Heart size normal. Electronically Signed   By: Lowella Grip III M.D.   On: 07/18/2018 11:33    Procedures Procedures (including critical care time)  Medications Ordered in ED Medications  sodium chloride 0.9 % bolus 1,000 mL (0 mLs Intravenous Stopped 07/18/18 0956)    And  0.9 %  sodium chloride infusion ( Intravenous Stopped 07/18/18 1422)  ondansetron (ZOFRAN) injection 4 mg (4 mg Intravenous Given 07/18/18 1009)     Initial Impression / Assessment and Plan / ED Course  I have reviewed the triage vital signs and the nursing notes.  Pertinent labs & imaging results that were available during my care of the patient were reviewed by me  and considered in my medical decision making (see chart for details).     58 year old female comes in with chief complaint of cough, chills, URI-like symptoms and weakness.  Earlier this morning she had a near syncope type event with a prodrome of  nausea and diaphoresis.  It appears that the near syncope was neurocardiogenic or vasovagal in nature.  Patient did not have any associated chest pain, shortness of breath, palpitation, focal neurologic complaints which is reassuring.  It appears to me that patient likely has flulike illness that is causing her to have reduced p.o. intake and dehydration.  Her EKG does have diffuse T wave inversions and nonspecific ST changes, however they are not new and in my opinion not related to her current symptoms.  we will monitor her on telemetry for short while, check some labs and also hydrate her.  Orthostatics have also been ordered and look overall reassuring.  If the labs are looking normal and patient passes oral challenge and we should be able to discharge her.  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  Flu swab is negative.  We will discharge her as she has passed oral challenge.  Strict ER return precautions discussed.  Final Clinical Impressions(s) / ED Diagnoses   Final diagnoses:  Viral URI with cough  Orthostatic dizziness    ED Discharge Orders         Ordered    ibuprofen (ADVIL,MOTRIN) 400 MG tablet  Every 6 hours PRN     07/18/18 1355    acetaminophen (TYLENOL 8 HOUR) 650 MG CR tablet  Every 8 hours PRN     07/18/18 1355    ondansetron (ZOFRAN ODT) 4 MG disintegrating tablet  Every 8 hours PRN     07/18/18 1355           Varney Biles, MD 07/18/18 1527

## 2018-07-19 ENCOUNTER — Telehealth: Payer: Self-pay | Admitting: Internal Medicine

## 2018-07-19 NOTE — Telephone Encounter (Signed)
Pt went to the ER for a cough and she is wanting the physician to prescribe her some cough medicine, pls send to [place], [place] also was told to follow-up with her PCP, on the xray at [facility] there seen a spot on her lungs; pt has appt 02/26 345pm    Pt contact [phone]- Dr Berline Lopes

## 2018-07-19 NOTE — Telephone Encounter (Signed)
Would like cough med called in. Do you want her to be seen soon in [facility] or your next open 3/11 for the chest xray results?

## 2018-07-21 NOTE — Telephone Encounter (Signed)
Pt calling back to speak with a nurse about getting cough medicine. Please call back.

## 2018-08-02 NOTE — Progress Notes (Signed)
   CC: HTN, fibromyalgia  HPI:Ms.VEIDA SPIRA is a 58 y.o. female who presents for evaluation of HTN and fibromyalgia treatment. Please see individual problem based A/P for details.  Past Medical History:  Diagnosis Date  . Acid reflux disease   . Anginal pain ([place])    last time 04/18/13 in afternoon; referred to [name]  . Anxiety   . Cancer [facility])    uterine- had hysterectomy  . Chest pain 04/07/2011  . Chronic pain   . Cough 09/25/2011  . Depression   . Fibromyalgia   . Headache(784.0) 12/27/2012  . History of blood transfusion 1981   childbirth  . History of kidney stones    passed  . Hypertension   . Influenza A 07/08/2017  . Left wrist pain 03/10/2013  . Migraine    last one was 04/18/13  . Sleep apnea   . Trichomonas contact, treated    Review of Systems:  ROS negative except as per HPI.  Physical Exam: Vitals:   08/03/18 1543  BP: (!) 162/92  Pulse: (!) 108  Temp: 98.7 F (37.1 C)  TempSrc: Oral  SpO2: 98%  Weight: 176 lb 11.2 oz (80.2 kg)   General: A/O x4, in no acute distress, afebrile, nondiaphoretic Cardio: RRR, no mrg's Pulmonary: CTA bilaterally MSK: BLE nontender, nonedematous Psych: Appropriate affect, not depressed in appearance, engages well  Assessment & Plan:   See Encounters Tab for problem based charting.  Patient discussed with Dr. Eppie Gibson

## 2018-08-03 ENCOUNTER — Ambulatory Visit (INDEPENDENT_AMBULATORY_CARE_PROVIDER_SITE_OTHER): Payer: Self-pay | Admitting: Internal Medicine

## 2018-08-03 ENCOUNTER — Encounter: Payer: Self-pay | Admitting: Internal Medicine

## 2018-08-03 VITALS — BP 137/87 | HR 96 | Temp 98.7°F | Wt 176.7 lb

## 2018-08-03 DIAGNOSIS — Z9114 Patient's other noncompliance with medication regimen: Secondary | ICD-10-CM

## 2018-08-03 DIAGNOSIS — M797 Fibromyalgia: Secondary | ICD-10-CM

## 2018-08-03 DIAGNOSIS — B9789 Other viral agents as the cause of diseases classified elsewhere: Secondary | ICD-10-CM | POA: Insufficient documentation

## 2018-08-03 DIAGNOSIS — Z79899 Other long term (current) drug therapy: Secondary | ICD-10-CM

## 2018-08-03 DIAGNOSIS — Z90710 Acquired absence of both cervix and uterus: Secondary | ICD-10-CM

## 2018-08-03 DIAGNOSIS — R911 Solitary pulmonary nodule: Secondary | ICD-10-CM | POA: Insufficient documentation

## 2018-08-03 DIAGNOSIS — Z1239 Encounter for other screening for malignant neoplasm of breast: Secondary | ICD-10-CM | POA: Insufficient documentation

## 2018-08-03 DIAGNOSIS — J22 Unspecified acute lower respiratory infection: Secondary | ICD-10-CM

## 2018-08-03 DIAGNOSIS — Z7722 Contact with and (suspected) exposure to environmental tobacco smoke (acute) (chronic): Secondary | ICD-10-CM

## 2018-08-03 DIAGNOSIS — Z1211 Encounter for screening for malignant neoplasm of colon: Secondary | ICD-10-CM | POA: Insufficient documentation

## 2018-08-03 DIAGNOSIS — I10 Essential (primary) hypertension: Secondary | ICD-10-CM

## 2018-08-03 MED ORDER — GUAIFENESIN-CODEINE 100-10 MG/5ML PO SYRP: 5.0000 mL | ORAL_SOLUTION | Freq: Three times a day (TID) | ORAL | 0 refills | Status: DC | PRN

## 2018-08-03 MED FILL — GUAIATUSSIN AC LIQUID: 100-10 | 8 days supply | Qty: 120 | Fill #0

## 2018-08-03 NOTE — Assessment & Plan Note (Signed)
Need for colon cancer screening: FIT testing.

## 2018-08-03 NOTE — Patient Instructions (Signed)
FOLLOW-UP INSTRUCTIONS When: 2-3 months For: Routine visit What to bring: All of your medications  I have not made any significant changes to your medications today. I have added a cough suppressant for your severe cough.  Today we discussed your incidental lung finding, viral lung illness and your high blood pressure. If you have any questions please notify us. I have ordered the Chest CT scan and someone will call you to schedule this.   Thank you for your visit to the [facility] today. If you have any questions or concerns please call us at [phone].

## 2018-08-03 NOTE — Assessment & Plan Note (Signed)
Viral Lower Respiratory tract infection: Persistent cough but overall improving since onset February 10th of fever, chills, cough, myalgias. Tested negative for flu. 16 day course thus far with marked improvement and resolution of all symptoms aside from her cough. I have advised her to return if she continues to improve and then . Will prescribe a cough suppressant today and treated symptomatically.

## 2018-08-03 NOTE — Progress Notes (Signed)
Case discussed with Dr. Harbrecht at the time of the visit. We reviewed the resident's history and exam and pertinent patient test results. I agree with the assessment, diagnosis, and plan of care documented in the resident's note 

## 2018-08-03 NOTE — Assessment & Plan Note (Signed)
Incidental pulmonary opacity of the left mid lung: Small ~1cm?, previously not identified a year prior on CXR or the month before on a plain film of her ribs. Moderate lung cancer risk, not a smoker but exposed to 2nd hand smoke extensively. Likely a reactive process 2/2 to her acute viral infection. However, given her fathers history of lung cancer, we will order a CT chest w/o contrast to better delineate.

## 2018-08-03 NOTE — Assessment & Plan Note (Signed)
  Hypertension: Patient's BP today is 162/92 with a goal of <140/80. The patient denied adherence to her medication regimen. She uses her BP therapy intermittently at best 15/30 doses in the past month. She denied, chest pain, headache, visual changes, lightheadedness, weakness, dizziness on standing, swelling in the feet or ankles.   Plan: Continue lisinopril/HCTZ 20/25mg  daily combo Return in 2-3 months for BP check

## 2018-08-03 NOTE — Assessment & Plan Note (Addendum)
Need for breast cancer screening:  Mammogram ordered. Patient given information for self pay  Need for cervical cancer screening: Total hysterectomy completed. Patient will consider final Pap as she is uncertain if her cervix remains.

## 2018-08-11 MED FILL — PANTOPRAZOLE SOD DR 40 MG T: 40 | 30 days supply | Qty: 30 | Fill #1 | Status: TO

## 2018-08-11 MED FILL — LISINOPRIL-HCTZ 20-25 MG TA: 20-25 | 30 days supply | Qty: 30 | Fill #1 | Status: TO

## 2018-08-11 MED FILL — SIMVASTATIN 40 MG TABLET: 40 | 30 days supply | Qty: 30 | Fill #1

## 2018-08-11 MED FILL — DICLOFENAC SODIUM 1 % GEL: 1 | 13 days supply | Qty: 100 | Fill #1

## 2018-08-11 MED FILL — CYCLOBENZAPRINE 10 MG TAB: 10 | 15 days supply | Qty: 30 | Fill #1

## 2018-08-26 ENCOUNTER — Other Ambulatory Visit: Payer: Self-pay | Admitting: Internal Medicine

## 2018-08-26 DIAGNOSIS — J22 Unspecified acute lower respiratory infection: Principal | ICD-10-CM

## 2018-08-26 DIAGNOSIS — R110 Nausea: Secondary | ICD-10-CM

## 2018-08-26 DIAGNOSIS — B9789 Other viral agents as the cause of diseases classified elsewhere: Secondary | ICD-10-CM

## 2018-08-26 NOTE — Telephone Encounter (Signed)
Next appt scheduled 3/25 with PCP. 

## 2018-08-27 NOTE — Telephone Encounter (Signed)
Refills not appropriate. Patient will need to speak with me as this cough appears to be chronic now.

## 2018-08-29 ENCOUNTER — Other Ambulatory Visit: Payer: Self-pay | Admitting: Internal Medicine

## 2018-08-29 DIAGNOSIS — B9789 Other viral agents as the cause of diseases classified elsewhere: Secondary | ICD-10-CM

## 2018-08-29 DIAGNOSIS — J22 Unspecified acute lower respiratory infection: Principal | ICD-10-CM

## 2018-08-29 NOTE — Telephone Encounter (Signed)
Needs refill on guaiFENesin-codeine (ROBITUSSIN AC) 100-10 MG/5ML syrup & nasuea medicine  [place], [place] - [address]. ;pt contact [phone]  [facility] outpatient pharmacy closed, pls to [place]

## 2018-08-30 MED ORDER — GUAIFENESIN-CODEINE 100-10 MG/5ML PO SYRP: 5.0000 mL | ORAL_SOLUTION | Freq: Three times a day (TID) | ORAL | 0 refills | Status: DC | PRN

## 2018-08-30 MED FILL — GUAIATUSSIN AC LIQUID: 100-10 | 120 days supply | Qty: 120 | Fill #0

## 2018-08-31 ENCOUNTER — Encounter: Payer: Self-pay | Admitting: Internal Medicine

## 2018-09-02 ENCOUNTER — Telehealth: Payer: Self-pay | Admitting: Internal Medicine

## 2018-09-02 DIAGNOSIS — J22 Unspecified acute lower respiratory infection: Principal | ICD-10-CM

## 2018-09-02 DIAGNOSIS — B9789 Other viral agents as the cause of diseases classified elsewhere: Secondary | ICD-10-CM

## 2018-09-02 NOTE — Telephone Encounter (Signed)
Pt would like call back about a medication refill and a placard form that was to be mailed to her.  guaiFENesin-codeine (ROBITUSSIN AC) 100-10 MG/5ML syrup  [place], [place]

## 2018-09-05 NOTE — Telephone Encounter (Signed)
Incoming call from pt asking about Robitussin AC)-refill was authorized on 08/30/18 and sent to [facility] now aware and will pick up medication today.Marland KitchenMarland KitchenRegenia Skeeter, Advaith Lamarque Cassady3/30/202011:20 AM

## 2018-09-13 ENCOUNTER — Telehealth (HOSPITAL_COMMUNITY): Payer: Self-pay

## 2018-09-13 NOTE — Telephone Encounter (Signed)
Returned patient's phone call. Left name and number to call back.

## 2018-09-14 ENCOUNTER — Other Ambulatory Visit: Payer: Self-pay | Admitting: Internal Medicine

## 2018-09-14 DIAGNOSIS — M797 Fibromyalgia: Secondary | ICD-10-CM

## 2018-09-14 MED FILL — PANTOPRAZOLE SOD DR 40 MG T: 40 | 30 days supply | Qty: 30 | Fill #0

## 2018-09-14 MED FILL — LISINOPRIL-HCTZ 20-25 MG TA: 20-25 | 30 days supply | Qty: 30 | Fill #0

## 2018-09-14 MED FILL — ONDANSETRON ODT 4 MG TABLET: 4 | 7 days supply | Qty: 20 | Fill #0

## 2018-09-16 MED FILL — CYCLOBENZAPRINE HCL 10 MG T: 10 | 15 days supply | Qty: 30 | Fill #0

## 2018-09-20 ENCOUNTER — Other Ambulatory Visit (HOSPITAL_COMMUNITY): Payer: Self-pay | Admitting: *Deleted

## 2018-09-20 DIAGNOSIS — Z1231 Encounter for screening mammogram for malignant neoplasm of breast: Secondary | ICD-10-CM

## 2018-10-21 ENCOUNTER — Other Ambulatory Visit: Payer: Self-pay | Admitting: Internal Medicine

## 2018-11-16 ENCOUNTER — Other Ambulatory Visit: Payer: Self-pay | Admitting: Internal Medicine

## 2018-11-16 MED FILL — SIMVASTATIN 40 MG TABLET: 40 | 30 days supply | Qty: 30 | Fill #2

## 2018-11-16 MED FILL — LISINOPRIL-HCTZ 20-25 MG TA: 20-25 | 30 days supply | Qty: 30 | Fill #0

## 2018-11-16 MED FILL — CYCLOBENZAPRINE 10 MG TAB: 10 | 15 days supply | Qty: 30 | Fill #0

## 2018-11-16 MED FILL — DICLOFENAC SODIUM 1 % GEL: 1 | 13 days supply | Qty: 100 | Fill #0

## 2018-11-16 MED FILL — PANTOPRAZOLE SOD DR 40 MG T: 40 | 30 days supply | Qty: 30 | Fill #0

## 2018-11-17 ENCOUNTER — Emergency Department (HOSPITAL_COMMUNITY): Payer: Self-pay

## 2018-11-17 ENCOUNTER — Emergency Department (HOSPITAL_COMMUNITY)
Admission: EM | Admit: 2018-11-17 | Discharge: 2018-11-17 | Disposition: A | Discharge status: Home / Self Care | Payer: Self-pay | Attending: Emergency Medicine | Admitting: Emergency Medicine

## 2018-11-17 ENCOUNTER — Encounter (HOSPITAL_COMMUNITY): Payer: Self-pay

## 2018-11-17 ENCOUNTER — Other Ambulatory Visit: Payer: Self-pay

## 2018-11-17 DIAGNOSIS — K529 Noninfective gastroenteritis and colitis, unspecified: Secondary | ICD-10-CM | POA: Insufficient documentation

## 2018-11-17 DIAGNOSIS — Z79899 Other long term (current) drug therapy: Secondary | ICD-10-CM | POA: Insufficient documentation

## 2018-11-17 DIAGNOSIS — Z8541 Personal history of malignant neoplasm of cervix uteri: Secondary | ICD-10-CM | POA: Insufficient documentation

## 2018-11-17 DIAGNOSIS — I10 Essential (primary) hypertension: Secondary | ICD-10-CM | POA: Insufficient documentation

## 2018-11-17 LAB — COMPREHENSIVE METABOLIC PANEL
ALT: 26 U/L (ref 0–44)
AST: 25 U/L (ref 15–41)
Albumin: 4.2 g/dL (ref 3.5–5.0)
Alkaline Phosphatase: 87 U/L (ref 38–126)
Anion gap: 12 (ref 5–15)
BUN: 10 mg/dL (ref 6–20)
CO2: 24 mmol/L (ref 22–32)
Calcium: 9.3 mg/dL (ref 8.9–10.3)
Chloride: 104 mmol/L (ref 98–111)
Creatinine, Ser: 0.8 mg/dL (ref 0.44–1.00)
GFR calc Af Amer: >60 mL/min (ref >60)
GFR calc non Af Amer: >60 mL/min (ref >60)
Glucose, Bld: 166 mg/dL — ABNORMAL HIGH (ref 70–99)
Potassium: 3.5 mmol/L (ref 3.5–5.1)
Sodium: 140 mmol/L (ref 135–145)
Total Bilirubin: 0.3 mg/dL (ref 0.3–1.2)
Total Protein: 7.8 g/dL (ref 6.5–8.1)

## 2018-11-17 LAB — CBC
HCT: 37.7 % (ref 36.0–46.0)
Hemoglobin: 12.4 g/dL (ref 12.0–15.0)
MCH: 31 pg (ref 26.0–34.0)
MCHC: 32.9 g/dL (ref 30.0–36.0)
MCV: 94.3 fL (ref 80.0–100.0)
Platelets: 465 10*3/uL — ABNORMAL HIGH (ref 150–400)
RBC: 4 MIL/uL (ref 3.87–5.11)
RDW: 13.4 % (ref 11.5–15.5)
WBC: 11.9 10*3/uL — ABNORMAL HIGH (ref 4.0–10.5)
nRBC: 0 % (ref 0.0–0.2)

## 2018-11-17 LAB — TROPONIN I: Troponin I: <0.03 ng/mL (ref <0.03)

## 2018-11-17 LAB — LIPASE, BLOOD: Lipase: 22 U/L (ref 11–51)

## 2018-11-17 MED ORDER — SODIUM CHLORIDE 0.9% FLUSH: 3.0000 mL | Freq: Once | INTRAVENOUS | Status: DC

## 2018-11-17 MED ORDER — PROMETHAZINE HCL 25 MG/ML IJ SOLN
25.0000 mg | Freq: Once | INTRAMUSCULAR | Status: AC
  Administered 2018-11-17: 25 mg via INTRAVENOUS
  Filled 2018-11-17: qty 1

## 2018-11-17 MED ORDER — PROMETHAZINE HCL 25 MG PO TABS: 25.0000 mg | ORAL_TABLET | Freq: Four times a day (QID) | ORAL | 0 refills | Status: DC | PRN

## 2018-11-17 MED ORDER — SODIUM CHLORIDE 0.9 % IV BOLUS
1000.0000 mL | Freq: Once | INTRAVENOUS | Status: AC
  Administered 2018-11-17: 12:00:00 1000 mL via INTRAVENOUS

## 2018-11-17 NOTE — ED Notes (Signed)
Patient transported to x-ray. ?

## 2018-11-17 NOTE — ED Provider Notes (Signed)
[facility] DEPT Provider Note   CSN: [id number] Arrival date & time: 11/17/18  1009     History   Chief Complaint Chief Complaint  Patient presents with   Abdominal Pain   Emesis   Chest Pain    HPI ABRIL CAPPIELLO is a 58 y.o. female.     HPI Patient presents to the emergency department with nausea vomiting and dizziness that started this morning.  Has not had any diarrhea or abdominal pain.  The patient states that the dizziness started after the vomiting.  Patient describes it as lightheadedness.  The patient denies chest pain, shortness of breath, headache,blurred vision, neck pain, fever, cough, weakness, numbness,  anorexia, edema, abdominal pain,  diarrhea, rash, back pain, dysuria, hematemesis, bloody stool, near syncope, or syncope. Past Medical History:  Diagnosis Date   Acid reflux disease    Anginal pain ([place])    last time 04/18/13 in afternoon; referred to [name]   Anxiety    Cancer [facility])    uterine- had hysterectomy   Chest pain 04/07/2011   Chronic pain    Cough 09/25/2011   Depression    Fibromyalgia    Headache(784.0) 12/27/2012   History of blood transfusion 1981   childbirth   History of kidney stones    passed   Hypertension    Influenza A 07/08/2017   Left wrist pain 03/10/2013   Migraine    last one was 04/18/13   Sleep apnea    Trichomonas contact, treated     Patient Active Problem List   Diagnosis Date Noted   Colon cancer screening 08/03/2018   Viral infection of lower respiratory system 08/03/2018   Breast cancer screening 08/03/2018   Solitary pulmonary nodule 08/03/2018   Anemia 06/20/2018   Depression 05/09/2018   Chronic pain 06/19/2014   Unspecified sleep apnea 05/18/2013   Calcaneus fracture, left 05/16/2013   Lumbar facet arthropathy 03/10/2013   Foot pain 07/07/2012   High cholesterol 09/10/2011   Borderline diabetes 09/10/2011   Obesity  09/02/2011   Fibromyalgia 03/24/2011   Anxiety 03/24/2011   Sleep disorder 03/24/2011   Hypertension 03/24/2011   GERD 01/05/2007    Past Surgical History:  Procedure Laterality Date   CHOLECYSTECTOMY     2008   ORIF CALCANEOUS FRACTURE Left 05/16/2013   Procedure: OPEN REDUCTION INTERNAL FIXATION (ORIF) LEFT CALCANEOUS FRACTURE;  Surgeon: Rozanna Box, MD;  Location: [place];  Service: Orthopedics;  Laterality: Left;   TOTAL ABDOMINAL HYSTERECTOMY     1986   WISDOM TOOTH EXTRACTION       OB History    Gravida  2   Para  2   Term  2   Preterm      AB      Living  2     SAB      TAB      Ectopic      Multiple      Live Births               Home Medications    Prior to Admission medications   Medication Sig Start Date End Date Taking? Authorizing Provider  acetaminophen (TYLENOL 8 HOUR) 650 MG CR tablet Take 1 tablet (650 mg total) by mouth every 8 (eight) hours as needed. 07/18/18   Varney Biles, MD  aspirin-acetaminophen-caffeine (EXCEDRIN MIGRAINE) [phone] MG tablet Take 2 tablets by mouth every 6 (six) hours as needed for headache.    [provider]  Aspirin-Salicylamide-Caffeine (BC HEADACHE POWDER PO) Take 1 Package by mouth as needed (headache).    [provider]  cyclobenzaprine (FLEXERIL) 10 MG tablet TAKE 1 TABLET BY MOUTH 2 TIMES DAILY AS NEEDED FOR MUSCLE SPASMS. Patient taking differently: Take 10 mg by mouth 2 (two) times daily as needed for muscle spasms.  09/16/18   Kathi Ludwig, MD  diclofenac sodium (VOLTAREN) 1 % GEL APPLY 2 GRAMS TOPICALLY 4 TIMES DAILY. Patient taking differently: Apply 2 g topically 4 (four) times daily.  11/16/18   Kathi Ludwig, MD  guaiFENesin (MUCINEX) 600 MG 12 hr tablet Take 600 mg by mouth 2 (two) times daily as needed for cough or to loosen phlegm.    [provider]  guaiFENesin-codeine (ROBITUSSIN AC) 100-10 MG/5ML syrup Take 5 mLs by mouth 3 (three) times  daily as needed for cough. 08/30/18   Kathi Ludwig, MD  ibuprofen (ADVIL,MOTRIN) 400 MG tablet Take 1 tablet (400 mg total) by mouth every 6 (six) hours as needed. 07/18/18   Varney Biles, MD  lisinopril-hydrochlorothiazide (PRINZIDE,ZESTORETIC) 20-25 MG tablet Take 1 tablet by mouth daily. 06/20/18   Kathi Ludwig, MD  mupirocin ointment (BACTROBAN) 2 % Place 1 application into the nose 2 (two) times daily as needed (leg pain).    [provider]  ondansetron (ZOFRAN ODT) 4 MG disintegrating tablet Take 1 tablet (4 mg total) by mouth every 8 (eight) hours as needed for nausea or vomiting. 07/18/18   Varney Biles, MD  pantoprazole (PROTONIX) 40 MG tablet Take 1 tablet (40 mg total) by mouth daily. 06/20/18   Kathi Ludwig, MD  Phenyleph-Doxylamine-DM-APAP (NYQUIL SEVERE COLD/FLU) 5-6.25-10-325 MG/15ML LIQD Take 30 mLs by mouth at bedtime as needed (sleep/congestion).    [provider]  promethazine (PHENERGAN) 12.5 MG tablet Take 1 tablet (12.5 mg total) by mouth daily as needed for nausea or vomiting. 06/20/18   Kathi Ludwig, MD  simvastatin (ZOCOR) 40 MG tablet Take 1 tablet (40 mg total) by mouth at bedtime. 06/20/18   Kathi Ludwig, MD    Family History Family History  Problem Relation Age of Onset   Diabetes Mother    Hypertension Mother    Depression Mother    Diabetes Sister    Hypertension Father    Cancer Father        Prostate   Alcohol abuse Father    Cancer Brother        Prostate    Social History Social History   Tobacco Use   Smoking status: Never Smoker   Smokeless tobacco: Never Used  Substance Use Topics   Alcohol use: No   Drug use: No     Allergies   Cymbalta [duloxetine hcl] and Gabapentin   Review of Systems Review of Systems All other systems negative except as documented in the HPI. All pertinent positives and negatives as reviewed in the HPI.  Physical Exam Updated Vital Signs BP (!)  102/51    Pulse 71    Temp 98.3 F (36.8 C) (Oral)    Resp 16    SpO2 100%   Physical Exam Vitals signs and nursing note reviewed.  Constitutional:      General: She is not in acute distress.    Appearance: She is well-developed.  HENT:     Head: Normocephalic and atraumatic.  Eyes:     Pupils: Pupils are equal, round, and reactive to light.  Neck:     Musculoskeletal: Normal range of motion and neck supple.  Cardiovascular:     Rate and Rhythm: Normal rate and regular rhythm.     Heart sounds: Normal heart sounds. No murmur. No friction rub. No gallop.   Pulmonary:     Effort: Pulmonary effort is normal. No respiratory distress.     Breath sounds: Normal breath sounds. No wheezing.  Abdominal:     General: Bowel sounds are normal. There is no distension.     Palpations: Abdomen is soft.     Tenderness: There is no abdominal tenderness.  Skin:    General: Skin is warm and dry.     Capillary Refill: Capillary refill takes less than 2 seconds.     Findings: No erythema or rash.  Neurological:     Mental Status: She is alert and oriented to person, place, and time.     Motor: No abnormal muscle tone.     Coordination: Coordination normal.  Psychiatric:        Behavior: Behavior normal.      ED Treatments / Results  Labs (all labs ordered are listed, but only abnormal results are displayed) Labs Reviewed  COMPREHENSIVE METABOLIC PANEL - Abnormal; Notable for the following components:      Result Value   Glucose, Bld 166 (*)    All other components within normal limits  CBC - Abnormal; Notable for the following components:   WBC 11.9 (*)    Platelets 465 (*)    All other components within normal limits  LIPASE, BLOOD  TROPONIN I  URINALYSIS, ROUTINE W REFLEX MICROSCOPIC    EKG EKG Interpretation  Date/Time:  Thursday November 17 2018 10:36:00 EDT Ventricular Rate:  103 PR Interval:    QRS Duration: 117 QT Interval:  371 QTC Calculation: 486 R Axis:   29 Text  Interpretation:  Sinus tachycardia Incomplete right bundle branch block No significant change since last tracing Confirmed by Wandra Arthurs [phone]) on 11/17/2018 10:40:42 AM   Radiology Dg Chest 2 View  Result Date: 11/17/2018 CLINICAL DATA:  Onset nausea and vomiting this morning. EXAM: CHEST - 2 VIEW COMPARISON:  PA and lateral chest 07/18/2018 and 10/10/2015. FINDINGS: The lungs are clear. Heart size is normal. No pneumothorax or pleural effusion. No acute or focal bony abnormality IMPRESSION: Normal chest. Electronically Signed   By: Inge Rise M.D.   On: 11/17/2018 11:55    Procedures Procedures (including critical care time)  Medications Ordered in ED Medications  sodium chloride flush (NS) 0.9 % injection 3 mL (has no administration in time range)  promethazine (PHENERGAN) injection 25 mg (25 mg Intravenous Given 11/17/18 1113)  sodium chloride 0.9 % bolus 1,000 mL (1,000 mLs Intravenous New Bag/Given 11/17/18 1152)     Initial Impression / Assessment and Plan / ED Course  I have reviewed the triage vital signs and the nursing notes.  Pertinent labs & imaging results that were available during my care of the patient were reviewed by me and considered in my medical decision making (see chart for details).        Patient most likely has gastroenteritis causing her symptoms.  Patient has been stable since she received Phenergan.  Her vital signs have remained stable.  She is been given IV fluids.  Patient will be discharged home with Phenergan and told to return here as needed.  Patient's laboratory testing is negative.  Final Clinical Impressions(s) / ED Diagnoses   Final diagnoses:  None    ED Discharge Orders    None  Dalia Heading, PA-C 11/17/18 1510    Drenda Freeze, MD 11/18/18 1452

## 2018-11-17 NOTE — ED Triage Notes (Addendum)
Patient dropped off by family.   Patient consistently vomiting in triage.   C/O Emesis that started after waking up this morning and feeling dizzy, 10 occurrences of emesis since this morning.   Denies abdominal pain Denies diarrhea.   Patient states she has been off her medications for 3 weeks because she has not had the money to buy them .   Patient also c/o chest pain intermittently for past "few" days.    A/Ox4  Wheelchair in triage.    Daughter would like to be updated.

## 2018-11-17 NOTE — Discharge Instructions (Addendum)
Return here as needed.  Follow-up with your doctor for a recheck.  Slowly increase your fluid intake.

## 2018-12-15 ENCOUNTER — Other Ambulatory Visit: Payer: Self-pay | Admitting: Internal Medicine

## 2018-12-15 DIAGNOSIS — M797 Fibromyalgia: Secondary | ICD-10-CM

## 2018-12-15 DIAGNOSIS — R110 Nausea: Secondary | ICD-10-CM

## 2018-12-15 MED FILL — LISINOPRIL-HCTZ 20-25 MG TA: 20-25 | 30 days supply | Qty: 30 | Fill #1

## 2018-12-15 MED FILL — DICLOFENAC SODIUM 1 % GEL: 1 | 13 days supply | Qty: 100 | Fill #1

## 2018-12-15 MED FILL — PANTOPRAZOLE SOD DR 40 MG T: 40 | 30 days supply | Qty: 30 | Fill #1

## 2018-12-28 ENCOUNTER — Ambulatory Visit (INDEPENDENT_AMBULATORY_CARE_PROVIDER_SITE_OTHER): Payer: Self-pay | Admitting: Internal Medicine

## 2018-12-28 ENCOUNTER — Other Ambulatory Visit: Payer: Self-pay

## 2018-12-28 ENCOUNTER — Encounter: Payer: Self-pay | Admitting: Internal Medicine

## 2018-12-28 VITALS — BP 138/81 | HR 98 | Temp 98.1°F | Wt 180.2 lb

## 2018-12-28 DIAGNOSIS — Z1159 Encounter for screening for other viral diseases: Secondary | ICD-10-CM

## 2018-12-28 DIAGNOSIS — E7800 Pure hypercholesterolemia, unspecified: Secondary | ICD-10-CM

## 2018-12-28 DIAGNOSIS — F5101 Primary insomnia: Secondary | ICD-10-CM

## 2018-12-28 DIAGNOSIS — R911 Solitary pulmonary nodule: Secondary | ICD-10-CM

## 2018-12-28 DIAGNOSIS — M797 Fibromyalgia: Secondary | ICD-10-CM

## 2018-12-28 DIAGNOSIS — I10 Essential (primary) hypertension: Secondary | ICD-10-CM

## 2018-12-28 DIAGNOSIS — E8881 Metabolic syndrome: Secondary | ICD-10-CM

## 2018-12-28 DIAGNOSIS — G4700 Insomnia, unspecified: Secondary | ICD-10-CM

## 2018-12-28 DIAGNOSIS — Z79899 Other long term (current) drug therapy: Secondary | ICD-10-CM

## 2018-12-28 DIAGNOSIS — R079 Chest pain, unspecified: Secondary | ICD-10-CM

## 2018-12-28 DIAGNOSIS — Z124 Encounter for screening for malignant neoplasm of cervix: Secondary | ICD-10-CM

## 2018-12-28 DIAGNOSIS — Z1211 Encounter for screening for malignant neoplasm of colon: Secondary | ICD-10-CM

## 2018-12-28 LAB — POCT GLYCOSYLATED HEMOGLOBIN (HGB A1C): Hemoglobin A1C: 6.4 % — AB (ref 4.0–5.6)

## 2018-12-28 LAB — GLUCOSE, CAPILLARY: Glucose-Capillary: 144 mg/dL — ABNORMAL HIGH (ref 70–99)

## 2018-12-28 NOTE — Progress Notes (Signed)
   CC: Fibromyalgia back pain  HPI:[name] is a 58 y.o. female who presents for evaluation of Fibromyalgia. Please see individual problem based A/P for details.  Past Medical History:  Diagnosis Date  . Acid reflux disease   . Anginal pain ([place])    last time 04/18/13 in afternoon; referred to [name]  . Anxiety   . Cancer [facility])    uterine- had hysterectomy  . Chest pain 04/07/2011  . Chronic pain   . Cough 09/25/2011  . Depression   . Fibromyalgia   . Headache(784.0) 12/27/2012  . History of blood transfusion 1981   childbirth  . History of kidney stones    passed  . Hypertension   . Influenza A 07/08/2017  . Left wrist pain 03/10/2013  . Migraine    last one was 04/18/13  . Sleep apnea   . Trichomonas contact, treated    Review of Systems:  ROS negative except as per HPI.  Physical Exam: Vitals:   12/28/18 1523  BP: 138/81  Pulse: 98  Temp: 98.1 F (36.7 C)  TempSrc: Oral  SpO2: 100%  Weight: 180 lb 3.2 oz (81.7 kg)   General: A/O x4, in no acute distress, afebrile, nondiaphoretic HEENT: PEERL, EMO intact Cardio: RRR, no mrg's  Pulmonary: CTA bilaterally, no wheezing or crackles  Abdomen: Bowel sounds normal, soft, nontender  MSK: BLE nontender, nonedematous Neuro: Alert, normal gait Psych: Appropriate affect, not depressed in appearance, engages well  Assessment & Plan:   See Encounters Tab for problem based charting.  Patient discussed with Dr. Angelia Mould

## 2018-12-28 NOTE — Patient Instructions (Signed)
FOLLOW-UP INSTRUCTIONS When: 1-2 months For: Routine visit What to bring: All of your medications  I have made not made any changes to your medications today.   Today we discussed your fibromyalgia, finger pain, sleep disorder, chest discomfort, and the need for a CT of the lungs for the incidental pulmonary nodule that was found in February. I will notify you of the results of any labs from today's evaluation when available to me.   Thank you for your visit to the [facility] today. If you have any questions or concerns please call us at [phone].

## 2018-12-29 ENCOUNTER — Encounter: Payer: Self-pay | Admitting: Internal Medicine

## 2018-12-29 DIAGNOSIS — Z131 Encounter for screening for diabetes mellitus: Secondary | ICD-10-CM | POA: Insufficient documentation

## 2018-12-29 DIAGNOSIS — G4700 Insomnia, unspecified: Secondary | ICD-10-CM | POA: Insufficient documentation

## 2018-12-29 DIAGNOSIS — Z124 Encounter for screening for malignant neoplasm of cervix: Secondary | ICD-10-CM | POA: Insufficient documentation

## 2018-12-29 DIAGNOSIS — Z1159 Encounter for screening for other viral diseases: Secondary | ICD-10-CM | POA: Insufficient documentation

## 2018-12-29 LAB — BMP8+ANION GAP
Anion Gap: 20 mmol/L — ABNORMAL HIGH (ref 10.0–18.0)
BUN/Creatinine Ratio: 14 (ref 9–23)
BUN: 13 mg/dL (ref 6–24)
CO2: 22 mmol/L (ref 20–29)
Calcium: 10.3 mg/dL — ABNORMAL HIGH (ref 8.7–10.2)
Chloride: 100 mmol/L (ref 96–106)
Creatinine, Ser: 0.95 mg/dL (ref 0.57–1.00)
GFR calc Af Amer: 77 mL/min/{1.73_m2} (ref >59)
GFR calc non Af Amer: 67 mL/min/{1.73_m2} (ref >59)
Glucose: 162 mg/dL — ABNORMAL HIGH (ref 65–99)
Potassium: 4.3 mmol/L (ref 3.5–5.2)
Sodium: 142 mmol/L (ref 134–144)

## 2018-12-29 LAB — LIPID PANEL
Chol/HDL Ratio: 5.1 ratio — ABNORMAL HIGH (ref 0.0–4.4)
Cholesterol, Total: 209 mg/dL — ABNORMAL HIGH (ref 100–199)
HDL: 41 mg/dL (ref >39)
LDL Calculated: 138 mg/dL — ABNORMAL HIGH (ref 0–99)
Triglycerides: 152 mg/dL — ABNORMAL HIGH (ref 0–149)
VLDL Cholesterol Cal: 30 mg/dL (ref 5–40)

## 2018-12-29 LAB — HEPATITIS C ANTIBODY: Hep C Virus Ab: <0.1 s/co ratio (ref 0.0–0.9)

## 2018-12-29 NOTE — Assessment & Plan Note (Signed)
Chest pain: Began with the loss of her brother in April of this year. She denied increasing symptoms with exercise but stated that it was better with being in a quiet, dark, and calm room. Possibly better when she takes an ASA tablet. It is left sided at times described as sharp without associated symptoms denying dyspnea, crushing pain, cough, fever, diaphoresis, arm/neck pain or other symptoms. We discussed exercise stress testing but she stated that it appeared to be improving with time the more distant her brothers passing becomes.   Plan:  Consider exercise stress testing if her symptoms worsen, fail to improve or she is agreeable.

## 2018-12-29 NOTE — Assessment & Plan Note (Signed)
Colon cancer screening: Patient did not complete at last visit. She needs to pick up the FIT test.   Plan: Patient will pick up FIT test and deliver it to the lab this time.

## 2018-12-29 NOTE — Assessment & Plan Note (Signed)
Hypertension: Patient's BP today is 138/81 with a goal of <140/80. The patient endorses adherence to her medication regimen. She denied, chest pain, headache, visual changes, lightheadedness, weakness, dizziness on standing, swelling in the feet or ankles.   Plan: Continue Lisinopril/HCTZ combo 20-25mg  daily tablet BMP today  Addendum: BMP unremarkable except for ever present anion gap which only appears from our clinics lab. Calcium slightly elevated to 10.3. Will repeat at next visit. sCr stable at 0.95.

## 2018-12-29 NOTE — Assessment & Plan Note (Signed)
Insomnia: Sleep onset prolonged as well as sleep length decreased.  Sleep hygiene patient feels that she does not drink alcohol, does not use caffeine products, does not exercise before bed, nor does she utilize other stimulants such as tobacco before bed.  She does endorse consistently staying in her room during the day, watching television while in bed during while trying to fall asleep into the early morning and using her bedroom as a place to hide from her grandchildren when she becomes overwhelmed.  I advised the patient that it bed should be used only for sex and sleep.  I further advised her that she should stop using her television, or stop watching her television, stop using a cell phone, and avoid all bright light stimulants approximately 2 hours before bed.  In addition I advised her that there is some utility utilizing a 5 mg dose of melatonin approximately 2 hours before bed if used consistently over a 2-week period.  She agreed to attempt the above activities.

## 2018-12-29 NOTE — Assessment & Plan Note (Signed)
  Fibromyalgia: Pain in the neck girdle, shoulders, lumbar back, forearms, fatigue, insomnia, PIP joint areas which are unremarkable in appearance and exam, with intermittent nausea and abdominal discomfort. She has tried Cymbalta and gabapentin both of which reportedly induce severe side effects.   Plan:  Will recommend amitriptyline

## 2018-12-29 NOTE — Assessment & Plan Note (Signed)
Ordered CT chest to better assess the single pulmonary nodule seen on xray in February 2020

## 2018-12-29 NOTE — Assessment & Plan Note (Signed)
Patient agreed to consider at her next visit.

## 2018-12-29 NOTE — Assessment & Plan Note (Signed)
HgbA1c elevated to 6.4%, will recommend metformin therapy and further lifestyle modifications at next visit.

## 2018-12-29 NOTE — Assessment & Plan Note (Signed)
Negative.

## 2018-12-30 ENCOUNTER — Other Ambulatory Visit: Payer: Self-pay | Admitting: Internal Medicine

## 2018-12-30 DIAGNOSIS — M797 Fibromyalgia: Secondary | ICD-10-CM

## 2018-12-30 DIAGNOSIS — R110 Nausea: Secondary | ICD-10-CM

## 2018-12-30 MED FILL — CYCLOBENZAPRINE 10 MG TAB: 10 | 15 days supply | Qty: 30 | Fill #0

## 2018-12-30 MED FILL — PROMETHAZINE 12.5 MG TABLET: 12.5 | 30 days supply | Qty: 30 | Fill #0

## 2018-12-30 NOTE — Progress Notes (Signed)
Internal Medicine Clinic Attending  I saw and evaluated the patient.  I personally confirmed the key portions of the history and exam documented by Dr. Harbrecht and I reviewed pertinent patient test results.  The assessment, diagnosis, and plan were formulated together and I agree with the documentation in the resident's note.  

## 2019-01-16 MED FILL — PANTOPRAZOLE SOD DR 40 MG T: 40 | 30 days supply | Qty: 30 | Fill #2

## 2019-01-16 MED FILL — LISINOPRIL-HCTZ 20-25 MG TA: 20-25 | 30 days supply | Qty: 30 | Fill #2

## 2019-01-23 ENCOUNTER — Encounter: Payer: Self-pay | Admitting: Internal Medicine

## 2019-01-23 ENCOUNTER — Ambulatory Visit: Payer: Self-pay

## 2019-01-26 ENCOUNTER — Ambulatory Visit
Admission: RE | Admit: 2019-01-26 | Discharge: 2019-01-26 | Disposition: A | Discharge status: Home / Self Care | Payer: No Typology Code available for payment source | Source: Ambulatory Visit | Attending: Obstetrics and Gynecology | Admitting: Obstetrics and Gynecology

## 2019-01-26 ENCOUNTER — Encounter (HOSPITAL_COMMUNITY): Payer: Self-pay

## 2019-01-26 ENCOUNTER — Other Ambulatory Visit: Payer: Self-pay

## 2019-01-26 ENCOUNTER — Ambulatory Visit (HOSPITAL_COMMUNITY)
Admission: RE | Admit: 2019-01-26 | Discharge: 2019-01-26 | Disposition: A | Discharge status: Home / Self Care | Payer: Self-pay | Source: Ambulatory Visit | Attending: Obstetrics and Gynecology | Admitting: Obstetrics and Gynecology

## 2019-01-26 DIAGNOSIS — Z0000 Encounter for general adult medical examination without abnormal findings: Secondary | ICD-10-CM | POA: Insufficient documentation

## 2019-01-26 DIAGNOSIS — Z1231 Encounter for screening mammogram for malignant neoplasm of breast: Secondary | ICD-10-CM

## 2019-01-26 DIAGNOSIS — Z1239 Encounter for other screening for malignant neoplasm of breast: Secondary | ICD-10-CM

## 2019-01-26 NOTE — Progress Notes (Signed)
No complaints today.   Pap Smear: Pap smear not completed today. Last Pap smear was 8 years ago at [facility] and normal with negative HPV. Per patient has no history of an abnormal Pap smear. Patient has a history of a hysterectomy in 1985 due to fibroids and uterine cancer per patient. Patient doesn't need any further Pap smears due to her hysterectomy was completed 35 years ago with no history of abnormal Pap smears per BCCCP and ACOG guidelines. No Pap smear results are in Epic.  Physical exam: Breasts Right breast is slightly larger than left breast that per patient has not noticed any changes. No skin abnormalities bilateral breasts. No nipple retraction bilateral breasts. No nipple discharge bilateral breasts. No lymphadenopathy. No lumps palpated bilateral breasts. No complaints of pain or tenderness on exam. Referred patient to the [facility] for a screening mammogram. Appointment scheduled for Thursday, January 26, 2019 at 1610.        Pelvic/Bimanual No Pap smear completed today since patient has a history of a hysterectomy 35 years ago. Pap smear not indicated per BCCCP guidelines.   Smoking History: Patient has never smoked.  Patient Navigation: Patient education provided. Access to services provided for patient through [facility] program.   Colorectal Cancer Screening: Per patient has never had a colonoscopy completed. No complaints today.  Breast and Cervical Cancer Risk Assessment: Patient has no family history of breast cancer, known genetic mutations, or radiation treatment to the chest before age 14. Patient has no history of cervical dysplasia, immunocompromised, or DES exposure in-utero.  Risk Assessment    Risk Scores      01/26/2019   Last edited by: Armond Hang, LPN   5-year risk: 1.4 %   Lifetime risk: 7.7 %

## 2019-01-26 NOTE — Patient Instructions (Signed)
Explained breast self awareness with [name]. Patient did not need a Pap smear today due to patient has a history of a hysterectomy 35 years ago.Let patient know that she doesn't need any further Pap smears due to hre history of a hysterectomy 35 years ago. Referred patient to the [facility] for a screening mammogram. Appointment scheduled for Thursday, January 26, 2019 at 1610. Patient aware of appointment and will be there. Let patient know the [facility] will follow up with her within the next couple weeks with results of mammogram by letter or phone. [name] verbalized understanding.  Sheilyn Boehlke, Arvil Chaco, RN 3:19 PM

## 2019-01-31 ENCOUNTER — Encounter (HOSPITAL_COMMUNITY): Payer: Self-pay | Admitting: *Deleted

## 2019-02-02 MED FILL — CYCLOBENZAPRINE 10 MG TAB: 10 | 15 days supply | Qty: 30 | Fill #1

## 2019-02-14 ENCOUNTER — Other Ambulatory Visit: Payer: Self-pay | Admitting: Internal Medicine

## 2019-02-14 DIAGNOSIS — R110 Nausea: Secondary | ICD-10-CM

## 2019-02-14 MED FILL — PANTOPRAZOLE SOD DR 40 MG T: 40 | 30 days supply | Qty: 30 | Fill #3

## 2019-02-14 MED FILL — SIMVASTATIN 40 MG TABLET: 40 | 30 days supply | Qty: 30 | Fill #3

## 2019-02-14 MED FILL — LISINOPRIL-HCTZ 20-25 MG TA: 20-25 | 30 days supply | Qty: 30 | Fill #3

## 2019-02-15 MED FILL — DICLOFENAC SODIUM 1 % GEL: 1 | 13 days supply | Qty: 100 | Fill #0

## 2019-02-15 MED FILL — PROMETHAZINE 12.5 MG TABLET: 12.5 | 30 days supply | Qty: 30 | Fill #0

## 2019-03-01 ENCOUNTER — Other Ambulatory Visit: Payer: Self-pay | Admitting: Internal Medicine

## 2019-03-01 DIAGNOSIS — M797 Fibromyalgia: Secondary | ICD-10-CM

## 2019-03-01 MED FILL — CYCLOBENZAPRINE 10 MG TAB: 10 | 15 days supply | Qty: 30 | Fill #0

## 2019-03-10 MED FILL — CYCLOBENZAPRINE 10 MG TAB: 10 | 15 days supply | Qty: 30 | Fill #0

## 2019-03-20 ENCOUNTER — Other Ambulatory Visit: Payer: Self-pay | Admitting: Internal Medicine

## 2019-03-20 DIAGNOSIS — R110 Nausea: Secondary | ICD-10-CM

## 2019-03-20 MED FILL — LISINOPRIL-HCTZ 20-25 MG TA: 20-25 | 30 days supply | Qty: 30 | Fill #4

## 2019-03-20 MED FILL — PANTOPRAZOLE SOD DR 40 MG T: 40 | 30 days supply | Qty: 30 | Fill #4

## 2019-03-20 MED FILL — SIMVASTATIN 40 MG TABLET: 40 | 30 days supply | Qty: 30 | Fill #4

## 2019-03-22 MED FILL — PROMETHAZINE 12.5 MG TABLET: 12.5 | 30 days supply | Qty: 30 | Fill #0

## 2019-04-21 ENCOUNTER — Other Ambulatory Visit: Payer: Self-pay | Admitting: Internal Medicine

## 2019-04-21 DIAGNOSIS — R110 Nausea: Secondary | ICD-10-CM

## 2019-04-21 MED FILL — CYCLOBENZAPRINE 10 MG TAB: 10 | 15 days supply | Qty: 30 | Fill #1

## 2019-04-21 MED FILL — DICLOFENAC SODIUM 1 % GEL: 1 | 13 days supply | Qty: 100 | Fill #1

## 2019-04-21 MED FILL — PANTOPRAZOLE SOD DR 40 MG T: 40 | 30 days supply | Qty: 30 | Fill #5

## 2019-04-21 MED FILL — SIMVASTATIN 40 MG TABLET: 40 | 30 days supply | Qty: 30 | Fill #5

## 2019-04-21 MED FILL — LISINOPRIL-HCTZ 20-25 MG TA: 20-25 | 30 days supply | Qty: 30 | Fill #5

## 2019-04-24 MED FILL — PROMETHAZINE 12.5 MG TABLET: 12.5 | 30 days supply | Qty: 30 | Fill #0

## 2019-05-24 ENCOUNTER — Other Ambulatory Visit: Payer: Self-pay | Admitting: Internal Medicine

## 2019-05-24 DIAGNOSIS — R110 Nausea: Secondary | ICD-10-CM

## 2019-05-24 DIAGNOSIS — M797 Fibromyalgia: Secondary | ICD-10-CM

## 2019-05-24 MED FILL — PANTOPRAZOLE SOD DR 40 MG T: 40 | 30 days supply | Qty: 30 | Fill #6

## 2019-05-24 MED FILL — LISINOPRIL-HCTZ 20-25 MG TA: 20-25 | 30 days supply | Qty: 30 | Fill #6

## 2019-05-26 MED FILL — DICLOFENAC SODIUM 1 % GEL: 1 | 13 days supply | Qty: 100 | Fill #0

## 2019-05-26 MED FILL — CYCLOBENZAPRINE 10 MG TAB: 10 | 15 days supply | Qty: 30 | Fill #0

## 2019-05-26 MED FILL — PROMETHAZINE 12.5 MG TABLET: 12.5 | 30 days supply | Qty: 30 | Fill #0

## 2019-06-23 ENCOUNTER — Other Ambulatory Visit: Payer: Self-pay | Admitting: Internal Medicine

## 2019-06-23 DIAGNOSIS — E7800 Pure hypercholesterolemia, unspecified: Secondary | ICD-10-CM

## 2019-06-23 DIAGNOSIS — K219 Gastro-esophageal reflux disease without esophagitis: Secondary | ICD-10-CM

## 2019-06-23 DIAGNOSIS — R110 Nausea: Secondary | ICD-10-CM

## 2019-06-23 DIAGNOSIS — I10 Essential (primary) hypertension: Secondary | ICD-10-CM

## 2019-06-23 MED FILL — CYCLOBENZAPRINE 10 MG TAB: 10 | 15 days supply | Qty: 30 | Fill #1

## 2019-06-26 MED FILL — LISINOPRIL-HCTZ 20-25 MG TA: 20-25 | 30 days supply | Qty: 30 | Fill #0

## 2019-06-26 MED FILL — SIMVASTATIN 40 MG TABLET: 40 | 30 days supply | Qty: 30 | Fill #0

## 2019-06-26 MED FILL — PROMETHAZINE 12.5 MG TABLET: 12.5 | 30 days supply | Qty: 30 | Fill #0

## 2019-06-26 MED FILL — PANTOPRAZOLE SOD DR 40 MG T: 40 | 30 days supply | Qty: 30 | Fill #0

## 2019-07-13 ENCOUNTER — Other Ambulatory Visit: Payer: No Typology Code available for payment source

## 2019-07-14 ENCOUNTER — Ambulatory Visit: Payer: 59 | Attending: Internal Medicine

## 2019-07-14 DIAGNOSIS — Z20822 Contact with and (suspected) exposure to covid-19: Secondary | ICD-10-CM | POA: Insufficient documentation

## 2019-07-16 LAB — NOVEL CORONAVIRUS, NAA

## 2019-07-17 ENCOUNTER — Telehealth: Payer: Self-pay

## 2019-07-17 NOTE — Telephone Encounter (Signed)
Called pt and LM on VM that she was informed by me that her results were positive for covid. Advised pt results were indeterminate and needs to retest due to not enough sample was taken at the previous testing. Call back number provided. Sent pt a note via MyChart as well.

## 2019-07-18 ENCOUNTER — Ambulatory Visit (INDEPENDENT_AMBULATORY_CARE_PROVIDER_SITE_OTHER): Payer: 59 | Admitting: Internal Medicine

## 2019-07-18 ENCOUNTER — Encounter: Payer: Self-pay | Admitting: Internal Medicine

## 2019-07-18 ENCOUNTER — Other Ambulatory Visit: Payer: Self-pay

## 2019-07-18 VITALS — BP 130/78 | HR 102 | Temp 99.8°F | Wt 179.4 lb

## 2019-07-18 DIAGNOSIS — U071 COVID-19: Secondary | ICD-10-CM | POA: Insufficient documentation

## 2019-07-18 LAB — CBC WITH DIFFERENTIAL/PLATELET
Abs Immature Granulocytes: 0.01 10*3/uL (ref 0.00–0.07)
Basophils Absolute: 0 10*3/uL (ref 0.0–0.1)
Basophils Relative: 0 %
Eosinophils Absolute: 0 10*3/uL (ref 0.0–0.5)
Eosinophils Relative: 0 %
HCT: 38.5 % (ref 36.0–46.0)
Hemoglobin: 12.5 g/dL (ref 12.0–15.0)
Immature Granulocytes: 0 %
Lymphocytes Relative: 35 %
Lymphs Abs: 1.3 10*3/uL (ref 0.7–4.0)
MCH: 29.7 pg (ref 26.0–34.0)
MCHC: 32.5 g/dL (ref 30.0–36.0)
MCV: 91.4 fL (ref 80.0–100.0)
Monocytes Absolute: 0.3 10*3/uL (ref 0.1–1.0)
Monocytes Relative: 7 %
Neutro Abs: 2.2 10*3/uL (ref 1.7–7.7)
Neutrophils Relative %: 58 %
Platelets: 336 10*3/uL (ref 150–400)
RBC: 4.21 MIL/uL (ref 3.87–5.11)
RDW: 13.6 % (ref 11.5–15.5)
WBC: 3.8 10*3/uL — ABNORMAL LOW (ref 4.0–10.5)
nRBC: 0 % (ref 0.0–0.2)

## 2019-07-18 LAB — COMPREHENSIVE METABOLIC PANEL
ALT: 57 U/L — ABNORMAL HIGH (ref 0–44)
AST: 44 U/L — ABNORMAL HIGH (ref 15–41)
Albumin: 3.7 g/dL (ref 3.5–5.0)
Alkaline Phosphatase: 94 U/L (ref 38–126)
Anion gap: 13 (ref 5–15)
BUN: 8 mg/dL (ref 6–20)
CO2: 29 mmol/L (ref 22–32)
Calcium: 9.5 mg/dL (ref 8.9–10.3)
Chloride: 95 mmol/L — ABNORMAL LOW (ref 98–111)
Creatinine, Ser: 0.97 mg/dL (ref 0.44–1.00)
GFR calc Af Amer: >60 mL/min (ref >60)
GFR calc non Af Amer: >60 mL/min (ref >60)
Glucose, Bld: 120 mg/dL — ABNORMAL HIGH (ref 70–99)
Potassium: 3.5 mmol/L (ref 3.5–5.1)
Sodium: 137 mmol/L (ref 135–145)
Total Bilirubin: 0.3 mg/dL (ref 0.3–1.2)
Total Protein: 7.9 g/dL (ref 6.5–8.1)

## 2019-07-18 LAB — FERRITIN: Ferritin: 253 ng/mL (ref 11–307)

## 2019-07-18 LAB — D-DIMER, QUANTITATIVE: D-Dimer, Quant: 0.43 ug/mL-FEU (ref 0.00–0.50)

## 2019-07-18 MED ORDER — GUAIFENESIN-DM 100-10 MG/5ML PO SYRP: 5.0000 mL | ORAL_SOLUTION | Freq: Four times a day (QID) | ORAL | 0 refills | Status: DC | PRN

## 2019-07-18 MED ORDER — PROMETHAZINE HCL 12.5 MG PO TABS: 12.5000 mg | ORAL_TABLET | Freq: Four times a day (QID) | ORAL | 0 refills | Status: DC | PRN

## 2019-07-18 MED FILL — PROMETHAZINE 12.5 MG TABLET: 12.5 | 14 days supply | Qty: 56 | Fill #0

## 2019-07-18 NOTE — Patient Instructions (Addendum)
Sherry [name],   Your COVID test was indeterminate, this means it detected some virus in your bloodstream but not enough to result positive. Unfortunately, this means you do have the virus and will need to take all the precautions we discussed during gyoru visit. Everyone who lives or has interacted with you in the past few days should be tested or re-tested for COVID. Please make sure to self-isolate for the next 14 days, until  February 22.   I sent a prescription for Robitussin with dextromethorphan to your pharmacy.  You can use this every 4-6 hours as needed for cough.  For the nausea I sent a prescription for Phenergan that you can take every 6 hours as needed.  For the fever please take Tylenol 500 mg every 6 hours as needed as well.  This should also help with the headaches.  We will give you call in 2 to 3 days to make sure you are doing okay getting better.  In the meantime you can call us if you have any questions or concerns.    If you were to develop shortness of breath or a cough productive of sputum please go to the emergency department to be evaluated for pneumonia.  -Dr. Frederico Hamman

## 2019-07-19 ENCOUNTER — Encounter: Payer: Self-pay | Admitting: Internal Medicine

## 2019-07-19 NOTE — Assessment & Plan Note (Signed)
Patient presents for evaluation of a 5-day history of a dry cough that is associated with fever, chills, abdominal pain, nausea, and headaches.  She reports being tested for Covid-19 on 2/5 which showed up as negative on her my chart.  However, when I reviewed her record today her COVID-19 test resulted as indeterminate which is considered to be a positive result. There is a message from RN Carlisle Beers stating this as well as follow up instructions. She never received this message. Unfortunately, she has now exposed her family to the virus due this issue.   She is not feeling well today.  She is fatigued and experiencing significant abdominal pain and nausea.  Her vital signs are normal and her physical exam is remarkable which is reassuring.  - CBC, CMP, D-dimer, and ferritin  - Tylenol for fever and HA  - Robitussin for cough and phenergan for nausea  - Telehealth follow up in 2-3 days  - Advised to quarantine for the next 2 weeks. [employer] provided.  - Recommended exposed family members and co-workers to get tested  - Safety zone portal submitted to look into this

## 2019-07-19 NOTE — Progress Notes (Signed)
   CC: Cough and abdominal pain   HPI:  [name] is a 59 y.o. year-old female with PMH listed below who presents to clinic for cough and abdominal pain. Please see problem based assessment and plan for further details.   Past Medical History:  Diagnosis Date  . Acid reflux disease   . Anginal pain ([place])    last time 04/18/13 in afternoon; referred to [name]  . Anxiety   . Cancer [facility])    uterine- had hysterectomy  . Chest pain 04/07/2011  . Chronic pain   . Chronic pain 06/19/2014  . Cough 09/25/2011  . Depression   . Fibromyalgia   . Headache(784.0) 12/27/2012  . History of blood transfusion 1981   childbirth  . History of kidney stones    passed  . Hypertension   . Influenza A 07/08/2017  . Left wrist pain 03/10/2013  . Migraine    last one was 04/18/13  . Sleep apnea   . Trichomonas contact, treated    Review of Systems:   Review of Systems  Constitutional: Positive for chills, fever and malaise/fatigue.  Respiratory: Positive for cough. Negative for sputum production and shortness of breath.   Cardiovascular: Negative for chest pain.  Gastrointestinal: Positive for abdominal pain and nausea.    Physical Exam:  Vitals:   07/18/19 0913  BP: 130/78  Pulse: (!) 102  Temp: 99.8 F (37.7 C)  TempSrc: Oral  SpO2: 99%  Weight: 179 lb 6.4 oz (81.4 kg)    General: Tired appearing female in no acute distress Cardiac: regular rate and rhythm, nl S1/S2, no murmurs, rubs or gallops Pulm: CTAB, no wheezes or crackles, no increased work of breathing on room air  Abd: soft, NTND, bowel sounds are normoactive Ext: warm and well perfused, no peripheral edema   Assessment & Plan:   See Encounters Tab for problem based charting.  Patient discussed with Dr. Philipp Ovens

## 2019-07-20 NOTE — Progress Notes (Signed)
Internal Medicine Clinic Attending  Case discussed with Dr. Santos-Sanchez at the time of the visit.  We reviewed the resident's history and exam and pertinent patient test results.  I agree with the assessment, diagnosis, and plan of care documented in the resident's note.    

## 2019-07-25 ENCOUNTER — Other Ambulatory Visit: Payer: Self-pay | Admitting: Internal Medicine

## 2019-07-25 ENCOUNTER — Ambulatory Visit (INDEPENDENT_AMBULATORY_CARE_PROVIDER_SITE_OTHER): Payer: 59 | Admitting: Internal Medicine

## 2019-07-25 ENCOUNTER — Other Ambulatory Visit: Payer: Self-pay

## 2019-07-25 DIAGNOSIS — U071 COVID-19: Secondary | ICD-10-CM

## 2019-07-25 DIAGNOSIS — M797 Fibromyalgia: Secondary | ICD-10-CM

## 2019-07-25 MED FILL — PANTOPRAZOLE SOD DR 40 MG T: 40 | 30 days supply | Qty: 30 | Fill #1

## 2019-07-25 NOTE — Telephone Encounter (Signed)
Could you please route this to Dr. Berline Lopes? I'm not her PCP.

## 2019-07-26 ENCOUNTER — Encounter: Payer: Self-pay | Admitting: Internal Medicine

## 2019-07-26 MED ORDER — ALBUTEROL SULFATE HFA 108 (90 BASE) MCG/ACT IN AERS: 2.0000 | INHALATION_SPRAY | Freq: Four times a day (QID) | RESPIRATORY_TRACT | 1 refills | Status: DC | PRN

## 2019-07-26 MED FILL — ALBUTEROL SULFATE HFA 108 (: 108 (90 BAS | 25 days supply | Qty: 9 | Fill #0

## 2019-07-26 NOTE — Assessment & Plan Note (Signed)
Patient was diagnosed with COVID-19 11 days ago.  She initially presented with cough, fever, abdominal pain, nausea, and headaches.  Her GI symptoms have resolved but the cough is not getting better.  It continues to be a dry cough.  She has been using NyQuil with no relief.  She gets short of breath during the coughing spells but otherwise is not experiencing dyspnea.  Tylenol is helping with the headaches. We will continue supportive care now. Ordered albuterol inhaler q6h PRN and recommended Robitussin for the cough. Dicussed course of disease with patient as well as return precautions. Hopefully she will start improving within the next few days as she is 2 weeks into this viral illness.

## 2019-07-26 NOTE — Progress Notes (Signed)
  [facility] Internal Medicine Residency Telephone Encounter Continuity Care Appointment  HPI:   This telephone encounter was created for [name] on 07/26/2019 for the following purpose/cc cough.   Past Medical History:  Past Medical History:  Diagnosis Date  . Acid reflux disease   . Anginal pain ([place])    last time 04/18/13 in afternoon; referred to [name]  . Anxiety   . Cancer [facility])    uterine- had hysterectomy  . Chest pain 04/07/2011  . Chronic pain   . Chronic pain 06/19/2014  . Cough 09/25/2011  . Depression   . Fibromyalgia   . Headache(784.0) 12/27/2012  . History of blood transfusion 1981   childbirth  . History of kidney stones    passed  . Hypertension   . Influenza A 07/08/2017  . Left wrist pain 03/10/2013  . Migraine    last one was 04/18/13  . Sleep apnea   . Trichomonas contact, treated       ROS:      Assessment / Plan / Recommendations:   Please see A&P under problem oriented charting for assessment of the patient's acute and chronic medical conditions.   As always, pt is advised that if symptoms worsen or new symptoms arise, they should go to an urgent care facility or to to ER for further evaluation.   Consent and Medical Decision Making:   Patient discussed with Dr. Angelia Mould  This is a telephone encounter between Sherry [name] and Taronda Comacho Santos-Sanchez on 07/26/2019 for cough. The visit was conducted with the patient located at home and Welford Roche at [facility]. The patient's identity was confirmed using their DOB and current address. The patient has consented to being evaluated through a telephone encounter and understands the associated risks (an examination cannot be done and the patient may need to come in for an appointment) / benefits (allows the patient to remain at home, decreasing exposure to coronavirus). I personally spent 7 minutes on medical discussion.

## 2019-07-27 MED FILL — CYCLOBENZAPRINE HCL 10 MG T: 10 | 15 days supply | Qty: 30 | Fill #0

## 2019-07-28 ENCOUNTER — Other Ambulatory Visit: Payer: Self-pay | Admitting: Internal Medicine

## 2019-07-31 NOTE — Progress Notes (Signed)
Internal Medicine Clinic Attending  Case discussed with Dr. Santos-Sanchez at the time of the visit.  We reviewed the resident's history and exam and pertinent patient test results.  I agree with the assessment, diagnosis, and plan of care documented in the resident's note.    

## 2019-08-01 ENCOUNTER — Ambulatory Visit: Payer: 59 | Attending: Internal Medicine

## 2019-08-01 DIAGNOSIS — Z20822 Contact with and (suspected) exposure to covid-19: Secondary | ICD-10-CM | POA: Insufficient documentation

## 2019-08-02 LAB — NOVEL CORONAVIRUS, NAA: SARS-CoV-2, NAA: NOT DETECTED

## 2019-08-07 MED FILL — LISINOPRIL-HCTZ 20-25 MG TA: 20-25 | 30 days supply | Qty: 30 | Fill #1

## 2019-08-07 MED FILL — SIMVASTATIN 40 MG TABLET: 40 | 30 days supply | Qty: 30 | Fill #1

## 2019-08-31 ENCOUNTER — Other Ambulatory Visit: Payer: Self-pay | Admitting: Internal Medicine

## 2019-08-31 DIAGNOSIS — R110 Nausea: Secondary | ICD-10-CM

## 2019-08-31 MED ORDER — PROMETHAZINE HCL 12.5 MG PO TABS: ORAL_TABLET | ORAL | 0 refills | Status: DC

## 2019-08-31 MED FILL — PROMETHAZINE 12.5 MG TABLET: 12.5 | 30 days supply | Qty: 30 | Fill #0

## 2019-08-31 MED FILL — LISINOPRIL-HCTZ 20-25 MG TA: 20-25 | 30 days supply | Qty: 30 | Fill #1

## 2019-08-31 MED FILL — CYCLOBENZAPRINE HCL 10 MG T: 10 | 15 days supply | Qty: 30 | Fill #1

## 2019-08-31 MED FILL — ALBUTEROL SULFATE HFA 108 (: 108 (90 BAS | 25 days supply | Qty: 9 | Fill #1

## 2019-08-31 MED FILL — PANTOPRAZOLE SOD DR 40 MG T: 40 | 30 days supply | Qty: 30 | Fill #2

## 2019-08-31 NOTE — Addendum Note (Signed)
Addended by: Nicola Girt on: 08/31/2019 11:46 AM   Modules accepted: Orders

## 2019-10-03 ENCOUNTER — Other Ambulatory Visit: Payer: Self-pay | Admitting: Internal Medicine

## 2019-10-03 DIAGNOSIS — M797 Fibromyalgia: Secondary | ICD-10-CM

## 2019-10-03 MED FILL — LISINOPRIL-HCTZ 20-25 MG TA: 20-25 | 30 days supply | Qty: 30 | Fill #2

## 2019-10-03 MED FILL — PANTOPRAZOLE SOD DR 40 MG T: 40 | 30 days supply | Qty: 30 | Fill #3

## 2019-10-03 MED FILL — DICLOFENAC SODIUM 1 % GEL: 1 | 13 days supply | Qty: 100 | Fill #1

## 2019-10-04 MED FILL — CYCLOBENZAPRINE HCL 10 MG T: 10 | 15 days supply | Qty: 30 | Fill #0

## 2019-10-04 MED FILL — PROMETHAZINE 12.5 MG TABLET: 12.5 | 30 days supply | Qty: 30 | Fill #0

## 2019-10-09 ENCOUNTER — Encounter: Payer: Self-pay | Admitting: *Deleted

## 2019-10-13 ENCOUNTER — Ambulatory Visit (INDEPENDENT_AMBULATORY_CARE_PROVIDER_SITE_OTHER): Payer: 59 | Admitting: Obstetrics

## 2019-10-13 ENCOUNTER — Encounter: Payer: Self-pay | Admitting: Obstetrics

## 2019-10-13 ENCOUNTER — Other Ambulatory Visit: Payer: Self-pay

## 2019-10-13 ENCOUNTER — Other Ambulatory Visit (HOSPITAL_COMMUNITY)
Admission: RE | Admit: 2019-10-13 | Discharge: 2019-10-13 | Disposition: A | Discharge status: Home / Self Care | Payer: 59 | Source: Ambulatory Visit | Attending: Obstetrics | Admitting: Obstetrics

## 2019-10-13 VITALS — BP 134/96 | HR 102 | Ht 60.0 in | Wt 175.0 lb

## 2019-10-13 DIAGNOSIS — N898 Other specified noninflammatory disorders of vagina: Secondary | ICD-10-CM | POA: Insufficient documentation

## 2019-10-13 DIAGNOSIS — Z1239 Encounter for other screening for malignant neoplasm of breast: Secondary | ICD-10-CM

## 2019-10-13 DIAGNOSIS — Z01419 Encounter for gynecological examination (general) (routine) without abnormal findings: Secondary | ICD-10-CM

## 2019-10-13 DIAGNOSIS — N760 Acute vaginitis: Secondary | ICD-10-CM | POA: Diagnosis not present

## 2019-10-13 DIAGNOSIS — Z1211 Encounter for screening for malignant neoplasm of colon: Secondary | ICD-10-CM

## 2019-10-13 DIAGNOSIS — N9412 Deep dyspareunia: Secondary | ICD-10-CM | POA: Diagnosis not present

## 2019-10-13 DIAGNOSIS — B9689 Other specified bacterial agents as the cause of diseases classified elsewhere: Secondary | ICD-10-CM

## 2019-10-13 DIAGNOSIS — Z01411 Encounter for gynecological examination (general) (routine) with abnormal findings: Secondary | ICD-10-CM

## 2019-10-13 DIAGNOSIS — Z113 Encounter for screening for infections with a predominantly sexual mode of transmission: Secondary | ICD-10-CM | POA: Diagnosis not present

## 2019-10-13 DIAGNOSIS — E669 Obesity, unspecified: Secondary | ICD-10-CM

## 2019-10-13 MED ORDER — TINIDAZOLE 500 MG PO TABS: 1000.0000 mg | ORAL_TABLET | Freq: Every day | ORAL | 2 refills | Status: DC

## 2019-10-13 MED FILL — TINIDAZOLE 500 MG TABS: 500 | 5 days supply | Qty: 10 | Fill #0

## 2019-10-13 NOTE — Progress Notes (Signed)
Subjective:        Sherry [name] is a 59 y.o. female here for a routine exam.  Current complaints: Malodorous discharge sometimes after intercourse.  Denies vaginal irritation, but sometimes has pain with intercourse.    Personal health questionnaire:  Is patient Ashkenazi Jewish, have a family history of breast and/or ovarian cancer: no Is there a family history of uterine cancer diagnosed at age < 62, gastrointestinal cancer, urinary tract cancer, family member who is a Field seismologist syndrome-associated carrier: no Is the patient overweight and hypertensive, family history of diabetes, personal history of gestational diabetes, preeclampsia or PCOS: no Is patient over 44, have PCOS,  family history of premature CHD under age 51, diabetes, smoke, have hypertension or peripheral artery disease:  no At any time, has a partner hit, kicked or otherwise hurt or frightened you?: no Over the past 2 weeks, have you felt down, depressed or hopeless?: no Over the past 2 weeks, have you felt little interest or pleasure in doing things?:no   Gynecologic History No LMP recorded. Patient has had a hysterectomy. Contraception: status post hysterectomy Last Pap: ~ 30 years ago. Results were: normal Last mammogram: 10-22-2014. Results were: normal  Obstetric History OB History  Gravida Para Term Preterm AB Living  3 2 2   1 2   SAB TAB Ectopic Multiple Live Births  1       2    # Outcome Date GA Lbr Len/2nd Weight Sex Delivery Anes PTL Lv  3 SAB           2 Term           1 Term             Past Medical History:  Diagnosis Date  . Acid reflux disease   . Anginal pain ([place])    last time 04/18/13 in afternoon; referred to [name]  . Anxiety   . Cancer [facility])    uterine- had hysterectomy  . Chest pain 04/07/2011  . Chronic pain   . Chronic pain 06/19/2014  . Cough 09/25/2011  . Depression   . Fibromyalgia   . Headache(784.0) 12/27/2012  . History of blood transfusion 1981    childbirth  . History of kidney stones    passed  . Hypertension   . Influenza A 07/08/2017  . Left wrist pain 03/10/2013  . Migraine    last one was 04/18/13  . Sleep apnea   . Trichomonas contact, treated     Past Surgical History:  Procedure Laterality Date  . CHOLECYSTECTOMY     2008  . ORIF CALCANEOUS FRACTURE Left 05/16/2013   Procedure: OPEN REDUCTION INTERNAL FIXATION (ORIF) LEFT CALCANEOUS FRACTURE;  Surgeon: Rozanna Box, MD;  Location: [place];  Service: Orthopedics;  Laterality: Left;  . TOTAL ABDOMINAL HYSTERECTOMY     1986  . WISDOM TOOTH EXTRACTION       Current Outpatient Medications:  .  acetaminophen (TYLENOL 8 HOUR) 650 MG CR tablet, Take 1 tablet (650 mg total) by mouth every 8 (eight) hours as needed., Disp: 30 tablet, Rfl: 0 .  aspirin-acetaminophen-caffeine (EXCEDRIN MIGRAINE) 250-250-65 MG tablet, Take 2 tablets by mouth every 6 (six) hours as needed for headache., Disp: , Rfl:  .  Aspirin-Salicylamide-Caffeine (BC HEADACHE POWDER PO), Take 1 Package by mouth as needed (headache)., Disp: , Rfl:  .  cyclobenzaprine (FLEXERIL) 10 MG tablet, TAKE 1 TABLET BY MOUTH TWO TIMES DAILY AS NEEDED FOR MUSCLE SPASMS, Disp: 30  tablet, Rfl: 1 .  diclofenac Sodium (VOLTAREN) 1 % GEL, APPLY 2 GRAMS TOPICALLY FOUR TIMES DAILY, Disp: 100 g, Rfl: 1 .  ibuprofen (ADVIL,MOTRIN) 400 MG tablet, Take 1 tablet (400 mg total) by mouth every 6 (six) hours as needed., Disp: 30 tablet, Rfl: 0 .  lisinopril-hydrochlorothiazide (ZESTORETIC) 20-25 MG tablet, TAKE 1 TABLET BY MOUTH DAILY., Disp: 90 tablet, Rfl: 1 .  ondansetron (ZOFRAN ODT) 4 MG disintegrating tablet, Take 1 tablet (4 mg total) by mouth every 8 (eight) hours as needed for nausea or vomiting., Disp: 20 tablet, Rfl: 0 .  pantoprazole (PROTONIX) 40 MG tablet, TAKE 1 TABLET (40 MG TOTAL) BY MOUTH DAILY., Disp: 90 tablet, Rfl: 1 .  promethazine (PHENERGAN) 12.5 MG tablet, TAKE 1 TABLET BY MOUTH DAILY AS NEEDED FOR NAUSEA OR  VOMITING., Disp: 30 tablet, Rfl: 0 .  simvastatin (ZOCOR) 40 MG tablet, TAKE 1 TABLET (40 MG TOTAL) BY MOUTH AT BEDTIME., Disp: 90 tablet, Rfl: 1 .  albuterol (PROVENTIL HFA) 108 (90 Base) MCG/ACT inhaler, Inhale 2 puffs into the lungs every 6 (six) hours as needed for wheezing or shortness of breath. (Patient not taking: Reported on 10/13/2019), Disp: 8 g, Rfl: 1 .  mupirocin ointment (BACTROBAN) 2 %, Place 1 application into the nose 2 (two) times daily as needed (leg pain)., Disp: , Rfl:  .  Phenyleph-Doxylamine-DM-APAP (NYQUIL SEVERE COLD/FLU) 5-6.25-10-325 MG/15ML LIQD, Take 30 mLs by mouth at bedtime as needed (sleep/congestion)., Disp: , Rfl:  .  tinidazole (TINDAMAX) 500 MG tablet, Take 2 tablets (1,000 mg total) by mouth daily with breakfast., Disp: 10 tablet, Rfl: 2 Allergies  Allergen Reactions  . Cymbalta [Duloxetine Hcl]     Nausea, vomiting  . Gabapentin     Weight gain    Social History   Tobacco Use  . Smoking status: Never Smoker  . Smokeless tobacco: Never Used  Substance Use Topics  . Alcohol use: No    Family History  Problem Relation Age of Onset  . Diabetes Mother   . Hypertension Mother   . Depression Mother   . Diabetes Sister   . Hypertension Father   . Cancer Father        Prostate  . Alcohol abuse Father   . Cancer Brother        Prostate  . Breast cancer Neg Hx       Review of Systems  Constitutional: negative for fatigue and weight loss Respiratory: negative for cough and wheezing Cardiovascular: negative for chest pain, fatigue and palpitations Gastrointestinal: negative for abdominal pain and change in bowel habits Musculoskeletal:negative for myalgias Neurological: negative for gait problems and tremors Behavioral/Psych: negative for abusive relationship, depression Endocrine: negative for temperature intolerance    Genitourinary:negative for abnormal menstrual periods, genital lesions, hot flashes. Positive for malodorous vaginal discharge  and painful intercourse Integument/breast: negative for breast lump, breast tenderness, nipple discharge and skin lesion(s)    Objective:       BP (!) 134/96   Pulse (!) 102   Ht 5' (1.524 m)   Wt 175 lb (79.4 kg)   BMI 34.18 kg/m  General:   alert  Skin:   no rash or abnormalities  Lungs:   clear to auscultation bilaterally  Heart:   regular rate and rhythm, S1, S2 normal, no murmur, click, rub or gallop  Breasts:   normal without suspicious masses, skin or nipple changes or axillary nodes  Abdomen:  normal findings: no organomegaly, soft, non-tender and no hernia  Pelvis:  External genitalia: normal general appearance Urinary system: urethral meatus normal and bladder without fullness, nontender Vaginal: normal without tenderness, induration or masses Cervix: normal appearance Adnexa: normal bimanual exam Uterus: anteverted and non-tender, normal size   Lab Review Urine pregnancy test Labs reviewed yes Radiologic studies reviewed no  50% of 20 min visit spent on counseling and coordination of care.   Assessment:     1. Encounter for annual routine gynecological examination  2. Deep dyspareunia - vaginal moisturizers and lubricants recommended - increased foreplay recommended for self lubrication  3. Vaginal discharge Rx: - Cervicovaginal ancillary only( [place])  4. Screen for STD (sexually transmitted disease) Rx: - HIV Antibody (routine testing w rflx) - Hepatitis B surface antigen - RPR - Hepatitis C antibody  5. Screening breast examination Rx: - MM Digital Screening; Future  6. Screening for colon cancer Rx: - Ambulatory referral to Gastroenterology  7. Obesity (BMI 30.0-34.9) - program of caloric reduction, exercise and behavioral modification recommended    Plan:    Education reviewed: calcium supplements, depression evaluation, low fat, low cholesterol diet, safe sex/STD prevention, self breast exams and weight bearing  exercise. Mammogram ordered. GI referral made for colon CA screen    Orders Placed This Encounter  Procedures  . MM Digital Screening    Standing Status:   Future    Standing Expiration Date:   12/12/2020    Order Specific Question:   Reason for Exam (SYMPTOM  OR DIAGNOSIS REQUIRED)    Answer:   Screening    Order Specific Question:   Is the patient pregnant?    Answer:   No    Order Specific Question:   Preferred imaging location?    Answer:   [facility]  . HIV Antibody (routine testing w rflx)  . Hepatitis B surface antigen  . RPR  . Hepatitis C antibody  . Ambulatory referral to Gastroenterology    Referral Priority:   Routine    Referral Type:   Consultation    Referral Reason:   Specialty Services Required    Number of Visits Requested:   1    Shelly Bombard, MD 10/13/2019 10:27 AM

## 2019-10-14 LAB — HEPATITIS C ANTIBODY: Hep C Virus Ab: <0.1 s/co ratio (ref 0.0–0.9)

## 2019-10-14 LAB — HEPATITIS B SURFACE ANTIGEN: Hepatitis B Surface Ag: NEGATIVE

## 2019-10-14 LAB — RPR: RPR Ser Ql: NONREACTIVE

## 2019-10-14 LAB — HIV ANTIBODY (ROUTINE TESTING W REFLEX): HIV Screen 4th Generation wRfx: NONREACTIVE

## 2019-10-16 LAB — CERVICOVAGINAL ANCILLARY ONLY
Bacterial Vaginitis (gardnerella): POSITIVE — AB
Candida Glabrata: NEGATIVE
Candida Vaginitis: NEGATIVE
Chlamydia: NEGATIVE
Comment: NEGATIVE
Comment: NEGATIVE
Comment: NEGATIVE
Comment: NEGATIVE
Comment: NEGATIVE
Comment: NORMAL
Neisseria Gonorrhea: NEGATIVE
Trichomonas: NEGATIVE

## 2019-10-17 ENCOUNTER — Other Ambulatory Visit: Payer: Self-pay | Admitting: Obstetrics

## 2019-11-01 ENCOUNTER — Other Ambulatory Visit: Payer: Self-pay | Admitting: Internal Medicine

## 2019-11-01 MED FILL — CYCLOBENZAPRINE HCL 10 MG T: 10 | 15 days supply | Qty: 30 | Fill #1

## 2019-11-01 MED FILL — PANTOPRAZOLE SOD DR 40 MG T: 40 | 30 days supply | Qty: 30 | Fill #4

## 2019-11-01 MED FILL — LISINOPRIL-HCTZ 20-25 MG TA: 20-25 | 30 days supply | Qty: 30 | Fill #3

## 2019-11-02 MED FILL — PROMETHAZINE 12.5 MG TABLET: 12.5 | 30 days supply | Qty: 30 | Fill #0

## 2019-11-02 MED FILL — DICLOFENAC SODIUM 1 % GEL: 1 | 13 days supply | Qty: 100 | Fill #0

## 2019-11-13 ENCOUNTER — Encounter: Payer: Self-pay | Admitting: Gastroenterology

## 2019-11-20 ENCOUNTER — Ambulatory Visit (INDEPENDENT_AMBULATORY_CARE_PROVIDER_SITE_OTHER): Payer: 59 | Admitting: Internal Medicine

## 2019-11-20 ENCOUNTER — Encounter: Payer: Self-pay | Admitting: Internal Medicine

## 2019-11-20 VITALS — BP 154/96 | HR 88 | Temp 99.1°F | Ht 60.0 in | Wt 181.9 lb

## 2019-11-20 DIAGNOSIS — M797 Fibromyalgia: Secondary | ICD-10-CM | POA: Diagnosis not present

## 2019-11-20 DIAGNOSIS — I10 Essential (primary) hypertension: Secondary | ICD-10-CM | POA: Diagnosis not present

## 2019-11-20 NOTE — Patient Instructions (Signed)
Thank you for allowing [place] to provide your care today. Today we discussed your leg pain and fibromyalgia.  I refer you to pain clinic per your request.  Someone from their office will call you for an appointment. Please follow up in their officie.  Your blood pressure today is elevated likely because you did not take your medications today. Please take your medicines when you go home.  We need some blood work. We will do it on next visit per your wish.  Today we did not make any changes to your medications. Please take them as before.  Please follow-up in 1-2 months for BP check and blood work.    Should you have any questions or concerns please call the internal medicine clinic at [phone].

## 2019-11-20 NOTE — Progress Notes (Signed)
   CC: Leg pain, f/u for fibromyalgia  HPI:  [name] is a 59 y.o. female with past medical history of HTN, GERD, obesity, HLD, depression, fibromyalgia, history of COVID-19 infection, presented to clinic for leg pain. Please refer to problem based charting for further details and assessment and plan of current problem and chronic medical conditions.   Past Medical History:  Diagnosis Date  . Acid reflux disease   . Anginal pain ([place])    last time 04/18/13 in afternoon; referred to [name]  . Anxiety   . Cancer [facility])    uterine- had hysterectomy  . Chest pain 04/07/2011  . Chronic pain   . Chronic pain 06/19/2014  . Cough 09/25/2011  . Depression   . Fibromyalgia   . Headache(784.0) 12/27/2012  . History of blood transfusion 1981   childbirth  . History of kidney stones    passed  . Hypertension   . Influenza A 07/08/2017  . Left wrist pain 03/10/2013  . Migraine    last one was 04/18/13  . Sleep apnea   . Trichomonas contact, treated    Review of Systems:   Constitutional: Negative for chills and fever.  Respiratory: Negative for shortness of breath.   Cardiovascular: Negative for chest pain and leg swelling.  Gastrointestinal: Negative for abdominal pain, nausea and vomiting.  Neurological: Negative for dizziness and headaches.  MSK: legs and back pain  Physical Exam:  Vitals:   11/20/19 1435  BP: (!) 154/96  Pulse: (!) 101  Temp: 99.1 F (37.3 C)  TempSrc: Oral  SpO2: 100%  Weight: 181 lb 14.4 oz (82.5 kg)  Height: 5' (1.524 m)    Constitutional: Well-developed and well-nourished. No acute distress.  HENT:  Head: Normocephalic and atraumatic.  Eyes: Conjunctivae are normal, EOM nl Cardiovascular:  RRR, nl S1S2, no murmur,  no LEE Respiratory: Effort normal and breath sounds normal. No respiratory distress. No wheezes.  GI: Soft. Bowel sounds are normal. No distension. There is no tenderness.  Neurological: Is alert and oriented x 3    Skin: Not diaphoretic. No erythema.  Psychiatric:  Slightly blunt affect. Behavior is normal. Judgment and thought content normal.  MSK: bilateral tenderness of both legs, without swelling, erythema. Bilateral tenderness of paraspinal muscle of upper and lower back  Assessment & Plan:   See Encounters Tab for problem based charting.  Patient discussed with Dr. Heber [place]

## 2019-11-21 ENCOUNTER — Encounter: Payer: Self-pay | Admitting: Internal Medicine

## 2019-11-21 NOTE — Assessment & Plan Note (Signed)
Patient presented today with continious legs and upper and lower back pain. She mentions that she used to be on some medications for her fibromyalgia but not recently. She is here today because she thinks "it is time to be referred to pain clinic again".  On exam, no clear point tenderness on her legs but she reports pain all over her legs.  I offered starting medication such as lyrica or amitriptylin, but she is not intrested to try those and would like to be referred to pain clinic.  -Ambulatory referral to pain clinic

## 2019-11-21 NOTE — Assessment & Plan Note (Signed)
He is currently on lisinopril-HCTZ 20-25 mg daily. Blood pressure today is 154/96, heart rate of 101. She currently has some pain. Mentions that her BP is better at home except the time that it goes up due to pain.  -Continue current regimen and follow up in clinic in 1-2 month for BP check

## 2019-11-22 NOTE — Progress Notes (Signed)
Internal Medicine Clinic Attending  Case discussed with Dr. Myrtie Hawk at the time of the visit.  We reviewed the residents history and exam and pertinent patient test results.  I agree with the assessment, diagnosis, and plan of care documented in the residents note.    On brief review of her chart, it appears she was previously diagnosed with fibromyalgia and offered multiple treatment options when she was a patient of the family practice center.  She also refused typical 1st line fibromyalgia medications and preferred to see pain management,  At this point I am not sure if she truly has fibromyalgia or not.  I do not think opioids are indicated here, since she has previously seen pain management and wants to go back that is certainly reasonable to refer.

## 2019-11-27 ENCOUNTER — Encounter: Payer: Self-pay | Admitting: Physical Medicine and Rehabilitation

## 2019-11-27 ENCOUNTER — Other Ambulatory Visit: Payer: Self-pay | Admitting: Internal Medicine

## 2019-11-27 DIAGNOSIS — M797 Fibromyalgia: Secondary | ICD-10-CM

## 2019-11-27 MED FILL — LISINOPRIL-HCTZ 20-25 MG TA: 20-25 | 30 days supply | Qty: 30 | Fill #4

## 2019-11-27 MED FILL — PANTOPRAZOLE SOD DR 40 MG T: 40 | 30 days supply | Qty: 30 | Fill #5

## 2019-11-27 MED FILL — PROMETHAZINE 12.5 MG TABLET: 12.5 | 30 days supply | Qty: 30 | Fill #0

## 2019-11-27 MED FILL — CYCLOBENZAPRINE HCL 10 MG T: 10 | 15 days supply | Qty: 30 | Fill #0

## 2019-12-08 ENCOUNTER — Other Ambulatory Visit: Payer: Self-pay

## 2019-12-08 ENCOUNTER — Encounter: Payer: 59 | Attending: Physical Medicine and Rehabilitation | Admitting: Physical Medicine and Rehabilitation

## 2019-12-08 ENCOUNTER — Encounter: Payer: Self-pay | Admitting: Physical Medicine and Rehabilitation

## 2019-12-08 VITALS — BP 115/82 | HR 102 | Temp 99.1°F | Ht 60.0 in | Wt 176.0 lb

## 2019-12-08 DIAGNOSIS — M797 Fibromyalgia: Secondary | ICD-10-CM | POA: Diagnosis present

## 2019-12-08 DIAGNOSIS — R531 Weakness: Secondary | ICD-10-CM | POA: Insufficient documentation

## 2019-12-08 MED ORDER — MELOXICAM 15 MG PO TABS: 15.0000 mg | ORAL_TABLET | Freq: Every day | ORAL | 0 refills | Status: DC

## 2019-12-08 MED FILL — MELOXICAM 15 MG TABLET: 15 | 14 days supply | Qty: 14 | Fill #0

## 2019-12-08 NOTE — Progress Notes (Signed)
Subjective:    Patient ID: Sherry [name], female    DOB: 1961-02-03, 59 y.o.   MRN: [id number]  HPI  Sherry [name] is a 59 year old female who presents to establish care for her fibromyalgia.   She has had fibromyalgia for may years. She used to follow with Dr. Naaman Plummer. She uses a heating pad which does provide relief. Pain medications take the edge off the pain. She is unable to tolerate family retreats in [place] because of the cold.   She takes Tylenol 325mg  up to 10 times per day. She takes Ipuprofen if she does not have any more Tylenol. She takes a total of 800mg  per day if neeed.   She wants to exercise to lose some weights. Her grandkids are 21 to 4- she has 33 grandchildren and 4 great grandkids.   She wants to date. She wants to be young again.   She did not tolerate Lyrica or Cymbalta well. Gabapentin made her gain weight. She cannot recall her response to Amitriptyline.   Average pain is 10/10, pain right now is 7/10 and feels aching on both sides of her body in multiple joints and muscles.   Pain Inventory Average Pain 10 Pain Right Now 7 My pain is aching  In the last 24 hours, has pain interfered with the following? General activity varies Relation with others 6 Enjoyment of life varies What TIME of day is your pain at its worst? random Sleep (in general) Poor  Pain is worse with: walking, bending, sitting and standing Pain improves with: rest and heat/ice Relief from Meds: 7  Mobility walk without assistance ability to climb steps?  yes do you drive?  yes Do you have any goals in this area?  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  Neuro/Psych spasms  Prior Studies new  Physicians involved in your care new   Family History  Problem Relation Age of Onset   Diabetes Mother    Hypertension Mother    Depression Mother    Diabetes Sister    Hypertension Father    Cancer Father        Prostate   Alcohol abuse Father     Cancer Brother        Prostate   Breast cancer Neg Hx    Social History   Socioeconomic History   Marital status: Single    Spouse name: Not on file   Number of children: 2   Years of education: Not on file   Highest education level: 12th grade  Occupational History   Occupation: Unemployed  Tobacco Use   Smoking status: Never Smoker   Smokeless tobacco: Never Used  Substance and Sexual Activity   Alcohol use: No   Drug use: No   Sexual activity: Yes    Birth control/protection: None    Comment: last intercouse two months ago  Other Topics Concern   Not on file  Social History Narrative   Lives with   2 Children: Ages 40 and 75   4 Grandchildren: 2, 3, 4 and 12   Brother      Diet: "Barely eats". Does not exercise due to pain.      03/2011- Currently unemployed. 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 Unsure of last pap.   Unsure of last Td   Unsure of last flu shot   Social  Determinants of Health   Financial Resource Strain:    Difficulty of Paying Living Expenses:   Food Insecurity:    Worried About Charity fundraiser in the Last Year:    Arboriculturist in the Last Year:   Transportation Needs: Public librarian (Medical): Yes   Lack of Transportation (Non-Medical): Yes  Physical Activity:    Days of Exercise per Week:    Minutes of Exercise per Session:   Stress:    Feeling of Stress :   Social Connections:    Frequency of Communication with Friends and Family:    Frequency of Social Gatherings with Friends and Family:    Attends Religious Services:    Active Member of Clubs or Organizations:    Attends Archivist Meetings:    Marital Status:    Past Surgical History:  Procedure Laterality Date   CHOLECYSTECTOMY     2008   ORIF CALCANEOUS FRACTURE Left 05/16/2013   Procedure: OPEN REDUCTION INTERNAL FIXATION (ORIF)  LEFT CALCANEOUS FRACTURE;  Surgeon: Rozanna Box, MD;  Location: [place];  Service: Orthopedics;  Laterality: Left;   TOTAL ABDOMINAL HYSTERECTOMY     1986   WISDOM TOOTH EXTRACTION     Past Medical History:  Diagnosis Date   Acid reflux disease    Anginal pain ([place])    last time 04/18/13 in afternoon; referred to [name]   Anxiety    Cancer [facility])    uterine- had hysterectomy   Chest pain 04/07/2011   Chronic pain    Chronic pain 06/19/2014   Cough 09/25/2011   Depression    Fibromyalgia    Headache(784.0) 12/27/2012   History of blood transfusion 1981   childbirth   History of kidney stones    passed   Hypertension    Influenza A 07/08/2017   Left wrist pain 03/10/2013   Migraine    last one was 04/18/13   Sleep apnea    Trichomonas contact, treated    BP 115/82    Pulse (!) 102    Temp 99.1 F (37.3 C) (Oral)    Ht 5' (1.524 m)    Wt 176 lb (79.8 kg)    SpO2 92%    BMI 34.37 kg/m   Opioid Risk Score:   Fall Risk Score:  `1  Depression screen PHQ 2/9  Depression screen [facility] 2/9 12/08/2019 11/20/2019 07/18/2019 08/03/2018 06/20/2018 05/09/2018 06/19/2014  Decreased Interest 0 1 0 3 0 1 0  Down, Depressed, Hopeless 0 0 1 1 1 1  0  PHQ - 2 Score 0 1 1 4 1 2  0  Altered sleeping 0 1 - 3 3 3  -  Tired, decreased energy 0 1 - 3 1 3  -  Change in appetite 0 1 - 1 1 2  -  Feeling bad or failure about yourself  0 0 - 1 0 3 -  Trouble concentrating 0 0 - 0 0 0 -  Moving slowly or fidgety/restless 0 0 - 0 1 0 -  Suicidal thoughts 0 0 - 0 0 0 -  PHQ-9 Score 0 4 - 12 7 13  -  Difficult doing work/chores - Not difficult at all - Not difficult at all Somewhat difficult Extremely dIfficult -    Review of Systems  Constitutional: Negative.   HENT: Negative.   Eyes: Negative.   Respiratory: Negative.   Cardiovascular: Negative.   Gastrointestinal: Positive for constipation and nausea.  Endocrine:  Negative.   Musculoskeletal: Positive for arthralgias, back pain,  myalgias and neck pain.       Spasms   Psychiatric/Behavioral: The patient is nervous/anxious.   All other systems reviewed and are negative.      Objective:   Physical Exam Gen: no distress, normal appearing HEENT: oral mucosa pink and moist, NCAT Cardio: Reg rate Chest: normal effort, normal rate of breathing Abd: soft, non-distended Ext: no edema Skin: intact Neuro: Alert and oriented x3.  Musculoskeletal: Diffuse tenderness in multiple joints on both sides of the body.  Psych: pleasant, normal affect    Assessment & Plan:  Sherry [name] is a 59 year old woman who presents to establish care for fibromyalgia with worsening symptoms. She also has weakness and fatigue.   Fibromyalgia is a clinical syndrome characterized by widespread pain and tenderness in addition to a variety of symptoms, including fatigue, anxiety, depression, and sleep disturbances. Fibromyalgia affects 3-5% of women and up to 25% of women with other rheumatological conditions.   Exercise is a first-line treatment for the disease, and can help with both the physical and emotional symptoms, as well as improving overall health and function.   Obesity (BMI 34.37) -Our goal is to increase her exercise, improve her nutrition.   -Provided referral for aquatic therapy.   -Prescribed Meloxicam 15mg  daily to help decrease inflammation, as well as to reduce reliance on Tylenol.   -She has failed all neuropathic agents. Can consider Sevella in the future.   All questions answered. RTC in 1 month.

## 2019-12-09 ENCOUNTER — Encounter: Payer: Self-pay | Admitting: Physical Medicine and Rehabilitation

## 2019-12-14 ENCOUNTER — Other Ambulatory Visit: Payer: Self-pay

## 2019-12-14 ENCOUNTER — Ambulatory Visit (AMBULATORY_SURGERY_CENTER): Payer: Self-pay | Admitting: *Deleted

## 2019-12-14 VITALS — Ht 60.0 in | Wt 181.0 lb

## 2019-12-14 DIAGNOSIS — Z1211 Encounter for screening for malignant neoplasm of colon: Secondary | ICD-10-CM

## 2019-12-14 MED ORDER — NA SULFATE-K SULFATE-MG SULF 17.5-3.13-1.6 GM/177ML PO SOLN: ORAL | 0 refills | Status: DC

## 2019-12-14 MED FILL — SUPREP BOWEL PREP KIT: 17.5-3.13-1 | 1 days supply | Qty: 354 | Fill #0

## 2019-12-14 NOTE — Progress Notes (Signed)
Patient is here in-person for PV. Patient denies any allergies to eggs or soy. Patient denies any problems with anesthesia/sedation. Patient denies any oxygen use at home. Patient denies taking any diet/weight loss medications or blood thinners. Patient is not being treated for MRSA or C-diff. Patient is aware of our care-partner policy and [id number] safety protocol. EMMI education assisgned to the patient for the procedure, this was explained and instructions given to patient.   covid vaccines completed on 11/20/19 per pt.

## 2020-01-01 ENCOUNTER — Encounter: Payer: No Typology Code available for payment source | Admitting: Gastroenterology

## 2020-01-01 ENCOUNTER — Telehealth: Payer: Self-pay | Admitting: *Deleted

## 2020-01-01 ENCOUNTER — Encounter: Payer: Self-pay | Admitting: Internal Medicine

## 2020-01-01 NOTE — Telephone Encounter (Signed)
Sherry [name]  P Lec Pre-Visit Hello,    Please resend pre-visit letter with new procedure date per patients request.    Thanks    8-20 Colon new instructions to py via MyChart and mail

## 2020-01-03 MED FILL — CYCLOBENZAPRINE HCL 10 MG T: 10 | 15 days supply | Qty: 30 | Fill #1

## 2020-01-03 MED FILL — DICLOFENAC SODIUM 1 % GEL: 1 | 13 days supply | Qty: 100 | Fill #1

## 2020-01-05 ENCOUNTER — Encounter: Payer: 59 | Admitting: Internal Medicine

## 2020-01-09 ENCOUNTER — Other Ambulatory Visit: Payer: Self-pay | Admitting: *Deleted

## 2020-01-09 ENCOUNTER — Other Ambulatory Visit: Payer: Self-pay | Admitting: Student

## 2020-01-09 DIAGNOSIS — K219 Gastro-esophageal reflux disease without esophagitis: Secondary | ICD-10-CM

## 2020-01-09 MED ORDER — PROMETHAZINE HCL 12.5 MG PO TABS: 12.5000 mg | ORAL_TABLET | Freq: Every day | ORAL | 0 refills | Status: DC | PRN

## 2020-01-09 MED ORDER — PANTOPRAZOLE SODIUM 40 MG PO TBEC: 40.0000 mg | DELAYED_RELEASE_TABLET | Freq: Every day | ORAL | 1 refills | Status: DC

## 2020-01-09 MED FILL — PROMETHAZINE 12.5 MG TABLET: 12.5 | 20 days supply | Qty: 20 | Fill #0

## 2020-01-09 MED FILL — PANTOPRAZOLE SOD DR 40 MG T: 40 | 90 days supply | Qty: 90 | Fill #0

## 2020-01-10 ENCOUNTER — Encounter: Payer: 59 | Attending: Physical Medicine and Rehabilitation | Admitting: Physical Medicine and Rehabilitation

## 2020-01-10 DIAGNOSIS — R531 Weakness: Secondary | ICD-10-CM | POA: Insufficient documentation

## 2020-01-10 DIAGNOSIS — M797 Fibromyalgia: Secondary | ICD-10-CM | POA: Insufficient documentation

## 2020-01-26 ENCOUNTER — Encounter: Payer: Self-pay | Admitting: Internal Medicine

## 2020-01-26 ENCOUNTER — Ambulatory Visit (AMBULATORY_SURGERY_CENTER): Payer: 59 | Admitting: Internal Medicine

## 2020-01-26 ENCOUNTER — Other Ambulatory Visit: Payer: Self-pay

## 2020-01-26 VITALS — BP 119/75 | HR 83 | Temp 97.5°F | Resp 16 | Ht 60.0 in | Wt 181.0 lb

## 2020-01-26 DIAGNOSIS — Z1211 Encounter for screening for malignant neoplasm of colon: Secondary | ICD-10-CM | POA: Diagnosis not present

## 2020-01-26 MED ORDER — SODIUM CHLORIDE 0.9 % IV SOLN: 500.0000 mL | INTRAVENOUS | Status: DC

## 2020-01-26 NOTE — Progress Notes (Signed)
To Pacu, VSS. Report to [email or web address] 

## 2020-01-26 NOTE — Op Note (Signed)
[facility] Patient Name: Sherry [name] Procedure Date: 01/26/2020 9:33 AM MRN: [id number] Endoscopist: Docia Chuck. Henrene Pastor , MD Age: 59 Referring MD:  Date of Birth: 03-Sep-1960 Gender: Female Account #: [id number] Procedure:                Colonoscopy Indications:              Screening for colorectal malignant neoplasm Medicines:                Monitored Anesthesia Care Procedure:                Pre-Anesthesia Assessment:                           - Prior to the procedure, a History and Physical                            was performed, and patient medications and                            allergies were reviewed. The patient's tolerance of                            previous anesthesia was also reviewed. The risks                            and benefits of the procedure and the sedation                            options and risks were discussed with the patient.                            All questions were answered, and informed consent                            was obtained. Prior Anticoagulants: The patient has                            taken no previous anticoagulant or antiplatelet                            agents. ASA Grade Assessment: II - A patient with                            mild systemic disease. After reviewing the risks                            and benefits, the patient was deemed in                            satisfactory condition to undergo the procedure.                           After obtaining informed consent, the colonoscope  was passed under direct vision. Throughout the                            procedure, the patient's blood pressure, pulse, and                            oxygen saturations were monitored continuously. The                            Colonoscope was introduced through the anus and                            advanced to the the cecum, identified by                            appendiceal orifice and  ileocecal valve. The                            ileocecal valve, appendiceal orifice, and rectum                            were photographed. The quality of the bowel                            preparation was excellent. The colonoscopy was                            performed without difficulty. The patient tolerated                            the procedure well. The bowel preparation used was                            SUPREP via split dose instruction. Scope In: 9:50:37 AM Scope Out: 10:04:11 AM Scope Withdrawal Time: 0 hours 11 minutes 31 seconds  Total Procedure Duration: 0 hours 13 minutes 34 seconds  Findings:                 The entire examined colon appeared normal on direct                            and retroflexion views, save incidental lipoma in                            the proximal ascending colon. Complications:            No immediate complications. Estimated blood loss:                            None. Estimated Blood Loss:     Estimated blood loss: none. Impression:               -Incidental lipoma in the proximal ascending colon.  The entire examined colon is otherwise normal on                            direct and retroflexion views.                           - No specimens collected. Recommendation:           - Repeat colonoscopy in 10 years for screening                            purposes.                           - Patient has a contact number available for                            emergencies. The signs and symptoms of potential                            delayed complications were discussed with the                            patient. Return to normal activities tomorrow.                            Written discharge instructions were provided to the                            patient.                           - Resume previous diet.                           - Continue present medications. Docia Chuck. Henrene Pastor, MD 01/26/2020  10:08:39 AM This report has been signed electronically.

## 2020-01-26 NOTE — Patient Instructions (Signed)
YOU HAD AN ENDOSCOPIC PROCEDURE TODAY AT THE [facility]:   Refer to the procedure report that was given to you for any specific questions about what was found during the examination.  If the procedure report does not answer your questions, please call your gastroenterologist to clarify.  If you requested that your care partner not be given the details of your procedure findings, then the procedure report has been included in a sealed envelope for you to review at your convenience later.  YOU SHOULD EXPECT: Some feelings of bloating in the abdomen. Passage of more gas than usual.  Walking can help get rid of the air that was put into your GI tract during the procedure and reduce the bloating. If you had a lower endoscopy (such as a colonoscopy or flexible sigmoidoscopy) you may notice spotting of blood in your stool or on the toilet paper. If you underwent a bowel prep for your procedure, you may not have a normal bowel movement for a few days.  Please Note:  You might notice some irritation and congestion in your nose or some drainage.  This is from the oxygen used during your procedure.  There is no need for concern and it should clear up in a day or so.  SYMPTOMS TO REPORT IMMEDIATELY:   Following lower endoscopy (colonoscopy or flexible sigmoidoscopy):  Excessive amounts of blood in the stool  Significant tenderness or worsening of abdominal pains  Swelling of the abdomen that is new, acute  Fever of 100F or higher  For urgent or emergent issues, a gastroenterologist can be reached at any hour by calling [phone]. Do not use MyChart messaging for urgent concerns.    DIET:  We do recommend a small meal at first, but then you may proceed to your regular diet.  Drink plenty of fluids but you should avoid alcoholic beverages for 24 hours.  ACTIVITY:  You should plan to take it easy for the rest of today and you should NOT DRIVE or use heavy machinery until tomorrow (because  of the sedation medicines used during the test).    FOLLOW UP: Our staff will call the number listed on your records 48-72 hours following your procedure to check on you and address any questions or concerns that you may have regarding the information given to you following your procedure. If we do not reach you, we will leave a message.  We will attempt to reach you two times.  During this call, we will ask if you have developed any symptoms of COVID 19. If you develop any symptoms (ie: fever, flu-like symptoms, shortness of breath, cough etc.) before then, please call [phone].  If you test positive for Covid 19 in the 2 weeks post procedure, please call and report this information to us.    If any biopsies were taken you will be contacted by phone or by letter within the next 1-3 weeks.  Please call us at [phone] if you have not heard about the biopsies in 3 weeks.    SIGNATURES/CONFIDENTIALITY: You and/or your care partner have signed paperwork which will be entered into your electronic medical record.  These signatures attest to the fact that that the information above on your After Visit Summary has been reviewed and is understood.  Full responsibility of the confidentiality of this discharge information lies with you and/or your care-partner. 

## 2020-01-26 NOTE — Progress Notes (Signed)
Vitals and temp by CW Completed covid vaccines

## 2020-01-30 ENCOUNTER — Telehealth: Payer: Self-pay | Admitting: *Deleted

## 2020-01-30 NOTE — Telephone Encounter (Signed)
  Follow up Call-  Call back number 01/26/2020  Post procedure Call Back phone  # [phone]  Permission to leave phone message Yes  Some recent data might be hidden     Patient questions:  Do you have a fever, pain , or abdominal swelling? No. Pain Score  0 *  Have you tolerated food without any problems? Yes.    Have you been able to return to your normal activities? Yes.    Do you have any questions about your discharge instructions: Diet   No. Medications  No. Follow up visit  No.  Do you have questions or concerns about your Care? No.  Actions: * If pain score is 4 or above: No action needed, pain <4.  1. Have you developed a fever since your procedure? no  2.   Have you had an respiratory symptoms (SOB or cough) since your procedure? no  3.   Have you tested positive for COVID 19 since your procedure no  4.   Have you had any family members/close contacts diagnosed with the COVID 19 since your procedure?  no   If yes to any of these questions please route to Joylene John, RN and Joella Prince, RN

## 2020-02-06 ENCOUNTER — Other Ambulatory Visit: Payer: Self-pay | Admitting: Internal Medicine

## 2020-02-06 ENCOUNTER — Other Ambulatory Visit: Payer: Self-pay | Admitting: Student

## 2020-02-06 DIAGNOSIS — M797 Fibromyalgia: Secondary | ICD-10-CM

## 2020-02-06 IMAGING — CR CHEST - 2 VIEW
2 series · 2 of 2 positions shown · non-contrast
Comparison: PA and lateral chest 07/18/2018 and 10/10/2015.

CLINICAL DATA: Onset nausea and vomiting this morning.

EXAM:
CHEST - 2 VIEW

[x chest ap]
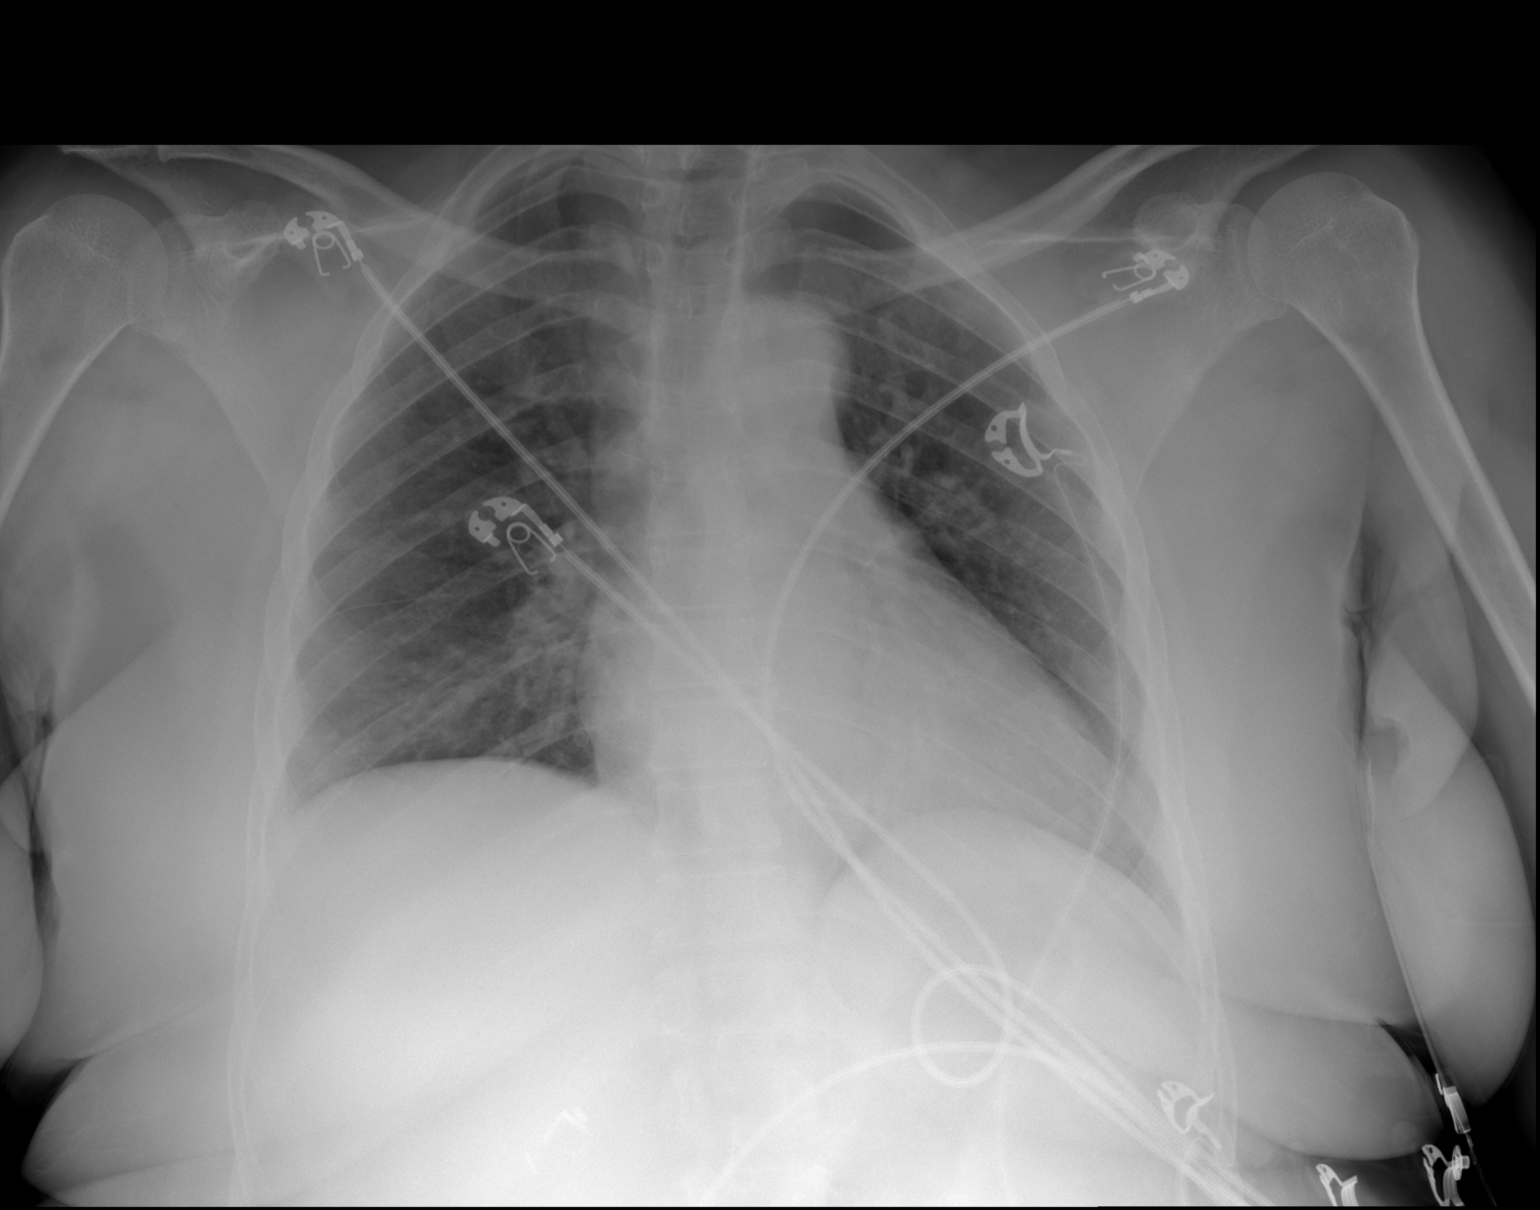

[w chest lat]
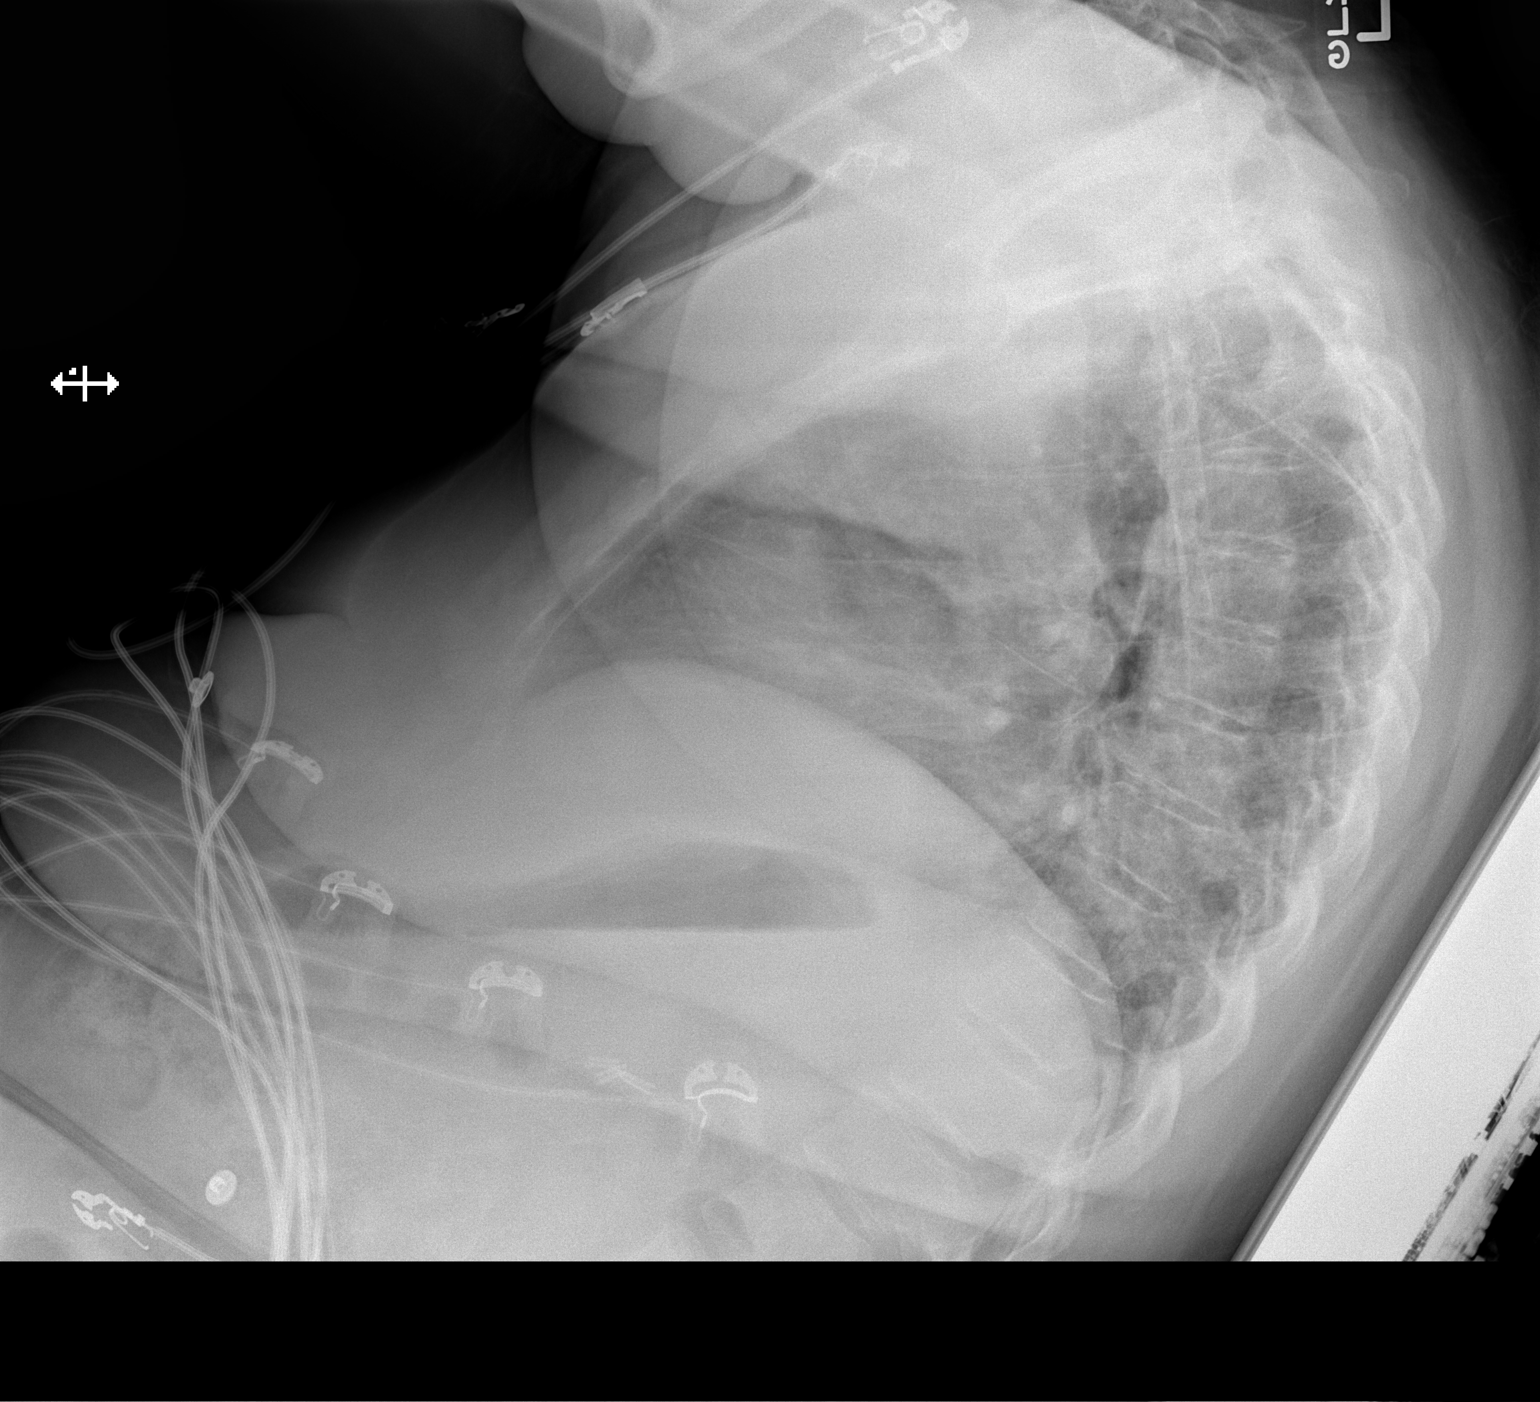

[2 of 2 positions shown; findings below may reference images not displayed]

FINDINGS: The lungs are clear. Heart size is normal. No pneumothorax or
pleural effusion. No acute or focal bony abnormality
IMPRESSION: Normal chest.

## 2020-02-06 MED FILL — LISINOPRIL-HCTZ 20-25 MG TA: 20-25 | 30 days supply | Qty: 30 | Fill #5

## 2020-02-06 MED FILL — DICLOFENAC SODIUM 1 % GEL: 1 | 37 days supply | Qty: 300 | Fill #0

## 2020-02-06 MED FILL — SIMVASTATIN 40 MG TABLET: 40 | 30 days supply | Qty: 30 | Fill #1

## 2020-02-06 MED FILL — PROMETHAZINE 12.5 MG TABLET: 12.5 | 20 days supply | Qty: 20 | Fill #0

## 2020-02-06 MED FILL — CYCLOBENZAPRINE HCL 10 MG T: 10 | 15 days supply | Qty: 30 | Fill #0

## 2020-03-04 ENCOUNTER — Encounter: Payer: 59 | Admitting: Physical Medicine and Rehabilitation

## 2020-03-14 ENCOUNTER — Other Ambulatory Visit: Payer: Self-pay | Admitting: Internal Medicine

## 2020-03-14 MED FILL — PROMETHAZINE 12.5 MG TABLET: 12.5 | 20 days supply | Qty: 20 | Fill #0

## 2020-03-14 MED FILL — CYCLOBENZAPRINE HCL 10 MG T: 10 | 15 days supply | Qty: 30 | Fill #1

## 2020-03-20 ENCOUNTER — Encounter: Payer: 59 | Attending: Physical Medicine and Rehabilitation | Admitting: Physical Medicine and Rehabilitation

## 2020-03-20 ENCOUNTER — Other Ambulatory Visit: Payer: Self-pay

## 2020-03-20 ENCOUNTER — Encounter: Payer: Self-pay | Admitting: Physical Medicine and Rehabilitation

## 2020-03-20 VITALS — BP 118/80 | HR 88 | Temp 98.7°F | Ht 60.0 in | Wt 175.4 lb

## 2020-03-20 DIAGNOSIS — M797 Fibromyalgia: Secondary | ICD-10-CM | POA: Diagnosis not present

## 2020-03-20 DIAGNOSIS — R519 Headache, unspecified: Secondary | ICD-10-CM | POA: Diagnosis not present

## 2020-03-20 DIAGNOSIS — M7918 Myalgia, other site: Secondary | ICD-10-CM | POA: Insufficient documentation

## 2020-03-20 MED ORDER — LIDOCAINE 5 % EX PTCH: 1.0000 | MEDICATED_PATCH | CUTANEOUS | 0 refills | Status: DC

## 2020-03-20 NOTE — Progress Notes (Signed)
Subjective:    Patient ID: Sherry [name], female    DOB: [birth year], 59 y.o.   MRN: [id number]  HPI  Sherry [name] is a 59 year old female who presents to establish care for her fibromyalgia.   She is currently has a headache. She gets these often. She is not interested in Botox. She takes Excedrin as needed.  She also has a lot of muscle tightness.  Time helps the pain.  She has had fibromyalgia for may years. She used to follow with Dr. Naaman Plummer. She uses a heating pad which does provide relief. Pain medications take the edge off the pain. She is unable to tolerate family retreats in [place] because of the cold. She has been in worse pain since last visit. Ice does not help.   She takes Ibuprofen as she cannot tolerate the Tylenol. Sometimes this helps and sometimes it doesn't. She takes about 5-6 of the Ibuprofen. She takes it more when she gets home when her pain is worse.   She wants to exercise to lose some weights. Her grandkids are 21 to 4- she has 33 grandchildren and 4 great grandkids. Weight is currently 175 lbs, BMI 32.26.   She wants to date. She wants to be young again.   She did not tolerate Lyrica or Cymbalta well. Gabapentin made her gain weight. She cannot recall her response to Amitriptyline.   Average pain is 10/10, pain right now is 7/10 and feels aching on both sides of her body in multiple joints and muscles.   Pain Inventory Average Pain 10 Pain Right Now 7 My pain is aching  In the last 24 hours, has pain interfered with the following? General activity 0 Relation with others 0 Enjoyment of life 10 What TIME of day is your pain at its worst? all Sleep (in general) Poor  Pain is worse with: walking, bending, sitting, inactivity and standing Pain improves with: rest, heat/ice and medication Relief from Meds: 2   Family History  Problem Relation Age of Onset  . Diabetes Mother   . Hypertension Mother   . Depression Mother   . Diabetes Sister   .  Hypertension Father   . Cancer Father        Prostate  . Alcohol abuse Father   . Cancer Brother        Prostate  . Breast cancer Neg Hx   . Colon cancer Neg Hx   . Colon polyps Neg Hx   . Esophageal cancer Neg Hx   . Stomach cancer Neg Hx   . Rectal cancer Neg Hx    Social History   Socioeconomic History  . Marital status: Single    Spouse name: Not on file  . Number of children: 2  . Years of education: Not on file  . Highest education level: 12th grade  Occupational History  . Occupation: Unemployed  Tobacco Use  . Smoking status: Never Smoker  . Smokeless tobacco: Never Used  Vaping Use  . Vaping Use: Never used  Substance and Sexual Activity  . Alcohol use: No  . Drug use: No  . Sexual activity: Yes    Birth control/protection: None    Comment: last intercouse two months ago  Other Topics Concern  . Not on file  Social History Narrative   Lives with   2 Children: Ages 49 and 35   4 Grandchildren: 2, 3, 4 and 30   Brother      Diet: "Barely eats".  Does not exercise due to pain.      03/2011- Currently unemployed. 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 Unsure of last pap.   Unsure of last Td   Unsure of last flu shot   Social Determinants of Health   Financial Resource Strain:   . Difficulty of Paying Living Expenses: Not on file  Food Insecurity:   . Worried About Charity fundraiser in the Last Year: Not on file  . Ran Out of Food in the Last Year: Not on file  Transportation Needs:   . Lack of Transportation (Medical): Not on file  . Lack of Transportation (Non-Medical): Not on file  Physical Activity:   . Days of Exercise per Week: Not on file  . Minutes of Exercise per Session: Not on file  Stress:   . Feeling of Stress : Not on file  Social Connections:   . Frequency of Communication with Friends and Family: Not on file  . Frequency of Social Gatherings with Friends and Family: Not on  file  . Attends Religious Services: Not on file  . Active Member of Clubs or Organizations: Not on file  . Attends Archivist Meetings: Not on file  . Marital Status: Not on file   Past Surgical History:  Procedure Laterality Date  . CHOLECYSTECTOMY     2008  . ORIF CALCANEOUS FRACTURE Left 05/16/2013   Procedure: OPEN REDUCTION INTERNAL FIXATION (ORIF) LEFT CALCANEOUS FRACTURE;  Surgeon: Rozanna Box, MD;  Location: [facility];  Service: Orthopedics;  Laterality: Left;  . TOTAL ABDOMINAL HYSTERECTOMY     1986  . WISDOM TOOTH EXTRACTION     Past Medical History:  Diagnosis Date  . Acid reflux disease   . Anginal pain ([facility])    last time 04/18/13 in afternoon; referred to [name]  . Anxiety   . 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 Chest pain 04/07/2011  . Chronic pain   . Chronic pain 06/19/2014  . Cough 09/25/2011  . Depression   . Fast heart beat   . Fibromyalgia   . Headache(784.0) 12/27/2012  . History of blood transfusion 1981   childbirth  . History of kidney stones    passed  . Hypertension   . Influenza A 07/08/2017  . Left wrist pain 03/10/2013  . Migraine    last one was 04/18/13  . Sleep apnea    does not use CPAP  . Trichomonas contact, treated    BP 118/80   Pulse 88   Temp 98.7 F (37.1 C)   Ht 5' (1.524 m)   Wt 175 lb 6.4 oz (79.6 kg)   SpO2 98%   BMI 34.26 kg/m   Opioid Risk Score:   Fall Risk Score:  `1  Depression screen PHQ 2/9  Depression screen [facility] 2/9 03/20/2020 12/08/2019 11/20/2019 07/18/2019 08/03/2018 06/20/2018 05/09/2018  Decreased Interest 0 0 1 0 3 0 1  Down, Depressed, Hopeless 0 0 0 1 1 1 1   PHQ - 2 Score 0 0 1 1 4 1 2   Altered sleeping - 0 1 - 3 3 3   Tired, decreased energy - 0 1 - 3 1 3   Change in appetite - 0 1 - 1 1 2   Feeling bad or failure about yourself  - 0 0 - 1 0 3  Trouble concentrating - 0 0 - 0 0  0  Moving slowly or fidgety/restless - 0 0 - 0 1 0  Suicidal thoughts - 0 0 - 0 0 0  PHQ-9 Score - 0  4 - 12 7 13   Difficult doing work/chores - - Not difficult at all - Not difficult at all Somewhat difficult Extremely dIfficult    Review of Systems  Constitutional: Negative.   HENT: Negative.   Eyes: Negative.   Respiratory: Negative.   Cardiovascular: Negative.   Gastrointestinal: Positive for constipation and nausea.  Endocrine: Negative.   Genitourinary: Negative.   Musculoskeletal: Positive for arthralgias, back pain, myalgias and neck pain.       Spasms   Skin: Negative.   Neurological: Positive for headaches.  Hematological: Negative.   All other systems reviewed and are negative.      Objective:   Physical Exam Gen: no distress, normal appearing HEENT: oral mucosa pink and moist, NCAT Cardio: Reg rate Chest: normal effort, normal rate of breathing Abd: soft, non-distended Ext: no edema Skin: intact Neuro: Alert and oriented x3.  Musculoskeletal: Diffuse tenderness in multiple joints on both sides of the body. 5/5 strength in both upper and lower extremities with the exception of 4/5 DF/PF in left foot (prior surgery) Psych: pleasant, normal affect      Assessment & Plan:  Sherry [name] is a 59 year old woman who presents to establish care for fibromyalgia with worsening symptoms. She also has weakness and fatigue.   Fibromyalgia is a clinical syndrome characterized by widespread pain and tenderness in addition to a variety of symptoms, including fatigue, anxiety, depression, and sleep disturbances. Fibromyalgia affects 3-5% of women and up to 25% of women with other rheumatological conditions.   Exercise is a first-line treatment for the disease, and can help with both the physical and emotional symptoms, as well as improving overall health and function.   Obesity (BMI 34.26), 175 lbs.  -Our goal is to increase her exercise, improve her nutrition.  -Provided exercise and dietary counseling.   -Provided referral for aquatic therapy.   -Meloxicam did not  help.  Myofascial pain syndrome -Lidocaine 5% patch prescribed. Trigger point injections for myofascial pain next visit with lidocaine and saline  -She has failed all neuropathic agents. Can consider Sevella in the future. Can continue Ibuprofen as needed.  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 She now also works at a [employer]. She works every day.   -She does not have much appetite. She has been trying to drink more water. She likes salmon and seafood. She loves stewed chicken.   -Her daughter is pregnant. Her goal is to be more active so she can spend more time with her grandchildren.   All questions answered. RTC in 1 month for trigger point injections

## 2020-03-21 ENCOUNTER — Telehealth: Payer: Self-pay | Admitting: *Deleted

## 2020-03-21 NOTE — Telephone Encounter (Signed)
Prior Sherry [name] was initiated for Lidocaine Patches 5% #30 via CoverMyMeds/[employer].

## 2020-03-22 NOTE — Telephone Encounter (Signed)
Thank you! Would you mind letting patient know that she can buy over the counter 4% patches instead?

## 2020-03-22 NOTE — Telephone Encounter (Signed)
Incoming fax from [employer] prior authorization for lidoderm patches is denied.  Requires diagnosis of post-herpetic neuralgia

## 2020-03-26 NOTE — Telephone Encounter (Signed)
Called patient and left message that 5% patches were denied by insurance and that Dr. Ranell Patrick recommends trying 4% patches that can be purcgased OTC at a local pharmacy.

## 2020-04-12 ENCOUNTER — Other Ambulatory Visit: Payer: Self-pay | Admitting: Student

## 2020-04-12 ENCOUNTER — Telehealth: Payer: Self-pay | Admitting: Internal Medicine

## 2020-04-12 ENCOUNTER — Other Ambulatory Visit: Payer: Self-pay

## 2020-04-12 DIAGNOSIS — I10 Essential (primary) hypertension: Secondary | ICD-10-CM

## 2020-04-12 MED ORDER — LISINOPRIL-HYDROCHLOROTHIAZIDE 20-25 MG PO TABS: 1.0000 | ORAL_TABLET | Freq: Every day | ORAL | 1 refills | Status: DC

## 2020-04-12 MED FILL — PROMETHAZINE 12.5 MG TABLET: 12.5 | 20 days supply | Qty: 20 | Fill #0

## 2020-04-12 MED FILL — CYCLOBENZAPRINE HCL 10 MG T: 10 | 15 days supply | Qty: 30 | Fill #2

## 2020-04-12 MED FILL — LISINOPRIL-HCTZ 20-25 MG TA: 20-25 | 90 days supply | Qty: 90 | Fill #0

## 2020-04-12 MED FILL — PANTOPRAZOLE SOD DR 40 MG T: 40 | 90 days supply | Qty: 90 | Fill #1

## 2020-04-12 NOTE — Telephone Encounter (Signed)
Reports gradually worsening of nausea and acid reflux symptoms. Has not been evaluated for this in about 2 years it seems. Please make an appointment to discuss this

## 2020-04-12 NOTE — Telephone Encounter (Signed)
Attempted to call patient x1 regarding this prescription. Left voicemail with callback number. Will try again later.

## 2020-04-15 NOTE — Telephone Encounter (Signed)
Just spoke with patient.  She agreed to an appointment next Monday 04/22/2020 at 8:45 am.

## 2020-04-17 ENCOUNTER — Other Ambulatory Visit: Payer: Self-pay | Admitting: Physical Medicine and Rehabilitation

## 2020-04-17 ENCOUNTER — Telehealth: Payer: Self-pay

## 2020-04-17 ENCOUNTER — Other Ambulatory Visit: Payer: Self-pay

## 2020-04-17 ENCOUNTER — Encounter: Payer: Self-pay | Admitting: Physical Medicine and Rehabilitation

## 2020-04-17 ENCOUNTER — Encounter: Payer: 59 | Attending: Physical Medicine and Rehabilitation | Admitting: Physical Medicine and Rehabilitation

## 2020-04-17 VITALS — BP 113/80 | HR 101 | Temp 98.8°F | Ht 60.0 in | Wt 173.6 lb

## 2020-04-17 DIAGNOSIS — M797 Fibromyalgia: Secondary | ICD-10-CM | POA: Diagnosis not present

## 2020-04-17 DIAGNOSIS — M7918 Myalgia, other site: Secondary | ICD-10-CM | POA: Diagnosis not present

## 2020-04-17 DIAGNOSIS — Z79899 Other long term (current) drug therapy: Secondary | ICD-10-CM | POA: Insufficient documentation

## 2020-04-17 DIAGNOSIS — Z5181 Encounter for therapeutic drug level monitoring: Secondary | ICD-10-CM | POA: Diagnosis present

## 2020-04-17 DIAGNOSIS — Z79891 Long term (current) use of opiate analgesic: Secondary | ICD-10-CM | POA: Diagnosis present

## 2020-04-17 DIAGNOSIS — G894 Chronic pain syndrome: Secondary | ICD-10-CM | POA: Insufficient documentation

## 2020-04-17 MED ORDER — LIDOCAINE 5 % EX PTCH
1.0000 | MEDICATED_PATCH | CUTANEOUS | 0 refills | Status: DC
Start: 2020-04-17 — End: 2021-08-25

## 2020-04-17 MED ORDER — ACETAMINOPHEN-CODEINE #3 300-30 MG PO TABS: 1.0000 | ORAL_TABLET | Freq: Every day | ORAL | 0 refills | Status: DC | PRN

## 2020-04-17 MED FILL — ACETAMINOPHEN-COD #3 TABLET: 300-30 | 7 days supply | Qty: 7 | Fill #0

## 2020-04-17 NOTE — Telephone Encounter (Signed)
Patient wants to know if Aqua therapy would help.

## 2020-04-17 NOTE — Telephone Encounter (Signed)
Patient notified

## 2020-04-17 NOTE — Progress Notes (Signed)
Subjective:    Patient ID: Sherry [name], female    DOB: 09/30/1960, 59 y.o.   MRN: [id number]  HPI  Sherry [name] is a 59 year old female who presents for follow-up of her fibromyalgia.   Her pain has been getting worse.   She needs something to help minimize her pain at work. 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   She took Hydrocodone and Percocet before. She was taking these as needed- waiting until the pain is very severe.   Prior history: She is currently has a headache. She gets these often. She is not interested in Botox. She takes Excedrin as needed.  She also has a lot of muscle tightness.  Time helps the pain.  She has had fibromyalgia for may years. She used to follow with Dr. Naaman Plummer. She uses a heating pad which does provide relief. Pain medications take the edge off the pain. She is unable to tolerate family retreats in [place] because of the cold. She has been in worse pain since last visit. Ice does not help.   She takes Ibuprofen as she cannot tolerate the Tylenol. Sometimes this helps and sometimes it doesn't. She takes about 5-6 of the Ibuprofen. She takes it more when she gets home when her pain is worse.   She wants to exercise to lose some weights. Her grandkids are 21 to 4- she has 33 grandchildren and 4 great grandkids. Weight is currently 175 lbs, BMI 32.26.   She wants to date. She wants to be young again.   She did not tolerate Lyrica or Cymbalta well. Gabapentin made her gain weight. She cannot recall her response to Amitriptyline.   Average pain is 10/10, pain right now is 7/10 and feels aching on both sides of her body in multiple joints and muscles.   Pain Inventory Average Pain 10 Pain Right Now 6 My pain is aching  In the last 24 hours, has pain interfered with the following? General activity 10 Relation with others 1 Enjoyment of life 1 What TIME of day is your pain at its worst?  all Sleep (in general) Poor  Pain is worse with: walking, bending, sitting, inactivity and standing Pain improves with: rest, heat/ice and medication Relief from Meds: 2   Family History  Problem Relation Age of Onset  . Diabetes Mother   . Hypertension Mother   . Depression Mother   . Diabetes Sister   . Hypertension Father   . Cancer Father        Prostate  . Alcohol abuse Father   . Cancer Brother        Prostate  . Breast cancer Neg Hx   . Colon cancer Neg Hx   . Colon polyps Neg Hx   . Esophageal cancer Neg Hx   . Stomach cancer Neg Hx   . Rectal cancer Neg Hx    Social History   Socioeconomic History  . Marital status: Single    Spouse name: Not on file  . Number of children: 2  . Years of education: Not on file  . Highest education level: 12th grade  Occupational History  . Occupation: Unemployed  Tobacco Use  . Smoking status: Never Smoker  . Smokeless tobacco: Never Used  Vaping Use  . Vaping Use: Never used  Substance and Sexual Activity  . Alcohol use: No  . Drug use: No  . Sexual activity:  Yes    Birth control/protection: None    Comment: last intercouse two months ago  Other Topics Concern  . Not on file  Social History Narrative   Lives with   2 Children: Ages 50 and 62   4 Grandchildren: 2, 3, 4 and 88   Brother      Diet: "Barely eats". Does not exercise due to pain.      03/2011- Currently unemployed. 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 Unsure of last pap.   Unsure of last Td   Unsure of last flu shot   Social Determinants of Health   Financial Resource Strain:   . Difficulty of Paying Living Expenses: Not on file  Food Insecurity:   . Worried About Charity fundraiser in the Last Year: Not on file  . Ran Out of Food in the Last Year: Not on file  Transportation Needs:   . Lack of Transportation (Medical): Not on file  . Lack of Transportation (Non-Medical): Not on  file  Physical Activity:   . Days of Exercise per Week: Not on file  . Minutes of Exercise per Session: Not on file  Stress:   . Feeling of Stress : Not on file  Social Connections:   . Frequency of Communication with Friends and Family: Not on file  . Frequency of Social Gatherings with Friends and Family: Not on file  . Attends Religious Services: Not on file  . Active Member of Clubs or Organizations: Not on file  . Attends Archivist Meetings: Not on file  . Marital Status: Not on file   Past Surgical History:  Procedure Laterality Date  . CHOLECYSTECTOMY     2008  . ORIF CALCANEOUS FRACTURE Left 05/16/2013   Procedure: OPEN REDUCTION INTERNAL FIXATION (ORIF) LEFT CALCANEOUS FRACTURE;  Surgeon: Rozanna Box, MD;  Location: [facility];  Service: Orthopedics;  Laterality: Left;  . TOTAL ABDOMINAL HYSTERECTOMY     1986  . WISDOM TOOTH EXTRACTION     Past Medical History:  Diagnosis Date  . Acid reflux disease   . Anginal pain ([place])    last time 04/18/13 in afternoon; referred to [name]  . Anxiety   . 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 Chest pain 04/07/2011  . Chronic pain   . Chronic pain 06/19/2014  . Cough 09/25/2011  . Depression   . Fast heart beat   . Fibromyalgia   . Headache(784.0) 12/27/2012  . History of blood transfusion 1981   childbirth  . History of kidney stones    passed  . Hypertension   . Influenza A 07/08/2017  . Left wrist pain 03/10/2013  . Migraine    last one was 04/18/13  . Sleep apnea    does not use CPAP  . Trichomonas contact, treated    BP 113/80   Pulse (!) 101   Temp 98.8 F (37.1 C)   Ht 5' (1.524 m)   Wt 173 lb 9.6 oz (78.7 kg)   SpO2 94%   BMI 33.90 kg/m   Opioid Risk Score:   Fall Risk Score:  `1  Depression screen PHQ 2/9  Depression screen [facility] 2/9 03/20/2020 12/08/2019 11/20/2019 07/18/2019 08/03/2018 06/20/2018 05/09/2018  Decreased Interest 0 0 1 0 3 0 1  Down, Depressed, Hopeless 0 0 0 1 1 1 1    PHQ - 2 Score  0 0 1 1 4 1 2   Altered sleeping - 0 1 - 3 3 3   Tired, decreased energy - 0 1 - 3 1 3   Change in appetite - 0 1 - 1 1 2   Feeling bad or failure about yourself  - 0 0 - 1 0 3  Trouble concentrating - 0 0 - 0 0 0  Moving slowly or fidgety/restless - 0 0 - 0 1 0  Suicidal thoughts - 0 0 - 0 0 0  PHQ-9 Score - 0 4 - 12 7 13   Difficult doing work/chores - - Not difficult at all - Not difficult at all Somewhat difficult Extremely dIfficult    Review of Systems  Constitutional: Negative.   HENT: Negative.   Eyes: Negative.   Respiratory: Negative.   Cardiovascular: Negative.   Gastrointestinal: Positive for constipation and nausea.  Endocrine: Negative.   Genitourinary: Negative.   Musculoskeletal: Positive for arthralgias, back pain, myalgias and neck pain.       Spasms   Skin: Negative.   Neurological: Positive for headaches.  Hematological: Negative.   All other systems reviewed and are negative.      Objective:   Physical Exam Gen: no distress, normal appearing HEENT: oral mucosa pink and moist, NCAT Cardio: Reg rate Chest: normal effort, normal rate of breathing Abd: soft, non-distended Ext: no edema Skin: intact Neuro: Alert and oriented x3.  Musculoskeletal: Diffuse tenderness in multiple joints on both sides of the body. 5/5 strength in both upper and lower extremities with the exception of 4/5 DF/PF in left foot (prior surgery) Psych: pleasant, normal affect     Assessment & Plan:  Sherry [name] is a 59 year old woman who presents to establish care for fibromyalgia with worsening symptoms. She also has weakness and fatigue.   Fibromyalgia is a clinical syndrome characterized by widespread pain and tenderness in addition to a variety of symptoms, including fatigue, anxiety, depression, and sleep disturbances. Fibromyalgia affects 3-5% of women and up to 25% of women with other rheumatological conditions.   Exercise is a first-line treatment for the  disease, and can help with both the physical and emotional symptoms, as well as improving overall health and function.   -She tends to have worst symptoms in the winter, the cold worsens her symptoms -Commended her idea to bring her a heating pad at work.  -Pain is currently worse in her lower back and bilateral lower extremities.  -She is motivated to continue working and works 52 hours per week.  -She has failed all neuropathic medications. Discussed side effects of Savella and she chose not to try this. Discussed side effects of tylenol with codeine and she prefers this option. Advised to use for only severe pain.  -Provided referral for aquatic therapy.  -Pain contract and UDS signed today.   Obesity (BMI 34.26), 175 lbs.  -Our goal is to increase her exercise, improve her nutrition.  -Provided exercise and dietary counseling.   -Provided referral for aquatic therapy.   -Meloxicam did not help.  Myofascial pain syndrome -Provided script for Lidocaine 5% patch. Can consider trigger point injections for myofascial pain in future with lidocaine and saline  -She walks a lot as part of her work  -She does not have much appetite. She has been trying to drink more water. She likes salmon and seafood. She loves stewed chicken.   -Her daughter is pregnant. Her goal is to be more active so she can spend more time with her grandchildren.  All questions answered. RTC in 1 month for f/i

## 2020-04-17 NOTE — Telephone Encounter (Signed)
Absolutely! I will send a referral for her for this

## 2020-04-19 NOTE — Progress Notes (Signed)
   CC: nausea, sleep difficulty, L foot gait problem  HPI:  [name] is a 59 y.o. female with history as below significant for Acid reflux, fibromyalgia, sleep apnea, HTN, HLD presenting for nausea. Please refer to problem based charting for further details of assessment and plan of current problem and chronic medical conditions.  Past Medical History:  Diagnosis Date  . Acid reflux disease   . Anginal pain ([place])    last time 04/18/13 in afternoon; referred to [name]  . Anxiety   . 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 Chest pain 04/07/2011  . Chronic pain   . Chronic pain 06/19/2014  . Cough 09/25/2011  . Depression   . Fast heart beat   . Fibromyalgia   . Headache(784.0) 12/27/2012  . History of blood transfusion 1981   childbirth  . History of kidney stones    passed  . Hypertension   . Influenza A 07/08/2017  . Left wrist pain 03/10/2013  . Migraine    last one was 04/18/13  . Sleep apnea    does not use CPAP  . Trichomonas contact, treated    Review of Systems:   Review of Systems  Constitutional: Negative for chills, fever, malaise/fatigue and weight loss.  Respiratory: Negative for cough and shortness of breath.   Cardiovascular: Negative for chest pain and palpitations.  Gastrointestinal: Positive for abdominal pain and nausea. Negative for blood in stool, constipation, diarrhea, melena and vomiting.  All other systems reviewed and are negative.    Physical Exam: Vitals:   04/22/20 0842  BP: 129/80  Pulse: 96  SpO2: 98%  Weight: 178 lb 1.6 oz (80.8 kg)   Constitutional: no acute distress Head: atraumatic ENT: external ears normal Cardiovascular: regular rate and rhythm, normal heart sounds Pulmonary: effort normal, normal breath sounds bilaterally Abdominal: flat, nontender, no rebound tenderness, bowel sounds normal Musculoskeletal: Left foot externally rotates when ambulating, but gait is stable Skin: warm and  dry Neurological: alert, no focal deficit Psychiatric: normal mood and affect  Assessment & Plan:   See Encounters Tab for problem based charting.  Patient seen with Dr. Rebeca Alert

## 2020-04-22 ENCOUNTER — Other Ambulatory Visit: Payer: Self-pay

## 2020-04-22 ENCOUNTER — Other Ambulatory Visit: Payer: Self-pay | Admitting: Student

## 2020-04-22 ENCOUNTER — Encounter: Payer: Self-pay | Admitting: Student

## 2020-04-22 ENCOUNTER — Ambulatory Visit (INDEPENDENT_AMBULATORY_CARE_PROVIDER_SITE_OTHER): Payer: 59 | Admitting: Student

## 2020-04-22 VITALS — BP 129/80 | HR 96 | Temp 98.4°F | Wt 178.1 lb

## 2020-04-22 DIAGNOSIS — K219 Gastro-esophageal reflux disease without esophagitis: Secondary | ICD-10-CM

## 2020-04-22 DIAGNOSIS — E119 Type 2 diabetes mellitus without complications: Secondary | ICD-10-CM | POA: Insufficient documentation

## 2020-04-22 DIAGNOSIS — S99002S Unspecified physeal fracture of left calcaneus, sequela: Secondary | ICD-10-CM

## 2020-04-22 DIAGNOSIS — E8881 Metabolic syndrome: Secondary | ICD-10-CM

## 2020-04-22 DIAGNOSIS — R269 Unspecified abnormalities of gait and mobility: Secondary | ICD-10-CM

## 2020-04-22 DIAGNOSIS — E1165 Type 2 diabetes mellitus with hyperglycemia: Secondary | ICD-10-CM

## 2020-04-22 DIAGNOSIS — G4733 Obstructive sleep apnea (adult) (pediatric): Secondary | ICD-10-CM

## 2020-04-22 LAB — POCT GLYCOSYLATED HEMOGLOBIN (HGB A1C): Hemoglobin A1C: 7.4 % — AB (ref 4.0–5.6)

## 2020-04-22 LAB — GLUCOSE, CAPILLARY: Glucose-Capillary: 150 mg/dL — ABNORMAL HIGH (ref 70–99)

## 2020-04-22 LAB — TOXASSURE SELECT,+ANTIDEPR,UR

## 2020-04-22 MED ORDER — PROMETHAZINE HCL 12.5 MG PO TABS: 12.5000 mg | ORAL_TABLET | Freq: Every day | ORAL | 2 refills | Status: DC | PRN

## 2020-04-22 MED ORDER — PANTOPRAZOLE SODIUM 40 MG PO TBEC: 40.0000 mg | DELAYED_RELEASE_TABLET | Freq: Two times a day (BID) | ORAL | 1 refills | Status: DC

## 2020-04-22 NOTE — Patient Instructions (Addendum)
Thank you for allowing [place] to be a part of your care today, it was a pleasure seeing you. We discussed your acid reflux with nausea, sleep apnea, diabetes, gait issue  I have made these changes to your medications: Increase pantoprazole to twice a day Refilled Phenergan  I have made several referrals, as below: Gastroenterology-for your GERD and nausea Ophthalmology-for diabetic eye exam and your blurred vision Nutritionist-to help with diet changes Physical therapy-for your gait issue Sleep medicine-for your sleep apnea  We are increasing your Protonix to twice a day to help with your acid reflux.  I am also refilling your Phenergan, refer you to gastroenterology for further investigation.  Please try to lay off the sugary drinks  Please follow up in 4 months   Thank you, and please call the Internal Medicine Clinic at [phone] if you have any questions.  Best, Dr. Bridgett Larsson

## 2020-04-22 NOTE — Assessment & Plan Note (Signed)
Patient reports frequent nighttime awakenings and snoring.  Also complains of daytime drowsiness.  Denies falling asleep in traffic, but takes daily naps during her lunch break at work.  Does not feel unsafe due to her drowsiness. She reports a history of sleep apnea and was on CPAP mask in the past, but tolerated it poorly.  She is reluctant to try CPAP again.  Discussed with her that there are several masks available now and some might be more tolerable.  -Referral to sleep medicine for repeat sleep study and trial of different masks

## 2020-04-22 NOTE — Assessment & Plan Note (Signed)
Has had GERD for over 10 years which has worsened over the past month or so.  She is on on daily protonix, also has phenergan prescribed for nausea which she uses roughly twice a week. Notes increased symptoms on days that she misses Protonix or after eating tomato sauce. Also uses ginger ale for nausea symptoms. No no red flag symptoms including dysphagia, odynphagia, blood, melena, anorexia, weight loss. A1c of 7.4 today, new diagnosis of diabetes with prior A1c of 6.4 in July 2020. Denies early satiety.  -Increase Protonix to twice a day -Continue Phenergan as needed -Refer to GI for further evaluation

## 2020-04-22 NOTE — Assessment & Plan Note (Addendum)
Hemoglobin A1C  Date Value Ref Range Status  04/22/2020 7.4 (A) 4.0 - 5.6 % Final  12/28/2018 6.4 (A) 4.0 - 5.6 % Final  09/10/2011 6.2  Final  New diagnosis of diabetes this visit.  Denies neuropathy or foot wounds.  Foot exam benign.  Recommend starting Metformin, but she is hesitant to start medication at this time.  She would like to first attempt diet changes.  -Discussed diet changes including cutting out sugary drinks -Referral to diabetic educator for further nutritional help -Referred to ophthalmology for diabetic eye exam -Follow-up in 3 months for repeat A1c, consider starting Metformin if still elevated

## 2020-04-22 NOTE — Assessment & Plan Note (Signed)
Reports recently noting that her left foot rotates externally during ambulating.  On exam, she does have significant degree of external rotation of the left foot only but her gait appears stable.  Does note some pain in the foot, noting prior injury.  -Refer to physical therapy to help with gait

## 2020-04-23 ENCOUNTER — Telehealth: Payer: Self-pay | Admitting: *Deleted

## 2020-04-23 NOTE — Telephone Encounter (Signed)
Urine drug screen for this encounter is consistent for prescribed medication 

## 2020-04-23 NOTE — Addendum Note (Signed)
Addended by: Andrew Au on: 04/23/2020 10:15 AM   Modules accepted: Orders

## 2020-04-25 MED FILL — ACETAMINOPHEN-COD #3 TABLET: 300-30 | 7 days supply | Qty: 7 | Fill #1

## 2020-05-04 NOTE — Progress Notes (Signed)
Internal Medicine Clinic Attending  I saw and evaluated the patient.  I personally confirmed the key portions of the history and exam documented by Dr. Bridgett Larsson and I reviewed pertinent patient test results.  The assessment, diagnosis, and plan were formulated together and I agree with the documentation in the resident's note.  Lenice Pressman, M.D., Ph.D.

## 2020-05-07 ENCOUNTER — Encounter: Payer: Self-pay | Admitting: *Deleted

## 2020-05-10 ENCOUNTER — Emergency Department (HOSPITAL_COMMUNITY)
Admission: EM | Admit: 2020-05-10 | Discharge: 2020-05-10 | Disposition: A | Discharge status: Home / Self Care | Payer: 59 | Attending: Emergency Medicine | Admitting: Emergency Medicine

## 2020-05-10 ENCOUNTER — Emergency Department (HOSPITAL_COMMUNITY): Payer: 59

## 2020-05-10 ENCOUNTER — Other Ambulatory Visit: Payer: Self-pay

## 2020-05-10 ENCOUNTER — Encounter (HOSPITAL_COMMUNITY): Payer: Self-pay

## 2020-05-10 DIAGNOSIS — S0990XA Unspecified injury of head, initial encounter: Secondary | ICD-10-CM | POA: Diagnosis present

## 2020-05-10 DIAGNOSIS — R1084 Generalized abdominal pain: Secondary | ICD-10-CM | POA: Insufficient documentation

## 2020-05-10 DIAGNOSIS — Y92410 Unspecified street and highway as the place of occurrence of the external cause: Secondary | ICD-10-CM | POA: Diagnosis not present

## 2020-05-10 DIAGNOSIS — M542 Cervicalgia: Secondary | ICD-10-CM | POA: Diagnosis not present

## 2020-05-10 DIAGNOSIS — Z79899 Other long term (current) drug therapy: Secondary | ICD-10-CM | POA: Insufficient documentation

## 2020-05-10 DIAGNOSIS — R079 Chest pain, unspecified: Secondary | ICD-10-CM | POA: Diagnosis not present

## 2020-05-10 DIAGNOSIS — I10 Essential (primary) hypertension: Secondary | ICD-10-CM | POA: Diagnosis not present

## 2020-05-10 DIAGNOSIS — Z8616 Personal history of COVID-19: Secondary | ICD-10-CM | POA: Insufficient documentation

## 2020-05-10 DIAGNOSIS — E119 Type 2 diabetes mellitus without complications: Secondary | ICD-10-CM | POA: Diagnosis not present

## 2020-05-10 DIAGNOSIS — T1490XA Injury, unspecified, initial encounter: Secondary | ICD-10-CM

## 2020-05-10 DIAGNOSIS — M797 Fibromyalgia: Secondary | ICD-10-CM

## 2020-05-10 DIAGNOSIS — Z7982 Long term (current) use of aspirin: Secondary | ICD-10-CM | POA: Diagnosis not present

## 2020-05-10 LAB — COMPREHENSIVE METABOLIC PANEL
ALT: 24 U/L (ref 0–44)
AST: 21 U/L (ref 15–41)
Albumin: 3.7 g/dL (ref 3.5–5.0)
Alkaline Phosphatase: 80 U/L (ref 38–126)
Anion gap: 12 (ref 5–15)
BUN: 10 mg/dL (ref 6–20)
CO2: 25 mmol/L (ref 22–32)
Calcium: 9.7 mg/dL (ref 8.9–10.3)
Chloride: 101 mmol/L (ref 98–111)
Creatinine, Ser: 0.83 mg/dL (ref 0.44–1.00)
GFR, Estimated: >60 mL/min (ref >60)
Glucose, Bld: 125 mg/dL — ABNORMAL HIGH (ref 70–99)
Potassium: 4 mmol/L (ref 3.5–5.1)
Sodium: 138 mmol/L (ref 135–145)
Total Bilirubin: 0.5 mg/dL (ref 0.3–1.2)
Total Protein: 7.2 g/dL (ref 6.5–8.1)

## 2020-05-10 LAB — I-STAT CHEM 8, ED
BUN: 11 mg/dL (ref 6–20)
Calcium, Ion: 1.22 mmol/L (ref 1.15–1.40)
Chloride: 101 mmol/L (ref 98–111)
Creatinine, Ser: 0.7 mg/dL (ref 0.44–1.00)
Glucose, Bld: 123 mg/dL — ABNORMAL HIGH (ref 70–99)
HCT: 38 % (ref 36.0–46.0)
Hemoglobin: 12.9 g/dL (ref 12.0–15.0)
Potassium: 3.8 mmol/L (ref 3.5–5.1)
Sodium: 139 mmol/L (ref 135–145)
TCO2: 25 mmol/L (ref 22–32)

## 2020-05-10 LAB — SAMPLE TO BLOOD BANK

## 2020-05-10 LAB — CBC
HCT: 36.1 % (ref 36.0–46.0)
Hemoglobin: 12.1 g/dL (ref 12.0–15.0)
MCH: 30.6 pg (ref 26.0–34.0)
MCHC: 33.5 g/dL (ref 30.0–36.0)
MCV: 91.4 fL (ref 80.0–100.0)
Platelets: 486 10*3/uL — ABNORMAL HIGH (ref 150–400)
RBC: 3.95 MIL/uL (ref 3.87–5.11)
RDW: 13.4 % (ref 11.5–15.5)
WBC: 9.8 10*3/uL (ref 4.0–10.5)
nRBC: 0 % (ref 0.0–0.2)

## 2020-05-10 LAB — ETHANOL: Alcohol, Ethyl (B): <10 mg/dL (ref <10)

## 2020-05-10 LAB — LACTIC ACID, PLASMA: Lactic Acid, Venous: 0.9 mmol/L (ref 0.5–1.9)

## 2020-05-10 LAB — PROTIME-INR
INR: 1 (ref 0.8–1.2)
Prothrombin Time: 13 seconds (ref 11.4–15.2)

## 2020-05-10 MED ORDER — CYCLOBENZAPRINE HCL 10 MG PO TABS
10.0000 mg | ORAL_TABLET | Freq: Once | ORAL | Status: DC
  Filled 2020-05-10: qty 1

## 2020-05-10 MED ORDER — FENTANYL CITRATE (PF) 100 MCG/2ML IJ SOLN
50.0000 ug | Freq: Once | INTRAMUSCULAR | Status: AC
  Administered 2020-05-10: 50 ug via INTRAVENOUS
  Filled 2020-05-10: qty 2

## 2020-05-10 MED ORDER — IOHEXOL 300 MG/ML  SOLN
100.0000 mL | Freq: Once | INTRAMUSCULAR | Status: AC | PRN
  Administered 2020-05-10: 100 mL via INTRAVENOUS

## 2020-05-10 MED ORDER — HYDROCODONE-ACETAMINOPHEN 5-325 MG PO TABS
1.0000 | ORAL_TABLET | Freq: Once | ORAL | Status: AC
  Administered 2020-05-10: 1 via ORAL
  Filled 2020-05-10: qty 1

## 2020-05-10 MED ORDER — ONDANSETRON HCL 4 MG/2ML IJ SOLN
4.0000 mg | Freq: Once | INTRAMUSCULAR | Status: AC
  Administered 2020-05-10: 4 mg via INTRAVENOUS
  Filled 2020-05-10: qty 2

## 2020-05-10 MED ORDER — CYCLOBENZAPRINE HCL 10 MG PO TABS: 10.0000 mg | ORAL_TABLET | Freq: Two times a day (BID) | ORAL | 0 refills | Status: AC | PRN

## 2020-05-10 NOTE — ED Notes (Signed)
Ambulated pt in hall
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
## 2020-05-10 NOTE — ED Notes (Signed)
Ambulated no prob

## 2020-05-10 NOTE — Discharge Instructions (Signed)
Thank you for allowing [place] to take care of you.  You can take 1000 mg of Tylenol every 6 hours as needed for pain (with a max of 4000 mg/day) and/or 400 mg Advil every 6 hours as needed.
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
## 2020-05-13 ENCOUNTER — Telehealth: Payer: Self-pay | Admitting: *Deleted

## 2020-05-13 MED FILL — CYCLOBENZAPRINE HCL 10 MG T: 10 | 15 days supply | Qty: 30 | Fill #3

## 2020-05-13 MED FILL — PROMETHAZINE 12.5 MG TABLET: 12.5 | 20 days supply | Qty: 20 | Fill #0
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
## 2020-05-14 ENCOUNTER — Ambulatory Visit (INDEPENDENT_AMBULATORY_CARE_PROVIDER_SITE_OTHER): Payer: 59 | Admitting: Internal Medicine

## 2020-05-14 ENCOUNTER — Encounter: Payer: Self-pay | Admitting: Internal Medicine

## 2020-05-14 ENCOUNTER — Other Ambulatory Visit: Payer: Self-pay

## 2020-05-14 VITALS — BP 146/93 | HR 99 | Temp 98.3°F | Ht 60.0 in | Wt 177.7 lb

## 2020-05-14 DIAGNOSIS — S060XAA Concussion with loss of consciousness status unknown, initial encounter: Secondary | ICD-10-CM | POA: Insufficient documentation

## 2020-05-14 DIAGNOSIS — S060X0A Concussion without loss of consciousness, initial encounter: Secondary | ICD-10-CM

## 2020-05-14 DIAGNOSIS — F430 Acute stress reaction: Secondary | ICD-10-CM | POA: Insufficient documentation

## 2020-05-14 DIAGNOSIS — S060X9A Concussion with loss of consciousness of unspecified duration, initial encounter: Secondary | ICD-10-CM | POA: Insufficient documentation
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
## 2020-05-15 MED FILL — ACETAMINOPHEN-COD #3 TABLET: 300-30 | 7 days supply | Qty: 7 | Fill #2

## 2020-05-15 NOTE — Progress Notes (Signed)
Internal Medicine Clinic Attending  Case discussed with Dr. Christian  At the time of the visit.  We reviewed the resident's history and exam and pertinent patient test results.  I agree with the assessment, diagnosis, and plan of care documented in the resident's note.  

## 2020-05-16 ENCOUNTER — Encounter: Payer: Self-pay | Admitting: Dietician

## 2020-05-16 ENCOUNTER — Ambulatory Visit (INDEPENDENT_AMBULATORY_CARE_PROVIDER_SITE_OTHER): Payer: 59 | Admitting: Dietician

## 2020-05-16 ENCOUNTER — Other Ambulatory Visit: Payer: Self-pay | Admitting: Dietician

## 2020-05-16 ENCOUNTER — Other Ambulatory Visit: Payer: Self-pay | Admitting: Internal Medicine

## 2020-05-16 DIAGNOSIS — E1165 Type 2 diabetes mellitus with hyperglycemia: Secondary | ICD-10-CM

## 2020-05-16 DIAGNOSIS — Z713 Dietary counseling and surveillance: Secondary | ICD-10-CM | POA: Diagnosis not present

## 2020-05-16 MED ORDER — ONETOUCH VERIO VI STRP: ORAL_STRIP | 5 refills | Status: DC

## 2020-05-16 MED ORDER — ONETOUCH DELICA PLUS LANCET33G MISC: 5 refills | Status: DC

## 2020-05-16 MED FILL — ONETOUCH DELICA PLUS LANCET: 50 days supply | Qty: 100 | Fill #0

## 2020-05-16 MED FILL — ONETOUCH VERIO TEST STRIP: 50 days supply | Qty: 100 | Fill #0

## 2020-05-16 NOTE — Patient Instructions (Addendum)
Thank you for your visit today!  You said your goals are to :  Lose weight   Eat healthy  Not need diabetes medicines for as long as possible   We talked about what a health y diet looks like using the DASH meal plan.  Your goal for the next few weeks is to try the overnight oats, eat healthier- more veggies than grains at meals.  Check your blood sugar as much as you want and when you want to - it is all about what you want to learn.  checking before and . 5-2 hours after a meal can tell you if you ate the right amount for you!  Bring your meter to our next visit.   Butch Penny [phone]

## 2020-05-16 NOTE — Telephone Encounter (Signed)
Request su[p[;ies for new meter
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
## 2020-05-17 ENCOUNTER — Encounter: Payer: Self-pay | Admitting: Student

## 2020-05-19 ENCOUNTER — Telehealth (HOSPITAL_COMMUNITY): Payer: Self-pay | Admitting: Emergency Medicine

## 2020-05-19 NOTE — Telephone Encounter (Signed)
Spoke with Ms. Propp, confirmed identity. Updated her on CT scan and concern for thyroid nodules requiring outpatient [place]. Patient voiced understanding. States she is improving but still has a knot on her head. Has followed up outpatient already.  Darrick Huntsman, MD 05/19/2020 2:56PM

## 2020-05-24 ENCOUNTER — Encounter: Payer: 59 | Admitting: Physical Medicine and Rehabilitation

## 2020-05-28 ENCOUNTER — Ambulatory Visit: Payer: 59 | Admitting: Behavioral Health

## 2020-05-28 ENCOUNTER — Ambulatory Visit: Payer: 59 | Attending: Internal Medicine | Admitting: Physical Therapy

## 2020-05-28 ENCOUNTER — Other Ambulatory Visit: Payer: Self-pay

## 2020-05-28 DIAGNOSIS — R262 Difficulty in walking, not elsewhere classified: Secondary | ICD-10-CM | POA: Diagnosis present

## 2020-05-28 DIAGNOSIS — R252 Cramp and spasm: Secondary | ICD-10-CM | POA: Diagnosis present

## 2020-05-28 DIAGNOSIS — F430 Acute stress reaction: Secondary | ICD-10-CM

## 2020-05-28 DIAGNOSIS — M5440 Lumbago with sciatica, unspecified side: Secondary | ICD-10-CM | POA: Insufficient documentation

## 2020-05-28 DIAGNOSIS — M6281 Muscle weakness (generalized): Secondary | ICD-10-CM

## 2020-05-28 DIAGNOSIS — M25672 Stiffness of left ankle, not elsewhere classified: Secondary | ICD-10-CM | POA: Diagnosis present

## 2020-05-28 DIAGNOSIS — G8929 Other chronic pain: Secondary | ICD-10-CM | POA: Diagnosis present

## 2020-05-28 DIAGNOSIS — M546 Pain in thoracic spine: Secondary | ICD-10-CM | POA: Diagnosis present

## 2020-05-28 NOTE — Patient Instructions (Addendum)
Sleep Tips    .Keep a consistent sleep schedule. Get up at the same time every day, even on weekends or during vacations. .Set a bedtime that is early enough for you to get at least 7 hours of sleep. .Don't go to bed unless you are sleepy.  .If you don't fall asleep after 20 minutes, get out of bed.  .Establish a relaxing bedtime routine.  .Use your bed only for sleep and sex.  .Make your bedroom quiet and relaxing. Keep the room at a comfortable, cool temperature.  .Limit exposure to bright light in the evenings. .Turn off electronic devices at least 30 minutes before bedtime. .Don't eat a large meal before bedtime. If you are hungry at night, eat a light, healthy snack.  .Exercise regularly and maintain a healthy diet.  Marland KitchenAvoid consuming caffeine in the late afternoon or evening.  .Avoid consuming alcohol before bedtime.  .Reduce your fluid intake before bedtime.       Aquatic Therapy: What to Expect!  Where:  [facility]           NOTE: You will receive an automated phone message [address] [facility]           reminding you of your appointment and it will say the  [place], [place]  [address]           appointment is at the [address] [facility].  We are  [phone]             working to fix this - just know that you will meet [place] at                pool!   How to Prepare: . Please make sure you drink 8 ounces of water about one hour prior to your pool session . A caregiver may attend if needed with the patient to help assist as needed. A caregiver can sit in the bleachers next to the pool. . Please arrive IN YOUR SUIT and 15 minutes prior to your appointment - this helps to avoid delays in starting your session. . Please make sure to attend to any toileting needs prior to entering the pool . Locker rooms for changing are located to the right of the check -in desk.  There is direct access to the pool deck form the locker room.  You can lock your belongings in  a locker but you must bring you own lock. . Once on the pool deck your therapist will ask you to sign the Patient  Consent and Assignment of Benefits form . Your therapist may take your blood pressure prior to, during and after your session if indicated . We usually try and create a home exercise program based on activities we do in the pool.  Please be thinking about who might be able to assist you in the pool should you need to participate in an aquatic home exercise program at the time of discharge if you need assistance.  Some patients do not want to or do not have the ability to participate in an aquatic home program - this is not a barrier in any way to you participating in aquatic therapy as part of your current therapy plan! . After Discharge from PT, you can continue using home program at  the [facility], there is a drop-in fee for $5 ($45 a month)or for 60 years  or older $4.00 ($40 a month for seniors )    About  the pool: 1. Entering the pool Your therapist will assist you; there are multiple ways to enter including stairs with railings, a walk in ramp, a roll in chair and a mechanical lift. Your therapist will determine the most appropriate way for you. 2. Water temperature is usually between 86-87 degrees 3. There may be other swimmers in the pool at the same time 4. There is availability at the pool for     Contact Info:             Appointments: Please call the [place] if you need to cancel or reschedule an appointment. [place]   [place], [place]            All sessions are 45 minutes      [place], PT, [facility] Certified Exercise Expert for the Aging Adult  05/28/20 8:46 AM Phone: [phone] Fax: [phone]
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
## 2020-06-04 ENCOUNTER — Other Ambulatory Visit: Payer: Self-pay

## 2020-06-04 ENCOUNTER — Ambulatory Visit: Payer: 59 | Admitting: Behavioral Health

## 2020-06-04 DIAGNOSIS — F430 Acute stress reaction: Secondary | ICD-10-CM
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
## 2020-06-05 ENCOUNTER — Ambulatory Visit: Payer: 59 | Admitting: Neurology

## 2020-06-05 ENCOUNTER — Other Ambulatory Visit: Payer: Self-pay

## 2020-06-05 ENCOUNTER — Encounter: Payer: Self-pay | Admitting: Neurology

## 2020-06-05 VITALS — BP 135/90 | HR 97 | Ht 60.0 in | Wt 174.0 lb

## 2020-06-05 DIAGNOSIS — G4733 Obstructive sleep apnea (adult) (pediatric): Secondary | ICD-10-CM

## 2020-06-05 DIAGNOSIS — E669 Obesity, unspecified: Secondary | ICD-10-CM | POA: Diagnosis not present

## 2020-06-05 DIAGNOSIS — R519 Headache, unspecified: Secondary | ICD-10-CM | POA: Diagnosis not present

## 2020-06-05 DIAGNOSIS — G4719 Other hypersomnia: Secondary | ICD-10-CM | POA: Diagnosis not present

## 2020-06-05 MED FILL — CYCLOBENZAPRINE HCL 10 MG T: 10 | 15 days supply | Qty: 30 | Fill #4

## 2020-06-05 MED FILL — ONETOUCH DELICA PLUS LANCET: 30 days supply | Qty: 100 | Fill #0

## 2020-06-05 MED FILL — PROMETHAZINE 12.5 MG TABLET: 12.5 | 20 days supply | Qty: 20 | Fill #1

## 2020-06-05 MED FILL — ACETAMINOPHEN-COD #3 TABLET: 300-30 | 7 days supply | Qty: 7 | Fill #3

## 2020-06-05 MED FILL — ONETOUCH VERIO TEST STRIP: 30 days supply | Qty: 100 | Fill #0

## 2020-06-05 NOTE — Progress Notes (Signed)
Subjective:    Patient ID: Sherry [name] is a 59 y.o. female.  HPI     Star Age, MD, PhD [facility] [address] [place], [place] [address]  Dear Drs. Bridgett Larsson and Rebeca Alert,   I saw your patient, Sherry [name], upon your kind request, in my sleep clinic today for initial consultation of her sleep disorder, in particular, evaluation of her prior diagnosis of obstructive sleep apnea.  The patient is unaccompanied today.  As you know, [name] is a 59 year old woman with an underlying medical history of hypertension, kidney stones, depression, anxiety, chronic pain, fibromyalgia, reflux disease, headaches and obesity, who was previously diagnosed with obstructive sleep apnea.  She could not tolerate CPAP in the past.  Prior sleep study results are not available for my review today.  I reviewed your office note from 04/22/2020.  Her Epworth sleepiness score is 16 out of 24.  She reports that she had trouble tolerating the headgear and the mask and even the machine was bulky.  She would be willing to get retested and consider CPAP therapy again.  She has a variable schedule for her sleep, she has trouble going to sleep and maintaining sleep.  Generally, she tries to be in bed somewhere between 10 PM and midnight.  Rise time is around 5 AM.  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  She has a TV in her bedroom and it sometimes stays on all night.  She lives with her daughter and her sister.  She takes melatonin as needed.  In the past, she tried Ambien.  She reports that her prior sleep study was over 15 years ago.  She has occasional morning headaches but no night to night nocturia.  She drinks caffeine in the form of soda occasionally.  She is a non-smoker and does not drink alcohol.  She is currently working on weight loss.  Her Past Medical History Is Significant For: Past Medical History:  Diagnosis Date  . Acid reflux disease   .  Anginal pain ([place])    last time 04/18/13 in afternoon; referred to [name]  . Anxiety   . 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 Chest pain 04/07/2011  . Chronic pain   . Chronic pain 06/19/2014  . Cough 09/25/2011  . Depression   . Fast heart beat   . Fibromyalgia   . Headache(784.0) 12/27/2012  . History of blood transfusion 1981   childbirth  . History of kidney stones    passed  . Hypertension   . Influenza A 07/08/2017  . Left wrist pain 03/10/2013  . Migraine    last one was 04/18/13  . Sleep apnea    does not use CPAP  . Trichomonas contact, treated     Her Past Surgical History Is Significant For: Past Surgical History:  Procedure Laterality Date  . CHOLECYSTECTOMY     2008  . ORIF CALCANEOUS FRACTURE Left 05/16/2013   Procedure: OPEN REDUCTION INTERNAL FIXATION (ORIF) LEFT CALCANEOUS FRACTURE;  Surgeon: Rozanna Box, MD;  Location: [facility];  Service: Orthopedics;  Laterality: Left;  . TOTAL ABDOMINAL HYSTERECTOMY     1986  . WISDOM TOOTH EXTRACTION      Her Family History Is Significant For: Family History  Problem Relation Age of Onset  . Diabetes Mother   . Hypertension Mother   . Depression Mother   . Diabetes Sister   .  Hypertension Father   . Cancer Father        Prostate  . Alcohol abuse Father   . Cancer Brother        Prostate  . Breast cancer Neg Hx   . Colon cancer Neg Hx   . Colon polyps Neg Hx   . Esophageal cancer Neg Hx   . Stomach cancer Neg Hx   . Rectal cancer Neg Hx     Her Social History Is Significant For: Social History   Socioeconomic History  . Marital status: Single    Spouse name: Not on file  . Number of children: 2  . Years of education: Not on file  . Highest education level: 12th grade  Occupational History  . Occupation: Unemployed  Tobacco Use  . Smoking status: Never Smoker  . Smokeless tobacco: Never Used  Vaping Use  . Vaping Use: Never used  Substance and Sexual Activity  . Alcohol use: No   . Drug use: No  . Sexual activity: Yes    Birth control/protection: None    Comment: last intercouse two months ago  Other Topics Concern  . Not on file  Social History Narrative   Lives with   2 Children: Ages 50 and 19   4 Grandchildren: 2, 3, 4 and 58   Brother      Diet: "Barely eats". Does not exercise due to pain.      03/2011- Currently unemployed. 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 Unsure of last pap.   Unsure of last Td   Unsure of last flu shot   Social Determinants of Health   Financial Resource Strain: Not on file  Food Insecurity: Not on file  Transportation Needs: Not on file  Physical Activity: Not on file  Stress: Not on file  Social Connections: Not on file    Her Allergies Are:  Allergies  Allergen Reactions  . Cymbalta [Duloxetine Hcl]     Nausea, vomiting  . Lyrica [Pregabalin] Nausea And Vomiting  . Gabapentin     Weight gain  :   Her Current Medications Are:  Outpatient Encounter Medications as of 06/05/2020  Medication Sig  . acetaminophen (TYLENOL 8 HOUR) 650 MG CR tablet Take 1 tablet (650 mg total) by mouth every 8 (eight) hours as needed.  Marland Kitchen acetaminophen-codeine (TYLENOL #3) 300-30 MG tablet Take 1 tablet by mouth daily as needed for moderate pain.  Marland Kitchen aspirin EC 81 MG tablet Take 81 mg by mouth daily. Swallow whole.  Marland Kitchen aspirin-acetaminophen-caffeine (EXCEDRIN MIGRAINE) 250-250-65 MG tablet Take 2 tablets by mouth every 6 (six) hours as needed for headache.  . Cyanocobalamin (VITAMIN B 12 PO) Take by mouth.  . diclofenac Sodium (VOLTAREN) 1 % GEL APPLY 2 GRAMS TOPICALLY FOUR TIMES DAILY  . Ferrous Sulfate (IRON PO) Take by mouth.   Marland Kitchen glucose blood (ONETOUCH VERIO) test strip Check blood sugar before and 2 hours after a meal to see how food affects blood sugars once a day  . Lancets (ONETOUCH DELICA PLUS 123XX123) MISC Check blood sugar before and 2 hours after a meal to see  how food affects blood sugars once a day  . lisinopril-hydrochlorothiazide (ZESTORETIC) 20-25 MG tablet Take 1 tablet by mouth daily.  Marland Kitchen MELATONIN PO Take 5 mg by mouth at bedtime.   . pantoprazole (PROTONIX) 40 MG tablet Take 1 tablet (40 mg total) by mouth 2 (two)  times daily.  . promethazine (PHENERGAN) 12.5 MG tablet Take 1 tablet (12.5 mg total) by mouth daily as needed for nausea or vomiting.  . simvastatin (ZOCOR) 40 MG tablet TAKE 1 TABLET (40 MG TOTAL) BY MOUTH AT BEDTIME.  Marland Kitchen lidocaine (LIDODERM) 5 % Place 1 patch onto the skin daily. Remove & Discard patch within 12 hours or as directed by MD (Patient not taking: No sig reported)  . [DISCONTINUED] albuterol (PROVENTIL HFA) 108 (90 Base) MCG/ACT inhaler Inhale 2 puffs into the lungs every 6 (six) hours as needed for wheezing or shortness of breath. (Patient not taking: Reported on 05/10/2020)  . [DISCONTINUED] cyclobenzaprine (FLEXERIL) 10 MG tablet TAKE 1 TABLET BY MOUTH TWO TIMES DAILY AS NEEDED FOR MUSCLE SPASMS  . [DISCONTINUED] promethazine (PHENERGAN) 12.5 MG tablet TAKE 1 TABLET BY MOUTH DAILY AS NEEDED FOR NAUSEA OR VOMITING.   No facility-administered encounter medications on file as of 06/05/2020.  :  Review of Systems:  Out of a complete 14 point review of systems, all are reviewed and negative with the exception of these symptoms as listed below: Review of Systems  Neurological:       Pt referred to [place] because she has having difficulty with getting restful sleep through the night. She had a sleep study completed > 10 yrs ago and started CPAP for OSA. States that she used that around 6 months and then stopped because she was unable to tolerate the CPAP. She states more recently she averages 3 hrs of sleep a night. She sleeps around 12-3 am and wakes up and unable to fall back asleep. This causes her to feel exhausted through out the day but when time to go to bed even feeling exhausted struggles with falling asleep. Snores in  sleep and has woke herself up trying to catch her breath. Epworth Sleepiness Scale 0= would never doze 1= slight chance of dozing 2= moderate chance of dozing 3= high chance of dozing  Sitting and reading:2 Watching TV:3 Sitting inactive in a public place (ex. Theater or meeting):1 As a passenger in a car for an hour without a break:2 Lying down to rest in the afternoon:2 Sitting and talking to someone:2 Sitting quietly after lunch (no alcohol):3 In a car, while stopped in traffic:1 Total:16     Objective:  Neurological Exam  Physical Exam Physical Examination:   Vitals:   06/05/20 0941  BP: 135/90  Pulse: 97    General Examination: The patient is a very pleasant 59 y.o. female in no acute distress. She appears well-developed and well-nourished and well groomed.   HEENT: Normocephalic, atraumatic, pupils are equal, round and reactive to light, extraocular tracking is good without limitation to gaze excursion or nystagmus noted. Hearing is grossly intact. Face is symmetric with normal facial animation. Speech is clear with no dysarthria noted. There is no hypophonia. There is no lip, neck/head, jaw or voice tremor. Neck is supple with full range of passive and active motion. There are no carotid bruits on auscultation. Oropharynx exam reveals: mild mouth dryness, good dental hygiene and moderate airway crowding, due to Small airway entry, larger uvula, tonsillar size of 1+ bilaterally, slightly wider tongue noted.  Tongue protrudes centrally and palate elevates symmetrically.  She has a moderate overbite.  Neck circumference of 16-1/8 inches.  Chest: Clear to auscultation without wheezing, rhonchi or crackles noted.  Heart: S1+S2+0, regular and normal without murmurs, rubs or gallops noted.   Abdomen: Soft, non-tender and non-distended with normal bowel sounds appreciated  on auscultation.  Extremities: There is no pitting edema in the distal lower extremities bilaterally.    Skin: Warm and dry without trophic changes noted.   Musculoskeletal: exam reveals no obvious joint deformities, tenderness or joint swelling or erythema.   Neurologically:  Mental status: The patient is awake, alert and oriented in all 4 spheres. Her immediate and remote memory, attention, language skills and fund of knowledge are appropriate. There is no evidence of aphasia, agnosia, apraxia or anomia. Speech is clear with normal prosody and enunciation. Thought process is linear. Mood is normal and affect is normal.  Cranial nerves II - XII are as described above under HEENT exam.  Motor exam: Normal bulk, strength and tone is noted. Fine motor skills and coordination: grossly intact.  Cerebellar testing: No dysmetria or intention tremor. There is no truncal or gait ataxia.  Sensory exam: intact to light touch in the upper and lower extremities.  Gait, station and balance: She stands easily. No veering to one side is noted. No leaning to one side is noted. Posture is age-appropriate and stance is narrow based. Gait shows normal stride length and normal pace. No problems turning are noted.   Assessment and Plan:  In summary, THUY ATILANO is a very pleasant 59 y.o.-year old female with an underlying medical history of hypertension, kidney stones, depression, anxiety, chronic pain, fibromyalgia, reflux disease, headaches and obesity, who presents for evaluation of her obstructive sleep apnea.  She was previously diagnosed some 15 years ago but had difficulty tolerating CPAP at the time.  She is willing to get rechecked and consider positive airway pressure treatment again.  I had a long chat with the patient about my findings and the diagnosis of OSA, its prognosis and treatment options. We talked about medical treatments, surgical interventions and non-pharmacological approaches. I explained in particular the risks and ramifications of untreated moderate to severe OSA, especially with respect  to developing cardiovascular disease down the Road, including congestive heart failure, difficult to treat hypertension, cardiac arrhythmias, or stroke. Even type 2 diabetes has, in part, been linked to untreated OSA. Symptoms of untreated OSA include daytime sleepiness, memory problems, mood irritability and mood disorder such as depression and anxiety, lack of energy, as well as recurrent headaches, especially morning headaches. We talked about trying to maintain a healthy lifestyle in general, as well as the importance of weight control. We also talked about the importance of good sleep hygiene. I recommended the following at this time: sleep study.  I explained the sleep test procedure to the patient and also outlined possible surgical and non-surgical treatment options of OSA, including the use of a custom-made dental device (which would require a referral to a specialist dentist or oral surgeon), upper airway surgical options, such as traditional UPPP or a novel less invasive surgical option in the form of Inspire hypoglossal nerve stimulation (which would involve a referral to an ENT surgeon). I also explained the CPAP treatment option to the patient, who indicated that she would be willing to try CPAP if the need arises. I explained the importance of being compliant with PAP treatment, not only for insurance purposes but primarily to improve Her symptoms, and for the patient's long term health benefit, including to reduce Her cardiovascular risks. I answered all her questions today and the patient was in agreement. I plan to see her back after the sleep study is completed and encouraged her to call with any interim questions, concerns, problems or updates.  Thank you very much for allowing me to participate in the care of this nice patient. If I can be of any further assistance to you please do not hesitate to call me at [phone].  Sincerely,   Star Age, MD, PhD

## 2020-06-05 NOTE — Patient Instructions (Signed)

## 2020-06-12 ENCOUNTER — Ambulatory Visit: Payer: 59 | Attending: Internal Medicine | Admitting: Physical Therapy

## 2020-06-13 ENCOUNTER — Ambulatory Visit: Payer: 59 | Admitting: Dietician

## 2020-06-14 ENCOUNTER — Ambulatory Visit: Payer: 59 | Admitting: Physical Therapy

## 2020-06-18 ENCOUNTER — Ambulatory Visit (INDEPENDENT_AMBULATORY_CARE_PROVIDER_SITE_OTHER): Payer: Self-pay | Admitting: Student

## 2020-06-18 ENCOUNTER — Other Ambulatory Visit: Payer: Self-pay

## 2020-06-18 ENCOUNTER — Ambulatory Visit: Payer: Self-pay | Admitting: Behavioral Health

## 2020-06-18 ENCOUNTER — Telehealth: Payer: Self-pay | Admitting: *Deleted

## 2020-06-18 DIAGNOSIS — R051 Acute cough: Secondary | ICD-10-CM | POA: Insufficient documentation

## 2020-06-18 DIAGNOSIS — E1165 Type 2 diabetes mellitus with hyperglycemia: Secondary | ICD-10-CM

## 2020-06-18 NOTE — Progress Notes (Signed)
  [facility] Internal Medicine Residency Telephone Encounter Continuity Care Appointment  HPI:   This telephone encounter was created for [name] on 06/18/2020 for the following purpose/cc cough x 1 week. Please see problem-based assessment/plan for full details.    Past Medical History:  Past Medical History:  Diagnosis Date  . Acid reflux disease   . Anginal pain ([place])    last time 04/18/13 in afternoon; referred to [name]  . Anxiety   . 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 Chest pain 04/07/2011  . Chronic pain   . Chronic pain 06/19/2014  . Cough 09/25/2011  . Depression   . Fast heart beat   . Fibromyalgia   . Headache(784.0) 12/27/2012  . History of blood transfusion 1981   childbirth  . History of kidney stones    passed  . Hypertension   . Influenza A 07/08/2017  . Left wrist pain 03/10/2013  . Migraine    last one was 04/18/13  . Sleep apnea    does not use CPAP  . Trichomonas contact, treated       Social Hx: Patient denies any sick contacts or anyone with hx of COVID-19.   ROS:   Positive for: productive cough, myalgias, insomnia, fatigue  Negative for: SOB, headache (resolved), fevers, chills, decreased appetite, nausea, vomiting, diarrhea   Assessment / Plan / Recommendations:   Please see A&P under problem oriented charting for assessment of the patient's acute and chronic medical conditions.   As always, pt is advised that if symptoms worsen or new symptoms arise, they should go to an urgent care facility or to to ER for further evaluation.   Consent and Medical Decision Making:   Patient discussed with Dr. Rebeca Alert  This is a telephone encounter between [place] on 06/18/2020 for acute cough. The visit was conducted with the patient located at home and Jeralyn Bennett at [facility]. The patient's identity was confirmed using their DOB and current address. The patient has consented to being evaluated  through a telephone encounter and understands the associated risks (an examination cannot be done and the patient may need to come in for an appointment) / benefits (allows the patient to remain at home, decreasing exposure to coronavirus). I personally spent 10 minutes on medical discussion.    Jeralyn Bennett, MD 06/18/2020, 12:13 PM Pager: [phone]

## 2020-06-18 NOTE — Assessment & Plan Note (Signed)
Patient states that she has not been monitoring her blood sugars during this acute illness period, although denies increased thirst, abdominal pain, nausea, vomiting.  - Continue current regimen

## 2020-06-18 NOTE — Assessment & Plan Note (Signed)
Patient endorses 1 week of productive cough and body aches with troubles sleeping and fatigue. She states she completed her COVID-19 vaccination series earlier last year and just got the COVID-19 booster shot about 2 weeks ago. After her shot, she experienced malaise, which resolved prior to onset of her current symptoms. States she received the influenza vaccine in October. Denies any sick contacts with COVID. Denies any fevers, chills, trouble with PO intake, and states myalgias are improving and headache has resolved. Has had some relief with Nyquil and Dayquil. Just got tested for COVID yesterday although test results are still pending.   - Encouraged patient to quarantine until test results return at the earliest  - Encouraged addition of NSAIDs and Guaifenesin PRN - Instructed patient to call clinic with results and with any gave return precautions to return to ED if severe SOB occurs or if she is unable to tolerate PO intake

## 2020-06-18 NOTE — Telephone Encounter (Signed)
Patient called in requesting cough medicine. States she has had a cough for more than a week, productive of yellow sputum. No relief with OTC cough meds. Had covid test yesterday and is awaiting results. Denies fever, chills at present but had them earlier. Denies SHOB. Tele appt given this AM with [facility]. Hubbard Hartshorn, BSN, RN-BC

## 2020-06-19 ENCOUNTER — Ambulatory Visit: Payer: 59 | Admitting: Physical Therapy

## 2020-06-19 ENCOUNTER — Other Ambulatory Visit: Payer: Self-pay

## 2020-06-19 ENCOUNTER — Ambulatory Visit: Payer: Self-pay | Admitting: Behavioral Health

## 2020-06-19 DIAGNOSIS — F430 Acute stress reaction: Secondary | ICD-10-CM

## 2020-06-19 NOTE — BH Specialist Note (Signed)
Integrated Behavioral Health via Telemedicine Visit  06/19/2020 Sherry [name] [id number]  Number of Integrated Behavioral Health visits: 3/6 Session Start time: 10:00am  Session End time: 10:40am Total time: 40  Referring Provider: [facility] Resident Patient/Family location: Pt at home [facility] Provider location: [facility] Office All persons participating in visit: Pt & Clinician Types of Service: Individual psychotherapy  I connected with Sherry [name] and/or Sherry [name]'s self by Telephone  (Video is Caregility application) and verified that I am speaking with the correct person using two identifiers.Discussed confidentiality: Yes   I discussed the limitations of telemedicine and the availability of in person appointments.  Discussed there is a possibility of technology failure and discussed alternative modes of communication if that failure occurs.  I discussed that engaging in this telemedicine visit, they consent to the provision of behavioral healthcare and the services will be billed under their insurance.  Patient and/or legal guardian expressed understanding and consented to Telemedicine visit: Yes   Presenting Concerns: Patient and/or family reports the following symptoms/concerns: concern for friend whose Husb died yesterday Duration of problem: several wks; Severity of problem: mild  Patient and/or Family's Strengths/Protective Factors: Social connections, Social and Emotional competence, Concrete supports in place (healthy food, safe environments, etc.) and Sense of purpose  Goals Addressed: Patient will: 1.  Reduce symptoms of: stress  2.  Increase knowledge and/or ability of: stress reduction  3.  Demonstrate ability to: Increase healthy adjustment to current life circumstances  Progress towards Goals: Ongoing  Interventions: Interventions utilized:  Supportive Counseling and Supportive Reflection Standardized Assessments completed: Not Needed  Patient  and/or Family Response: Pt responsive to supportive call from Clinician today. Pt is sick & waiting on results of COVID-19 test taken yesterday. Pt lost job last wknd & is trying to adjust to life circumstances. 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 Pt has Hx of CSA & desires for her ministry to show young women there is life beyond these experiences.   Pt shared her resiliency & hopeful spirit about her calling.   Assessment: Patient currently experiencing anxiety about possible COVID results. Pt has 2 grown children who live w/her & are caring for her while sick. 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 Pt misses her Bros that died, & she keeps him close in her heart as he influenced her ministry in supportive ways.  Patient may benefit from cont'd discussion of future, ministry, & memories of her own experiences in childhood.  Plan: 1. Follow up with behavioral health clinician on : 2 wks 2. Behavioral recommendations: care for self while sick, cont to formulate & articulate future plans 3. Referral(s): [place] (In Clinic)  I discussed the assessment and treatment plan with the patient and/or parent/guardian. They were provided an opportunity to ask questions and all were answered. They agreed with the plan and demonstrated an understanding of the instructions.   They were advised to call back or seek an in-person evaluation if the symptoms worsen or if the condition fails to improve as anticipated.  Sherry Hutching, LMFT

## 2020-06-20 ENCOUNTER — Other Ambulatory Visit: Payer: Self-pay | Admitting: Student

## 2020-06-20 DIAGNOSIS — K219 Gastro-esophageal reflux disease without esophagitis: Secondary | ICD-10-CM

## 2020-06-20 MED FILL — PROMETHAZINE 12.5 MG TABLET: 12.5 | 20 days supply | Qty: 20 | Fill #2

## 2020-06-20 MED FILL — CYCLOBENZAPRINE HCL 10 MG T: 10 | 15 days supply | Qty: 30 | Fill #5

## 2020-06-20 MED FILL — ONETOUCH VERIO TEST STRIP: 30 days supply | Qty: 100 | Fill #0

## 2020-06-20 MED FILL — ONETOUCH DELICA PLUS LANCET: 30 days supply | Qty: 100 | Fill #0

## 2020-06-20 NOTE — Progress Notes (Signed)
Internal Medicine Clinic Attending  Case discussed with Dr. Speakman at the time of the visit.  We reviewed the resident's history and exam and pertinent patient test results.  I agree with the assessment, diagnosis, and plan of care documented in the resident's note.  Yaniyah Koors, M.D., Ph.D.  

## 2020-06-21 ENCOUNTER — Ambulatory Visit: Payer: 59 | Admitting: Physical Therapy

## 2020-06-21 ENCOUNTER — Encounter: Payer: Self-pay | Attending: Physical Medicine and Rehabilitation | Admitting: Physical Medicine and Rehabilitation

## 2020-06-21 DIAGNOSIS — Z5181 Encounter for therapeutic drug level monitoring: Secondary | ICD-10-CM | POA: Insufficient documentation

## 2020-06-21 DIAGNOSIS — Z79891 Long term (current) use of opiate analgesic: Secondary | ICD-10-CM | POA: Insufficient documentation

## 2020-06-21 DIAGNOSIS — M797 Fibromyalgia: Secondary | ICD-10-CM | POA: Insufficient documentation

## 2020-06-21 DIAGNOSIS — G894 Chronic pain syndrome: Secondary | ICD-10-CM | POA: Insufficient documentation

## 2020-06-21 DIAGNOSIS — Z79899 Other long term (current) drug therapy: Secondary | ICD-10-CM | POA: Insufficient documentation

## 2020-06-21 DIAGNOSIS — M7918 Myalgia, other site: Secondary | ICD-10-CM | POA: Insufficient documentation

## 2020-07-03 ENCOUNTER — Telehealth: Payer: Self-pay | Admitting: Dietician

## 2020-07-03 ENCOUNTER — Other Ambulatory Visit: Payer: Self-pay

## 2020-07-03 ENCOUNTER — Other Ambulatory Visit: Payer: Self-pay | Admitting: Physical Medicine and Rehabilitation

## 2020-07-03 ENCOUNTER — Ambulatory Visit: Payer: Self-pay | Admitting: Behavioral Health

## 2020-07-03 DIAGNOSIS — F418 Other specified anxiety disorders: Secondary | ICD-10-CM

## 2020-07-03 MED FILL — LISINOPRIL-HCTZ 20-25 MG TA: 20-25 | 90 days supply | Qty: 90 | Fill #1

## 2020-07-03 MED FILL — ACETAMINOPHEN-COD #3 TABLET: 300-30 | 2 days supply | Qty: 2 | Fill #4

## 2020-07-03 MED FILL — CYCLOBENZAPRINE HCL 10 MG T: 10 | 15 days supply | Qty: 30 | Fill #5

## 2020-07-03 MED FILL — ONETOUCH DELICA PLUS LANCET: 30 days supply | Qty: 100 | Fill #0

## 2020-07-03 MED FILL — PROMETHAZINE 12.5 MG TABLET: 12.5 | 20 days supply | Qty: 20 | Fill #2
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
## 2020-07-04 ENCOUNTER — Other Ambulatory Visit: Payer: Self-pay | Admitting: Student

## 2020-07-04 ENCOUNTER — Other Ambulatory Visit: Payer: Self-pay | Admitting: Internal Medicine

## 2020-07-04 DIAGNOSIS — K219 Gastro-esophageal reflux disease without esophagitis: Secondary | ICD-10-CM

## 2020-07-04 MED FILL — PANTOPRAZOLE SOD DR 40 MG T: 40 | 30 days supply | Qty: 60 | Fill #0

## 2020-07-05 NOTE — Telephone Encounter (Signed)
Called Ms. Goodreau per request of Dr. Theodis Shove. I left a voicemail for her to return my call.

## 2020-07-08 ENCOUNTER — Encounter: Payer: Self-pay | Admitting: Dietician

## 2020-07-08 NOTE — Telephone Encounter (Signed)
Left voicemail for return call.  Butch Penny Britian Jentz, RD 07/08/2020 10:01 AM.

## 2020-07-09 NOTE — Telephone Encounter (Signed)
No. I am letting youi know that I unsuccessfully tried to get in touch with her per your request.

## 2020-07-09 NOTE — Telephone Encounter (Signed)
Sherry [name]-  Are you routing this to me bc she lft VM on wrong phone extension?  Thx-

## 2020-07-15 ENCOUNTER — Ambulatory Visit: Payer: 59 | Admitting: Internal Medicine

## 2020-07-15 NOTE — Addendum Note (Signed)
Addended by: Hulan Fray on: 07/15/2020 05:28 PM   Modules accepted: Orders

## 2020-07-17 ENCOUNTER — Other Ambulatory Visit: Payer: Self-pay

## 2020-07-17 ENCOUNTER — Ambulatory Visit: Payer: Self-pay | Admitting: Behavioral Health

## 2020-07-17 ENCOUNTER — Telehealth: Payer: Self-pay | Admitting: Behavioral Health

## 2020-07-17 NOTE — Telephone Encounter (Signed)
Contacted Pt several times for session today. Lft msg both times to return call or call [facility] to r/s.  Dr. Theodis Shove

## 2020-07-31 ENCOUNTER — Other Ambulatory Visit: Payer: Self-pay

## 2020-07-31 ENCOUNTER — Telehealth: Payer: Self-pay | Admitting: Behavioral Health

## 2020-07-31 ENCOUNTER — Encounter: Payer: 59 | Admitting: Behavioral Health

## 2020-07-31 NOTE — Telephone Encounter (Signed)
Contacted Pt twice for 1:30pm appt. Lft msg for Pt to r/s prn.  Dr. Theodis Shove

## 2020-08-12 ENCOUNTER — Other Ambulatory Visit: Payer: Self-pay | Admitting: Internal Medicine

## 2020-08-12 ENCOUNTER — Other Ambulatory Visit: Payer: Self-pay | Admitting: Student

## 2020-08-12 DIAGNOSIS — M797 Fibromyalgia: Secondary | ICD-10-CM

## 2020-08-14 ENCOUNTER — Other Ambulatory Visit: Payer: Self-pay | Admitting: Internal Medicine

## 2020-08-14 ENCOUNTER — Other Ambulatory Visit: Payer: Self-pay

## 2020-08-14 DIAGNOSIS — M797 Fibromyalgia: Secondary | ICD-10-CM

## 2020-08-14 DIAGNOSIS — E7800 Pure hypercholesterolemia, unspecified: Secondary | ICD-10-CM

## 2020-08-14 MED FILL — PROMETHAZINE 12.5 MG TABLET: 12.5 | 20 days supply | Qty: 20 | Fill #0

## 2020-08-14 NOTE — Telephone Encounter (Signed)
Appt has been sch for 08/15/2020 @ 1:15 pm.

## 2020-08-14 NOTE — Telephone Encounter (Signed)
This patient is overdue for a clinic visit. This prescription can be filled when she follows up in person.

## 2020-08-15 ENCOUNTER — Encounter: Payer: 59 | Admitting: Student
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
## 2020-08-20 ENCOUNTER — Other Ambulatory Visit: Payer: Self-pay | Admitting: Student

## 2020-08-20 ENCOUNTER — Telehealth: Payer: Self-pay

## 2020-08-20 ENCOUNTER — Ambulatory Visit: Payer: 59 | Admitting: Student

## 2020-08-20 ENCOUNTER — Other Ambulatory Visit: Payer: Self-pay | Admitting: Dietician

## 2020-08-20 ENCOUNTER — Encounter: Payer: Self-pay | Admitting: Student

## 2020-08-20 ENCOUNTER — Other Ambulatory Visit: Payer: Self-pay

## 2020-08-20 VITALS — BP 135/83 | HR 85 | Temp 98.0°F | Ht 60.0 in | Wt 176.0 lb

## 2020-08-20 DIAGNOSIS — G4733 Obstructive sleep apnea (adult) (pediatric): Secondary | ICD-10-CM | POA: Diagnosis not present

## 2020-08-20 DIAGNOSIS — K219 Gastro-esophageal reflux disease without esophagitis: Secondary | ICD-10-CM

## 2020-08-20 DIAGNOSIS — E1165 Type 2 diabetes mellitus with hyperglycemia: Secondary | ICD-10-CM

## 2020-08-20 DIAGNOSIS — M797 Fibromyalgia: Secondary | ICD-10-CM

## 2020-08-20 DIAGNOSIS — I10 Essential (primary) hypertension: Secondary | ICD-10-CM | POA: Diagnosis not present

## 2020-08-20 DIAGNOSIS — E042 Nontoxic multinodular goiter: Secondary | ICD-10-CM

## 2020-08-20 DIAGNOSIS — F430 Acute stress reaction: Secondary | ICD-10-CM

## 2020-08-20 DIAGNOSIS — E7800 Pure hypercholesterolemia, unspecified: Secondary | ICD-10-CM

## 2020-08-20 LAB — POCT GLYCOSYLATED HEMOGLOBIN (HGB A1C): Hemoglobin A1C: 7.2 % — AB (ref 4.0–5.6)

## 2020-08-20 LAB — GLUCOSE, CAPILLARY: Glucose-Capillary: 143 mg/dL — ABNORMAL HIGH (ref 70–99)
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
## 2020-08-20 NOTE — Assessment & Plan Note (Signed)
Patient reports ongoing frequent nighttime awakenings, snoring, and daytime drowsiness. See 04/22/20 note for additional history. She was seen by neurology sleep medicine specialist Dr. Rexene Alberts who referred her for a sleep study which was originally scheduled for 07/01/20, however patient reports she was diagnosed with COVID around that time and missed that and many other appointments.  Plan: - Reached out to [facility] to reschedule patient's sleep study; left VM today, will call back tomorrow

## 2020-08-20 NOTE — Telephone Encounter (Signed)
Pls contact pt regarding medicine, she forgot to ask physician on visit [phone]

## 2020-08-20 NOTE — Progress Notes (Signed)
Referred back for continued diabetes education

## 2020-08-20 NOTE — Assessment & Plan Note (Signed)
New diagnosis of diabetes with A1c of 7.4% on last visit on 04/22/20. Hemoglobin A1c 7.2% today. During her last visit, patient expressed that she would prefer lifestyle modifications as opposed to medication, and she was referred to [facility]. However, she missed this appointment secondary to a reported COVID diagnosis.   Plan: - re-referral to [facility], diabetes educator
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
## 2020-08-20 NOTE — Assessment & Plan Note (Addendum)
BP 135/83 today. On lisinopril-HCTZ 20-25 mg daily. Not monitoring her BP at home.   A/P: Though patient is just above goal of <135/83, given the numerous other problems addressed at today's visit, will not make changes to her regimen today. - 98-month follow-up - As per sleep apnea problem-based assessment and plan, working on getting patient rescheduled for sleep study

## 2020-08-20 NOTE — Telephone Encounter (Signed)
Return pt's call - her daughter stated she unavailable but will have her to give our office a call back.

## 2020-08-20 NOTE — Assessment & Plan Note (Signed)
Patient reports ongoing reflux symptoms. Continues to deny dysphagia, odynophagia, blood, melena, anorexia, or weight loss. She states she attempts to avoid her known triggers such as spicy or acidic foods. Was advised at her last visit on 04/22/20 to increase her Protonix to BID dosing, however she didn't realize she was supposed to do this. She missed her previous GI referral due to a reported COVID diagnosis.  Plan: - Increase Protonix to twice daily - Will re-refer patient to GI if medical management does not improve her symptoms - 66-month follow-up
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
## 2020-08-21 ENCOUNTER — Other Ambulatory Visit: Payer: Self-pay | Admitting: Internal Medicine

## 2020-08-21 ENCOUNTER — Other Ambulatory Visit: Payer: Self-pay | Admitting: *Deleted

## 2020-08-21 DIAGNOSIS — E7800 Pure hypercholesterolemia, unspecified: Secondary | ICD-10-CM

## 2020-08-21 LAB — HM DIABETES EYE EXAM

## 2020-08-21 MED ORDER — SIMVASTATIN 40 MG PO TABS: 40.0000 mg | ORAL_TABLET | Freq: Every day | ORAL | 1 refills | Status: DC

## 2020-08-21 NOTE — Progress Notes (Signed)
Internal Medicine Clinic Attending  Case discussed with Dr. Shon Baton  At the time of the visit.  We reviewed the resident's history and exam and pertinent patient test results.  I agree with the assessment, diagnosis, and plan of care documented in the resident's note.  Sherry [name] is living with many ongoing conditions, some in process of ongoing evaluation.  We discussed today prioritization of timing of pending studies and f/u and I agree with plan as outlined. Management of anxiety is a priority as this will aid in management of her other conditions.

## 2020-08-22 ENCOUNTER — Encounter: Payer: Self-pay | Admitting: *Deleted

## 2020-08-22 ENCOUNTER — Other Ambulatory Visit: Payer: Self-pay | Admitting: Student

## 2020-09-07 ENCOUNTER — Other Ambulatory Visit (HOSPITAL_COMMUNITY): Payer: Self-pay

## 2020-09-09 ENCOUNTER — Ambulatory Visit: Payer: 59 | Admitting: Neurology

## 2020-09-09 DIAGNOSIS — R519 Headache, unspecified: Secondary | ICD-10-CM

## 2020-09-09 DIAGNOSIS — G4719 Other hypersomnia: Secondary | ICD-10-CM

## 2020-09-09 DIAGNOSIS — E669 Obesity, unspecified: Secondary | ICD-10-CM

## 2020-09-09 DIAGNOSIS — G4733 Obstructive sleep apnea (adult) (pediatric): Secondary | ICD-10-CM

## 2020-09-11 ENCOUNTER — Encounter: Payer: 59 | Admitting: Dietician

## 2020-09-17 ENCOUNTER — Other Ambulatory Visit (HOSPITAL_COMMUNITY): Payer: Self-pay

## 2020-09-17 MED FILL — Promethazine HCl Tab 12.5 MG: ORAL | 20 days supply | Qty: 20 | Fill #0 | Status: CN

## 2020-09-17 MED FILL — Pantoprazole Sodium EC Tab 40 MG (Base Equiv): ORAL | 90 days supply | Qty: 180 | Fill #0 | Status: CN

## 2020-09-18 ENCOUNTER — Other Ambulatory Visit (HOSPITAL_COMMUNITY): Payer: Self-pay

## 2020-09-18 ENCOUNTER — Other Ambulatory Visit: Payer: Self-pay | Admitting: Student

## 2020-09-18 DIAGNOSIS — M797 Fibromyalgia: Secondary | ICD-10-CM

## 2020-09-18 MED ORDER — DICLOFENAC SODIUM 1 % EX GEL
2.0000 g | Freq: Four times a day (QID) | CUTANEOUS | 0 refills | Status: DC
  Filled 2020-09-18: qty 100, 10d supply, fill #0

## 2020-09-18 MED FILL — Promethazine HCl Tab 12.5 MG: ORAL | 20 days supply | Qty: 20 | Fill #0 | Status: AC

## 2020-09-18 MED FILL — Pantoprazole Sodium EC Tab 40 MG (Base Equiv): ORAL | 90 days supply | Qty: 180 | Fill #0 | Status: AC

## 2020-09-19 ENCOUNTER — Other Ambulatory Visit (HOSPITAL_COMMUNITY): Payer: Self-pay

## 2020-09-19 ENCOUNTER — Encounter: Payer: 59 | Admitting: Dietician

## 2020-09-19 ENCOUNTER — Telehealth: Payer: Self-pay | Admitting: Dietician

## 2020-09-19 NOTE — Telephone Encounter (Signed)
Sherry [name] cancelled today's appointment and agreed to reschedule in the same day she sees a doctor. I reminded hr that she is due to see a doctor in June.

## 2020-09-20 ENCOUNTER — Other Ambulatory Visit: Payer: Self-pay | Admitting: Student

## 2020-09-20 ENCOUNTER — Other Ambulatory Visit (HOSPITAL_COMMUNITY): Payer: Self-pay

## 2020-09-20 DIAGNOSIS — M797 Fibromyalgia: Secondary | ICD-10-CM

## 2020-09-23 ENCOUNTER — Telehealth: Payer: Self-pay | Admitting: Emergency Medicine

## 2020-09-23 ENCOUNTER — Other Ambulatory Visit (HOSPITAL_COMMUNITY): Payer: Self-pay

## 2020-09-23 MED ORDER — CYCLOBENZAPRINE HCL 10 MG PO TABS
10.0000 mg | ORAL_TABLET | Freq: Two times a day (BID) | ORAL | 0 refills | Status: DC
  Filled 2020-09-23: qty 30, 15d supply, fill #0

## 2020-09-23 NOTE — Telephone Encounter (Signed)
Sherry S. From [facility] with approval for sleep study, approval dates 09/18/2020 - 11/04/2020,   Case [id number]

## 2020-10-16 NOTE — Addendum Note (Signed)
Addended by: Hulan Fray on: 10/16/2020 04:37 PM   Modules accepted: Orders

## 2020-10-18 ENCOUNTER — Other Ambulatory Visit: Payer: Self-pay | Admitting: Student

## 2020-10-18 ENCOUNTER — Other Ambulatory Visit: Payer: Self-pay | Admitting: Internal Medicine

## 2020-10-18 ENCOUNTER — Other Ambulatory Visit (HOSPITAL_COMMUNITY): Payer: Self-pay

## 2020-10-18 DIAGNOSIS — M797 Fibromyalgia: Secondary | ICD-10-CM

## 2020-10-18 MED FILL — Promethazine HCl Tab 12.5 MG: ORAL | 20 days supply | Qty: 20 | Fill #1 | Status: CN

## 2020-10-19 ENCOUNTER — Other Ambulatory Visit (HOSPITAL_COMMUNITY): Payer: Self-pay

## 2020-10-21 ENCOUNTER — Other Ambulatory Visit (HOSPITAL_COMMUNITY): Payer: Self-pay

## 2020-10-21 MED ORDER — DICLOFENAC SODIUM 1 % EX GEL
2.0000 g | Freq: Four times a day (QID) | CUTANEOUS | 0 refills | Status: DC
Start: 2020-10-21 — End: 2020-12-19
  Filled 2020-10-21: qty 100, 13d supply, fill #0
  Filled 2020-11-14: qty 100, 10d supply, fill #0

## 2020-10-22 ENCOUNTER — Other Ambulatory Visit (HOSPITAL_COMMUNITY): Payer: Self-pay

## 2020-10-28 ENCOUNTER — Other Ambulatory Visit (HOSPITAL_COMMUNITY): Payer: Self-pay

## 2020-10-29 ENCOUNTER — Other Ambulatory Visit (HOSPITAL_COMMUNITY): Payer: Self-pay

## 2020-10-29 ENCOUNTER — Encounter: Payer: 59 | Admitting: Internal Medicine

## 2020-11-02 ENCOUNTER — Encounter: Payer: Self-pay | Admitting: *Deleted

## 2020-11-07 ENCOUNTER — Encounter: Payer: 59 | Admitting: Internal Medicine

## 2020-11-09 ENCOUNTER — Other Ambulatory Visit: Payer: Self-pay

## 2020-11-09 ENCOUNTER — Encounter (HOSPITAL_COMMUNITY): Payer: Self-pay | Admitting: *Deleted

## 2020-11-09 ENCOUNTER — Ambulatory Visit (HOSPITAL_COMMUNITY)
Admission: EM | Admit: 2020-11-09 | Discharge: 2020-11-09 | Disposition: A | Discharge status: Home / Self Care | Payer: 59 | Attending: Physician Assistant | Admitting: Physician Assistant

## 2020-11-09 DIAGNOSIS — J3489 Other specified disorders of nose and nasal sinuses: Secondary | ICD-10-CM | POA: Diagnosis not present

## 2020-11-09 DIAGNOSIS — J0140 Acute pansinusitis, unspecified: Secondary | ICD-10-CM | POA: Diagnosis not present

## 2020-11-09 DIAGNOSIS — R0981 Nasal congestion: Secondary | ICD-10-CM

## 2020-11-09 MED ORDER — AMOXICILLIN-POT CLAVULANATE 875-125 MG PO TABS: 1.0000 | ORAL_TABLET | Freq: Two times a day (BID) | ORAL | 0 refills | Status: DC

## 2020-11-09 NOTE — Discharge Instructions (Signed)
Take Augmentin twice a day for 7 days to treat sinus infection.  I also recommend that you use Mucinex and Flonase for additional symptom relief.  Make sure you are drinking plenty of fluid.  If you have persistent or worsening symptoms please return for reevaluation.

## 2020-11-09 NOTE — ED Provider Notes (Signed)
[place]    CSN: [id number] Arrival date & time: 11/09/20  1003      History   Chief Complaint Chief Complaint  Patient presents with  . Cough  . Headache  . Sore Throat  . Otalgia    HPI Sherry [name] is a 60 y.o. female.   Patient presents today with a weeklong history of nasal congestion.  Reports associated sinus pressure, headache, cough, fatigue.  Denies any fever, chest pain, shortness of breath.  She has tried NyQuil and Alka-Seltzer plus without improvement of symptoms.  Denies any known sick contacts.  Reports she is up-to-date on flu and COVID-19 immunizations.  She denies history of allergies, asthma, smoking, COPD.  She denies any recent antibiotic use.  She does have a history of sinus infections and states current symptoms are similar to previous episodes of this condition.  She has been able to perform daily activities including attend work despite symptoms.     Past Medical History:  Diagnosis Date  . Acid reflux disease   . Anginal pain ([place])    last time 04/18/13 in afternoon; referred to [name]  . Anxiety   . Cancer [facility])    uterine- had hysterectomy  . Chest pain 04/07/2011  . Chronic pain   . Chronic pain 06/19/2014  . Cough 09/25/2011  . Depression   . Fast heart beat   . Fibromyalgia   . Headache(784.0) 12/27/2012  . History of blood transfusion 1981   childbirth  . History of kidney stones    passed  . Hypertension   . Influenza A 07/08/2017  . Left wrist pain 03/10/2013  . Migraine    last one was 04/18/13  . Sleep apnea    does not use CPAP  . Trichomonas contact, treated     Patient Active Problem List   Diagnosis Date Noted  . Multiple thyroid nodules 08/20/2020  . Acute cough 06/18/2020  . Concussion 05/14/2020  . Acute stress disorder 05/14/2020  . Diabetes type 2, uncontrolled ([place]) 04/22/2020  . COVID-19 virus infection 07/18/2019  . Screening breast examination 01/26/2019  . Cervical cancer  screening 12/29/2018  . Insomnia 12/29/2018  . Colon cancer screening 08/03/2018  . Viral infection of lower respiratory system 08/03/2018  . Breast cancer screening 08/03/2018  . Solitary pulmonary nodule 08/03/2018  . Anemia 06/20/2018  . Depression 05/09/2018  . Sleep apnea 05/18/2013  . Calcaneus fracture, left 05/16/2013  . Lumbar facet arthropathy 03/10/2013  . Foot pain 07/07/2012  . High cholesterol 09/10/2011  . Metabolic syndrome 71/24/5809  . Obesity 09/02/2011  . Chest pain 04/07/2011  . Fibromyalgia 03/24/2011  . Anxiety 03/24/2011  . Sleep disorder 03/24/2011  . Hypertension 03/24/2011  . GERD 01/05/2007    Past Surgical History:  Procedure Laterality Date  . CHOLECYSTECTOMY     2008  . ORIF CALCANEOUS FRACTURE Left 05/16/2013   Procedure: OPEN REDUCTION INTERNAL FIXATION (ORIF) LEFT CALCANEOUS FRACTURE;  Surgeon: Rozanna Box, MD;  Location: [facility];  Service: Orthopedics;  Laterality: Left;  . TOTAL ABDOMINAL HYSTERECTOMY     1986  . WISDOM TOOTH EXTRACTION      OB History    Gravida  3   Para  2   Term  2   Preterm      AB  1   Living  2     SAB  1   IAB      Ectopic      Multiple  Live Births  2            Home Medications    Prior to Admission medications   Medication Sig Start Date End Date Taking? Authorizing Provider  amoxicillin-clavulanate (AUGMENTIN) 875-125 MG tablet Take 1 tablet by mouth every 12 (twelve) hours. 11/09/20  Yes Shakura Cowing, Derry Skill, PA-C  acetaminophen (TYLENOL 8 HOUR) 650 MG CR tablet Take 1 tablet (650 mg total) by mouth every 8 (eight) hours as needed. 07/18/18   Varney Biles, MD  aspirin EC 81 MG tablet Take 81 mg by mouth daily. Swallow whole.    [provider]  aspirin-acetaminophen-caffeine (EXCEDRIN MIGRAINE) [id number] MG tablet Take 2 tablets by mouth every 6 (six) hours as needed for headache.    [provider]  Cyanocobalamin (VITAMIN B 12 PO) Take by mouth.    [provider]  cyclobenzaprine (FLEXERIL) 10 MG tablet TAKE 1 TABLET BY MOUTH TWO TIMES DAILY AS NEEDED FOR MUSCLE SPASMS 09/23/20   Iona Beard, MD  diclofenac Sodium (VOLTAREN) 1 % GEL APPLY 2 GRAMS TOPICALLY FOUR TIMES DAILY 02/06/20   Madalyn Rob, MD  diclofenac Sodium (VOLTAREN) 1 % GEL Apply 2 g topically 4 (four) times daily. 10/21/20   Madalyn Rob, MD  Ferrous Sulfate (IRON PO) Take by mouth.     [provider]  glucose blood (ONETOUCH VERIO) test strip Check blood sugar before and 2 hours after a meal to see how food affects blood sugars once a day 05/16/20   Christian, Rylee, MD  Lancets (ONETOUCH DELICA PLUS [id number]) MISC USE 1 TO CHECK BLOOD GLUCOSE BEFORE AND 2 HOURS AFTER A MEAL TO SEE HOW FOOD AFFECTS BLOOD SUGAR ONCE DAILY. 05/16/20 05/16/21  Mitzi Hansen, MD  lidocaine (LIDODERM) 5 % Place 1 patch onto the skin daily. Remove & Discard patch within 12 hours or as directed by MD Patient not taking: No sig reported 04/17/20   Raulkar, Clide Deutscher, MD  lisinopril-hydrochlorothiazide (ZESTORETIC) 20-25 MG tablet TAKE 1 TABLET BY MOUTH DAILY. 04/12/20 04/12/21  Andrew Au, MD  MELATONIN PO Take 5 mg by mouth at bedtime.     [provider]  pantoprazole (PROTONIX) 40 MG tablet TAKE 1 TABLET BY MOUTH TWICE DAILY. 07/04/20   [place], Carley D, DO  promethazine (PHENERGAN) 12.5 MG tablet TAKE 1 TABLET (12.5 MG TOTAL) BY MOUTH DAILY AS NEEDED FOR NAUSEA OR VOMITING. 08/14/20 08/14/21  Mitzi Hansen, MD  simvastatin (ZOCOR) 40 MG tablet TAKE 1 TABLET (40 MG TOTAL) BY MOUTH AT BEDTIME. 08/21/20 08/21/21  Mitzi Hansen, MD    Family History Family History  Problem Relation Age of Onset  . Diabetes Mother   . Hypertension Mother   . Depression Mother   . Diabetes Sister   . Hypertension Father   . Cancer Father        Prostate  . Alcohol abuse Father   . Cancer Brother        Prostate  . Breast cancer Neg Hx   . Colon cancer Neg Hx   . Colon polyps Neg Hx    . Esophageal cancer Neg Hx   . Stomach cancer Neg Hx   . Rectal cancer Neg Hx     Social History Social History   Tobacco Use  . Smoking status: Never Smoker  . Smokeless tobacco: Never Used  Vaping Use  . Vaping Use: Never used  Substance Use Topics  . Alcohol use: No  . Drug use: No  Allergies   Cymbalta [duloxetine hcl], Lyrica [pregabalin], and Gabapentin   Review of Systems Review of Systems  Constitutional: Positive for activity change and fatigue. Negative for appetite change and fever.  HENT: Positive for congestion, postnasal drip, sinus pressure and sore throat. Negative for sneezing.   Respiratory: Positive for cough. Negative for shortness of breath.   Cardiovascular: Negative for chest pain.  Gastrointestinal: Positive for nausea (chronic). Negative for abdominal pain, diarrhea and vomiting.  Musculoskeletal: Negative for arthralgias and myalgias.  Neurological: Positive for headaches. Negative for dizziness and light-headedness.     Physical Exam Triage Vital Signs ED Triage Vitals  Enc Vitals Group     BP 11/09/20 1023 132/83     Pulse Rate 11/09/20 1023 (!) 110     Resp 11/09/20 1023 18     Temp 11/09/20 1023 99.2 F (37.3 C)     Temp src --      SpO2 11/09/20 1023 96 %     Weight --      Height --      Head Circumference --      Peak Flow --      Pain Score 11/09/20 1020 7     Pain Loc --      Pain Edu? --      Excl. in [place]? --    No data found.  Updated Vital Signs BP 132/83   Pulse (!) 110   Temp 99.2 F (37.3 C)   Resp 18   SpO2 96%   Visual Acuity Right Eye Distance:   Left Eye Distance:   Bilateral Distance:    Right Eye Near:   Left Eye Near:    Bilateral Near:     Physical Exam Vitals reviewed.  Constitutional:      General: She is awake. She is not in acute distress.    Appearance: Normal appearance. She is overweight. She is not ill-appearing.     Comments: Very pleasant female appears stated age in no acute  distress  HENT:     Head: Normocephalic and atraumatic.     Right Ear: Ear canal and external ear normal. A middle ear effusion is present. Tympanic membrane is not erythematous or bulging.     Left Ear: Ear canal and external ear normal. A middle ear effusion is present. Tympanic membrane is not erythematous or bulging.     Nose:     Right Sinus: Maxillary sinus tenderness and frontal sinus tenderness present.     Left Sinus: Maxillary sinus tenderness present. No frontal sinus tenderness.     Mouth/Throat:     Pharynx: Uvula midline. No oropharyngeal exudate or posterior oropharyngeal erythema.     Comments: Drainage in posterior oropharynx Cardiovascular:     Rate and Rhythm: Normal rate and regular rhythm.     Heart sounds: Normal heart sounds. No murmur heard.   Pulmonary:     Effort: Pulmonary effort is normal.     Breath sounds: Normal breath sounds. No wheezing, rhonchi or rales.     Comments: Clear to auscultation bilaterally Lymphadenopathy:     Head:     Right side of head: No submental, submandibular or tonsillar adenopathy.     Left side of head: No submental, submandibular or tonsillar adenopathy.     Cervical: No cervical adenopathy.  Psychiatric:        Behavior: Behavior is cooperative.      UC Treatments / Results  Labs (all labs ordered are listed, but only  abnormal results are displayed) Labs Reviewed - No data to display  EKG   Radiology No results found.  Procedures Procedures (including critical care time)  Medications Ordered in UC Medications - No data to display  Initial Impression / Assessment and Plan / UC Course  I have reviewed the triage vital signs and the nursing notes.  Pertinent labs & imaging results that were available during my care of the patient were reviewed by me and considered in my medical decision making (see chart for details).     Patient started on Augmentin given prolonged and worsening symptoms.  No indication  for influenza or COVID-19 testing given patient has been symptomatic for a week and this would not change management.  She was encouraged to use over-the-counter medications including Mucinex and Flonase for symptom management.  Recommended she rest and drink plenty of fluid.  Discussed alarm symptoms that warrant emergent evaluation.  Strict return precautions given to which patient expressed understanding.  Final Clinical Impressions(s) / UC Diagnoses   Final diagnoses:  Acute non-recurrent pansinusitis  Nasal congestion  Sinus pressure     Discharge Instructions     Take Augmentin twice a day for 7 days to treat sinus infection.  I also recommend that you use Mucinex and Flonase for additional symptom relief.  Make sure you are drinking plenty of fluid.  If you have persistent or worsening symptoms please return for reevaluation.    ED Prescriptions    Medication Sig Dispense Auth. Provider   amoxicillin-clavulanate (AUGMENTIN) 875-125 MG tablet Take 1 tablet by mouth every 12 (twelve) hours. 14 tablet Maia Handa, Derry Skill, PA-C     PDMP not reviewed this encounter.   Terrilee Croak, PA-C 11/09/20 1116

## 2020-11-09 NOTE — ED Triage Notes (Signed)
Pt reports Sx's started Sunday.

## 2020-11-14 ENCOUNTER — Other Ambulatory Visit (HOSPITAL_COMMUNITY): Payer: Self-pay

## 2020-11-14 ENCOUNTER — Other Ambulatory Visit: Payer: Self-pay | Admitting: Student

## 2020-11-14 DIAGNOSIS — M797 Fibromyalgia: Secondary | ICD-10-CM

## 2020-11-14 MED FILL — Promethazine HCl Tab 12.5 MG: ORAL | 20 days supply | Qty: 20 | Fill #1 | Status: AC

## 2020-11-15 ENCOUNTER — Other Ambulatory Visit (HOSPITAL_COMMUNITY): Payer: Self-pay

## 2020-11-20 ENCOUNTER — Ambulatory Visit (INDEPENDENT_AMBULATORY_CARE_PROVIDER_SITE_OTHER): Payer: 59 | Admitting: Student

## 2020-11-20 ENCOUNTER — Other Ambulatory Visit (HOSPITAL_COMMUNITY): Payer: Self-pay

## 2020-11-20 VITALS — BP 139/78 | HR 86 | Temp 98.5°F | Ht 60.0 in | Wt 180.2 lb

## 2020-11-20 DIAGNOSIS — E041 Nontoxic single thyroid nodule: Secondary | ICD-10-CM

## 2020-11-20 DIAGNOSIS — M25562 Pain in left knee: Secondary | ICD-10-CM

## 2020-11-20 DIAGNOSIS — E042 Nontoxic multinodular goiter: Secondary | ICD-10-CM

## 2020-11-20 DIAGNOSIS — I10 Essential (primary) hypertension: Secondary | ICD-10-CM

## 2020-11-20 DIAGNOSIS — E1165 Type 2 diabetes mellitus with hyperglycemia: Secondary | ICD-10-CM

## 2020-11-20 DIAGNOSIS — M797 Fibromyalgia: Secondary | ICD-10-CM

## 2020-11-20 DIAGNOSIS — B379 Candidiasis, unspecified: Secondary | ICD-10-CM

## 2020-11-20 DIAGNOSIS — T3695XA Adverse effect of unspecified systemic antibiotic, initial encounter: Secondary | ICD-10-CM

## 2020-11-20 DIAGNOSIS — G4733 Obstructive sleep apnea (adult) (pediatric): Secondary | ICD-10-CM

## 2020-11-20 LAB — POCT GLYCOSYLATED HEMOGLOBIN (HGB A1C): Hemoglobin A1C: 7.3 % — AB (ref 4.0–5.6)

## 2020-11-20 LAB — GLUCOSE, CAPILLARY: Glucose-Capillary: 239 mg/dL — ABNORMAL HIGH (ref 70–99)

## 2020-11-20 MED ORDER — CYCLOBENZAPRINE HCL 10 MG PO TABS
10.0000 mg | ORAL_TABLET | Freq: Two times a day (BID) | ORAL | 0 refills | Status: DC | PRN
  Filled 2020-11-20: qty 30, 15d supply, fill #0

## 2020-11-20 MED ORDER — FLUCONAZOLE 150 MG PO TABS
150.0000 mg | ORAL_TABLET | Freq: Every day | ORAL | 0 refills | Status: AC
  Filled 2020-11-20: qty 2, 3d supply, fill #0

## 2020-11-20 NOTE — Progress Notes (Signed)
Office Visit   Patient ID: Sherry [name], female    DOB: 12-24-1960, 60 y.o.   MRN: [id number]   PCP: Alexandria Lodge, MD   Subjective:   CC: vaginal itching, fibromyalgia, HTN, DM, knee pain  HPI:  [name] is a 60 y.o. woman with history as below who presents to clinic for the above chief complaints. Her last clinic visit was on 08/20/20.   To see the details of this patient's management of their acute and chronic problems, please refer to the Assessment & Plan under the Encounters tab.    Review of Systems:   Review of Systems  Eyes:  Negative for blurred vision.  Respiratory:  Negative for shortness of breath.   Cardiovascular:  Negative for chest pain.  Genitourinary:  Negative for dysuria, frequency and urgency.  Musculoskeletal:  Positive for joint pain. Negative for myalgias.  Neurological:  Negative for dizziness, weakness and headaches.   Past Medical History:  Diagnosis Date   Acid reflux disease    Anginal pain ([place])    last time 04/18/13 in afternoon; referred to [name]   Anxiety    Cancer [facility])    uterine- had hysterectomy   Chest pain 04/07/2011   Chronic pain    Chronic pain 06/19/2014   Cough 09/25/2011   Depression    Fast heart beat    Fibromyalgia    Headache(784.0) 12/27/2012   History of blood transfusion 1981   childbirth   History of kidney stones    passed   Hypertension    Influenza A 07/08/2017   Left wrist pain 03/10/2013   Migraine    last one was 04/18/13   Sleep apnea    does not use CPAP   Trichomonas contact, treated       ACTIVE MEDICATIONS   Outpatient Medications Prior to Visit  Medication Sig Dispense Refill   acetaminophen (TYLENOL 8 HOUR) 650 MG CR tablet Take 1 tablet (650 mg total) by mouth every 8 (eight) hours as needed. 30 tablet 0   amoxicillin-clavulanate (AUGMENTIN) 875-125 MG tablet Take 1 tablet by mouth every 12 (twelve) hours. 14 tablet 0   aspirin EC 81 MG tablet Take 81 mg by mouth  daily. Swallow whole.     aspirin-acetaminophen-caffeine (EXCEDRIN MIGRAINE) 250-250-65 MG tablet Take 2 tablets by mouth every 6 (six) hours as needed for headache.     Cyanocobalamin (VITAMIN B 12 PO) Take by mouth.     diclofenac Sodium (VOLTAREN) 1 % GEL APPLY 2 GRAMS TOPICALLY FOUR TIMES DAILY 350 g 1   diclofenac Sodium (VOLTAREN) 1 % GEL Apply 2 g topically 4 (four) times daily. 100 g 0   Ferrous Sulfate (IRON PO) Take by mouth.      glucose blood (ONETOUCH VERIO) test strip Check blood sugar before and 2 hours after a meal to see how food affects blood sugars once a day 100 each 5   Lancets (ONETOUCH DELICA PLUS [id number]) MISC USE 1 TO CHECK BLOOD GLUCOSE BEFORE AND 2 HOURS AFTER A MEAL TO SEE HOW FOOD AFFECTS BLOOD SUGAR ONCE DAILY. 100 each 5   lidocaine (LIDODERM) 5 % Place 1 patch onto the skin daily. Remove & Discard patch within 12 hours or as directed by MD (Patient not taking: No sig reported) 30 patch 0   lisinopril-hydrochlorothiazide (ZESTORETIC) 20-25 MG tablet TAKE 1 TABLET BY MOUTH DAILY. 90 tablet 1   MELATONIN PO Take 5 mg by mouth at bedtime.  pantoprazole (PROTONIX) 40 MG tablet TAKE 1 TABLET BY MOUTH TWICE DAILY. 180 tablet 1   promethazine (PHENERGAN) 12.5 MG tablet TAKE 1 TABLET (12.5 MG TOTAL) BY MOUTH DAILY AS NEEDED FOR NAUSEA OR VOMITING. 20 tablet 3   simvastatin (ZOCOR) 40 MG tablet TAKE 1 TABLET (40 MG TOTAL) BY MOUTH AT BEDTIME. 90 tablet 1   cyclobenzaprine (FLEXERIL) 10 MG tablet TAKE 1 TABLET BY MOUTH TWO TIMES DAILY AS NEEDED FOR MUSCLE SPASMS 30 tablet 0   No facility-administered medications prior to visit.   Objective:   BP 139/78 (BP Location: Right Arm, Patient Position: Sitting, Cuff Size: Small)   Pulse 86   Temp 98.5 F (36.9 C) (Oral)   Ht 5' (1.524 m)   Wt 180 lb 3.2 oz (81.7 kg)   SpO2 100%   BMI 35.19 kg/m  Wt Readings from Last 3 Encounters:  11/20/20 180 lb 3.2 oz (81.7 kg)  08/20/20 176 lb (79.8 kg)  06/05/20 174 lb (78.9  kg)   BP Readings from Last 3 Encounters:  11/20/20 139/78  11/09/20 132/83  08/20/20 135/83   Constitutional: well-appearing woman sitting in chair, in no acute distress HENT: normocephalic atraumatic, mucous membranes moist Eyes: conjunctiva non-erythematous Neck: supple Cardiovascular: regular rate and rhythm, no m/r/g, no lower extremity edema Pulmonary/Chest: normal work of breathing on room air, lungs clear to auscultation bilaterally Abdominal: soft, non-tender, non-distended MSK: normal bulk and tone; bilateral knees normal in appearance without erythema or swelling, no joint line tenderness, full ROM, no crepitus; L knee without patellar or patellar/quadriceps tenderness with palpation Neurological: alert & oriented x 3, normal gait Skin: warm and dry Psych: normal mood and affect  Health Maintenance:   Health Maintenance  Topic Date Due   PNEUMOCOCCAL POLYSACCHARIDE VACCINE AGE 58-64 HIGH RISK  Never done   Pneumococcal Vaccine 62-10 Years old (1 - PCV) Never done   Zoster Vaccines- Shingrix (1 of 2) Never done   PAP SMEAR-Modifier  04/06/2012   COVID-19 Vaccine (4 - Booster for Pfizer series) 09/15/2020   INFLUENZA VACCINE  01/06/2021   MAMMOGRAM  01/25/2021   FOOT EXAM  04/22/2021   HEMOGLOBIN A1C  05/22/2021   OPHTHALMOLOGY EXAM  08/21/2021   TETANUS/TDAP  02/24/2024   COLONOSCOPY (Pts 58-65yrs Insurance coverage will need to be confirmed)  01/25/2030   Hepatitis C Screening  Completed   HIV Screening  Completed   HPV VACCINES  Aged Out   COLON CANCER SCREENING ANNUAL FOBT  Discontinued   Assessment & Plan:   Problem List Items Addressed This Visit       Cardiovascular and Mediastinum   Hypertension    BP 127/77 (after repeating) today. Patient reports adherence to lisinopril-HCTZ 20-25 mg daily. She reports since our last visit she underwent a home sleep study, however has been told she needs to repeat it, she is unsure of the reason.   Last CMP 05/2020  unremarkable.   A/P: BP at goal of <130/88 today. She has been told she needs to schedule a repeat sleep study. - continue lisinopril-HCTZ 20-25 mg daily - patient to schedule repeat sleep study - repeat BMP ~05/2021         Respiratory   Sleep apnea    Patient reports since our last visit she underwent a home sleep study, however has been told she needs to repeat it, she is unsure of the reason.    Plan - patient to schedule repeat sleep study  Endocrine   Diabetes type 2, uncontrolled ([place]) - Primary    Hemoglobin A1c 7.3% today, stable from 7.2% at last check 3 months ago (08/20/20). Since our last visit, patient was scheduled to see Butch Penny for diabetes education and diet/lifestyle coaching, however the patient had to cancel the appointment.   We had a long discussion today regarding the evidence behind initiating medication for diabetes. Patient has previously been adamant about not starting medication. She does express frustration that her weight is stable (180 lb) and that she has not been able to make significant changes to her diet or lifestyle. We discussed option of starting a GLP-1 agonist to treat both diabetes and help with weight loss, and she would like to consult with her children. She is agreeable to scheduling to meet with Butch Penny again.  Plan: - I have reached out to Butch Penny, our diabetes educator, to have the patient rescheduled - f/u in 3 months, continue discussion about GLP-1 agonist       Multiple thyroid nodules    Patient is now amenable to getting recommended thyroid ultrasound. Order for thyroid ultrasound placed today.          Other   Antibiotic-induced yeast infection    Patient was recently treated in the ED on 11/09/20 for pansinusitis with a 7-day course of Augmentin. She reports 3-4 days into treatment she began to experience itching in her vagina. Denies abnormal vaginal discharge or dysuria, however she states she feels like her symptoms  are the same as yeast infections in the past.   A/P: Suspect antibiotic-induced yeast infection. Will treat empirically with Diflucan 150 mg once followed by an additional 150 mg dose 72 hours later if symptoms do not improve.       Relevant Medications   fluconazole (DIFLUCAN) 150 MG tablet   Fibromyalgia    Patient requests refill of Flexeril today. She was prescribed a 2-week supply 2 months ago so has only been using intermittently. On review of her chart, she did previously seen pain clinic and was prescribed narcotics, however she states this was not effective. She denies feeling dizzy or fatigued with her Flexeril.   Plan: - flexeril 10 mg twice daily PRN, #30       Relevant Medications   cyclobenzaprine (FLEXERIL) 10 MG tablet   Patellar pain, left    Patient reports she has had L knee pain for the last 2 weeks. States the pain comes on with kneeling, such as when she is at church. She is starting to have the pain while walking. She has tried heat, ibuprofen, however she has not tried using a cushion while kneeling. She has a history of calcaneus fracture and surgery of the L foot and wonders if the pain may be related.  Her physical exam is unremarkable, no swelling, erythema, or tenderness with palpation. Full ROM.   A/P: Suspect patient may have some bruising of her patella from overuse. I do not suspect the pain is related to her previous calcaneus injury.  - Recommend continuing heat and ibuprofen - counseled patient to use padding while kneeling        Other Visit Diagnoses     Thyroid nodule       Relevant Orders   US THYROID        Return in about 3 months (around 02/20/2021).   Pt discussed with Dr. Jimmye Norman.  Alexandria Lodge, MD Internal Medicine Resident, PGY-1 [facility] Pager: [phone] 11:53 AM, 11/21/2020

## 2020-11-20 NOTE — Patient Instructions (Addendum)
Sherry [name] Pulling,   Thank you for your visit to the [facility] today. It was a pleasure seeing you. Today we discussed the following:  1) Yeast infection: I have prescribed Diflucan 150 mg. Please take one dose and if you are not feeling better 72 hours later, take the 2nd dose.  2) Fibromyalgia: I refilled your Flexeril.  3) Thyroid nodules: I ordered a thyroid ultrasound.  4) Diabetes: your A1c is stable, however I think you should think seriously about starting medication for your diabetes. I will send a message to Butch Penny for you to meet with her. I recommend you discuss semaglutide with your family.  5) Hypertension: continue your medication. Please reschedule your sleep study (call [facility])  6) Left knee pain: please start using padding under your left knee while knealing. Otherwise, try Tylenol and/or ibuprofen and/or voltaren gel.   It has been an honor being your primary care doctor over the last year. You have been assigned a new PCP, Dr. Benancio Deeds. If you haven't already, you should be receiving a letter in the mail with this information. Please call the clinic on or after July 6 to schedule your next follow-up appointment for 3 months from today.     If you have any questions or concerns, please call our clinic at [phone] between 9am-5pm. Outside of these hours, call [phone] and ask for the internal medicine resident on call. If you feel you are having a medical emergency please call 911.

## 2020-11-21 ENCOUNTER — Encounter: Payer: 59 | Admitting: Internal Medicine

## 2020-11-21 DIAGNOSIS — B379 Candidiasis, unspecified: Secondary | ICD-10-CM | POA: Insufficient documentation

## 2020-11-21 DIAGNOSIS — M25562 Pain in left knee: Secondary | ICD-10-CM | POA: Insufficient documentation

## 2020-11-21 NOTE — Assessment & Plan Note (Signed)
Patient reports she has had L knee pain for the last 2 weeks. States the pain comes on with kneeling, such as when she is at church. She is starting to have the pain while walking. She has tried heat, ibuprofen, however she has not tried using a cushion while kneeling. She has a history of calcaneus fracture and surgery of the L foot and wonders if the pain may be related.  Her physical exam is unremarkable, no swelling, erythema, or tenderness with palpation. Full ROM.   A/P: Suspect patient may have some bruising of her patella from overuse. I do not suspect the pain is related to her previous calcaneus injury.  - Recommend continuing heat and ibuprofen - counseled patient to use padding while kneeling

## 2020-11-21 NOTE — Assessment & Plan Note (Signed)
Patient reports since our last visit she underwent a home sleep study, however has been told she needs to repeat it, she is unsure of the reason.    Plan - patient to schedule repeat sleep study

## 2020-11-21 NOTE — Assessment & Plan Note (Signed)
BP 127/77 (after repeating) today. Patient reports adherence to lisinopril-HCTZ 20-25 mg daily. She reports since our last visit she underwent a home sleep study, however has been told she needs to repeat it, she is unsure of the reason.   Last CMP 05/2020 unremarkable.   A/P: BP at goal of <130/88 today. She has been told she needs to schedule a repeat sleep study. - continue lisinopril-HCTZ 20-25 mg daily - patient to schedule repeat sleep study - repeat BMP ~05/2021

## 2020-11-21 NOTE — Assessment & Plan Note (Signed)
Patient is now amenable to getting recommended thyroid ultrasound. Order for thyroid ultrasound placed today.

## 2020-11-21 NOTE — Assessment & Plan Note (Signed)
Hemoglobin A1c 7.3% today, stable from 7.2% at last check 3 months ago (08/20/20). Since our last visit, patient was scheduled to see Butch Penny for diabetes education and diet/lifestyle coaching, however the patient had to cancel the appointment.   We had a long discussion today regarding the evidence behind initiating medication for diabetes. Patient has previously been adamant about not starting medication. She does express frustration that her weight is stable (180 lb) and that she has not been able to make significant changes to her diet or lifestyle. We discussed option of starting a GLP-1 agonist to treat both diabetes and help with weight loss, and she would like to consult with her children. She is agreeable to scheduling to meet with Butch Penny again.  Plan: - I have reached out to Butch Penny, our diabetes educator, to have the patient rescheduled - f/u in 3 months, continue discussion about GLP-1 agonist

## 2020-11-21 NOTE — Assessment & Plan Note (Signed)
Patient was recently treated in the ED on 11/09/20 for pansinusitis with a 7-day course of Augmentin. She reports 3-4 days into treatment she began to experience itching in her vagina. Denies abnormal vaginal discharge or dysuria, however she states she feels like her symptoms are the same as yeast infections in the past.   A/P: Suspect antibiotic-induced yeast infection. Will treat empirically with Diflucan 150 mg once followed by an additional 150 mg dose 72 hours later if symptoms do not improve.

## 2020-11-21 NOTE — Assessment & Plan Note (Signed)
Patient requests refill of Flexeril today. She was prescribed a 2-week supply 2 months ago so has only been using intermittently. On review of her chart, she did previously seen pain clinic and was prescribed narcotics, however she states this was not effective. She denies feeling dizzy or fatigued with her Flexeril.   Plan: - flexeril 10 mg twice daily PRN, #30

## 2020-11-25 ENCOUNTER — Telehealth: Payer: Self-pay | Admitting: Dietician

## 2020-11-25 NOTE — Telephone Encounter (Signed)
Sh scheduled an appointment for 12/03/20 at 215 pm

## 2020-11-27 NOTE — Progress Notes (Signed)
Internal Medicine Clinic Attending  Case discussed with Dr. Watson  At the time of the visit.  We reviewed the resident's history and exam and pertinent patient test results.  I agree with the assessment, diagnosis, and plan of care documented in the resident's note.  

## 2020-12-02 ENCOUNTER — Ambulatory Visit: Payer: 59 | Admitting: Dietician

## 2020-12-10 ENCOUNTER — Ambulatory Visit: Payer: 59 | Admitting: Dietician

## 2020-12-10 ENCOUNTER — Encounter: Payer: Self-pay | Admitting: Internal Medicine

## 2020-12-10 ENCOUNTER — Encounter: Payer: Self-pay | Admitting: *Deleted

## 2020-12-19 ENCOUNTER — Other Ambulatory Visit (HOSPITAL_COMMUNITY): Payer: Self-pay

## 2020-12-19 ENCOUNTER — Other Ambulatory Visit: Payer: Self-pay | Admitting: Internal Medicine

## 2020-12-19 ENCOUNTER — Other Ambulatory Visit: Payer: Self-pay | Admitting: Student

## 2020-12-19 DIAGNOSIS — M797 Fibromyalgia: Secondary | ICD-10-CM

## 2020-12-19 MED ORDER — CYCLOBENZAPRINE HCL 10 MG PO TABS
10.0000 mg | ORAL_TABLET | Freq: Two times a day (BID) | ORAL | 0 refills | Status: DC | PRN
  Filled 2020-12-19 – 2021-01-20 (×3): qty 30, 15d supply, fill #0

## 2020-12-19 MED ORDER — DICLOFENAC SODIUM 1 % EX GEL
2.0000 g | Freq: Four times a day (QID) | CUTANEOUS | 0 refills | Status: DC
  Filled 2020-12-19 – 2021-01-08 (×2): qty 100, 10d supply, fill #0
  Filled 2021-01-20: qty 100, 12d supply, fill #0

## 2020-12-19 MED FILL — Promethazine HCl Tab 12.5 MG: ORAL | 20 days supply | Qty: 20 | Fill #2 | Status: CN

## 2020-12-19 NOTE — Telephone Encounter (Signed)
Will refill for 1 month. Please schedule patient an appointment to be seen by her PCP for further discussion concerning her fibromyalgia treatment and chronic use of flexeril. Thanks!

## 2020-12-20 ENCOUNTER — Other Ambulatory Visit (HOSPITAL_COMMUNITY): Payer: Self-pay

## 2020-12-30 ENCOUNTER — Ambulatory Visit (INDEPENDENT_AMBULATORY_CARE_PROVIDER_SITE_OTHER): Payer: Self-pay | Admitting: Internal Medicine

## 2020-12-30 ENCOUNTER — Other Ambulatory Visit (HOSPITAL_COMMUNITY): Payer: Self-pay

## 2020-12-30 ENCOUNTER — Ambulatory Visit (INDEPENDENT_AMBULATORY_CARE_PROVIDER_SITE_OTHER): Payer: 59 | Admitting: Dietician

## 2020-12-30 ENCOUNTER — Encounter: Payer: Self-pay | Admitting: Dietician

## 2020-12-30 VITALS — BP 156/93 | HR 88 | Temp 98.5°F | Ht 60.0 in | Wt 177.0 lb

## 2020-12-30 DIAGNOSIS — E11649 Type 2 diabetes mellitus with hypoglycemia without coma: Secondary | ICD-10-CM

## 2020-12-30 DIAGNOSIS — E7800 Pure hypercholesterolemia, unspecified: Secondary | ICD-10-CM

## 2020-12-30 DIAGNOSIS — I10 Essential (primary) hypertension: Secondary | ICD-10-CM

## 2020-12-30 DIAGNOSIS — K219 Gastro-esophageal reflux disease without esophagitis: Secondary | ICD-10-CM

## 2020-12-30 DIAGNOSIS — M797 Fibromyalgia: Secondary | ICD-10-CM

## 2020-12-30 DIAGNOSIS — E1165 Type 2 diabetes mellitus with hyperglycemia: Secondary | ICD-10-CM

## 2020-12-30 MED ORDER — CONTOUR NEXT TEST VI STRP
ORAL_STRIP | 5 refills | Status: DC
  Filled 2020-12-30 – 2021-01-08 (×2): qty 50, 25d supply, fill #0
  Filled 2021-01-20: qty 50, 30d supply, fill #0
  Filled 2021-04-15: qty 50, 30d supply, fill #1

## 2020-12-30 MED ORDER — MICROLET LANCETS MISC
5 refills | Status: DC
  Filled 2020-12-30 – 2021-04-15 (×2): qty 100, 30d supply, fill #0

## 2020-12-30 MED ORDER — SIMVASTATIN 40 MG PO TABS
40.0000 mg | ORAL_TABLET | Freq: Every day | ORAL | 1 refills | Status: DC
  Filled 2020-12-30 – 2021-01-20 (×3): qty 30, 30d supply, fill #0
  Filled 2021-02-24: qty 30, 30d supply, fill #1
  Filled 2021-03-28 – 2021-05-15 (×3): qty 30, 30d supply, fill #2

## 2020-12-30 MED ORDER — LISINOPRIL-HYDROCHLOROTHIAZIDE 20-25 MG PO TABS
1.0000 | ORAL_TABLET | Freq: Every day | ORAL | 1 refills | Status: DC
  Filled 2020-12-30 – 2021-01-20 (×3): qty 30, 30d supply, fill #0
  Filled 2021-02-24: qty 30, 30d supply, fill #1
  Filled 2021-03-28 – 2021-05-15 (×3): qty 30, 30d supply, fill #2

## 2020-12-30 MED ORDER — GABAPENTIN 300 MG PO CAPS
300.0000 mg | ORAL_CAPSULE | Freq: Two times a day (BID) | ORAL | 2 refills | Status: DC
  Filled 2020-12-30 – 2021-01-20 (×3): qty 60, 30d supply, fill #0
  Filled 2021-05-28: qty 60, 30d supply, fill #1
  Filled 2021-06-27: qty 60, 30d supply, fill #2

## 2020-12-30 MED ORDER — PROMETHAZINE HCL 12.5 MG PO TABS
12.5000 mg | ORAL_TABLET | Freq: Three times a day (TID) | ORAL | 0 refills | Status: DC | PRN
  Filled 2020-12-30 – 2021-01-20 (×4): qty 20, 7d supply, fill #0

## 2020-12-30 MED ORDER — PANTOPRAZOLE SODIUM 40 MG PO TBEC
40.0000 mg | DELAYED_RELEASE_TABLET | Freq: Two times a day (BID) | ORAL | 1 refills | Status: DC
  Filled 2020-12-30 – 2021-01-20 (×3): qty 60, 30d supply, fill #0
  Filled 2021-02-24: qty 60, 30d supply, fill #1
  Filled 2021-03-28 – 2021-04-16 (×3): qty 60, 30d supply, fill #2

## 2020-12-30 NOTE — Progress Notes (Signed)
  Medical Nutrition Therapy:  Appt start time: [id number] end time:  1125. Total time: 40 Visit #2  Assessment:  Primary concerns today: meal planning and care of diabetes,  weight loss and eating healthy. "My blood pressure was high today". Ms. Contrera has a significant other who is doing her cooking and some food shopping,. She works 5 days a week form 8 Am to 5 PM.  She lost her meter so was provided another sample today. She asked when she should check and what it should be.   We discussed rinsing her canned vegetables and eating more fruits,veggies, nuts,legumes and whole grains to help lower her blood pressure.  Preferred Learning Style: No preference indicated  Learning Readiness: Ready  ANTHROPOMETRICS:Estimated body mass index is 34.57 kg/m as calculated from the following:   Height as of an earlier encounter on 12/30/20: 5' (1.524 m).   Weight as of an earlier encounter on 12/30/20: 177 lb (80.3 kg).  WEIGHT HISTORY:  Wt Readings from Last 10 Encounters:  12/30/20 177 lb (80.3 kg)  11/20/20 180 lb 3.2 oz (81.7 kg)  08/20/20 176 lb (79.8 kg)  06/05/20 174 lb (78.9 kg)  05/14/20 177 lb 11.2 oz (80.6 kg)  05/10/20 178 lb (80.7 kg)  04/22/20 178 lb 1.6 oz (80.8 kg)  04/17/20 173 lb 9.6 oz (78.7 kg)  03/20/20 175 lb 6.4 oz (79.6 kg)  01/26/20 181 lb (82.1 kg)   MEDICATIONS: none for diabetes BLOOD SUGAR: not done today Lab Results  Component Value Date   HGBA1C 7.3 (A) 11/20/2020   HGBA1C 7.2 (A) 08/20/2020   HGBA1C 7.4 (A) 04/22/2020   HGBA1C 6.4 (A) 12/28/2018   HGBA1C 6.2 09/10/2011    BP Readings from Last 3 Encounters:  12/30/20 (!) 156/93  11/20/20 139/78  11/09/20 132/83    DIETARY INTAKE: Usual eating pattern includes 1-3 meals and 0-1 snacks per day. Everyday foods include eggs, vegetables, chicken  Dining Out (times/week): 4-5 meals a week 24-hr recall:  B ( AM): soft egg with guacamole or skips L ( PM):  Often skips on weekends, Out 2x/week- [employer] whopper  with cheese, [employer] 2 piece dinner with dirty rice or sandwich or leftovers D (730-8 PM): her main meal- likes chicken, pasta,. Many veggies, rice, 2 oxtails, cabbage Beverages: coffee with less sugar, water, 1 8 oz can regular soda per day  Usual physical activity:  Walks in warmer weather, thinking about dancing   Progress Towards Goal(s):  Some progress.   Nutritional Diagnosis:  NB-1.1 Food and nutrition-related knowledge deficit As related to newly diagnosed diabetes.  As evidenced by her report and questions and A1C of 7.4%.    Intervention:  Nutrition education about  diabetes meal planning including when, what and portion sizes as well as  self monitoring. She was provided with a sample one touch verio reflect meter today and was assisted in setting up the [facility] Reveal app.. Action Goal: see patient instructions  Outcome goal: lower a1c and improved knowledge about diabetes meal planning Coordination of care:  Requested testing supplies  Teaching Method Utilized: Visual, Auditory,Hands on Handouts given during visit include:plate method of balancing snacks/meals  After visit summary Barriers to learning/adherence to lifestyle change: unknown/competing values Demonstrated degree of understanding via:  Teach Back   Monitoring/Evaluation:  Dietary intake, exercise, meter, and body weight in 12 week(s)  Debera Lat, RD 12/30/2020 1:33 PM. .  ,

## 2020-12-30 NOTE — Assessment & Plan Note (Signed)
Sherry [name] complains of chronic fibromyalgia pain most notably in her bilateral legs, lower back and between her scapula of the back.  She has tried acetaminophen '650mg'$  with minimal relief.  In the past she has tried duloxetine and states it caused her extreme coughing, vomiting and nausea causing her to discontinue use.  She has also tried Lyrica and reports nausea and vomiting causing her to discontinue use.  She has also tried gabapentin and she reports it causes her to gain weight.  An extensive discussion about the benefit of gabapentin was discussed with Sherry [name].  Weight gain is not a usual side effect and she acknowledges.   PLAN: Started on gabapentin 300 mg twice daily, will titrate up as tolerated.  She has agreed to this regiment due to possibly starting GLP-1 that will help with weight loss to combat her reported past side effect of weight gain with gabapentin. Follow-up in 3 months

## 2020-12-30 NOTE — Progress Notes (Signed)
CC: Medication refill, hypertension, diabetes control, chronic fibromyalgia pain  HPI:  Sherry [name] is a 60 y.o. female with a past medical history stated below and presents today for medication refill, hypertension, diabetes control, chronic fibromyalgia pain.  Sherry [name] presents today with complaints of chronic fibromyalgia pain.  Sherry [name] states the pains are in bilateral legs, between the scapula of her back, and lower back.  Sherry [name] reports following pain management in the past and was prescribed acetaminophen 650 mg every 8 hours as needed with minimal relief.  Sherry [name] is currently taking nothing for the pain.  Sherry [name] is unable to exercise due to the pain.  Sherry [name] also reports recent elevated blood pressures.  Sherry [name] states that Sherry [name] has been without blood pressure medication for the last 5 days.  Sherry [name] is in need for a refill.  Sherry [name] is not on any diabetic medication at this time.  Sherry [name] is hesitant to begin medication due to researching side effects.  Current A1c 7.3 last checked June 2022.  Sherry [name] has an appointment with Butch Penny today and is considering starting diabetic medication.  Medications will be refilled today per her request.    Please see problem based assessment and plan for additional details.  Past Medical History:  Diagnosis Date   Acid reflux disease    Anginal pain ([place])    last time 04/18/13 in afternoon; referred to 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 Swallow whole.     aspirin-acetaminophen-caffeine (EXCEDRIN MIGRAINE) 250-250-65 MG tablet Take 2 tablets by mouth every 6 (six) hours as needed for headache.     Cyanocobalamin (VITAMIN B 12 PO) Take by mouth.     cyclobenzaprine (FLEXERIL) 10 MG tablet Take 1 tablet (10 mg total) by mouth 2 (two) times daily as needed for muscle spasms. 30 tablet 0   diclofenac Sodium (VOLTAREN) 1 % GEL Apply 2 grams topically 4 (four) times daily. 100 g 0   Ferrous Sulfate (IRON PO) Take by mouth.      lidocaine (LIDODERM) 5 % Place 1 patch onto the skin daily. Remove & Discard patch within 12 hours or as directed by MD (Patient not taking: No sig reported) 30 patch 0   MELATONIN PO Take 5 mg by mouth at bedtime.      No current facility-administered medications on file prior to visit.    Family History  Problem Relation Age of Onset   Diabetes Mother    Hypertension Mother    Depression Mother    Diabetes Sister    Hypertension Father    Cancer Father        Prostate   Alcohol abuse Father    Cancer Brother  Prostate   Breast cancer Neg Hx    Colon cancer Neg Hx    Colon polyps Neg Hx    Esophageal cancer Neg Hx    Stomach cancer Neg Hx    Rectal cancer Neg Hx     Social History   Socioeconomic History   Marital status: Single    Spouse name: Not on file   Number of children: 2   Years of education: Not on file   Highest education level: 12th grade  Occupational History   Occupation: Unemployed  Tobacco Use   Smoking status: Never   Smokeless tobacco: Never  Vaping Use   Vaping Use: Never used  Substance and Sexual Activity   Alcohol use: No   Drug use: No   Sexual activity: Yes    Birth control/protection: None    Comment: last intercouse two months ago  Other Topics Concern   Not on file  Social History Narrative   Lives with   2 Children: Ages 91 and 16   4 Grandchildren: 2, 3,  4 and 33   Brother      Diet: "Barely eats". Does not exercise due to pain.      03/2011- Currently unemployed. 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 Unsure of last pap.   Unsure of last Td   Unsure of last flu shot   Social Determinants of Radio broadcast assistant Strain: Not on file  Food Insecurity: Not on file  Transportation Needs: Not on file  Physical Activity: Not on file  Stress: Not on file  Social Connections: Not on file  Intimate Partner Violence: Not on file    Review of Systems  Constitutional:  Negative for chills and fever.  Eyes:  Negative for blurred vision and double vision.  Respiratory:  Negative for shortness of breath and wheezing.   Cardiovascular:  Negative for chest pain, palpitations and leg swelling.  Gastrointestinal:  Positive for heartburn and nausea. Negative for abdominal pain, constipation, diarrhea and vomiting.  Genitourinary:  Negative for dysuria.  Musculoskeletal:  Positive for back pain and myalgias.  Neurological:  Negative for dizziness.    Vitals:   12/30/20 0911  BP: (!) 156/93  Pulse: 88  Temp: 98.5 F (36.9 C)  TempSrc: Oral  SpO2: 100%  Weight: 177 lb (80.3 kg)  Height: 5' (1.524 m)     Physical Exam Constitutional:      Appearance: Normal appearance.  HENT:     Head: Normocephalic and atraumatic.  Cardiovascular:     Rate and Rhythm: Normal rate and regular rhythm.     Heart sounds: Normal heart sounds, S1 normal and S2 normal.  Pulmonary:     Effort: Pulmonary effort is normal.     Breath sounds: Normal breath sounds and air entry.  Musculoskeletal:     Lumbar back: Tenderness present.     Right lower leg: No edema.     Left lower leg: No edema.  Skin:    General: Skin is warm and dry.  Neurological:     General: No focal deficit present.     Mental Status: Sherry [name] is alert.  Psychiatric:        Attention and Perception: Attention normal.         Mood and Affect: Mood normal.        Behavior: Behavior normal. Behavior is cooperative.      Assessment &  Plan:   See Encounters Tab for problem based charting.  Patient seen with Dr. Blenda Bridegroom, M.D. [facility], PGY-1 Pager: [phone], Phone: [phone] Date 12/30/2020 Time 11:18 AM

## 2020-12-30 NOTE — Assessment & Plan Note (Signed)
Sherry [name] was previously recommended to try GLP-1 for diabetic control.  She declined at that time.  She is considering GLP-1 for management.  She is concerned about side effects.  An extensive discussion was had about the benefits of GLP-1 for weight loss and diabetic control.  Current A1c 7.3, 4 months ago A1c was 7.2.  PLAN: Sherry [name] reports a scheduled visit with Butch Penny today for diabetic education. Will call Sherry [name] in a few days she is she is decided to start diabetic management. Follow-up in 3 months.

## 2020-12-30 NOTE — Assessment & Plan Note (Signed)
Current blood pressure 156/93.  Sherry [name] reports being without her blood pressure medication for the last 5 days.  She denies any lightheadedness, dizziness, headaches, chest pain, SOB at this time.  Denies any adverse effects to current medication.  PLAN: Refill current prescription of lisinopril-hydrochlorothiazide 20-25 mg daily. Continue current blood pressure management. Follow-up in 3 months.

## 2020-12-30 NOTE — Patient Instructions (Addendum)
Check your garlic to be sure it does not have phosphoric acid, citric acid is okay it is vitamin C.   Try to eat 3 times a day well balanced meals or snacks  Try to eat healthy foods: Eat more...  Whole grains: whole grain cereal, whole wheat crackers, whole wheat bread and pasta, old fashioned oats & brown rice Whole fruits-1 cup a day Vegetables- 2-3 cups a day Lowfat Protein: Beans, chicken, [place], yogurt, lean cuts of beef and pork, fish 2-3 times a week Nuts, Seeds and Soy: walnuts, peanuts, pecans, peanut butter  Foods with omega-3 fatty acids- salmon, tuna, flax seeds, chia seeds Foods with polyphenols (berries, apples, citrus, dark chocolate, tea, coffee)  Eat Less... Refined carb foods (white bread, white rice, some cereals, cake, candy) ?Sugar-sweetened drinks ?Red meat  ?Processed meats (bacon, sausage, cold cuts) ?Margarine, shortening, lard ?Ultra-processed foods (packaged snacks, poultry nuggets, instant noodles, candy, additives)  Limit portion of starchy and sugary foods.   Let's follow up in October.  Butch Penny [phone]

## 2021-01-01 ENCOUNTER — Other Ambulatory Visit (HOSPITAL_COMMUNITY): Payer: Self-pay

## 2021-01-02 NOTE — Progress Notes (Signed)
Internal Medicine Clinic Attending  I saw and evaluated the patient.  I personally confirmed the key portions of the history and exam documented by Dr. Ariwodo and I reviewed pertinent patient test results.  The assessment, diagnosis, and plan were formulated together and I agree with the documentation in the resident's note.   

## 2021-01-02 NOTE — Addendum Note (Signed)
Addended by: Aldine Contes on: 01/02/2021 02:09 PM   Modules accepted: Level of Service

## 2021-01-07 ENCOUNTER — Other Ambulatory Visit (HOSPITAL_COMMUNITY): Payer: Self-pay

## 2021-01-08 ENCOUNTER — Other Ambulatory Visit (HOSPITAL_COMMUNITY): Payer: Self-pay

## 2021-01-13 ENCOUNTER — Other Ambulatory Visit (HOSPITAL_COMMUNITY): Payer: Self-pay

## 2021-01-16 ENCOUNTER — Other Ambulatory Visit (HOSPITAL_COMMUNITY): Payer: Self-pay

## 2021-01-20 ENCOUNTER — Other Ambulatory Visit (HOSPITAL_COMMUNITY): Payer: Self-pay

## 2021-01-27 ENCOUNTER — Other Ambulatory Visit (HOSPITAL_COMMUNITY): Payer: Self-pay

## 2021-02-24 ENCOUNTER — Other Ambulatory Visit: Payer: Self-pay | Admitting: Internal Medicine

## 2021-02-24 ENCOUNTER — Other Ambulatory Visit: Payer: Self-pay | Admitting: Student

## 2021-02-24 ENCOUNTER — Other Ambulatory Visit (HOSPITAL_COMMUNITY): Payer: Self-pay

## 2021-02-24 DIAGNOSIS — M797 Fibromyalgia: Secondary | ICD-10-CM

## 2021-02-24 MED ORDER — CYCLOBENZAPRINE HCL 10 MG PO TABS
10.0000 mg | ORAL_TABLET | Freq: Two times a day (BID) | ORAL | 0 refills | Status: DC | PRN
  Filled 2021-02-24: qty 30, 15d supply, fill #0

## 2021-02-24 MED ORDER — PROMETHAZINE HCL 12.5 MG PO TABS
12.5000 mg | ORAL_TABLET | Freq: Three times a day (TID) | ORAL | 0 refills | Status: DC | PRN
  Filled 2021-02-24: qty 20, 7d supply, fill #0

## 2021-02-25 ENCOUNTER — Other Ambulatory Visit (HOSPITAL_COMMUNITY): Payer: Self-pay

## 2021-03-28 ENCOUNTER — Other Ambulatory Visit: Payer: Self-pay | Admitting: Physical Medicine and Rehabilitation

## 2021-03-28 ENCOUNTER — Other Ambulatory Visit (HOSPITAL_COMMUNITY): Payer: Self-pay

## 2021-03-28 ENCOUNTER — Other Ambulatory Visit: Payer: Self-pay | Admitting: Student

## 2021-03-28 ENCOUNTER — Other Ambulatory Visit: Payer: Self-pay | Admitting: Internal Medicine

## 2021-03-28 DIAGNOSIS — M797 Fibromyalgia: Secondary | ICD-10-CM

## 2021-03-31 ENCOUNTER — Other Ambulatory Visit: Payer: Self-pay | Admitting: Internal Medicine

## 2021-03-31 ENCOUNTER — Other Ambulatory Visit (HOSPITAL_COMMUNITY): Payer: Self-pay

## 2021-03-31 DIAGNOSIS — M797 Fibromyalgia: Secondary | ICD-10-CM

## 2021-03-31 MED ORDER — DICLOFENAC SODIUM 1 % EX GEL
2.0000 g | Freq: Four times a day (QID) | CUTANEOUS | 0 refills | Status: DC
  Filled 2021-03-31 – 2021-04-15 (×2): qty 100, 12d supply, fill #0

## 2021-04-01 ENCOUNTER — Other Ambulatory Visit (HOSPITAL_COMMUNITY): Payer: Self-pay

## 2021-04-01 MED ORDER — PROMETHAZINE HCL 12.5 MG PO TABS
12.5000 mg | ORAL_TABLET | Freq: Three times a day (TID) | ORAL | 1 refills | Status: DC | PRN
  Filled 2021-04-01 – 2021-04-15 (×2): qty 20, 7d supply, fill #0

## 2021-04-01 NOTE — Telephone Encounter (Addendum)
Last visit 12/30/2020 with instructions to return in 66mths  Pt canceled appt scheduled for 04/03/21  Will send refill request to pcp/team   Appt request to front office

## 2021-04-01 NOTE — Telephone Encounter (Signed)
Patient has been prescribed gabapentin for fibromyalgia; Patient was last seen in July when the prescription of gabapentin was given. Patient will need to schedule in-person or telehealth to evaluate if gabapentin improved symptoms before refilling flexeril.

## 2021-04-03 ENCOUNTER — Encounter: Payer: 59 | Admitting: Internal Medicine

## 2021-04-07 ENCOUNTER — Other Ambulatory Visit (HOSPITAL_COMMUNITY): Payer: Self-pay

## 2021-04-08 ENCOUNTER — Other Ambulatory Visit (HOSPITAL_COMMUNITY): Payer: Self-pay

## 2021-04-09 ENCOUNTER — Other Ambulatory Visit (HOSPITAL_COMMUNITY): Payer: Self-pay

## 2021-04-15 ENCOUNTER — Other Ambulatory Visit (HOSPITAL_COMMUNITY): Payer: Self-pay

## 2021-04-16 ENCOUNTER — Other Ambulatory Visit (HOSPITAL_COMMUNITY): Payer: Self-pay

## 2021-04-17 ENCOUNTER — Other Ambulatory Visit (HOSPITAL_COMMUNITY): Payer: Self-pay

## 2021-04-17 ENCOUNTER — Encounter: Payer: 59 | Admitting: Internal Medicine

## 2021-05-15 ENCOUNTER — Other Ambulatory Visit (HOSPITAL_COMMUNITY): Payer: Self-pay

## 2021-05-26 ENCOUNTER — Other Ambulatory Visit (HOSPITAL_COMMUNITY): Payer: Self-pay

## 2021-05-26 ENCOUNTER — Ambulatory Visit (INDEPENDENT_AMBULATORY_CARE_PROVIDER_SITE_OTHER): Payer: Self-pay | Admitting: Internal Medicine

## 2021-05-26 VITALS — BP 120/82 | HR 95 | Temp 98.6°F | Ht 60.0 in | Wt 171.2 lb

## 2021-05-26 DIAGNOSIS — K219 Gastro-esophageal reflux disease without esophagitis: Secondary | ICD-10-CM

## 2021-05-26 DIAGNOSIS — E041 Nontoxic single thyroid nodule: Secondary | ICD-10-CM

## 2021-05-26 DIAGNOSIS — E119 Type 2 diabetes mellitus without complications: Secondary | ICD-10-CM

## 2021-05-26 DIAGNOSIS — M797 Fibromyalgia: Secondary | ICD-10-CM

## 2021-05-26 DIAGNOSIS — I10 Essential (primary) hypertension: Secondary | ICD-10-CM

## 2021-05-26 DIAGNOSIS — E11649 Type 2 diabetes mellitus with hypoglycemia without coma: Secondary | ICD-10-CM

## 2021-05-26 DIAGNOSIS — E7800 Pure hypercholesterolemia, unspecified: Secondary | ICD-10-CM

## 2021-05-26 LAB — GLUCOSE, CAPILLARY: Glucose-Capillary: 151 mg/dL — ABNORMAL HIGH (ref 70–99)

## 2021-05-26 LAB — POCT GLYCOSYLATED HEMOGLOBIN (HGB A1C): Hemoglobin A1C: 7.3 % — AB (ref 4.0–5.6)

## 2021-05-26 MED ORDER — PROMETHAZINE HCL 12.5 MG PO TABS
12.5000 mg | ORAL_TABLET | Freq: Three times a day (TID) | ORAL | 1 refills | Status: DC | PRN
  Filled 2021-05-26: qty 20, 7d supply, fill #0
  Filled 2021-06-23: qty 20, 7d supply, fill #1

## 2021-05-26 MED ORDER — DICLOFENAC SODIUM 1 % EX GEL
2.0000 g | Freq: Four times a day (QID) | CUTANEOUS | 0 refills | Status: DC
  Filled 2021-05-26: qty 100, 12d supply, fill #0

## 2021-05-26 MED ORDER — SIMVASTATIN 40 MG PO TABS
40.0000 mg | ORAL_TABLET | Freq: Every day | ORAL | 0 refills | Status: DC
  Filled 2021-05-26: qty 90, 90d supply, fill #0

## 2021-05-26 MED ORDER — PANTOPRAZOLE SODIUM 40 MG PO TBEC
40.0000 mg | DELAYED_RELEASE_TABLET | Freq: Two times a day (BID) | ORAL | 0 refills | Status: DC
  Filled 2021-05-26: qty 60, 30d supply, fill #0
  Filled 2021-06-27: qty 30, 30d supply, fill #1
  Filled 2021-07-23: qty 60, 30d supply, fill #2

## 2021-05-26 MED ORDER — CYCLOBENZAPRINE HCL 10 MG PO TABS
10.0000 mg | ORAL_TABLET | Freq: Two times a day (BID) | ORAL | 0 refills | Status: DC | PRN
Start: 2021-05-26 — End: 2021-06-23
  Filled 2021-05-26: qty 60, 30d supply, fill #0

## 2021-05-26 MED ORDER — LISINOPRIL-HYDROCHLOROTHIAZIDE 20-25 MG PO TABS
1.0000 | ORAL_TABLET | Freq: Every day | ORAL | 0 refills | Status: DC
  Filled 2021-05-26: qty 90, 90d supply, fill #0
  Filled 2021-06-23: qty 30, 30d supply, fill #0
  Filled 2021-07-23: qty 30, 30d supply, fill #1

## 2021-05-26 NOTE — Patient Instructions (Addendum)
It was good seeing you today   I have refilled the medications you've requested   I have started you on the diabetic medication, Ozempic (semaglutide) to help improve your diabetes and help with weight loss.   Will be doing some lab work today   Follow up in 3 months to see how everything is going

## 2021-05-26 NOTE — Assessment & Plan Note (Signed)
Patient has pain in multiple areas of the body including: back, legs and arms. Patient has tried multiple agents such as Cymbalta, Lyrica and gabapentin with minimal relief and intolerance of side effects. Patient has tried PT in the past with minimal relief. Patient defers revisiting PT due to past injury to left ankle. Patient takes Flexeril and reports moderate relief and would like to continue that medication for now. Patient encouraged to exercise and stretch.    PLAN: Continue Flexeril

## 2021-05-26 NOTE — Assessment & Plan Note (Signed)
Current regimen: lisionpril-HCTZ 20-25mg ; BP today was at goal. Patient is compliant with medication and denies any adverse effects. Patient denies any symptoms at this time. Will obtain BMP to assess kidney function.    Plan: BMP ordered today; FU results Continue current regimen: lisinopril-HCTZ 20-25mg 

## 2021-05-26 NOTE — Assessment & Plan Note (Signed)
Patient states she has been trying to eat a proper diet. Patient has lost 9lbs over the last 6 months. A1c today is 7.3%; unchanged from previous. Discussion was had a lengths to begin diabetic medication. Patient is interested in beginning GLP-1. Patient has a history of thyroid nodule. Will obtain US of thyroid and TSH/T4 to rule out thyroid cancer. GLP-1 has black box warning that this medication can not be used in individuals with Hx of or active thyroid cancer nor family hx of thyroid cancer. Patient denies family Hx of thyroid cancer.   PLAN: Check TSH/T4 Obtain thyroid ultrasound If thyroid studies negative- can initiate GLP-1

## 2021-05-26 NOTE — Progress Notes (Signed)
CC: BP check; A1c check; fibromyalgia  HPI:  [name] is a 60 y.o. female with a past medical history stated below and presents today for cc listed above. Please see problem based assessment and plan for additional details.  Past Medical History:  Diagnosis Date   Acid reflux disease    Anginal pain ([place])    last time 04/18/13 in afternoon; referred to 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 Swallow whole.     aspirin-acetaminophen-caffeine (EXCEDRIN MIGRAINE) 250-250-65 MG tablet Take 2 tablets by mouth every 6 (six) hours as needed for headache.     Cyanocobalamin (VITAMIN B 12 PO) Take by mouth.     Ferrous Sulfate (IRON PO) Take by mouth.      gabapentin (NEURONTIN) 300 MG capsule Take 1 capsule (300 mg total) by mouth 2 (two) times daily. 60 capsule 2   glucose blood (CONTOUR NEXT TEST) test strip Check blood sugar before and 2 hours after a meal to see how food affects blood sugars once a day 100 each 5   Microlet Lancets MISC USE 1 TO CHECK BLOOD GLUCOSE BEFORE AND 2 HOURS AFTER A MEAL TO SEE HOW FOOD AFFECTS BLOOD SUGAR ONCE DAILY. 100 each 5   lidocaine (LIDODERM) 5 % Place 1 patch onto the skin daily. Remove & Discard patch within 12 hours or as directed by MD (Patient not taking: No sig reported) 30 patch 0   MELATONIN PO Take 5 mg by mouth at bedtime.      No  current facility-administered medications on file prior to visit.    Family History  Problem Relation Age of Onset   Diabetes Mother    Hypertension Mother    Depression Mother    Diabetes Sister    Hypertension Father    Cancer Father        Prostate   Alcohol abuse Father    Cancer Brother        Prostate   Breast cancer Neg Hx    Colon cancer Neg Hx    Colon polyps Neg Hx    Esophageal cancer Neg Hx    Stomach cancer Neg Hx    Rectal cancer Neg Hx     Social History   Socioeconomic History   Marital status: Single    Spouse name: Not on file   Number of children: 2   Years of education: Not on file   Highest education level: 12th grade  Occupational History   Occupation: Unemployed  Tobacco Use   Smoking status: Never   Smokeless tobacco: Never  Vaping Use   Vaping Use: Never used  Substance and Sexual Activity   Alcohol use: No  Drug use: No   Sexual activity: Yes    Birth control/protection: None    Comment: last intercouse two months ago  Other Topics Concern   Not on file  Social History Narrative   Lives with   2 Children: Ages 77 and 2   4 Grandchildren: 2, 3, 4 and 64   Brother      Diet: "Barely eats". Does not exercise due to pain.      03/2011- Currently unemployed. 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 Unsure of last pap.   Unsure of last Td   Unsure of last flu shot   Social Determinants of Health   Financial Resource Strain: Not on file  Food Insecurity: Not on file  Transportation Needs: Not on file  Physical Activity: Not on file  Stress: Not on file  Social Connections: Not on file  Intimate Partner Violence: Not on file    Review of Systems: ROS negative except for what is noted on the assessment and plan.  Vitals:   05/26/21 1510  BP: 120/82  Pulse: 95  Temp: 98.6 F (37 C)  TempSrc: Oral  SpO2: 99%  Weight: 171 lb 3.2 oz (77.7 kg)  Height: 5' (1.524 m)      Physical Exam: Constitutional: well-appearing woman sitting in the chair, in no acute distress HENT: normocephalic atraumatic, mucous membranes moist Eyes: conjunctiva non-erythematous Neck: supple Cardiovascular: regular rate and rhythm, no m/r/g Pulmonary/Chest: normal work of breathing on room air, lungs clear to auscultation bilaterally Abdominal: soft, mild tenderness in epigastric area, non-distended MSK: normal bulk and tone Neurological: alert & oriented x 3, 5/5 strength in bilateral upper and lower extremities, normal gait Skin: warm and dry Psych: normal mood, normal behavior   Assessment & Plan:   See Encounters Tab for problem based charting.  Patient seen with Dr. Pecolia Ades, M.D. [facility], PGY-1 Pager: [phone], Phone: [phone] Date 05/26/2021 Time 5:34 PM

## 2021-05-27 ENCOUNTER — Other Ambulatory Visit (HOSPITAL_COMMUNITY): Payer: Self-pay

## 2021-05-27 LAB — BMP8+ANION GAP
Anion Gap: 17 mmol/L (ref 10.0–18.0)
BUN/Creatinine Ratio: 14 (ref 9–23)
BUN: 14 mg/dL (ref 6–24)
CO2: 27 mmol/L (ref 20–29)
Calcium: 10.5 mg/dL — ABNORMAL HIGH (ref 8.7–10.2)
Chloride: 99 mmol/L (ref 96–106)
Creatinine, Ser: 0.98 mg/dL (ref 0.57–1.00)
Glucose: 132 mg/dL — ABNORMAL HIGH (ref 70–99)
Potassium: 4.3 mmol/L (ref 3.5–5.2)
Sodium: 143 mmol/L (ref 134–144)
eGFR: 67 mL/min/{1.73_m2} (ref >59)

## 2021-05-27 LAB — TSH: TSH: 0.356 u[IU]/mL — ABNORMAL LOW (ref 0.450–4.500)

## 2021-05-27 LAB — T4, FREE: Free T4: 1.29 ng/dL (ref 0.82–1.77)

## 2021-05-28 ENCOUNTER — Other Ambulatory Visit (HOSPITAL_COMMUNITY): Payer: Self-pay

## 2021-06-03 NOTE — Progress Notes (Signed)
Internal Medicine Clinic Attending  I saw and evaluated the patient.  I personally confirmed the key portions of the history and exam documented by Dr. Ariwodo and I reviewed pertinent patient test results.  The assessment, diagnosis, and plan were formulated together and I agree with the documentation in the resident's note.   

## 2021-06-23 ENCOUNTER — Other Ambulatory Visit: Payer: Self-pay | Admitting: Internal Medicine

## 2021-06-23 ENCOUNTER — Other Ambulatory Visit (HOSPITAL_COMMUNITY): Payer: Self-pay

## 2021-06-23 DIAGNOSIS — M797 Fibromyalgia: Secondary | ICD-10-CM

## 2021-06-25 ENCOUNTER — Other Ambulatory Visit (HOSPITAL_COMMUNITY): Payer: Self-pay

## 2021-06-25 ENCOUNTER — Other Ambulatory Visit: Payer: Self-pay | Admitting: Internal Medicine

## 2021-06-25 DIAGNOSIS — M797 Fibromyalgia: Secondary | ICD-10-CM

## 2021-06-25 MED ORDER — DICLOFENAC SODIUM 1 % EX GEL
2.0000 g | Freq: Four times a day (QID) | CUTANEOUS | 0 refills | Status: DC
  Filled 2021-06-25: qty 100, 12d supply, fill #0

## 2021-06-25 MED ORDER — CYCLOBENZAPRINE HCL 10 MG PO TABS
10.0000 mg | ORAL_TABLET | Freq: Two times a day (BID) | ORAL | 0 refills | Status: DC | PRN
  Filled 2021-06-25: qty 60, 30d supply, fill #0
  Filled 2021-07-23: qty 30, 15d supply, fill #1

## 2021-06-26 ENCOUNTER — Other Ambulatory Visit (HOSPITAL_COMMUNITY): Payer: Self-pay

## 2021-06-27 ENCOUNTER — Other Ambulatory Visit (HOSPITAL_COMMUNITY): Payer: Self-pay

## 2021-07-22 ENCOUNTER — Other Ambulatory Visit (HOSPITAL_COMMUNITY): Payer: Self-pay

## 2021-07-22 ENCOUNTER — Ambulatory Visit (HOSPITAL_COMMUNITY)
Admission: EM | Admit: 2021-07-22 | Discharge: 2021-07-22 | Disposition: A | Discharge status: Home / Self Care | Payer: 59 | Attending: Internal Medicine | Admitting: Internal Medicine

## 2021-07-22 ENCOUNTER — Other Ambulatory Visit: Payer: Self-pay

## 2021-07-22 ENCOUNTER — Encounter (HOSPITAL_COMMUNITY): Payer: Self-pay | Admitting: Emergency Medicine

## 2021-07-22 DIAGNOSIS — Z20822 Contact with and (suspected) exposure to covid-19: Secondary | ICD-10-CM | POA: Insufficient documentation

## 2021-07-22 DIAGNOSIS — A084 Viral intestinal infection, unspecified: Secondary | ICD-10-CM | POA: Insufficient documentation

## 2021-07-22 DIAGNOSIS — M797 Fibromyalgia: Secondary | ICD-10-CM | POA: Diagnosis not present

## 2021-07-22 DIAGNOSIS — R519 Headache, unspecified: Secondary | ICD-10-CM | POA: Insufficient documentation

## 2021-07-22 DIAGNOSIS — R112 Nausea with vomiting, unspecified: Secondary | ICD-10-CM | POA: Diagnosis not present

## 2021-07-22 LAB — POC INFLUENZA A AND B ANTIGEN (URGENT CARE ONLY)
INFLUENZA A ANTIGEN, POC: NEGATIVE
INFLUENZA B ANTIGEN, POC: NEGATIVE

## 2021-07-22 MED ORDER — ONDANSETRON 4 MG PO TBDP
8.0000 mg | ORAL_TABLET | Freq: Once | ORAL | Status: AC
  Administered 2021-07-22: 8 mg via ORAL

## 2021-07-22 MED ORDER — ONDANSETRON 4 MG PO TBDP
ORAL_TABLET | ORAL | Status: AC
  Filled 2021-07-22: qty 2

## 2021-07-22 MED ORDER — ONDANSETRON 4 MG PO TBDP
4.0000 mg | ORAL_TABLET | Freq: Three times a day (TID) | ORAL | 0 refills | Status: DC | PRN
Start: 2021-07-22 — End: 2022-05-12
  Filled 2021-07-22: qty 20, 7d supply, fill #0

## 2021-07-22 NOTE — ED Provider Notes (Addendum)
[place]    CSN: [id number] Arrival date & time: 07/22/21  1537      History   Chief Complaint Chief Complaint  Patient presents with   Emesis    HPI Sherry [name] is a 61 y.o. female who presents with onset of vomiting since last night after eating a piece of birthday cake and ginger Ale. She was at a funeral around 1 pm and ate at 3 pm, and had been feeling just fine. She denies URI, cough or diarrhea. Has been chilling and has a HA and body pain feels like her fibromyalgia pain is worse.     Past Medical History:  Diagnosis Date   Acid reflux disease    Anginal pain ([place])    last time 04/18/13 in afternoon; referred to [name]   Anxiety    Cancer [facility])    uterine- had hysterectomy   Chest pain 04/07/2011   Chronic pain    Chronic pain 06/19/2014   Cough 09/25/2011   Depression    Fast heart beat    Fibromyalgia    Headache(784.0) 12/27/2012   History of blood transfusion 1981   childbirth   History of kidney stones    passed   Hypertension    Influenza A 07/08/2017   Left wrist pain 03/10/2013   Migraine    last one was 04/18/13   Sleep apnea    does not use CPAP   Trichomonas contact, treated     Patient Active Problem List   Diagnosis Date Noted   Antibiotic-induced yeast infection 11/21/2020   Patellar pain, left 11/21/2020   Multiple thyroid nodules 08/20/2020   Acute cough 06/18/2020   Concussion 05/14/2020   Acute stress disorder 05/14/2020   Type 2 diabetes mellitus ([place]) 04/22/2020   COVID-19 virus infection 07/18/2019   Screening breast examination 01/26/2019   Cervical cancer screening 12/29/2018   Insomnia 12/29/2018   Colon cancer screening 08/03/2018   Viral infection of lower respiratory system 08/03/2018   Breast cancer screening 08/03/2018   Solitary pulmonary nodule 08/03/2018   Anemia 06/20/2018   Depression 05/09/2018   Sleep apnea 05/18/2013   Calcaneus fracture, left 05/16/2013   Lumbar facet  arthropathy 03/10/2013   Foot pain 07/07/2012   High cholesterol 15/10/6977   Metabolic syndrome 48/06/6551   Obesity 09/02/2011   Chest pain 04/07/2011   Fibromyalgia 03/24/2011   Anxiety 03/24/2011   Sleep disorder 03/24/2011   Hypertension 03/24/2011   GERD 01/05/2007    Past Surgical History:  Procedure Laterality Date   CHOLECYSTECTOMY     2008   ORIF CALCANEOUS FRACTURE Left 05/16/2013   Procedure: OPEN REDUCTION INTERNAL FIXATION (ORIF) LEFT CALCANEOUS FRACTURE;  Surgeon: Rozanna Box, MD;  Location: [place];  Service: Orthopedics;  Laterality: Left;   TOTAL ABDOMINAL HYSTERECTOMY     1986   WISDOM TOOTH EXTRACTION      OB History     Gravida  3   Para  2   Term  2   Preterm      AB  1   Living  2      SAB  1   IAB      Ectopic      Multiple      Live Births  2            Home Medications    Prior to Admission medications   Medication Sig Start Date End Date Taking? Authorizing Provider  ondansetron (ZOFRAN-ODT)  4 MG disintegrating tablet Take 1 tablet (4 mg total) by mouth every 8 (eight) hours as needed for nausea or vomiting. 07/22/21  Yes Rodriguez-Southworth, Sandrea Matte  aspirin EC 81 MG tablet Take 81 mg by mouth daily. Swallow whole.    [provider]  aspirin-acetaminophen-caffeine (EXCEDRIN MIGRAINE) [phone] MG tablet Take 2 tablets by mouth every 6 (six) hours as needed for headache.    [provider]  Cyanocobalamin (VITAMIN B 12 PO) Take by mouth.    [provider]  cyclobenzaprine (FLEXERIL) 10 MG tablet Take 1 tablet (10 mg total) by mouth 2 (two) times daily as needed. 06/25/21 09/23/21  Timothy Lasso, MD  diclofenac Sodium (VOLTAREN) 1 % GEL Apply 2 grams topically 4 (four) times daily. 06/25/21   Timothy Lasso, MD  Ferrous Sulfate (IRON PO) Take by mouth.     [provider]  gabapentin (NEURONTIN) 300 MG capsule Take 1 capsule (300 mg total) by mouth 2 (two) times daily. 12/30/20  07/30/21  Timothy Lasso, MD  glucose blood (CONTOUR NEXT TEST) test strip Check blood sugar before and 2 hours after a meal to see how food affects blood sugars once a day 12/30/20   Timothy Lasso, MD  Microlet Lancets MISC USE 1 TO CHECK BLOOD GLUCOSE BEFORE AND 2 HOURS AFTER A MEAL TO SEE HOW FOOD AFFECTS BLOOD SUGAR ONCE DAILY. 12/30/20   Timothy Lasso, MD  lidocaine (LIDODERM) 5 % Place 1 patch onto the skin daily. Remove & Discard patch within 12 hours or as directed by MD Patient not taking: No sig reported 04/17/20   Raulkar, Clide Deutscher, MD  lisinopril-hydrochlorothiazide (ZESTORETIC) 20-25 MG tablet Take 1 tablet by mouth daily. 05/26/21 08/24/21  Timothy Lasso, MD  MELATONIN PO Take 5 mg by mouth at bedtime.  Patient not taking: Reported on 07/22/2021    [provider]  pantoprazole (PROTONIX) 40 MG tablet Take 1 tablet (40 mg total) by mouth 2 (two) times daily. 05/26/21 08/24/21  Timothy Lasso, MD  promethazine (PHENERGAN) 12.5 MG tablet Take 1 tablet (12.5 mg total) by mouth every 8 (eight) hours as needed for nausea or vomiting. Patient not taking: Reported on 07/22/2021 05/26/21 07/07/21  Timothy Lasso, MD  simvastatin (ZOCOR) 40 MG tablet Take 1 tablet (40 mg total) by mouth at bedtime. 05/26/21 08/24/21  Timothy Lasso, MD    Family History Family History  Problem Relation Age of Onset   Diabetes Mother    Hypertension Mother    Depression Mother    Diabetes Sister    Hypertension Father    Cancer Father        Prostate   Alcohol abuse Father    Cancer Brother        Prostate   Breast cancer Neg Hx    Colon cancer Neg Hx    Colon polyps Neg Hx    Esophageal cancer Neg Hx    Stomach cancer Neg Hx    Rectal cancer Neg Hx     Social History Social History   Tobacco Use   Smoking status: Never   Smokeless tobacco: Never  Vaping Use   Vaping Use: Never used  Substance Use Topics   Alcohol use: No   Drug use: No     Allergies   Cymbalta  [duloxetine hcl], Lyrica [pregabalin], and Gabapentin   Review of Systems Review of Systems  Constitutional:  Positive for appetite change and chills. Negative for diaphoresis, fatigue and fever.  HENT:  Negative for congestion  and sore throat.   Eyes:  Negative for discharge.  Respiratory:  Negative for cough.   Gastrointestinal:  Positive for abdominal pain, nausea and vomiting. Negative for diarrhea.  Genitourinary:  Negative for dysuria.    Physical Exam Triage Vital Signs ED Triage Vitals  Enc Vitals Group     BP 07/22/21 1617 133/81     Pulse Rate 07/22/21 1617 (!) 109     Resp 07/22/21 1617 (!) 22     Temp 07/22/21 1617 100.1 F (37.8 C)     Temp Source 07/22/21 1617 Oral     SpO2 07/22/21 1617 95 %     Weight --      Height --      Head Circumference --      Peak Flow --      Pain Score 07/22/21 1614 10     Pain Loc --      Pain Edu? --      Excl. in [place]? --    No data found.  Updated Vital Signs BP 133/81 (BP Location: Left Arm)    Pulse (!) 109    Temp 100.1 F (37.8 C) (Oral)    Resp (!) 22    SpO2 95%   Visual Acuity Right Eye Distance:   Left Eye Distance:   Bilateral Distance:    Right Eye Near:   Left Eye Near:    Bilateral Near:      Physical Exam Vitals signs and nursing note reviewed.  Constitutional:      General: She is not in acute distress.    Appearance:She is  ill-appearing, but not toxic-appearing or diaphoretic.  HENT:     Head: Normocephalic.     Right Ear: Tympanic membrane, ear canal and external ear normal.     Left Ear: Tympanic membrane, ear canal and external ear normal.     Nose: Nose normal.     Mouth/Throat:     Mouth: Mucous membranes are moist.  Eyes:     General: No scleral icterus.       Right eye: No discharge.        Left eye: No discharge.     Conjunctiva/sclera: Conjunctivae normal.  Neck:     Musculoskeletal: Neck supple. No neck rigidity.  Cardiovascular:     Rate and Rhythm: Normal rate and regular  rhythm.     Heart sounds: No murmur.  Pulmonary:     Effort: Pulmonary effort is normal.     Breath sounds: Normal breath sounds.  Abdominal:     General: Bowel sounds are normal. There is no distension.     Palpations: Abdomen is soft. There is no mass.     Tenderness: There is mild abdominal tenderness on epigastric area. There is no guarding or rebound.     Hernia: No hernia is present.  Musculoskeletal: Normal range of motion.  Lymphadenopathy:     Cervical: No cervical adenopathy.  Skin:    General: Skin is warm and dry.     Coloration: Skin is not jaundiced.     Findings: No rash.  Neurological:     Mental Status: She is alert and oriented to person, place, and time.     Gait: Gait normal.  Psychiatric:        Mood and Affect: Mood normal.        Behavior: Behavior normal.        Thought Content: Thought content normal.        Judgment:  Judgment normal.    UC Treatments / Results  Labs (all labs ordered are listed, but only abnormal results are displayed) Labs Reviewed  SARS CORONAVIRUS 2 (TAT 6-24 HRS)  POC INFLUENZA A AND B ANTIGEN (URGENT CARE ONLY)  Flu A&B neg.   EKG   Radiology No results found.  Procedures Procedures (including critical care time)  Medications Ordered in UC Medications  ondansetron (ZOFRAN-ODT) disintegrating tablet 8 mg (8 mg Oral Given 07/22/21 1644)    Initial Impression / Assessment and Plan / UC Course  I have reviewed the triage vital signs and the nursing notes. Pertinent labs  results that were available during my care of the patient were reviewed by me and considered in my medical decision making (see chart for details). She was given Zofran ODT 8 mg SL and was able to hold 8 oz of water fine. She was sent home to continue Zofran and push hydration. BRAT diet recommended.  Has viral GE   Final Clinical Impressions(s) / UC Diagnoses   Final diagnoses:  Nausea and vomiting, unspecified vomiting type  Viral gastroenteritis    Discharge Instructions   None    ED Prescriptions     Medication Sig Dispense Auth. Provider   ondansetron (ZOFRAN-ODT) 4 MG disintegrating tablet Take 1 tablet (4 mg total) by mouth every 8 (eight) hours as needed for nausea or vomiting. 20 tablet Rodriguez-Southworth, Sunday Spillers, PA-C      PDMP not reviewed this encounter.   Shelby Mattocks, PA-C 07/22/21 1856    Rodriguez-Southworth, Sunday Spillers, PA-C 07/22/21 1857

## 2021-07-22 NOTE — ED Notes (Signed)
Covid and flu swab obtained, labeled and placed in lab

## 2021-07-22 NOTE — ED Triage Notes (Signed)
Vomiting started last night.  Unknown number of vomiting episodes today.    Bilateral leg pain.  Family and patient reports she has fibromyalgia and vomiting / illness is aggravating her underlying diagnosis.  Patient is crying in regards to being thirsty.

## 2021-07-23 ENCOUNTER — Other Ambulatory Visit: Payer: Self-pay | Admitting: Internal Medicine

## 2021-07-23 ENCOUNTER — Other Ambulatory Visit (HOSPITAL_COMMUNITY): Payer: Self-pay

## 2021-07-23 LAB — SARS CORONAVIRUS 2 (TAT 6-24 HRS): SARS Coronavirus 2: NEGATIVE

## 2021-07-23 MED ORDER — GABAPENTIN 300 MG PO CAPS
300.0000 mg | ORAL_CAPSULE | Freq: Two times a day (BID) | ORAL | 2 refills | Status: DC
  Filled 2021-07-23: qty 60, 30d supply, fill #0

## 2021-07-29 ENCOUNTER — Ambulatory Visit (HOSPITAL_COMMUNITY): Payer: 59

## 2021-08-04 ENCOUNTER — Ambulatory Visit (HOSPITAL_COMMUNITY)
Admission: RE | Admit: 2021-08-04 | Discharge: 2021-08-04 | Disposition: A | Discharge status: Home / Self Care | Payer: 59 | Source: Ambulatory Visit | Attending: Internal Medicine | Admitting: Internal Medicine

## 2021-08-04 ENCOUNTER — Other Ambulatory Visit: Payer: Self-pay

## 2021-08-04 DIAGNOSIS — E041 Nontoxic single thyroid nodule: Secondary | ICD-10-CM | POA: Insufficient documentation

## 2021-08-11 ENCOUNTER — Telehealth: Payer: Self-pay | Admitting: Internal Medicine

## 2021-08-11 DIAGNOSIS — E042 Nontoxic multinodular goiter: Secondary | ICD-10-CM

## 2021-08-11 NOTE — Telephone Encounter (Signed)
Provider who saw patient currently on vacation, will follow up results later this afternoon.  ? ?Spoke with Sherry [name] and updated her on her test results.  We discussed that with her thyroid levels as well as ultrasound results we would recommend a radionucleotide uptake scan test.  Patient agreeable to this and recommended she call back with any questions while we await for this test to be performed ?

## 2021-08-11 NOTE — Telephone Encounter (Signed)
Patient requesting a callback about her [place] results. ?

## 2021-08-13 NOTE — Telephone Encounter (Signed)
Requesting ultrasound results, please call back.  ?

## 2021-08-14 ENCOUNTER — Other Ambulatory Visit: Payer: Self-pay | Admitting: Internal Medicine

## 2021-08-14 ENCOUNTER — Other Ambulatory Visit (HOSPITAL_COMMUNITY): Payer: Self-pay

## 2021-08-14 MED ORDER — OZEMPIC (0.25 OR 0.5 MG/DOSE) 2 MG/1.5ML ~~LOC~~ SOPN
0.5000 mg | PEN_INJECTOR | SUBCUTANEOUS | 0 refills | Status: AC
  Filled 2021-08-14: qty 4.5, 84d supply, fill #0
  Filled 2021-08-19: qty 1.5, 28d supply, fill #0

## 2021-08-18 ENCOUNTER — Telehealth: Payer: Self-pay

## 2021-08-18 NOTE — Telephone Encounter (Signed)
Pa  for pt ( OZEMPIC )  came through on cover my meds was submitted  with office notes and labs .. awaiting approval or denial  ?

## 2021-08-19 ENCOUNTER — Other Ambulatory Visit (HOSPITAL_COMMUNITY): Payer: Self-pay

## 2021-08-19 NOTE — Telephone Encounter (Signed)
DECISION : ? ? ? ?Message from Plan ? ?PA Case: [id number], Status: Approved,  ? ?Coverage Starts on: 08/18/2021 12:00 AM, Coverage Ends on: 08/19/2022 12:00 AM.  ? ? ? ? ? ? ? ?( COPY SENT TO PHARMACY ALSO )  ?

## 2021-08-22 ENCOUNTER — Other Ambulatory Visit: Payer: Self-pay | Admitting: Internal Medicine

## 2021-08-22 ENCOUNTER — Telehealth: Payer: Self-pay | Admitting: Internal Medicine

## 2021-08-22 NOTE — Telephone Encounter (Signed)
I called patient, no answer. I called patient's daughter Joesph July who was also present during last office visit. I informed her about the results of the thyroid U/S, nodules were benign. She is to follow U/S imaging in 1 year. Now that thyroid cancer has been ruled out, it is safe to start patient on Ozempic. Ozempic was sent to [facility] and ready for pick up.  ?

## 2021-08-25 ENCOUNTER — Encounter: Payer: Self-pay | Admitting: Internal Medicine

## 2021-08-25 ENCOUNTER — Other Ambulatory Visit: Payer: Self-pay

## 2021-08-25 ENCOUNTER — Other Ambulatory Visit (HOSPITAL_COMMUNITY): Payer: Self-pay

## 2021-08-25 ENCOUNTER — Ambulatory Visit (INDEPENDENT_AMBULATORY_CARE_PROVIDER_SITE_OTHER): Payer: 59 | Admitting: Internal Medicine

## 2021-08-25 VITALS — BP 132/78 | HR 91 | Temp 98.6°F | Ht 60.0 in | Wt 171.3 lb

## 2021-08-25 DIAGNOSIS — M19072 Primary osteoarthritis, left ankle and foot: Secondary | ICD-10-CM | POA: Diagnosis not present

## 2021-08-25 DIAGNOSIS — M797 Fibromyalgia: Secondary | ICD-10-CM

## 2021-08-25 DIAGNOSIS — E11649 Type 2 diabetes mellitus with hypoglycemia without coma: Secondary | ICD-10-CM | POA: Diagnosis not present

## 2021-08-25 DIAGNOSIS — E119 Type 2 diabetes mellitus without complications: Secondary | ICD-10-CM

## 2021-08-25 DIAGNOSIS — M159 Polyosteoarthritis, unspecified: Secondary | ICD-10-CM | POA: Insufficient documentation

## 2021-08-25 DIAGNOSIS — M7521 Bicipital tendinitis, right shoulder: Secondary | ICD-10-CM | POA: Diagnosis not present

## 2021-08-25 DIAGNOSIS — E7800 Pure hypercholesterolemia, unspecified: Secondary | ICD-10-CM

## 2021-08-25 DIAGNOSIS — Z1231 Encounter for screening mammogram for malignant neoplasm of breast: Secondary | ICD-10-CM

## 2021-08-25 DIAGNOSIS — Z0000 Encounter for general adult medical examination without abnormal findings: Secondary | ICD-10-CM

## 2021-08-25 DIAGNOSIS — I10 Essential (primary) hypertension: Secondary | ICD-10-CM

## 2021-08-25 DIAGNOSIS — K219 Gastro-esophageal reflux disease without esophagitis: Secondary | ICD-10-CM

## 2021-08-25 MED ORDER — PANTOPRAZOLE SODIUM 40 MG PO TBEC
40.0000 mg | DELAYED_RELEASE_TABLET | Freq: Two times a day (BID) | ORAL | 0 refills | Status: DC
  Filled 2021-08-25: qty 60, 30d supply, fill #0
  Filled 2021-10-07: qty 30, 30d supply, fill #1
  Filled 2021-11-17: qty 30, 30d supply, fill #2

## 2021-08-25 MED ORDER — NAPROXEN 500 MG PO TABS
500.0000 mg | ORAL_TABLET | Freq: Two times a day (BID) | ORAL | 0 refills | Status: DC
  Filled 2021-08-25: qty 30, 15d supply, fill #0

## 2021-08-25 MED ORDER — CYCLOBENZAPRINE HCL 10 MG PO TABS
10.0000 mg | ORAL_TABLET | Freq: Two times a day (BID) | ORAL | 0 refills | Status: DC | PRN
  Filled 2021-08-25: qty 60, 30d supply, fill #0
  Filled 2021-10-07: qty 30, 15d supply, fill #1

## 2021-08-25 MED ORDER — SIMVASTATIN 40 MG PO TABS
40.0000 mg | ORAL_TABLET | Freq: Every day | ORAL | 0 refills | Status: DC
  Filled 2021-08-25: qty 90, 90d supply, fill #0

## 2021-08-25 MED ORDER — LISINOPRIL-HYDROCHLOROTHIAZIDE 20-25 MG PO TABS
1.0000 | ORAL_TABLET | Freq: Every day | ORAL | 0 refills | Status: DC
  Filled 2021-08-25: qty 30, 30d supply, fill #0
  Filled 2021-10-07: qty 30, 30d supply, fill #1
  Filled 2021-11-17: qty 30, 30d supply, fill #2

## 2021-08-25 NOTE — Assessment & Plan Note (Signed)
Hx of left calcaneal fracture s/p ORIF with Dr Marcelino Scot in 2014. Left ankle imaging in 2019 with severe osteoarthritis of the subtalar joints with possible osseous fusion across the posterior subtalar joint. She reports that left foot OA has been very bothersome for some weeks now. 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 She reports no trauma or falls. She has tried conservative management with voltaren gel, thermotherapy, tylenol and ibuprofen without any significant relief over the past several weeks, and is requesting orthopedics referral toady for advanced therapies. On exam, there is no swelling or inflammation of either feet, no TTP along left achilles tendon, and no TTP along either calcaneus. Given minimal improvement with conservative management and severe OA on imaging, appropriate to proceed with orthopedics referral at this time.  ? ?- Orthopedics referral placed  ?- Advised to continue with voltaren gel, thermotherapy, alternate between tylenol and NSAIDs, and to avoid extended periods of time on feet.  ?

## 2021-08-25 NOTE — Assessment & Plan Note (Signed)
Chronic and stable. BP well controlled at 132/78 today on Lisinopril-HCTZ 20-25 mg daily. She is tolerating medication well without adverse effects. Discussed continuing lifestyle modifications.  ? ?Continue tx as above  ?Continue with lifestyle modifications ?

## 2021-08-25 NOTE — Assessment & Plan Note (Deleted)
Hx of left calcaneal fracture s/p ORIF with Dr Marcelino Scot in 2014. Left ankle imaging in 2019 with severe osteoarthritis of the subtalar joints with possible osseous fusion across the posterior subtalar joint. She reports that left foot OA has been very bothersome for some weeks now. 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 She reports no trauma or falls. She has tried conservative management with voltaren gel, thermotherapy, tylenol and ibuprofen without any significant relief over the past several weeks, and is requesting orthopedics referral toady for advanced therapies. On exam, there is no swelling or inflammation of either feet, no TTP along left achilles tendon, and no TTP along either calcaneus. Given minimal improvement with conservative management and severe OA on imaging, appropriate to proceed with orthopedics referral at this time.  ? ?- Orthopedics referral placed  ?- Advised to continue with voltaren gel, thermotherapy, alternate between tylenol and NSAIDs, and to avoid extended periods of time on feet.  ?

## 2021-08-25 NOTE — Assessment & Plan Note (Signed)
A1c 7.3 three months ago. Plan was to start [place] pending thyroid US for concern for thyroid cancer. Given reassuring thyroid US, Ozempic was prescribed 3 days ago, and she reports that she plans on starting tomorrow. Instructed to start with 0.25 mg weekly for 4 weeks, and then we will follow up in clinic in 1 month. If she is tolerating well, will up-titrate to 0.5 mg weekly at that time.  ?

## 2021-08-25 NOTE — Assessment & Plan Note (Addendum)
Pt complaining of right upper arm pain x 3 days. Complains of sharp pain in overlying right biceps, occasional extending down to her forearm. Pain is intermittent throughout the day. Has tried tylenol with minimal relief. Has not tried thermotherapy or voltaren gel yet. Reports no trauma to the area. 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 Denies sleeping on right side to avoid pain- advised to continue doing this. Vitals stable. On exam, pain with internal and external rotation of right arm but internal > external. She has R biceps tendonitis. Grip strength is good bilaterally. No focal pain in shoulder joint. Strength is 5/5 in all extremities. Will continue with conservative approach at this time.  ?? ?Continue with tylenol, up to 3g/day prn  ?Naproxen 500 BID prn x 14 days prescribed  ?Voltaren gel prn ?Thermotherapy  ?

## 2021-08-25 NOTE — Progress Notes (Signed)
? ?CC: acute visit for left foot pain and right arm pain  ? ?HPI: ? ?Sherry [name] is a 61 y.o. female with a PMHx stated below and presents today for stated above. Please see the Encounters tab for problem-based Assessment & Plan for additional details.  ? ?Past Medical History:  ?Diagnosis Date  ? Acid reflux disease   ? Anginal pain ([place])   ? last time 04/18/13 in afternoon; referred to [name]  ? Anxiety   ? Cancer [facility])   ? uterine- had hysterectomy  ? Chest pain 04/07/2011  ? Chronic pain   ? Chronic pain 06/19/2014  ? Cough 09/25/2011  ? Depression   ? Fast heart beat   ? Fibromyalgia   ? Headache(784.0) 12/27/2012  ? History of blood transfusion 1981  ? childbirth  ? History of kidney stones   ? passed  ? Hypertension   ? Influenza A 07/08/2017  ? Left wrist pain 03/10/2013  ? Migraine   ? last one was 04/18/13  ? Sleep apnea   ? does not use CPAP  ? Trichomonas contact, treated   ? ? ?Current Outpatient Medications on File Prior to Visit  ?Medication Sig Dispense Refill  ? Semaglutide,0.25 or 0.'5MG'$ /DOS, (OZEMPIC, 0.25 OR 0.5 MG/DOSE,) 2 MG/1.5ML SOPN Inject 0.5 mg into the skin once a week. 4.5 mL 0  ? aspirin EC 81 MG tablet Take 81 mg by mouth daily. Swallow whole.    ? aspirin-acetaminophen-caffeine (EXCEDRIN MIGRAINE) 250-250-65 MG tablet Take 2 tablets by mouth every 6 (six) hours as needed for headache.    ? Cyanocobalamin (VITAMIN B 12 PO) Take by mouth.    ? cyclobenzaprine (FLEXERIL) 10 MG tablet Take 1 tablet (10 mg total) by mouth 2 (two) times daily as needed. 90 tablet 0  ? diclofenac Sodium (VOLTAREN) 1 % GEL Apply 2 grams topically 4 (four) times daily. 100 g 0  ? Ferrous Sulfate (IRON PO) Take by mouth.     ? glucose blood (CONTOUR NEXT TEST) test strip Check blood sugar before and 2 hours after a meal to see how food affects blood sugars once a day 100 each 5  ? Microlet Lancets MISC USE 1 TO CHECK BLOOD GLUCOSE BEFORE AND 2 HOURS AFTER A MEAL TO SEE HOW FOOD AFFECTS BLOOD SUGAR  ONCE DAILY. 100 each 5  ? lisinopril-hydrochlorothiazide (ZESTORETIC) 20-25 MG tablet Take 1 tablet by mouth daily. 90 tablet 0  ? MELATONIN PO Take 5 mg by mouth at bedtime.  (Patient not taking: Reported on 07/22/2021)    ? ondansetron (ZOFRAN-ODT) 4 MG disintegrating tablet Take 1 tablet (4 mg total) by mouth every 8 (eight) hours as needed for nausea or vomiting. 20 tablet 0  ? pantoprazole (PROTONIX) 40 MG tablet Take 1 tablet (40 mg total) by mouth 2 (two) times daily. 180 tablet 0  ? promethazine (PHENERGAN) 12.5 MG tablet Take 1 tablet (12.5 mg total) by mouth every 8 (eight) hours as needed for nausea or vomiting. (Patient not taking: Reported on 07/22/2021) 20 tablet 1  ? simvastatin (ZOCOR) 40 MG tablet Take 1 tablet (40 mg total) by mouth at bedtime. 90 tablet 0  ? ?No current facility-administered medications on file prior to visit.  ? ? ?Family History  ?Problem Relation Age of Onset  ? Diabetes Mother   ? Hypertension Mother   ? Depression Mother   ? Diabetes Sister   ? Hypertension Father   ? Cancer Father   ?  Prostate  ? Alcohol abuse Father   ? Cancer Brother   ?     Prostate  ? Breast cancer Neg Hx   ? Colon cancer Neg Hx   ? Colon polyps Neg Hx   ? Esophageal cancer Neg Hx   ? Stomach cancer Neg Hx   ? Rectal cancer Neg Hx   ? ? ?Social History  ? ?Socioeconomic History  ? Marital status: Single  ?  Spouse name: Not on file  ? Number of children: 2  ? Years of education: Not on file  ? Highest education level: 12th grade  ?Occupational History  ? Occupation: Unemployed  ?Tobacco Use  ? Smoking status: Never  ? Smokeless tobacco: Never  ?Vaping Use  ? Vaping Use: Never used  ?Substance and Sexual Activity  ? Alcohol use: No  ? Drug use: No  ? Sexual activity: Yes  ?  Birth control/protection: None  ?  Comment: last intercouse two months ago  ?Other Topics Concern  ? Not on file  ?Social History Narrative  ? Lives with  ? 2 Children: Ages 25 and 54  ? 4 Grandchildren: 2, 3, 4 and 12  ? Brother  ?    ? Diet: "Barely eats". Does not exercise due to pain.  ?   ? 03/2011- Currently unemployed. 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   ? 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 Unsure of last pap.  ? Unsure of last Td  ? Unsure of last flu shot  ? ?Social Determinants of Health  ? ?Financial Resource Strain: Not on file  ?Food Insecurity: Not on file  ?Transportation Needs: Not on file  ?Physical Activity: Not on file  ?Stress: Not on file  ?Social Connections: Not on file  ?Intimate Partner Violence: Not on file  ? ? ?Review of Systems: ?ROS negative except for what is noted on the assessment and plan. ? ?Vitals:  ? 08/25/21 1342  ?BP: 132/78  ?Pulse: 91  ?Temp: 98.6 ?F (37 ?C)  ?TempSrc: Oral  ?SpO2: 100%  ?Weight: 171 lb 4.8 oz (77.7 kg)  ?Height: 5' (1.524 m)  ? ? ?Physical Exam: ?Constitutional: alert, well-appearing, in NAD ?Cardiovascular: RRR, no m/r/g, non-edematous bilateral LE ?Pulmonary/Chest: normal work of breathing on RA, LCTAB ?Abdominal: soft, non-tender to palpation, non-distended ?MSK: Minimal pain with internal and external rotation of right arm. She has R biceps tendonitis. Left arm exam wnl. Grip strength is good bilaterally. Strength is 5/5 in all extremities. Left foot without edema and no overlying TTP over calcaneus or achilles tendon; right foot exam wnl. ?Neurological: A&O x 3, 5/5 strength in bilateral upper and lower extremities ?Skin: warm and dry  ?Psych: normal behavior, normal affect  ? ?Assessment & Plan:  ? ?See Encounters Tab for problem based charting. ? ?Patient discussed with Dr. Daryll Drown ? ?Lajean Manes, MD  ?Internal Medicine Resident, PGY-1 ?[facility]  ?

## 2021-08-25 NOTE — Assessment & Plan Note (Addendum)
Mammogram referral placed ?Has opthalmology appt for routine diabetic eye exam tmrw ?Medications refilled  ? ?

## 2021-08-25 NOTE — Patient Instructions (Addendum)
Left foot: continue with tyleno and naproxen. I have placed orthopedic surgery referral  ? ?Right arm pain: please alternate with tylenol and naproxen, avoid repetitive movements, and use heating pads. You can also use Voltaren gel.  ? ?Diabetes: start Ozempic 0.25 weekly and follow up In 4 weeks  ? ? ?

## 2021-08-26 ENCOUNTER — Other Ambulatory Visit: Payer: Self-pay | Admitting: Internal Medicine

## 2021-08-26 ENCOUNTER — Other Ambulatory Visit (HOSPITAL_COMMUNITY): Payer: Self-pay

## 2021-08-26 DIAGNOSIS — E11649 Type 2 diabetes mellitus with hypoglycemia without coma: Secondary | ICD-10-CM

## 2021-08-27 ENCOUNTER — Other Ambulatory Visit (HOSPITAL_COMMUNITY): Payer: Self-pay

## 2021-08-29 ENCOUNTER — Other Ambulatory Visit: Payer: Self-pay | Admitting: Internal Medicine

## 2021-08-29 ENCOUNTER — Other Ambulatory Visit (HOSPITAL_COMMUNITY): Payer: Self-pay

## 2021-08-29 ENCOUNTER — Telehealth: Payer: Self-pay | Admitting: *Deleted

## 2021-08-29 ENCOUNTER — Telehealth: Payer: Self-pay

## 2021-08-29 DIAGNOSIS — E11649 Type 2 diabetes mellitus with hypoglycemia without coma: Secondary | ICD-10-CM

## 2021-08-29 NOTE — Telephone Encounter (Signed)
Pt is requesting a call back .. she stated that the meds that she got for her hand pain on 08/25/21 ?

## 2021-08-29 NOTE — Telephone Encounter (Signed)
Call from patient stated that the medication that she received for her hand pain is not helping is taking the Naprosyn and using the Voltaren without relief.  States that her thumb is swollen know.  Checking on referral to the hand specialist that she stated was in process.  No Referral was seen in chart.   Patient wants to know what she can take for the hand pain.  Referral are in process for the foot pain. ?

## 2021-09-01 ENCOUNTER — Other Ambulatory Visit (HOSPITAL_COMMUNITY): Payer: Self-pay

## 2021-09-01 ENCOUNTER — Other Ambulatory Visit: Payer: Self-pay | Admitting: Internal Medicine

## 2021-09-01 DIAGNOSIS — M7521 Bicipital tendinitis, right shoulder: Secondary | ICD-10-CM

## 2021-09-01 MED ORDER — MELOXICAM 7.5 MG PO TABS
15.0000 mg | ORAL_TABLET | Freq: Every day | ORAL | 0 refills | Status: DC
  Filled 2021-09-01: qty 60, 30d supply, fill #0

## 2021-09-01 MED ORDER — PROMETHAZINE HCL 12.5 MG PO TABS
12.5000 mg | ORAL_TABLET | Freq: Three times a day (TID) | ORAL | 1 refills | Status: DC | PRN
  Filled 2021-09-01: qty 20, 7d supply, fill #0
  Filled 2021-10-07: qty 20, 7d supply, fill #1

## 2021-09-02 NOTE — Progress Notes (Signed)
Internal Medicine Clinic Attending  Case discussed with Dr. Patel at the time of the visit.  We reviewed the resident's history and exam and pertinent patient test results.  I agree with the assessment, diagnosis, and plan of care documented in the resident's note.  

## 2021-09-09 ENCOUNTER — Other Ambulatory Visit: Payer: Self-pay | Admitting: Internal Medicine

## 2021-09-09 DIAGNOSIS — M19072 Primary osteoarthritis, left ankle and foot: Secondary | ICD-10-CM

## 2021-09-09 DIAGNOSIS — Z5900 Homelessness unspecified: Secondary | ICD-10-CM

## 2021-09-09 NOTE — Progress Notes (Signed)
Spoke with [place] regarding issues with homelessness and orthopedic surgery referral. Unfortunately she is unable to return to Dr. Carlean Jews office due to an outstanding balance. Review of the medical record shows she has severe OA of her left ankle with possible osseous fusion across the posterior subtalar joint. Unclear if there is surgical intervention that can be offered for this, but given unstable housing she's not an ideal candidate for surgery at the moment regardless. I have placed referral to podiatry to see if they can assist with injections or orthotics or other non surgical interventions in the meantime. Also placed referral for SW.  ?

## 2021-09-10 ENCOUNTER — Encounter (HOSPITAL_COMMUNITY): Payer: 59

## 2021-09-10 LAB — HM DIABETES EYE EXAM

## 2021-09-11 ENCOUNTER — Ambulatory Visit (HOSPITAL_COMMUNITY)
Admission: RE | Admit: 2021-09-11 | Discharge: 2021-09-11 | Disposition: A | Discharge status: Home / Self Care | Payer: 59 | Source: Ambulatory Visit | Attending: Internal Medicine | Admitting: Internal Medicine

## 2021-09-11 ENCOUNTER — Encounter (HOSPITAL_COMMUNITY): Payer: 59

## 2021-09-11 ENCOUNTER — Ambulatory Visit: Payer: Self-pay | Admitting: Licensed Clinical Social Worker

## 2021-09-11 ENCOUNTER — Encounter: Payer: Self-pay | Admitting: Internal Medicine

## 2021-09-11 ENCOUNTER — Ambulatory Visit (INDEPENDENT_AMBULATORY_CARE_PROVIDER_SITE_OTHER): Payer: 59 | Admitting: Internal Medicine

## 2021-09-11 ENCOUNTER — Encounter (HOSPITAL_COMMUNITY)
Admission: RE | Admit: 2021-09-11 | Discharge: 2021-09-11 | Disposition: A | Discharge status: Home / Self Care | Payer: 59 | Source: Ambulatory Visit | Attending: Internal Medicine | Admitting: Internal Medicine

## 2021-09-11 ENCOUNTER — Other Ambulatory Visit: Payer: Self-pay

## 2021-09-11 VITALS — BP 135/77 | HR 99 | Temp 98.3°F | Ht 60.0 in | Wt 169.6 lb

## 2021-09-11 DIAGNOSIS — M19072 Primary osteoarthritis, left ankle and foot: Secondary | ICD-10-CM

## 2021-09-11 DIAGNOSIS — M79641 Pain in right hand: Secondary | ICD-10-CM | POA: Diagnosis not present

## 2021-09-11 DIAGNOSIS — E042 Nontoxic multinodular goiter: Secondary | ICD-10-CM | POA: Insufficient documentation

## 2021-09-11 DIAGNOSIS — M79642 Pain in left hand: Secondary | ICD-10-CM

## 2021-09-11 DIAGNOSIS — M79672 Pain in left foot: Secondary | ICD-10-CM | POA: Diagnosis not present

## 2021-09-11 DIAGNOSIS — M2550 Pain in unspecified joint: Secondary | ICD-10-CM

## 2021-09-11 MED ORDER — SODIUM IODIDE I-123 7.4 MBQ CAPS
391.0000 | ORAL_CAPSULE | Freq: Once | ORAL | Status: AC
  Administered 2021-09-11: 391 via ORAL

## 2021-09-11 NOTE — Progress Notes (Signed)
? ?  CC: right hand pain, left foot pain follow up  ? ?HPI: ? ?[name] is a 61 y.o. female with PMHx as stated below presenting for evaluation of ongoing foot pain and right hand pain. Please see problem based charting for complete assessment and plan. ? ?Past Medical History:  ?Diagnosis Date  ? Acid reflux disease   ? Anginal pain (Sherry [name])   ? last time 04/18/13 in afternoon; referred to Dr. Dorris [name]  ? Anxiety   ? Cancer [facility])   ? uterine- had hysterectomy  ? Chest pain 04/07/2011  ? Chronic pain   ? Chronic pain 06/19/2014  ? Cough 09/25/2011  ? Depression   ? Fast heart beat   ? Fibromyalgia   ? Headache(784.0) 12/27/2012  ? History of blood transfusion 1981  ? childbirth  ? History of kidney stones   ? passed  ? Hypertension   ? Influenza A 07/08/2017  ? Left wrist pain 03/10/2013  ? Migraine   ? last one was 04/18/13  ? Sleep apnea   ? does not use CPAP  ? Trichomonas contact, treated   ? ?Review of Systems:  Negative except as stated in HPI. ? ?Physical Exam: ? ?Vitals:  ? 09/11/21 0858  ?BP: 135/77  ?Pulse: 99  ?Temp: 98.3 ?F (36.8 ?C)  ?TempSrc: Oral  ?SpO2: 98%  ?Weight: 169 lb 9.6 oz (76.9 kg)  ?Height: 5' (1.524 m)  ? ?Physical Exam  ?Constitutional: Appears well-developed and well-nourished. No distress.  ?HENT: Normocephalic and atraumatic, EOMI, conjunctiva normal, moist mucous membranes ?Cardiovascular: Normal rate, regular rhythm, S1 and S2 present, no murmurs, rubs, gallops.  Distal pulses intact ?Respiratory: Effort is normal on room air.  ?Musculoskeletal: Normal bulk and tone.  No peripheral edema noted. ?R Hand: No obvious deformity noted; does have mild swelling at the base of dorsal aspect of right thumb without thenar muscle abnormalities. No erythema or bruising. TTP at the PIP of digits. Full ROM of digits and wrist with strength and sensation intact.  ?L foot: No obvious deformity noted; brace in place. Neurovascular intact.  ?Neurological: Is alert and oriented x4, no apparent  focal deficits noted. ?Skin: Warm and dry.  No rash, erythema, lesions noted. ?Psychiatric: Normal mood and affect.  ? ?Assessment & Plan:  ? ?See Encounters Tab for problem based charting. ? ?Patient discussed with Dr. Philipp [name] ? ?

## 2021-09-11 NOTE — Patient Instructions (Signed)
Visit Information ? ?Instructions: patient will work with SW to address concerns related to housing. ? ?Patient was given the following information about care management and care coordination services today, agreed to services, and gave verbal consent: 1.care management/care coordination services include personalized support from designated clinical staff supervised by their physician, including individualized plan of care and coordination with other care providers 2. 24/7 contact phone numbers for assistance for urgent and routine care needs. 3. The patient may stop care management/care coordination services at any time by phone call to the office staff. ? ?Patient verbalizes understanding of instructions and care plan provided today and agrees to view in [place]. Active MyChart status confirmed with patient.   ? ?The care management team will reach out to the patient again over the next 30 days.  ? ?Milus Height, BSW  ?Social Worker ?[facility] Care Management  ?[phone] ?  ? ?  ?

## 2021-09-11 NOTE — Patient Instructions (Addendum)
Ms Vendetta Pittinger, ? ?It was a pleasure seeing you in clinic. Today we discussed:  ? ?Joint pains: ?I am checking some lab work for rheumatoid arthritis. I have also ordered x-rays of both hands and feet to check for arthritis. I will call you with the lab results. ?You may continue to take meloxicam as needed for pain. Hand splint as advised ? ?Thyroid nodules: ?We will follow up on the results from your nuclear scan. I will call you  ? ?If you have any questions or concerns, please call our clinic at [phone] between 9am-5pm and after hours call [phone] and ask for the internal medicine resident on call. If you feel you are having a medical emergency please call 911.  ? ?Thank you, we look forward to helping you remain healthy! ? ? ?

## 2021-09-11 NOTE — Chronic Care Management (AMB) (Signed)
?  Care Management  ? ?Social Work Visit Note ? ?09/11/2021 ?Name: Sherry [name] MRN: [id number] DOB: 1960-10-31 ? ?Sherry [name] is a 61 y.o. year old female who sees Timothy Lasso, MD for primary care. The care management team was consulted for assistance with care management and care coordination needs related to Financial Difficulties related to medical needs   ? ?Patient was given the following information about care management and care coordination services today, agreed to services, and gave verbal consent: 1.care management/care coordination services include personalized support from designated clinical staff supervised by their physician, including individualized plan of care and coordination with other care providers 2. 24/7 contact phone numbers for assistance for urgent and routine care needs. 3. The patient may stop care management/care coordination services at any time by phone call to the office staff. ? ?Engaged with patient by telephone for initial visit in response to provider referral for social work chronic care management and care coordination services. ? ?Assessment: Review of patient history, allergies, and health status during evaluation of patient need for care management/care coordination services.   ? ?Interventions:  ?Patient interviewed and appropriate assessments performed ?Collaborated with clinical team regarding patient needs  ?SW seen patient face to face with Dr. Marva Panda. Patient in need of an Orthopedic Trauma specialist for her left hand. SW confirmed patient has an outstanding balance of [id number] from 2016 to now from Dr. Marcelino Scot office. Debt is in collection. SW contact agency to see if a payment plan was arranged if they would see patient. Management declined informing patient would need to pay balance in full before an appointment will be scheduled. SW contacted a Scientist, forensic of agencies. Agencies will not assist with debt that is in collection.  ?Patient advise she is  homeless and couch surfing. Patient alternats staying with family and friends. SW gave patient information regarding housing authority. Patient stated she was previously on the list, but may no longer be. Patient agreed to contact agency.  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 Patient will now have income.  ? ?SDOH (Social Determinants of Health) assessments performed: Yes ?   ? ?Plan:  ?patient will work with BSW to address needs related to Housing barriers ?Social Worker will follow up with patient within 30 days. .  ? ?Milus Height, BSW  ?Social Worker ?[facility] Care Management  ?[phone] ?  ? ? ? ? ? ? ? ? ? ? ? ? ? ? ?

## 2021-09-12 ENCOUNTER — Encounter (HOSPITAL_COMMUNITY)
Admission: RE | Admit: 2021-09-12 | Discharge: 2021-09-12 | Disposition: A | Discharge status: Home / Self Care | Payer: 59 | Source: Ambulatory Visit | Attending: Internal Medicine | Admitting: Internal Medicine

## 2021-09-14 DIAGNOSIS — M79641 Pain in right hand: Secondary | ICD-10-CM | POA: Insufficient documentation

## 2021-09-14 NOTE — Assessment & Plan Note (Addendum)
F/u nuclear thyroid scan results ? ? ?ADDENDUM: ?Nuclear thyroid scans without abnormal uptake. Results discussed with patient ?

## 2021-09-14 NOTE — Assessment & Plan Note (Signed)
Patient was assessed on 3/20 for left foot pain on 3/20 unrelieved by conservative management. She was referred to orthopedic surgery given her history of left calcaneal fracture s/p ORIF with Dr Marcelino Scot in 2014. However, due to her unstable housing situation and previous outstanding balance at that orthopedic surgeon, patient unable to return to that office. Given her unstable housing situation, patient is not an ideal candidate for surgery at the moment.  ?Referral to podiatry placed at prior visit to assist with orthotics or other nonsurgical interventions while patient recommended to continue with conservative management. 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 On examination, patient has a brace on. No TTP of joint spaces.  ? ?Plan: ?Follow up referral to podiatry - would benefit from orthotics ?Continue with conservative management: voltaren gel, heat therapy and alternating between tylenol and NSAIDs ? ?

## 2021-09-14 NOTE — Assessment & Plan Note (Addendum)
Patient reports right hand pain that started approximately one week prior to last office visit. She notes associated swelling in the hand, mostly at the base of the thumb. She works in [employer] and attributes her symptoms to frequent mopping. She denies any direct trauma to the area. ?On examination, has some edema at the dorsal aspect of base of right thumb without any bruising or erythema. Has some TTP at the PIP joints of all digits without MCP tenderness to palpation. No thenar muscle tenderness. Has some mild pain with movement of the thumb; however, no restriction in ROM noted.  ?Given polyarticular nature of joint pain with pain in the left foot in addition to right hand, studies to evaluate for rheumatoid arthritis obtained including ESR, CRP, RF and ANA. ESR, CRP and RF wnl thus far. Patient also advised for x-rays of hands and feet which are currently pending. ? ?Plan: ?F/u bilateral hand and feet x-rays ?F/u ANA ?Continue conservative therapy with tylenol/NSAIDs as needed and thermotherapy ?Patient given splint for support  ?

## 2021-09-15 LAB — SEDIMENTATION RATE: Sed Rate: 34 mm/hr (ref 0–40)

## 2021-09-15 LAB — C-REACTIVE PROTEIN: CRP: 8 mg/L (ref 0–10)

## 2021-09-15 LAB — RHEUMATOID FACTOR: Rheumatoid fact SerPl-aCnc: <10 IU/mL (ref <14.0)

## 2021-09-15 LAB — ANTINUCLEAR ANTIBODIES, IFA: ANA Titer 1: NEGATIVE

## 2021-09-16 NOTE — Progress Notes (Signed)
Internal Medicine Clinic Attending ° °Case discussed with Dr. Aslam  At the time of the visit.  We reviewed the resident’s history and exam and pertinent patient test results.  I agree with the assessment, diagnosis, and plan of care documented in the resident’s note.  °

## 2021-09-23 ENCOUNTER — Ambulatory Visit (INDEPENDENT_AMBULATORY_CARE_PROVIDER_SITE_OTHER): Payer: 59

## 2021-09-23 ENCOUNTER — Ambulatory Visit: Payer: 59 | Admitting: Podiatry

## 2021-09-23 ENCOUNTER — Ambulatory Visit: Payer: 59

## 2021-09-23 ENCOUNTER — Other Ambulatory Visit (HOSPITAL_COMMUNITY): Payer: Self-pay

## 2021-09-23 DIAGNOSIS — M79672 Pain in left foot: Secondary | ICD-10-CM

## 2021-09-23 DIAGNOSIS — M19072 Primary osteoarthritis, left ankle and foot: Secondary | ICD-10-CM

## 2021-09-23 DIAGNOSIS — R269 Unspecified abnormalities of gait and mobility: Secondary | ICD-10-CM

## 2021-09-23 DIAGNOSIS — R2681 Unsteadiness on feet: Secondary | ICD-10-CM

## 2021-09-23 DIAGNOSIS — M7752 Other enthesopathy of left foot: Secondary | ICD-10-CM

## 2021-09-23 DIAGNOSIS — S92002S Unspecified fracture of left calcaneus, sequela: Secondary | ICD-10-CM

## 2021-09-23 DIAGNOSIS — M25572 Pain in left ankle and joints of left foot: Secondary | ICD-10-CM

## 2021-09-23 MED ORDER — TRIAMCINOLONE ACETONIDE 10 MG/ML IJ SUSP: 10.0000 mg | Freq: Once | INTRAMUSCULAR | Status: DC

## 2021-09-23 MED ORDER — MELOXICAM 7.5 MG PO TABS
7.5000 mg | ORAL_TABLET | Freq: Every day | ORAL | 0 refills | Status: DC | PRN
  Filled 2021-09-23 – 2021-10-07 (×2): qty 30, 30d supply, fill #0

## 2021-09-23 NOTE — Progress Notes (Signed)
SITUATION ?Patient Name:  Sherry [name] ?MRN:   [id number] ?Reason for Visit: Evaluation for [facility] AFO ? ?Patient Report: ?Chief Complaint:   Lateral ankle pain due to gait abnormality ?Nature of Discomfort/Pain:  Ambulatory ?Location:    left lower extremity ?Onset & Duration:   Gradual and Present longer than 3 months ?Course:    gradually worsening ?Aggravating or Alleviating Factors: Ambulation ? ?OBJECTIVE DATA & MEASUREMENTS ?Prognosis:    Good ?Duration of use:   5 years ? ?Diagnosis: ?  ICD-10-CM   ?1. Abnormality of gait  R26.9   ?  ? ? ?GOALS, NECESSITIES, & JUSTIFICATIONS ?Recommended Device: [employer] ?Color:    Black ?Closure:   Laces ? ?Laterality HCPCS Code Description Justification  ?left [id number] Plastic orthosis, custom molded from a model of the patient, custom fabricated, includes casting and cast preparation. Necessary to provide triplanar support to the foot/ankle complex  ?left [id number] Plastic modification, Varus / Valgus control    ?left L2330 Addition to lower extremity, lacer molded to patient model Necessary to ensure secure hold of orthosis to patient's limb  ?left L2820 Addition to lower extremity orthosis, soft interface for molded plastic below knee section Necessary to relieve pressure on bony prominences  ? ? ?I certify that Sherry [name] qualifies for and will benefit from an ankle foot orthosis used during ambulation based on meeting all of the following criteria;  ? ?The patient is: ?- Ambulatory, and ?- Has weakness or deformity of the foot and ankle, and ?- Requires stabilization for medical reasons, and ?- Has the potential to benefit functionally ? ?The patient?s medical record contains sufficient documentation of the patients medical condition to substantiate the necessity for the type and quantity of the items ordered. ? ?The goals of this therapy: ?- Improve Mobility ?- Improve Lower Extremity Stability ?- Decrease Pain ?- Facilitate Soft Tissue  Healing ?- Facilitate Immobilization, healing and treatment of an injury ? ?Necessity of Ankle Foot Orthotic molded to patient model: ?A custom (vs. prefabricated) ankle foot orthosis has been prescribed based on the following criteria which are specific to the condition of this patient; ?- The patient could not be fit with a prefabricated AFO ?- The condition necessitating the orthosis is expected to be permanent or of longstanding duration (more than 6 months) ?- There is need to control the ankle or foot in more than one plane ?- The patient has a documented neurological, circulatory, or orthopedic condition that requires custom fabrication over a model to prevent tissue injury ?- The patient has a healing fracture that lacks normal anatomical integrity or anthropometric proportions ? ?I hereby certify that the ankle foot orthotic described above is a rigid or semi-rigid device which is used for the purpose of supporting a weak or deformed body member or restricting or eliminating motion in a diseased or injured part of the body. It is designed to provide support and counterforce on the limb or body part that is being braced. In my opinion, the custom molded ankle foot orthosis is both reasonable and necessary in reference to accepted standards of medical practice in the treatment  ?of the patient condition and rehabilitation. ? ?ACTIONS PERFORMED ?Patient was evaluated and casted for [place] AFO via [place]. Procedure was explained to patient. Patient tolerated procedure. patient selected device color and closure method.  ? ?PLAN ?Patient to return in four to six weeks for fitting and delivery of device. Plan of care was explained to and agreed upon  by patient. All questions were answered and concerns addressed. ? ? ? ? ? ?

## 2021-09-23 NOTE — Patient Instructions (Signed)

## 2021-09-24 ENCOUNTER — Telehealth: Payer: Self-pay | Admitting: *Deleted

## 2021-09-24 NOTE — Chronic Care Management (AMB) (Signed)
?  Care Management  ? ?Note ? ?09/24/2021 ?Name: Sherry [name] MRN: [id number] DOB: 1961-01-25 ? ?Sherry [name] is a 61 y.o. year old female who is a primary care patient of Timothy Lasso, MD and is actively engaged with the care management team. I reached out to [name] by phone today to assist with scheduling an initial visit with the RN Case Manager ? ?Follow up plan: ?Unsuccessful telephone outreach attempt made. A HIPAA compliant phone message was left for the patient providing contact information and requesting a return call.  ?The care management team will reach out to the patient again over the next 7 days.  ?If patient returns call to provider office, please advise to call [facility]  at [phone]. ?Laverda Sorenson  ?Care Guide, Embedded Care Coordination ?  Care Management  ?Direct Dial: [phone] ? ?

## 2021-09-28 NOTE — Progress Notes (Signed)
Subjective:  ? ?Patient ID: Sherry [name], female   DOB: 61 y.o.   MRN: [id number]  ? ?HPI ?61 year old female presents the office with concerns of left foot pain.  She states that she had a previous calcaneal fracture in 2016.  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  Since then she has developed discomfort in the lateral aspect foot and ankle.  No new injuries.  She has an over-the-counter brace which has not been helping.  She is asked about other types of bracing options to help with the pain.  She feels that when she walks she walks on the outside aspect of her foot.  She is diabetic and her last A1c was 7.3. ? ? ?Review of Systems  ?All other systems reviewed and are negative. ? ?Past Medical History:  ?Diagnosis Date  ? Acid reflux disease   ? Anginal pain ([place])   ? last time 04/18/13 in afternoon; referred to [name]  ? Anxiety   ? Cancer [facility])   ? uterine- had hysterectomy  ? Chest pain 04/07/2011  ? Chronic pain   ? Chronic pain 06/19/2014  ? Cough 09/25/2011  ? Depression   ? Fast heart beat   ? Fibromyalgia   ? Headache(784.0) 12/27/2012  ? History of blood transfusion 1981  ? childbirth  ? History of kidney stones   ? passed  ? Hypertension   ? Influenza A 07/08/2017  ? Left wrist pain 03/10/2013  ? Migraine   ? last one was 04/18/13  ? Sleep apnea   ? does not use CPAP  ? Trichomonas contact, treated   ? ? ?Past Surgical History:  ?Procedure Laterality Date  ? CHOLECYSTECTOMY    ? 2008  ? ORIF CALCANEOUS FRACTURE Left 05/16/2013  ? Procedure: OPEN REDUCTION INTERNAL FIXATION (ORIF) LEFT CALCANEOUS FRACTURE;  Surgeon: Rozanna Box, MD;  Location: [facility];  Service: Orthopedics;  Laterality: Left;  ? TOTAL ABDOMINAL HYSTERECTOMY    ? 1986  ? WISDOM TOOTH EXTRACTION    ? ? ? ?Current Outpatient Medications:  ?  meloxicam (MOBIC) 7.5 MG tablet, Take 1 tablet (7.5 mg total) by mouth daily as needed for pain., Disp: 30 tablet, Rfl: 0 ?  Semaglutide,0.25 or 0.'5MG'$ /DOS, (OZEMPIC,  0.25 OR 0.5 MG/DOSE,) 2 MG/1.5ML SOPN, Inject 0.5 mg into the skin once a week., Disp: 4.5 mL, Rfl: 0 ?  aspirin EC 81 MG tablet, Take 81 mg by mouth daily. Swallow whole., Disp: , Rfl:  ?  aspirin-acetaminophen-caffeine (EXCEDRIN MIGRAINE) 250-250-65 MG tablet, Take 2 tablets by mouth every 6 (six) hours as needed for headache., Disp: , Rfl:  ?  Cyanocobalamin (VITAMIN B 12 PO), Take by mouth., Disp: , Rfl:  ?  cyclobenzaprine (FLEXERIL) 10 MG tablet, Take 1 tablet (10 mg total) by mouth 2 (two) times daily as needed., Disp: 90 tablet, Rfl: 0 ?  diclofenac Sodium (VOLTAREN) 1 % GEL, Apply 2 grams topically 4 (four) times daily., Disp: 100 g, Rfl: 0 ?  Ferrous Sulfate (IRON PO), Take by mouth. , Disp: , Rfl:  ?  glucose blood (CONTOUR NEXT TEST) test strip, Check blood sugar before and 2 hours after a meal to see how food affects blood sugars once a day, Disp: 100 each, Rfl: 5 ?  Microlet Lancets MISC, USE 1 TO CHECK BLOOD GLUCOSE BEFORE AND 2 HOURS AFTER A MEAL TO SEE HOW FOOD AFFECTS BLOOD SUGAR ONCE DAILY., Disp: 100 each, Rfl: 5 ?  lisinopril-hydrochlorothiazide (ZESTORETIC) 20-25 MG tablet, Take 1 tablet by mouth daily., Disp: 90 tablet, Rfl: 0 ?  MELATONIN PO, Take 5 mg by mouth at bedtime.  (Patient not taking: Reported on 07/22/2021), Disp: , Rfl:  ?  meloxicam (MOBIC) 7.5 MG tablet, Take 2 tablets (15 mg total) by mouth daily., Disp: 60 tablet, Rfl: 0 ?  ondansetron (ZOFRAN-ODT) 4 MG disintegrating tablet, Take 1 tablet (4 mg total) by mouth every 8 (eight) hours as needed for nausea or vomiting., Disp: 20 tablet, Rfl: 0 ?  pantoprazole (PROTONIX) 40 MG tablet, Take 1 tablet (40 mg total) by mouth 2 (two) times daily., Disp: 180 tablet, Rfl: 0 ?  promethazine (PHENERGAN) 12.5 MG tablet, Take 1 tablet (12.5 mg total) by mouth every 8 (eight) hours as needed for nausea or vomiting., Disp: 20 tablet, Rfl: 1 ?  simvastatin (ZOCOR) 40 MG tablet, Take 1 tablet (40 mg total) by mouth at bedtime., Disp: 90 tablet,  Rfl: 0 ? ?Current Facility-Administered Medications:  ?  triamcinolone acetonide (KENALOG) 10 MG/ML injection 10 mg, 10 mg, Other, Once, Trula Slade, DPM ? ?Allergies  ?Allergen Reactions  ? Cymbalta [Duloxetine Hcl]   ?  Nausea, vomiting  ? Lyrica [Pregabalin] Nausea And Vomiting  ? Gabapentin   ?  Weight gain  ? ? ? ? ? ?   ?Objective:  ?Physical Exam  ?General: AAO x3, NAD ? ?Dermatological: Incision from the prior surgery is well-healed.  There is no open lesions. ? ?Vascular: Dorsalis Pedis artery and Posterior Tibial artery pedal pulses are 2/4 bilateral with immedate capillary fill time. There is no pain with calf compression, swelling, warmth, erythema.  ? ?Neruologic: Grossly intact via light touch bilateral.  ? ?Musculoskeletal: There is decreased range of motion of the subtalar joint on the left side.  Tenderness palpation on sinus tarsi.  Mild discomfort of the ankle joint on the anterior ankle joint line but there is no pain or crepitation with ankle joint range of motion.  There is mild edema present lateral aspect along the sinus tarsi.  There is no tenderness to the metatarsals or digits.  Muscular strength 5/5 in all groups tested bilateral. ? ?Gait: Unassisted, Nonantalgic.  ? ? ?   ?Assessment:  ? 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 ?   ?Plan:  ?-Treatment options discussed including all alternatives, risks, and complications ?-Etiology of symptoms were discussed ?-X-rays were obtained and reviewed with the patient.  3 views of the foot and ankle were obtained.  There is no subacute fracture.  Arthritic changes present the subtalar joint with healed fracture of the calcaneus.  Likely chronic changes present the distal fibula.  No evidence of acute fracture. ?-Discussed with conservative versus surgical options.  Ultimately she may need to proceed with subtalar joint arthrodesis however she is not interested at this time.  Discussed different bracing, shoe options and  will have her follow-up with our orthotist, Aaron Edelman for this.  ?-Steroid injection performed today as well to the sinus tarsi.  The skin was prepped with Betadine, alcohol and a mixture of 1 cc Kenalog 10, 0.5 cc of Marcaine plain, 0.5 cc of lidocaine plain was infiltrated into the sinus tarsi without complications.  Postinjection care discussed. ? ?Trula Slade DPM ? ?

## 2021-10-01 NOTE — Chronic Care Management (AMB) (Signed)
?  Care Management  ? ?Note ? ?10/01/2021 ?Name: KEIASHA DIEP MRN: [id number] DOB: 11/21/1960 ? ?ADAMAE RICKLEFS is a 61 y.o. year old female who is a primary care patient of Timothy Lasso, MD and is actively engaged with the care management team. I reached out to [name] by phone today to assist with scheduling an initial visit with the RN Case Manager ? ?Follow up plan: ?Telephone appointment with care management team member scheduled for:10/06/21 ? ?Laverda Sorenson  ?Care Guide, Embedded Care Coordination ?[facility]  Care Management  ?Direct Dial: [phone] ? ?

## 2021-10-02 ENCOUNTER — Other Ambulatory Visit (HOSPITAL_COMMUNITY): Payer: Self-pay

## 2021-10-06 ENCOUNTER — Telehealth: Payer: Self-pay

## 2021-10-06 ENCOUNTER — Telehealth: Payer: 59

## 2021-10-06 NOTE — Telephone Encounter (Signed)
?  Care Management  ? ?Outreach Note ? ?10/06/2021 ?Name: Sherry [name] MRN: [id number] DOB: Nov 03, 1960 ? ?Referred by: Timothy Lasso, MD ?Reason for referral : No chief complaint on file. ? ? ?A second unsuccessful telephone outreach was attempted today. The patient was referred to the case management team for assistance with care management and care coordination.  ? ?Follow Up Plan: If patient returns call to provider office, please advise to call [facility] at [phone] to reschedule appointment. ? ?Johnney Killian, RN, BSN, CCM ?Care Management Coordinator ?[facility] ?Phone: [phone]/CCE: [phone]  ?

## 2021-10-07 ENCOUNTER — Other Ambulatory Visit (HOSPITAL_COMMUNITY): Payer: Self-pay

## 2021-10-07 ENCOUNTER — Other Ambulatory Visit: Payer: Self-pay | Admitting: Podiatry

## 2021-10-07 ENCOUNTER — Other Ambulatory Visit: Payer: Self-pay | Admitting: Internal Medicine

## 2021-10-07 DIAGNOSIS — M7752 Other enthesopathy of left foot: Secondary | ICD-10-CM

## 2021-10-07 DIAGNOSIS — M797 Fibromyalgia: Secondary | ICD-10-CM

## 2021-10-07 DIAGNOSIS — S92002S Unspecified fracture of left calcaneus, sequela: Secondary | ICD-10-CM

## 2021-10-07 MED ORDER — DICLOFENAC SODIUM 1 % EX GEL
2.0000 g | Freq: Four times a day (QID) | CUTANEOUS | 0 refills | Status: DC
  Filled 2021-10-07 – 2021-11-17 (×2): qty 100, 12d supply, fill #0

## 2021-10-13 ENCOUNTER — Telehealth: Payer: Self-pay | Admitting: *Deleted

## 2021-10-13 NOTE — Chronic Care Management (AMB) (Signed)
  Care Coordination Note  10/13/2021 Name: [name] MRN: [id number] DOB: 05-13-1961  Sherry [name] is a 61 y.o. year old female who is a primary care patient of Timothy Lasso, MD and is actively engaged with the care management team. I reached out to Sherry [name] by phone today to assist with re-scheduling an initial visit with the RN Case Manager  Follow up plan: Unsuccessful telephone outreach attempt made. A HIPAA compliant phone message was left for the patient providing contact information and requesting a return call.  The care management team will reach out to the patient again over the next 7 days.  If patient returns call to provider office, please advise to call [place] at [phone].  [facility] Management  Direct Dial: [phone]

## 2021-10-20 NOTE — Chronic Care Management (AMB) (Signed)
  Care Coordination Note  10/20/2021 Name: [name] MRN: [id number] DOB: [birth year]  Sherry [name] is a 61 y.o. year old female who is a primary care patient of Timothy Lasso, MD and is actively engaged with the care management team. I reached out to Sherry [name] by phone today to assist with re-scheduling an initial visit with the RN Case Manager  Follow up plan: Unsuccessful telephone outreach attempt made. A HIPAA compliant phone message was left for the patient providing contact information and requesting a return call.  The care management team will reach out to the patient again over the next 7 days.  If patient returns call to provider office, please advise to call [facility] at [phone].  [facility] Management  Direct Dial: [phone]

## 2021-10-21 ENCOUNTER — Encounter: Payer: Self-pay | Admitting: Dietician

## 2021-10-23 ENCOUNTER — Telehealth: Payer: Self-pay | Admitting: Licensed Clinical Social Worker

## 2021-10-23 ENCOUNTER — Telehealth: Payer: 59 | Admitting: Licensed Clinical Social Worker

## 2021-10-23 NOTE — Telephone Encounter (Signed)
  Care Management   Follow Up Note   10/23/2021 Name: Sherry [name] MRN: [id number] DOB: [birth year]   Referred by: Timothy Lasso, MD Reason for referral : No chief complaint on file.   An unsuccessful telephone outreach was attempted today. The patient was referred to the case management team for assistance with care management and care coordination.   Follow Up Plan: No further follow up required: Patient has SW contact information if additional needs arise.  Lenor Derrick, MSW  Social Worker [facility] Care Management  [phone]

## 2021-10-27 NOTE — Chronic Care Management (AMB) (Signed)
  Care Coordination Note  10/27/2021 Name: Sherry [name] MRN: [id number] DOB: 1960/07/24  Sherry [name] is a 61 y.o. year old female who is a primary care patient of Timothy Lasso, MD and is actively engaged with the care management team. I reached out to Sherry [name] by phone today to assist with scheduling an initial visit with the RN Case Manager  Follow up plan: A third unsuccessful telephone outreach attempt made. A HIPAA compliant phone message was left for the patient providing contact information and requesting a return call. Unable to make contact on outreach attempts x 3. PCP Timothy Lasso, MD notified via routed documentation in medical record. We have been unable to make contact with the patient for follow up. The care management team is available to follow up with the patient after provider conversation with the patient regarding recommendation for care management engagement and subsequent re-referral to the care management team.   [facility]  Direct Dial: [phone]

## 2021-10-31 ENCOUNTER — Other Ambulatory Visit: Payer: 59

## 2021-11-07 ENCOUNTER — Ambulatory Visit: Payer: 59 | Admitting: Podiatry

## 2021-11-07 DIAGNOSIS — M7752 Other enthesopathy of left foot: Secondary | ICD-10-CM

## 2021-11-07 MED ORDER — TRIAMCINOLONE ACETONIDE 10 MG/ML IJ SUSP
10.0000 mg | Freq: Once | INTRAMUSCULAR | Status: AC
  Administered 2021-11-07: 10 mg

## 2021-11-07 NOTE — Patient Instructions (Signed)

## 2021-11-09 NOTE — Progress Notes (Signed)
Subjective: 61 year old female presents the office today requesting another steroid injection to her left foot as the last injection was very helpful.  Still in some discomfort starting to come back but overall doing better.  No recent injury or change otherwise since she was last seen in the office.  Objective: AAO x3, NAD DP/PT pulses palpable bilaterally, CRT less than 3 seconds Tenderness palpation of the left foot along the sinus tarsi there is decreased subtalar joint range of motion.  Trace edema there is no erythema or warmth.  Mild discomfort anterior ankle joint line.  No other areas of pinpoint tenderness.  MMT 5/5. No pain with calf compression, swelling, warmth, erythema  Assessment: Left subtalar joint capsulitis  Plan: -All treatment options discussed with the patient including all alternatives, risks, complications.  -Another steroid injection was performed.  Discussed risks of repeat injection.  Mixture 1 cc Kenalog 10, 0.5 cc of Marcaine plain, 0.5 cc of lidocaine plain was infiltrated into the sinus tarsi without complications.  Postinjection care discussed. -Discussed supportive shoe gear, inserts/bracing options.  Follow-up with Aaron Edelman, orthotist.  -Discussed surgical intervention for subtalar joint arthrodesis but she is not interested at this time. -Patient encouraged to call the office with any questions, concerns, change in symptoms.   Trula Slade DPM

## 2021-11-14 ENCOUNTER — Ambulatory Visit (INDEPENDENT_AMBULATORY_CARE_PROVIDER_SITE_OTHER): Payer: 59

## 2021-11-14 DIAGNOSIS — M19072 Primary osteoarthritis, left ankle and foot: Secondary | ICD-10-CM | POA: Diagnosis not present

## 2021-11-14 DIAGNOSIS — R2681 Unsteadiness on feet: Secondary | ICD-10-CM | POA: Diagnosis not present

## 2021-11-14 DIAGNOSIS — R269 Unspecified abnormalities of gait and mobility: Secondary | ICD-10-CM

## 2021-11-14 NOTE — Progress Notes (Signed)
SITUATION Reason for Visit: Dispensation and Fitting of [place] Patient Report: Patient reports comfort in ambulation and understands all instructions.  OBJECTIVE DATA Patient History / Diagnosis:     ICD-10-CM   1. Abnormality of gait  R26.9     2. Arthritis of foot, left  M19.072     3. Gait instability  R26.81       Provided Device:  Custom Molded Gauntlet: Style Chief Operating Officer  Goals of Orthosis: - Improve gait - Decrease energy expenditure during the gait cycle - Improve balance - Stabilize motion at ankle and subtalar joint - Compensate for muscle weakness - Facilitate motion - Provide triplanar ankle and foot stabilization for weight bearing activities  Device Justification: - Patient is ambulatory  - Device is medically necessary as part of the overall treatment due to the patient's condition and related symptoms - It is anticipated that the patient will benefit functionally with use of the device.  - The custom device is utilized in an attempt to avoid the need for surgery and because a prefabricated device is inappropriate.  Upon gait analysis, the device appeared to be fitting well and the patient states that the device is comfortable.  ACTIONS PERFORMED Patient was fit with Dance movement psychotherapist. Patient tolerated fitting procedure. Fit of the device is good. Patient was able to apply properly and ambulate without distress. Device function is to restrict and limit motion and provide stabilization in the ankle joint.   Goals and function of this device were explained in detail to the patient. The patient was shown how to properly apply, wear, and care for the device. It was explained that the device will fit and function best in an adjustable-closure shoe with a firm heel counter and a wide base of support. When the device was dispensed, it was suitable for the patient's condition and not substandard. No guarantees were given.  Precautions were reviewed.   Written instructions, warranty information, and a copy of DMEPOS Supplier Standards were provided. All questions answered and concerns addressed.  PLAN Patient is to follow up in one week or as necessary (PRN). Plan of care was discussed with and agreed upon by patient.

## 2021-11-17 ENCOUNTER — Other Ambulatory Visit: Payer: Self-pay | Admitting: Internal Medicine

## 2021-11-17 ENCOUNTER — Other Ambulatory Visit: Payer: Self-pay | Admitting: Podiatry

## 2021-11-17 ENCOUNTER — Other Ambulatory Visit (HOSPITAL_COMMUNITY): Payer: Self-pay

## 2021-11-17 DIAGNOSIS — E11649 Type 2 diabetes mellitus with hypoglycemia without coma: Secondary | ICD-10-CM

## 2021-11-17 DIAGNOSIS — I10 Essential (primary) hypertension: Secondary | ICD-10-CM

## 2021-11-17 DIAGNOSIS — M797 Fibromyalgia: Secondary | ICD-10-CM

## 2021-11-17 MED ORDER — MELOXICAM 7.5 MG PO TABS
7.5000 mg | ORAL_TABLET | Freq: Every day | ORAL | 0 refills | Status: DC | PRN
  Filled 2021-11-17: qty 30, 30d supply, fill #0

## 2021-11-17 NOTE — Telephone Encounter (Signed)
Pt requesting Refill  lisinopril-hydrochlorothiazide (ZESTORETIC) 20-25 MG tablet

## 2021-11-18 ENCOUNTER — Other Ambulatory Visit (HOSPITAL_COMMUNITY): Payer: Self-pay

## 2021-11-18 MED ORDER — LISINOPRIL-HYDROCHLOROTHIAZIDE 20-25 MG PO TABS
1.0000 | ORAL_TABLET | Freq: Every day | ORAL | 0 refills | Status: DC
  Filled 2021-11-18: qty 90, 90d supply, fill #0

## 2021-11-18 MED ORDER — CYCLOBENZAPRINE HCL 10 MG PO TABS
10.0000 mg | ORAL_TABLET | Freq: Two times a day (BID) | ORAL | 0 refills | Status: DC | PRN
  Filled 2021-11-18: qty 90, 45d supply, fill #0

## 2021-11-18 MED ORDER — PROMETHAZINE HCL 12.5 MG PO TABS
12.5000 mg | ORAL_TABLET | Freq: Three times a day (TID) | ORAL | 1 refills | Status: DC | PRN
  Filled 2021-11-18: qty 20, 7d supply, fill #0
  Filled 2021-12-23: qty 20, 7d supply, fill #1

## 2021-12-04 ENCOUNTER — Encounter: Payer: Self-pay | Admitting: Student

## 2021-12-04 ENCOUNTER — Ambulatory Visit (INDEPENDENT_AMBULATORY_CARE_PROVIDER_SITE_OTHER): Payer: 59 | Admitting: Student

## 2021-12-04 ENCOUNTER — Other Ambulatory Visit (HOSPITAL_COMMUNITY): Payer: Self-pay

## 2021-12-04 VITALS — BP 125/74 | HR 102 | Temp 98.4°F | Wt 163.8 lb

## 2021-12-04 DIAGNOSIS — K219 Gastro-esophageal reflux disease without esophagitis: Secondary | ICD-10-CM | POA: Diagnosis not present

## 2021-12-04 DIAGNOSIS — R109 Unspecified abdominal pain: Secondary | ICD-10-CM | POA: Insufficient documentation

## 2021-12-04 DIAGNOSIS — E782 Mixed hyperlipidemia: Secondary | ICD-10-CM

## 2021-12-04 DIAGNOSIS — R1084 Generalized abdominal pain: Secondary | ICD-10-CM

## 2021-12-04 MED ORDER — SIMVASTATIN 40 MG PO TABS
40.0000 mg | ORAL_TABLET | Freq: Every day | ORAL | 1 refills | Status: DC
  Filled 2021-12-04: qty 90, 90d supply, fill #0

## 2021-12-04 MED ORDER — PANTOPRAZOLE SODIUM 40 MG PO TBEC
40.0000 mg | DELAYED_RELEASE_TABLET | Freq: Two times a day (BID) | ORAL | 2 refills | Status: DC
  Filled 2021-12-04: qty 120, 60d supply, fill #0
  Filled 2021-12-11: qty 90, 90d supply, fill #0
  Filled 2022-02-26: qty 90, 90d supply, fill #1
  Filled 2022-03-05: qty 90, 90d supply, fill #2

## 2021-12-04 NOTE — Patient Instructions (Signed)
Thank you, [name] for allowing [place] to provide your care today. Today we discussed your recent abdominal pain.  I am currently unsure what is causing your abdominal pain but we will start with getting some lab work.  If your pain worsens or does not improve over the next week, schedule follow-up visit so we can do imaging to further evaluate.  I have ordered the following labs for you:  Lab Orders         CMP14 + Anion Gap         Lipase      I will call if any are abnormal. All of your labs can be accessed through "My Chart".  My Chart Access: [email or web address]  Please follow-up in 1 week  Please make sure to arrive 15 minutes prior to your next appointment. If you arrive late, you may be asked to reschedule.    We look forward to seeing you next time. Please call our clinic at [phone] if you have any questions or concerns. The best time to call is Monday-Friday from 9am-4pm, but there is someone available 24/7. If after hours or the weekend, call the main hospital number and ask for the Internal Medicine Resident On-Call. If you need medication refills, please notify your pharmacy one week in advance and they will send [place] a request.   Thank you for letting us take part in your care. Wishing you the best!  Lacinda Axon, MD 12/04/2021, 3:48 PM IM Resident, PGY-2 [facility] 41:10

## 2021-12-04 NOTE — Progress Notes (Signed)
   CC: Abdominal pain  HPI:  Sherry [name] is a 61 y.o. female with PMH as below who presents to clinic for evaluation of acute abdominal pain. Please see problem based charting for evaluation, assessment and plan.  Past Medical History:  Diagnosis Date   Acid reflux disease    Anginal pain ([place])    last time 04/18/13 in afternoon; referred to 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  Negative for vomiting, constipation or diarrhea MSK: Negative for flank or back pain GU: Negative for dysuria, pelvic pain or hematuria Neuro: Negative for headache, dizziness or weakness  Physical Exam: General: Pleasant, well-appearing elderly woman not in any acute distress. Cardiac: Tachycardic. Normal rhythm. No murmurs, rubs or gallops. No LE edema Respiratory: Lungs CTAB. No wheezing or crackles. Abdominal: Abdominal obesity. Mild tenderness palpation of the epigastrium and lower abdomen. No guarding or rebound tenderness. Normal BS. Skin: Warm, dry and intact without rashes or lesions Extremities: Atraumatic. Full ROM. Palpable radial and DP pulses. Neuro: A&O x 3. Moves all extremities. Normal sensation to gross touch. Psych: Appropriate mood and affect.  Vitals:   12/04/21 1507  BP: 125/74  Pulse: (!) 102  Temp: 98.4 F (36.9 C)  TempSrc: Oral  SpO2:  100%  Weight: 163 lb 12.8 oz (74.3 kg)    Assessment & Plan:   See Encounters Tab for problem based charting.  Patient discussed with Dr. Lockie Pares, MD, MPH

## 2021-12-04 NOTE — Assessment & Plan Note (Addendum)
Patient with a history of GERD and fibromyalgia presents to clinic today for evaluation of abdominal pain. Patient reports that 2 days ago while she was laying down, she started having some generalized abdominal pain described as sharp and occasionally cramping. The pain is constant around 5/10 with symptoms sometimes worsening for a few minutes to about 9/10 on the pain scale.  She has not noticed any particular thing that worsens or improves the pain. States the pain feels slightly different from her acid reflux. States she moves her bowels every other day but this is normal for her. Last bowel movement was this morning. She reports chronic nausea but denies any vomiting, fevers, chills, diarrhea, back pain, chest pain or trauma. On exam patient is nontoxic with mild tenderness to palpation in all quadrants of the abdomen but slightly worse in the epigastrium and around the umbilicus. There is no rebound tenderness or guarding. Patient reports her gallbladder was removed in 2008. Colonoscopy in 2021 did not show any abnormalities. At this point, the differential for patient's abdominal pain remains broad and includes gastritis, pancreatitis, abdominal wall strain, constipation, etc. Plan to evaluate with initial labs then consider imaging if symptoms worsen or do not improve.  Plan: -Check CMP and lipase -Refilled Protonix 40 mg twice daily -Follow-up in 1 week for reevaluation -Precautions given to call clinic or go to the ER  Addendum: CMP shows normal LFTs and kidney function. Lipase within normal limits. Patient requested a letter to return back to work on Monday.

## 2021-12-05 ENCOUNTER — Other Ambulatory Visit (HOSPITAL_COMMUNITY): Payer: Self-pay

## 2021-12-05 ENCOUNTER — Telehealth: Payer: Self-pay

## 2021-12-05 LAB — CMP14 + ANION GAP
ALT: 20 IU/L (ref 0–32)
AST: 14 IU/L (ref 0–40)
Albumin/Globulin Ratio: 1.5 (ref 1.2–2.2)
Albumin: 4.1 g/dL (ref 3.8–4.9)
Alkaline Phosphatase: 101 IU/L (ref 44–121)
Anion Gap: 12 mmol/L (ref 10.0–18.0)
BUN/Creatinine Ratio: 24 — ABNORMAL HIGH (ref 9–23)
BUN: 17 mg/dL (ref 6–24)
Bilirubin Total: 0.2 mg/dL (ref 0.0–1.2)
CO2: 25 mmol/L (ref 20–29)
Calcium: 10 mg/dL (ref 8.7–10.2)
Chloride: 100 mmol/L (ref 96–106)
Creatinine, Ser: 0.72 mg/dL (ref 0.57–1.00)
Globulin, Total: 2.8 g/dL (ref 1.5–4.5)
Glucose: 137 mg/dL — ABNORMAL HIGH (ref 70–99)
Potassium: 4.4 mmol/L (ref 3.5–5.2)
Sodium: 137 mmol/L (ref 134–144)
Total Protein: 6.9 g/dL (ref 6.0–8.5)
eGFR: 97 mL/min/{1.73_m2} (ref >59)

## 2021-12-05 LAB — LIPASE: Lipase: 17 U/L (ref 14–72)

## 2021-12-05 NOTE — Telephone Encounter (Signed)
Glenda - Dr. [name] is going to put a letter in the chart now and you can print off for her

## 2021-12-05 NOTE — Telephone Encounter (Signed)
Letter is completed. Sorry for the inconvenience.

## 2021-12-05 NOTE — Telephone Encounter (Signed)
Requesting a note for work, please call pt back.

## 2021-12-05 NOTE — Telephone Encounter (Signed)
Text message sent to Dr Coy Saunas.

## 2021-12-05 NOTE — Telephone Encounter (Signed)
I don't see one from Dr. Coy Saunas and don't see that she is allowed to return to work Monday based on review of his note.  You can try to paige Dr. Loni Muse or attending who saw him for more information.  Apologies.

## 2021-12-05 NOTE — Telephone Encounter (Signed)
Pt's calling back to pick up a letter to return to work.  No letter in chart. States she needs the letter stating was out of work yesterday and today and may return to work on Monday. Inform pt the office is close on Friday afternoon; in which she's aware and why she called this am. States she forgot to get the letter yesterday at the [place].

## 2021-12-05 NOTE — Telephone Encounter (Signed)
Patient requesting note will forward to Dr. Coy Saunas who saw patient on yesterday.

## 2021-12-05 NOTE — Telephone Encounter (Signed)
Letter picked up by pt's daughter.

## 2021-12-11 ENCOUNTER — Other Ambulatory Visit (HOSPITAL_COMMUNITY): Payer: Self-pay

## 2021-12-16 ENCOUNTER — Other Ambulatory Visit (HOSPITAL_COMMUNITY): Payer: Self-pay

## 2021-12-17 NOTE — Progress Notes (Signed)
Internal Medicine Clinic Attending  Case discussed with Dr. Amponsah  At the time of the visit.  We reviewed the resident's history and exam and pertinent patient test results.  I agree with the assessment, diagnosis, and plan of care documented in the resident's note.  

## 2021-12-23 ENCOUNTER — Other Ambulatory Visit (HOSPITAL_COMMUNITY): Payer: Self-pay

## 2021-12-31 ENCOUNTER — Ambulatory Visit: Payer: 59

## 2022-01-26 ENCOUNTER — Other Ambulatory Visit: Payer: Self-pay | Admitting: Internal Medicine

## 2022-01-26 ENCOUNTER — Other Ambulatory Visit (HOSPITAL_COMMUNITY): Payer: Self-pay

## 2022-01-26 DIAGNOSIS — M7521 Bicipital tendinitis, right shoulder: Secondary | ICD-10-CM

## 2022-01-26 DIAGNOSIS — E11649 Type 2 diabetes mellitus with hypoglycemia without coma: Secondary | ICD-10-CM

## 2022-01-26 DIAGNOSIS — M797 Fibromyalgia: Secondary | ICD-10-CM

## 2022-01-27 ENCOUNTER — Other Ambulatory Visit (HOSPITAL_COMMUNITY): Payer: Self-pay

## 2022-01-27 MED ORDER — DICLOFENAC SODIUM 1 % EX GEL
2.0000 g | Freq: Four times a day (QID) | CUTANEOUS | 0 refills | Status: DC
  Filled 2022-01-27 – 2022-02-26 (×2): qty 100, 12d supply, fill #0

## 2022-01-27 MED ORDER — CYCLOBENZAPRINE HCL 10 MG PO TABS
10.0000 mg | ORAL_TABLET | Freq: Two times a day (BID) | ORAL | 0 refills | Status: DC | PRN
  Filled 2022-01-27: qty 60, 30d supply, fill #0
  Filled 2022-02-26: qty 30, 15d supply, fill #1

## 2022-01-27 MED ORDER — MELOXICAM 7.5 MG PO TABS
15.0000 mg | ORAL_TABLET | Freq: Every day | ORAL | 0 refills | Status: DC
  Filled 2022-01-27: qty 60, 30d supply, fill #0

## 2022-01-27 MED ORDER — PROMETHAZINE HCL 12.5 MG PO TABS
12.5000 mg | ORAL_TABLET | Freq: Three times a day (TID) | ORAL | 1 refills | Status: DC | PRN
  Filled 2022-01-27: qty 20, 7d supply, fill #0
  Filled 2022-02-26: qty 20, 7d supply, fill #1

## 2022-01-27 NOTE — Telephone Encounter (Signed)
Approved refills. Please have patient schedule follow up, she needs a new PCP assignment. Thanks.

## 2022-01-28 ENCOUNTER — Ambulatory Visit: Payer: 59

## 2022-02-26 ENCOUNTER — Other Ambulatory Visit (HOSPITAL_COMMUNITY): Payer: Self-pay

## 2022-03-03 ENCOUNTER — Ambulatory Visit (INDEPENDENT_AMBULATORY_CARE_PROVIDER_SITE_OTHER): Payer: Self-pay | Admitting: Internal Medicine

## 2022-03-03 VITALS — BP 143/92 | HR 91 | Temp 98.6°F | Wt 166.4 lb

## 2022-03-03 DIAGNOSIS — Z794 Long term (current) use of insulin: Secondary | ICD-10-CM

## 2022-03-03 DIAGNOSIS — M797 Fibromyalgia: Secondary | ICD-10-CM

## 2022-03-03 DIAGNOSIS — E119 Type 2 diabetes mellitus without complications: Secondary | ICD-10-CM

## 2022-03-03 DIAGNOSIS — E11649 Type 2 diabetes mellitus with hypoglycemia without coma: Secondary | ICD-10-CM

## 2022-03-03 DIAGNOSIS — E782 Mixed hyperlipidemia: Secondary | ICD-10-CM

## 2022-03-03 DIAGNOSIS — I10 Essential (primary) hypertension: Secondary | ICD-10-CM

## 2022-03-03 DIAGNOSIS — Z0000 Encounter for general adult medical examination without abnormal findings: Secondary | ICD-10-CM

## 2022-03-03 DIAGNOSIS — E1165 Type 2 diabetes mellitus with hyperglycemia: Secondary | ICD-10-CM

## 2022-03-03 LAB — POCT GLYCOSYLATED HEMOGLOBIN (HGB A1C): Hemoglobin A1C: 7.1 % — AB (ref 4.0–5.6)

## 2022-03-03 LAB — GLUCOSE, CAPILLARY: Glucose-Capillary: 124 mg/dL — ABNORMAL HIGH (ref 70–99)

## 2022-03-03 MED ORDER — SEMAGLUTIDE(0.25 OR 0.5MG/DOS) 2 MG/3ML ~~LOC~~ SOPN
0.2500 mg | PEN_INJECTOR | SUBCUTANEOUS | 1 refills | Status: DC
  Filled 2022-03-03: qty 3, 28d supply, fill #0

## 2022-03-03 MED ORDER — SIMVASTATIN 40 MG PO TABS
40.0000 mg | ORAL_TABLET | Freq: Every day | ORAL | 1 refills | Status: DC
  Filled 2022-03-03: qty 90, 90d supply, fill #0
  Filled 2022-06-12: qty 90, 90d supply, fill #1
  Filled 2022-07-24: qty 30, 30d supply, fill #1

## 2022-03-03 MED ORDER — LISINOPRIL-HYDROCHLOROTHIAZIDE 20-25 MG PO TABS
1.0000 | ORAL_TABLET | Freq: Every day | ORAL | 0 refills | Status: DC
  Filled 2022-03-03: qty 90, 90d supply, fill #0

## 2022-03-03 MED ORDER — ONETOUCH DELICA PLUS LANCET33G MISC
5 refills | Status: DC
  Filled 2022-03-03 – 2022-03-10 (×2): qty 100, 30d supply, fill #0
  Filled 2022-07-24: qty 100, 30d supply, fill #1

## 2022-03-03 NOTE — Patient Instructions (Addendum)
Ms. Strider,  It was a pleasure to meet you today!   I have sent in [place] for you to take one time weekly to help with both your diabetes and weight. I would like you to follow up in 1 month to see how you are doing on this medicine.  I have sent refills of your blood pressure and cholesterol medicines as well. I have sent a referral to the pain specialists for your fibromyalgia.  Please follow-up in 4 weeks so we can see how you are doing with the Ozempic and to do a pap smear.  My best, Dr. Marlou Sa

## 2022-03-03 NOTE — Progress Notes (Addendum)
CC: routine check-up, fibromyalgia pain  HPI:  [name] is a 61 y.o. person with past medical history as detailed below who presents today for routine check-up and with the complaint of fibromyalgia pain. Please see problem based charting for detailed assessment and plan.  Past Medical History:  Diagnosis Date   Acid reflux disease    Anginal pain ([place])    last time 04/18/13 in afternoon; referred to [name]   Anxiety    Cancer [facility])    uterine- had hysterectomy   Chest pain 04/07/2011   Chronic pain    Chronic pain 06/19/2014   Cough 09/25/2011   Depression    Fast heart beat    Fibromyalgia    Headache(784.0) 12/27/2012   History of blood transfusion 1981   childbirth   History of kidney stones    passed   Hypertension    Influenza A 07/08/2017   Left wrist pain 03/10/2013   Migraine    last one was 04/18/13   Sleep apnea    does not use CPAP   Trichomonas contact, treated    Review of Systems:  Negative unless otherwise stated.  Physical Exam:  Vitals:   03/03/22 1600 03/03/22 1619  BP: (!) 142/86 (!) 143/92  Pulse: 95 91  Temp: 98.6 F (37 C)   TempSrc: Oral   SpO2: 98%   Weight: 166 lb 6.4 oz (75.5 kg)    Constitutional:Appears well. In no acute distress. Cardio:Regular rate and rhythm. No murmurs, rubs, or gallops. Pulm:Clear to auscultation bilaterally. Normal work of breathing on room air. PFX:TKWIOXBD for extremity edema. No focal tenderness. Skin:Warm and dry without rashes or lesions. Neuro:Alert and oriented x3. No focal deficit noted. Psych:Pleasant mood and affect.  Assessment & Plan:   See Encounters Tab for problem based charting.  Fibromyalgia Patient complains of feeling fatigued throughout the day every day. She returns home from work and most days immediately gets into bed, though she sometimes struggles to sleep. Of note she does have a reported history of OSA and insomnia though she has not undergone recommended  repeat sleep study for her OSA and does not use a CPAP.  She feels the pain from her fibromyalgia over her entire body and states that season changes make this worse, specifically when it begins to get colder outside. She denies GI complaints, brain fog, cold or heat intolerance. She used to go to a pain clinic to help with her fibromyalgia and requests a referral to do this again. Plan:Referral to pain clinic placed.  Mixed hyperlipidemia Patient often falls asleep without taking simvastatin 40 mg daily. Plan:Patient encouraged to take this medication daily. Will plan to check lipid panel at next OV.  Type 2 diabetes mellitus (HCC) HbA1c of 7.1% today, slightly improved from 7.3% on last check. She does not take medication for this and does not check her blood sugar regularly at home. She is interested in trying to get her HbA1c better controlled in addition to losing weight. Plan:Will send in [place] and plan to follow-up in about 4 weeks to see how she is doing with this new medication. Can plan to increase this dose at that time if tolerating well. Will also check urine protein studies today.  ADDENDUM: Ozempic not covered by insurance and company is requesting Trulicity. Patient in agreement to this change. New prescription sent in.  Essential hypertension BP above goal today, 143/92. Patient states that she did not take her medication yet today. Currently  manated with lisinopril-HCTZ 20-25 mg daily. Plan:Continue lisinopril-HCTZ 20-25 mg daily.  Healthcare maintenance Patient is well overdue for a pap smear. She has had a total hysterectomy due to cervical cancer and does still need regular testing. Plan:Patient in agreement to have pap smear done at next OV.  Patient discussed with Dr. [name]

## 2022-03-04 ENCOUNTER — Telehealth: Payer: Self-pay | Admitting: *Deleted

## 2022-03-04 ENCOUNTER — Other Ambulatory Visit (HOSPITAL_COMMUNITY): Payer: Self-pay

## 2022-03-04 LAB — MICROALBUMIN / CREATININE URINE RATIO
Creatinine, Urine: 381.8 mg/dL
Microalb/Creat Ratio: 2 mg/g creat (ref 0–29)
Microalbumin, Urine: 9.1 ug/mL

## 2022-03-04 NOTE — Telephone Encounter (Signed)
Received call from [place] at [facility]. States Ozempic is non-formulary. She gave the following info:  [employer] [phone] RX FTLIN BIN #[id number] ID #[id number] Grp # [id number]

## 2022-03-04 NOTE — Telephone Encounter (Signed)
Per PA Staff, Trulicity is preferred. Paper with more information is in [employer].

## 2022-03-04 NOTE — Telephone Encounter (Signed)
Even though trulicity is the preferred drug for insurance  Pa was started on cover my meds was  submitted with last office notes and labs ... Awaiting approval or denial ..      If denied we will get more info directly from insurance on what they will and will not cover and why

## 2022-03-05 ENCOUNTER — Telehealth: Payer: Self-pay

## 2022-03-05 ENCOUNTER — Other Ambulatory Visit (HOSPITAL_COMMUNITY): Payer: Self-pay

## 2022-03-05 DIAGNOSIS — E782 Mixed hyperlipidemia: Secondary | ICD-10-CM | POA: Insufficient documentation

## 2022-03-05 DIAGNOSIS — E1165 Type 2 diabetes mellitus with hyperglycemia: Secondary | ICD-10-CM | POA: Insufficient documentation

## 2022-03-05 DIAGNOSIS — E1169 Type 2 diabetes mellitus with other specified complication: Secondary | ICD-10-CM | POA: Insufficient documentation

## 2022-03-05 NOTE — Assessment & Plan Note (Signed)
Patient is well overdue for a pap smear. She has had a total hysterectomy due to cervical cancer and does still need regular testing. Plan:Patient in agreement to have pap smear done at next OV.

## 2022-03-05 NOTE — Telephone Encounter (Signed)
DECISION :    Denied reason being the covered drug was not tried and failed  ( TRULICITY/DULAGLUTIDE )          ( COPY PLACE TO SCAN TO CHART WITH MORE DETAILS  ALSO )

## 2022-03-05 NOTE — Assessment & Plan Note (Signed)
BP above goal today, 143/92. Patient states that she did not take her medication yet today. Currently manated with lisinopril-HCTZ 20-25 mg daily. Plan:Continue lisinopril-HCTZ 20-25 mg daily.

## 2022-03-05 NOTE — Assessment & Plan Note (Signed)
Patient often falls asleep without taking simvastatin 40 mg daily. Plan:Patient encouraged to take this medication daily. Will plan to check lipid panel at next OV.

## 2022-03-05 NOTE — Assessment & Plan Note (Addendum)
HbA1c of 7.1% today, slightly improved from 7.3% on last check. She does not take medication for this and does not check her blood sugar regularly at home. She is interested in trying to get her HbA1c better controlled in addition to losing weight. Plan:Will send in [place] and plan to follow-up in about 4 weeks to see how she is doing with this new medication. Can plan to increase this dose at that time if tolerating well. Will also check urine protein studies today.  ADDENDUM: Ozempic not covered by insurance and company is requesting Trulicity. Patient in agreement to this change. New prescription sent in.

## 2022-03-05 NOTE — Assessment & Plan Note (Signed)
Patient complains of feeling fatigued throughout the day every day. She returns home from work and most days immediately gets into bed, though she sometimes struggles to sleep. Of note she does have a reported history of OSA and insomnia though she has not undergone recommended repeat sleep study for her OSA and does not use a CPAP.  She feels the pain from her fibromyalgia over her entire body and states that season changes make this worse, specifically when it begins to get colder outside. She denies GI complaints, brain fog, cold or heat intolerance. She used to go to a pain clinic to help with her fibromyalgia and requests a referral to do this again. Plan:Referral to pain clinic placed.

## 2022-03-05 NOTE — Telephone Encounter (Signed)
UPDATE :     [employer] is reviewing your PA request and will respond within 24 hours for Medicaid or up to 72 hours for non-Medicaid plans, based on the required timeframe determined by state or federal regulations. To check for an update later, open this request from your dashboard.

## 2022-03-06 ENCOUNTER — Other Ambulatory Visit (HOSPITAL_COMMUNITY): Payer: Self-pay

## 2022-03-06 ENCOUNTER — Telehealth: Payer: Self-pay | Admitting: *Deleted

## 2022-03-06 MED ORDER — TRULICITY 0.75 MG/0.5ML ~~LOC~~ SOAJ
0.7500 mg | SUBCUTANEOUS | 1 refills | Status: DC
Start: 2022-03-06 — End: 2022-05-12
  Filled 2022-03-06: qty 2, 28d supply, fill #0
  Filled 2022-04-10: qty 2, 28d supply, fill #1

## 2022-03-06 NOTE — Progress Notes (Signed)
Internal Medicine Clinic Attending ? ?Case discussed with Dr. [name]  At the time of the visit.  We reviewed the resident?s history and exam and pertinent patient test results.  I agree with the assessment, diagnosis, and plan of care documented in the resident?s note.  ?

## 2022-03-06 NOTE — Addendum Note (Signed)
Addended by: Renato Battles on: 03/06/2022 09:47 AM   Modules accepted: Orders

## 2022-03-06 NOTE — Telephone Encounter (Signed)
Call from patient stating that the Lancets that she received do not fit her meter.  Was told by Pharmacy that she would need to get a new meter to fit the Lancets.  Patient would like to keep present meter.   Pharmacy does carry Lancets for her Meter.  Will need to have order sent for [facility] Lancets.  Patient was wanting to know what she needs to do with the box she was told to get by Pharmacy.  Patient was told she will need to speak with Pharmacy about  getting a refund.

## 2022-03-10 ENCOUNTER — Other Ambulatory Visit (HOSPITAL_COMMUNITY): Payer: Self-pay

## 2022-03-13 NOTE — Telephone Encounter (Signed)
error 

## 2022-03-25 ENCOUNTER — Other Ambulatory Visit: Payer: Self-pay | Admitting: Internal Medicine

## 2022-03-25 ENCOUNTER — Other Ambulatory Visit (HOSPITAL_COMMUNITY): Payer: Self-pay

## 2022-03-25 DIAGNOSIS — M797 Fibromyalgia: Secondary | ICD-10-CM

## 2022-03-25 DIAGNOSIS — E11649 Type 2 diabetes mellitus with hypoglycemia without coma: Secondary | ICD-10-CM

## 2022-03-26 ENCOUNTER — Other Ambulatory Visit (HOSPITAL_COMMUNITY): Payer: Self-pay

## 2022-03-26 MED ORDER — PROMETHAZINE HCL 12.5 MG PO TABS
12.5000 mg | ORAL_TABLET | Freq: Three times a day (TID) | ORAL | 0 refills | Status: DC | PRN
  Filled 2022-03-26: qty 20, 7d supply, fill #0

## 2022-03-26 MED ORDER — CYCLOBENZAPRINE HCL 10 MG PO TABS
10.0000 mg | ORAL_TABLET | Freq: Two times a day (BID) | ORAL | 0 refills | Status: DC | PRN
  Filled 2022-03-26: qty 60, 30d supply, fill #0

## 2022-03-26 NOTE — Telephone Encounter (Signed)
Approved short term refill for flexeril and phenergan. Please have patient schedule follow up. Thanks.

## 2022-04-09 ENCOUNTER — Other Ambulatory Visit: Payer: Self-pay | Admitting: Internal Medicine

## 2022-04-09 DIAGNOSIS — M797 Fibromyalgia: Secondary | ICD-10-CM

## 2022-04-10 ENCOUNTER — Other Ambulatory Visit (HOSPITAL_COMMUNITY): Payer: Self-pay

## 2022-04-10 ENCOUNTER — Other Ambulatory Visit: Payer: Self-pay | Admitting: Internal Medicine

## 2022-04-10 DIAGNOSIS — M7521 Bicipital tendinitis, right shoulder: Secondary | ICD-10-CM

## 2022-04-16 ENCOUNTER — Ambulatory Visit
Admission: RE | Admit: 2022-04-16 | Discharge: 2022-04-16 | Disposition: A | Discharge status: Home / Self Care | Payer: Self-pay | Source: Ambulatory Visit | Attending: Internal Medicine | Admitting: Internal Medicine

## 2022-04-16 DIAGNOSIS — Z1231 Encounter for screening mammogram for malignant neoplasm of breast: Secondary | ICD-10-CM

## 2022-04-28 ENCOUNTER — Encounter: Payer: Self-pay | Admitting: Internal Medicine

## 2022-05-10 ENCOUNTER — Emergency Department (HOSPITAL_COMMUNITY)
Admission: EM | Admit: 2022-05-10 | Discharge: 2022-05-10 | Disposition: A | Discharge status: Home / Self Care | Payer: Commercial Managed Care - HMO | Attending: Emergency Medicine | Admitting: Emergency Medicine

## 2022-05-10 ENCOUNTER — Emergency Department (HOSPITAL_COMMUNITY): Payer: Commercial Managed Care - HMO

## 2022-05-10 ENCOUNTER — Other Ambulatory Visit: Payer: Self-pay

## 2022-05-10 ENCOUNTER — Encounter (HOSPITAL_COMMUNITY): Payer: Self-pay

## 2022-05-10 DIAGNOSIS — I10 Essential (primary) hypertension: Secondary | ICD-10-CM | POA: Diagnosis not present

## 2022-05-10 DIAGNOSIS — Z79899 Other long term (current) drug therapy: Secondary | ICD-10-CM | POA: Diagnosis not present

## 2022-05-10 DIAGNOSIS — M25561 Pain in right knee: Secondary | ICD-10-CM | POA: Insufficient documentation

## 2022-05-10 MED ORDER — HYDROCODONE-ACETAMINOPHEN 5-325 MG PO TABS
1.0000 | ORAL_TABLET | Freq: Once | ORAL | Status: AC
  Administered 2022-05-10: 1 via ORAL
  Filled 2022-05-10: qty 1

## 2022-05-10 MED ORDER — HYDROCODONE-ACETAMINOPHEN 5-325 MG PO TABS: 2.0000 | ORAL_TABLET | ORAL | 0 refills | Status: AC | PRN

## 2022-05-10 NOTE — Discharge Instructions (Addendum)
You have been seen in the ED for right knee pain. Your x ray returned concerning for early arthritis changes and fluid in your joint. I have provided you with referral to orthopedics. Please call Dr. Bettina Gavia office tomorrow morning for follow up visit. You can also see your PCP regarding this when you see them on Tuesday.  Please continue using voltaran gel twice a day. I have prescribed you a prescription for Norco which contain both tylenol and hydrocodone which is a narcotic medication. Please use this carefully as it can cause drowsiness if you are not used to it. Do not use prior to driving or operating heavy machinery. Please only use as prescribed as it can cause respiratory depression if taken in high doses.   We have provided you with crutches. Knee brace may be helpful as well.

## 2022-05-10 NOTE — ED Triage Notes (Signed)
Patient c/o right knee pain and swelling x 3 weeks and worsening in the past few days. Patient denies any injury.

## 2022-05-10 NOTE — ED Provider Notes (Signed)
[facility] DEPT Provider Note   CSN: [id number] Arrival date & time: 05/10/22  3500     History Past medical history includes hypertension, history of uterine cancer status post hysterectomy, fibromyalgia Chief Complaint  Patient presents with   Knee Pain    Sherry [name] is a 61 y.o. female.  Patient presenting with right knee pain that is been gradually worsening for 3 weeks now.  She does not remember a specific injury.  She says it is worse when she walks.  She still has symptoms at rest though where she feels like her knee locks up.  She has tried multiple methods for her pain.  She has tried Tylenol, ibuprofen, naproxen, Flexeril, and Voltaren gel without any symptom relief.  It is to the point where it is keeping her up all night.  Denies any fevers, chills, numbness or tingling in the extremity, inability to bear weight, skin changes, or joint swelling.  Knee Pain      Home Medications Prior to Admission medications   Medication Sig Start Date End Date Taking? Authorizing Provider  aspirin EC 81 MG tablet Take 81 mg by mouth daily. Swallow whole.    [provider]  aspirin-acetaminophen-caffeine (EXCEDRIN MIGRAINE) [phone] MG tablet Take 2 tablets by mouth every 6 (six) hours as needed for headache.    [provider]  Cyanocobalamin (VITAMIN B 12 PO) Take by mouth.    [provider]  cyclobenzaprine (FLEXERIL) 10 MG tablet Take 1 tablet (10 mg total) by mouth 2 (two) times daily as needed. 03/26/22   Velna Ochs, MD  diclofenac Sodium (VOLTAREN) 1 % GEL Apply 2 grams topically 4 (four) times daily. 01/27/22   Velna Ochs, MD  Dulaglutide (TRULICITY) 3.71 IR/6.7EL SOPN Inject 0.75 mg into the skin once a week. 03/06/22   Farrel Gordon, DO  Ferrous Sulfate (IRON PO) Take by mouth.     [provider]  glucose blood (CONTOUR NEXT TEST) test strip Check blood sugar before and 2 hours after a  meal to see how food affects blood sugars once a day 12/30/20   Timothy Lasso, MD  lisinopril-hydrochlorothiazide (ZESTORETIC) 20-25 MG tablet Take 1 tablet by mouth daily. 03/03/22   Farrel Gordon, DO  meloxicam (MOBIC) 7.5 MG tablet Take 1 tablet (7.5 mg total) by mouth daily as needed for pain. 11/17/21   Trula Slade, DPM  meloxicam (MOBIC) 7.5 MG tablet Take 2 tablets (15 mg total) by mouth daily. 01/27/22   Velna Ochs, MD  Lancets (ONETOUCH DELICA PLUS [id number]) MISC USE 1 TO CHECK BLOOD GLUCOSE BEFORE AND 2 HOURS AFTER A MEAL TO SEE HOW FOOD AFFECTS BLOOD SUGAR ONCE DAILY. 03/03/22   Farrel Gordon, DO  ondansetron (ZOFRAN-ODT) 4 MG disintegrating tablet Take 1 tablet (4 mg total) by mouth every 8 (eight) hours as needed for nausea or vomiting. 07/22/21   Rodriguez-Southworth, Sunday Spillers, PA-C  pantoprazole (PROTONIX) 40 MG tablet Take 1 tablet (40 mg total) by mouth 2 (two) times daily. 12/04/21   Lacinda Axon, MD  promethazine (PHENERGAN) 12.5 MG tablet Take 1 tablet (12.5 mg total) by mouth every 8 (eight) hours as needed for nausea or vomiting. 03/26/22   Velna Ochs, MD  simvastatin (ZOCOR) 40 MG tablet Take 1 tablet (40 mg total) by mouth at bedtime. 03/03/22 08/30/22  Farrel Gordon, DO      Allergies    Cymbalta [duloxetine hcl], Lyrica [pregabalin], and Gabapentin    Review of Systems  Review of Systems  Musculoskeletal:  Positive for arthralgias.  All other systems reviewed and are negative.   Physical Exam Updated Vital Signs BP (!) 137/100   Pulse 93   Temp 99.5 F (37.5 C) (Oral)   Resp 16   Ht 5' (1.524 m)   Wt 76.7 kg   SpO2 100%   BMI 33.01 kg/m  Physical Exam Vitals and nursing note reviewed.  Constitutional:      General: She is not in acute distress.    Appearance: Normal appearance. She is well-developed. She is not ill-appearing, toxic-appearing or diaphoretic.  HENT:     Head: Normocephalic and atraumatic.     Nose: No nasal deformity.      Mouth/Throat:     Lips: Pink. No lesions.  Eyes:     General: Gaze aligned appropriately. No scleral icterus.       Right eye: No discharge.        Left eye: No discharge.     Conjunctiva/sclera: Conjunctivae normal.     Right eye: Right conjunctiva is not injected. No exudate or hemorrhage.    Left eye: Left conjunctiva is not injected. No exudate or hemorrhage. Pulmonary:     Effort: Pulmonary effort is normal. No respiratory distress.  Musculoskeletal:     Comments: Right knee with mild swelling compared to left, no overlying erythema, not warm to the touch, range of motion intact but with some difficulty, crepitus heard with range of motion.  Right-sided pedal pulse 2+ with normal sensation.  Skin:    General: Skin is warm and dry.  Neurological:     Mental Status: She is alert and oriented to person, place, and time.  Psychiatric:        Mood and Affect: Mood normal.        Speech: Speech normal.        Behavior: Behavior normal. Behavior is cooperative.     ED Results / Procedures / Treatments   Labs (all labs ordered are listed, but only abnormal results are displayed) Labs Reviewed - No data to display  EKG None  Radiology DG Knee Complete 4 Views Right  Result Date: 05/10/2022 CLINICAL DATA:  Right knee pain and swelling EXAM: RIGHT KNEE - COMPLETE 4+ VIEW COMPARISON:  11/30/2014 FINDINGS: No evidence of fracture or dislocation. Moderate knee joint effusion. Mild tricompartmental osteoarthritis. Enthesophyte at the quadriceps tendon insertion. Soft tissues are unremarkable. IMPRESSION: 1. Mild tricompartmental osteoarthritis. 2. Moderate knee joint effusion. Electronically Signed   By: Davina Poke D.O.   On: 05/10/2022 09:37    Procedures Procedures   Medications Ordered in ED Medications  HYDROcodone-acetaminophen (NORCO/VICODIN) 5-325 MG per tablet 1 tablet (has no administration in time range)    ED Course/ Medical Decision Making/ A&P                            Medical Decision Making Amount and/or Complexity of Data Reviewed Radiology: ordered.  Risk Prescription drug management.   Patient is here with right knee pain that has been gradually worsening for 3 weeks.  This is atraumatic.  She is afebrile here with stable vitals.  Exam is not notable for any significant effusion, warmth, or decreased range of motion.  I have low suspicion for septic joint given time course and presentation.  She has mild osteoarthritis noted on x-ray as well as moderate joint effusion.  She has tried multiple pain modalities that have not worked  unfortunately.  I recommend continue Voltaren gel and other supportive measures. She is not a great candidate for systemic Prednisone with diabetes history. Will prescribe low-dose of Norco for pain until she is seen by her PCP and then orthopedic surgery following this.  I have put referral information in discharge paperwork.   Final Clinical Impression(s) / ED Diagnoses Final diagnoses:  Acute pain of right knee    Rx / DC Orders ED Discharge Orders     None         Adolphus Birchwood, PA-C 05/10/22 1006    Lacretia Leigh, MD 05/11/22 1122

## 2022-05-12 ENCOUNTER — Other Ambulatory Visit (HOSPITAL_COMMUNITY): Payer: Self-pay

## 2022-05-12 ENCOUNTER — Ambulatory Visit (INDEPENDENT_AMBULATORY_CARE_PROVIDER_SITE_OTHER): Payer: Commercial Managed Care - HMO | Admitting: Internal Medicine

## 2022-05-12 ENCOUNTER — Other Ambulatory Visit: Payer: Self-pay

## 2022-05-12 ENCOUNTER — Encounter: Payer: Self-pay | Admitting: Internal Medicine

## 2022-05-12 VITALS — BP 133/83 | HR 92 | Temp 98.3°F | Ht 60.0 in | Wt 161.7 lb

## 2022-05-12 DIAGNOSIS — M150 Primary generalized (osteo)arthritis: Secondary | ICD-10-CM

## 2022-05-12 DIAGNOSIS — E7800 Pure hypercholesterolemia, unspecified: Secondary | ICD-10-CM

## 2022-05-12 DIAGNOSIS — K219 Gastro-esophageal reflux disease without esophagitis: Secondary | ICD-10-CM | POA: Diagnosis not present

## 2022-05-12 DIAGNOSIS — M159 Polyosteoarthritis, unspecified: Secondary | ICD-10-CM

## 2022-05-12 DIAGNOSIS — E119 Type 2 diabetes mellitus without complications: Secondary | ICD-10-CM

## 2022-05-12 DIAGNOSIS — I10 Essential (primary) hypertension: Secondary | ICD-10-CM | POA: Diagnosis not present

## 2022-05-12 DIAGNOSIS — M797 Fibromyalgia: Secondary | ICD-10-CM | POA: Diagnosis not present

## 2022-05-12 DIAGNOSIS — E11649 Type 2 diabetes mellitus with hypoglycemia without coma: Secondary | ICD-10-CM

## 2022-05-12 DIAGNOSIS — E782 Mixed hyperlipidemia: Secondary | ICD-10-CM

## 2022-05-12 MED ORDER — PANTOPRAZOLE SODIUM 40 MG PO TBEC
40.0000 mg | DELAYED_RELEASE_TABLET | Freq: Two times a day (BID) | ORAL | 2 refills | Status: DC
  Filled 2022-05-12: qty 120, 60d supply, fill #0
  Filled 2022-07-24: qty 60, 30d supply, fill #1

## 2022-05-12 MED ORDER — DICLOFENAC SODIUM 1 % EX GEL
2.0000 g | Freq: Four times a day (QID) | CUTANEOUS | 0 refills | Status: DC
  Filled 2022-05-12: qty 100, 12d supply, fill #0

## 2022-05-12 MED ORDER — CYCLOBENZAPRINE HCL 10 MG PO TABS
10.0000 mg | ORAL_TABLET | Freq: Two times a day (BID) | ORAL | 0 refills | Status: DC | PRN
  Filled 2022-05-12: qty 60, 30d supply, fill #0

## 2022-05-12 MED ORDER — TRULICITY 0.75 MG/0.5ML ~~LOC~~ SOAJ
0.7500 mg | SUBCUTANEOUS | 1 refills | Status: DC
  Filled 2022-05-12: qty 2, 28d supply, fill #0
  Filled 2022-06-12 – 2022-07-24 (×4): qty 2, 28d supply, fill #1

## 2022-05-12 MED ORDER — PROMETHAZINE HCL 12.5 MG PO TABS
12.5000 mg | ORAL_TABLET | Freq: Three times a day (TID) | ORAL | 0 refills | Status: DC | PRN
  Filled 2022-05-12: qty 20, 7d supply, fill #0

## 2022-05-12 MED ORDER — LISINOPRIL-HYDROCHLOROTHIAZIDE 20-25 MG PO TABS
1.0000 | ORAL_TABLET | Freq: Every day | ORAL | 0 refills | Status: DC
  Filled 2022-05-12 – 2022-06-12 (×2): qty 90, 90d supply, fill #0
  Filled 2022-07-24: qty 30, 30d supply, fill #0

## 2022-05-12 NOTE — Patient Instructions (Signed)
Ms. Syme,  It was a pleasure to care for you today. I'm sorry that you are still having knee pain, but I'm glad that you are going to see an orthopedic doctor.  I am not making any changes to your medications today. I would like you to follow up in about 3 months for regular check up of your diabetes and blood pressure. Don't hesitate to call the clinic if you need to be seen sooner.  I will let you now what your labs show.  My best, Dr. Marlou Sa

## 2022-05-12 NOTE — Progress Notes (Unsigned)
   CC: routine f/u  HPI:  [name] is a 61 y.o. person with past medical history as detailed below who presents for routine check up. Please see problem based charting for detailed assessment and plan.  Past Medical History:  Diagnosis Date   Acid reflux disease    Anginal pain ([place])    last time 04/18/13 in afternoon; referred to [name]   Anxiety    Cancer [facility])    uterine- had hysterectomy   Chest pain 04/07/2011   Chronic pain    Chronic pain 06/19/2014   Cough 09/25/2011   Depression    Fast heart beat    Fibromyalgia    Headache(784.0) 12/27/2012   History of blood transfusion 1981   childbirth   History of kidney stones    passed   Hypertension    Influenza A 07/08/2017   Left wrist pain 03/10/2013   Migraine    last one was 04/18/13   Sleep apnea    does not use CPAP   Trichomonas contact, treated    Review of Systems:  Negative unless otherwise stated.  Physical Exam:  Vitals:   05/12/22 1501  BP: 133/83  Pulse: 92  Temp: 98.3 F (36.8 C)  TempSrc: Oral  SpO2: 97%  Weight: 161 lb 11.2 oz (73.3 kg)  Height: 5' (1.524 m)   Constitutional:Appers well, stated age. In no acute distress. Cardio:Regular rate and rhythm. No murmurs, rubs, or gallops. Pulm:Clear to auscultation bilaterally. Normal work of breathing on room air. YCX:KGYJEHUD for extremity edema. No joint line tenderness of bilateral knees. No knee edema or increased warmth. Skin:Warm and dry. Neuro:Alert and oriented x3. No focal deficit noted. Psych:Pleasant mood and affect.  Assessment & Plan:   See Encounters Tab for problem based charting.  Essential hypertension BP at goal today, 133/83. Current regimen is lisinopril-HCTZ 20-25 mg daily. Plan:Continue current regimen.  Type 2 diabetes mellitus ([place]) HbA1c 02/2022 was 7.1%. She is not yet due for repeat HbA1c. Current regimen is trulicity 1.49 mg weekly. Plan:Continue current regimen. Recheck HbA1c at next OV and  increase dose for optimal glycemic control.  Mixed hyperlipidemia Current therapy is simvastatin 40 mg daily which she has not been taking as prescribed, but says she is working to be more consistent with dosing. She takes the medication on average at least 4 of 7 days per week. Plan: Lipid panel today.  Osteoarthritis of multiple joints Patient has osteoarthritis of multiple joints including her right foot and bilateral knees, with her L knee being especially bothersome at this time. She presented to the ED 12/03 for severe R knee pain and imaging showed multicompartmental degenerative changes. She currently is using voltaren gel over her painful joints and has tried several conservative therapies including meloxicam more recently without any tangible relief in her pain. She is no longer taking the medication. She has an appointment with orthopedics today regarding her osteoarthritis.  Plan:Will send in refill for voltaren gel. Patient advised to discontinue meloxicam as it is no longer helping her symptoms. Will follow-up orthopedics recommendations.  Patient discussed with Dr. Jimmye Norman

## 2022-05-13 LAB — BMP8+ANION GAP
Anion Gap: 16 mmol/L (ref 10.0–18.0)
BUN/Creatinine Ratio: 10 (ref 9–23)
BUN: 8 mg/dL (ref 6–24)
CO2: 23 mmol/L (ref 20–29)
Calcium: 10.3 mg/dL — ABNORMAL HIGH (ref 8.7–10.2)
Chloride: 98 mmol/L (ref 96–106)
Creatinine, Ser: 0.77 mg/dL (ref 0.57–1.00)
Glucose: 103 mg/dL — ABNORMAL HIGH (ref 70–99)
Potassium: 4 mmol/L (ref 3.5–5.2)
Sodium: 137 mmol/L (ref 134–144)
eGFR: 89 mL/min/{1.73_m2} (ref >59)

## 2022-05-13 LAB — LIPID PANEL
Chol/HDL Ratio: 3.8 ratio (ref 0.0–4.4)
Cholesterol, Total: 163 mg/dL (ref 100–199)
HDL: 43 mg/dL (ref >39)
LDL Chol Calc (NIH): 105 mg/dL — ABNORMAL HIGH (ref 0–99)
Triglycerides: 77 mg/dL (ref 0–149)
VLDL Cholesterol Cal: 15 mg/dL (ref 5–40)

## 2022-05-13 NOTE — Assessment & Plan Note (Signed)
BP at goal today, 133/83. Current regimen is lisinopril-HCTZ 20-25 mg daily. Plan:Continue current regimen.

## 2022-05-13 NOTE — Assessment & Plan Note (Signed)
Current therapy is simvastatin 40 mg daily which she has not been taking as prescribed, but says she is working to be more consistent with dosing. She takes the medication on average at least 4 of 7 days per week. Plan: Lipid panel today.

## 2022-05-13 NOTE — Assessment & Plan Note (Signed)
HbA1c 02/2022 was 7.1%. She is not yet due for repeat HbA1c. Current regimen is trulicity 4.19 mg weekly. Plan:Continue current regimen. Recheck HbA1c at next OV and increase dose for optimal glycemic control.

## 2022-05-13 NOTE — Assessment & Plan Note (Addendum)
Patient has osteoarthritis of multiple joints including her right foot and bilateral knees, with her L knee being especially bothersome at this time. She presented to the ED 12/03 for severe R knee pain and imaging showed multicompartmental degenerative changes. She currently is using voltaren gel over her painful joints and has tried several conservative therapies including meloxicam more recently without any tangible relief in her pain. She is no longer taking the medication. She has an appointment with orthopedics today regarding her osteoarthritis.  Plan:Will send in refill for voltaren gel. Patient advised to discontinue meloxicam as it is no longer helping her symptoms. Will follow-up orthopedics recommendations.

## 2022-05-14 ENCOUNTER — Telehealth: Payer: Self-pay | Admitting: Internal Medicine

## 2022-05-14 NOTE — Telephone Encounter (Signed)
Patient contacted to discuss lab results however there was no answer. I left a voice message requesting that she call the clinic back at her earliest convenience.  Farrel Gordon, DO

## 2022-05-19 NOTE — Progress Notes (Signed)
Internal Medicine Clinic Attending ? ?Case discussed with Dr. [name]  At the time of the visit.  We reviewed the resident?s history and exam and pertinent patient test results.  I agree with the assessment, diagnosis, and plan of care documented in the resident?s note.  ?

## 2022-05-29 ENCOUNTER — Ambulatory Visit: Payer: Self-pay

## 2022-06-10 ENCOUNTER — Other Ambulatory Visit: Payer: Self-pay | Admitting: Internal Medicine

## 2022-06-10 ENCOUNTER — Other Ambulatory Visit (HOSPITAL_COMMUNITY): Payer: Self-pay

## 2022-06-10 DIAGNOSIS — E11649 Type 2 diabetes mellitus with hypoglycemia without coma: Secondary | ICD-10-CM

## 2022-06-10 DIAGNOSIS — M797 Fibromyalgia: Secondary | ICD-10-CM

## 2022-06-10 MED ORDER — MELOXICAM 15 MG PO TABS
15.0000 mg | ORAL_TABLET | Freq: Every day | ORAL | 0 refills | Status: DC
  Filled 2022-06-10: qty 30, 30d supply, fill #0

## 2022-06-12 ENCOUNTER — Other Ambulatory Visit (HOSPITAL_COMMUNITY): Payer: Self-pay

## 2022-06-12 ENCOUNTER — Telehealth: Payer: Self-pay | Admitting: Internal Medicine

## 2022-06-12 MED ORDER — DICLOFENAC SODIUM 1 % EX GEL
2.0000 g | Freq: Four times a day (QID) | CUTANEOUS | 0 refills | Status: DC
  Filled 2022-06-12 – 2022-07-24 (×2): qty 100, 12d supply, fill #0

## 2022-06-12 MED ORDER — CYCLOBENZAPRINE HCL 10 MG PO TABS
10.0000 mg | ORAL_TABLET | Freq: Two times a day (BID) | ORAL | 0 refills | Status: DC | PRN
  Filled 2022-06-12: qty 40, 20d supply, fill #0

## 2022-06-12 MED ORDER — PROMETHAZINE HCL 12.5 MG PO TABS
12.5000 mg | ORAL_TABLET | Freq: Three times a day (TID) | ORAL | 0 refills | Status: DC | PRN
  Filled 2022-06-12: qty 20, 7d supply, fill #0

## 2022-06-12 NOTE — Telephone Encounter (Signed)
Notified daughter that Rxs were sent today for flexeril, phenergan, and voltaren gel. All other meds have refills at [facility]. She will contact [place]. She was very Patent attorney.

## 2022-06-12 NOTE — Telephone Encounter (Signed)
Patient's daughter is calling on behalf of her stating that she needs a refill on all of her medicines.  She was unable to give which medications.   [place] Phone: [phone]  Fax: [phone]

## 2022-06-14 ENCOUNTER — Ambulatory Visit (HOSPITAL_COMMUNITY)
Admission: EM | Admit: 2022-06-14 | Discharge: 2022-06-14 | Disposition: A | Discharge status: Home / Self Care | Payer: Commercial Managed Care - HMO

## 2022-06-14 ENCOUNTER — Ambulatory Visit (INDEPENDENT_AMBULATORY_CARE_PROVIDER_SITE_OTHER): Payer: Commercial Managed Care - HMO

## 2022-06-14 ENCOUNTER — Ambulatory Visit
Admission: EM | Admit: 2022-06-14 | Discharge: 2022-06-14 | Disposition: A | Discharge status: Home / Self Care | Payer: Commercial Managed Care - HMO | Attending: Urgent Care | Admitting: Urgent Care

## 2022-06-14 DIAGNOSIS — M5416 Radiculopathy, lumbar region: Secondary | ICD-10-CM | POA: Diagnosis not present

## 2022-06-14 DIAGNOSIS — E119 Type 2 diabetes mellitus without complications: Secondary | ICD-10-CM | POA: Diagnosis not present

## 2022-06-14 DIAGNOSIS — K5900 Constipation, unspecified: Secondary | ICD-10-CM

## 2022-06-14 DIAGNOSIS — M545 Low back pain, unspecified: Secondary | ICD-10-CM

## 2022-06-14 MED ORDER — DOCUSATE SODIUM 100 MG PO CAPS: 100.0000 mg | ORAL_CAPSULE | Freq: Two times a day (BID) | ORAL | 0 refills | Status: DC

## 2022-06-14 MED ORDER — POLYETHYLENE GLYCOL 3350 17 GM/SCOOP PO POWD: 17.0000 g | Freq: Every day | ORAL | 0 refills | Status: DC | PRN

## 2022-06-14 MED ORDER — TIZANIDINE HCL 4 MG PO TABS: 4.0000 mg | ORAL_TABLET | Freq: Every day | ORAL | 0 refills | Status: DC

## 2022-06-14 MED ORDER — PREDNISONE 20 MG PO TABS: ORAL_TABLET | ORAL | 0 refills | Status: DC

## 2022-06-14 NOTE — ED Provider Notes (Signed)
[name] - URGENT CARE CENTER  Note:  This document was prepared using Systems analyst and may include unintentional dictation errors.  MRN: [id number] DOB: [birth year]  Subjective:   Sherry [name] is a 62 y.o. female presenting for acute on chronic lower abdominal pain.  Has not had a bowel movement in days.  Has longstanding history of constipation.  Has had colonoscopies and has not had anything major.  She is also had 1 day history of severe low back pain that radiates down both legs. No fall, trauma, numbness or tingling, saddle paresthesia, changes to bowel or urinary habits, radicular symptoms.  She does have an orthopedist.  Has type 2 diabetes treated without insulin.  No current facility-administered medications for this encounter.  Current Outpatient Medications:    aspirin EC 81 MG tablet, Take 81 mg by mouth daily. Swallow whole., Disp: , Rfl:    aspirin-acetaminophen-caffeine (EXCEDRIN MIGRAINE) 250-250-65 MG tablet, Take 2 tablets by mouth every 6 (six) hours as needed for headache., Disp: , Rfl:    Cyanocobalamin (VITAMIN B 12 PO), Take by mouth., Disp: , Rfl:    cyclobenzaprine (FLEXERIL) 10 MG tablet, Take 1 tablet (10 mg total) by mouth 2 (two) times daily as needed., Disp: 40 tablet, Rfl: 0   diclofenac Sodium (VOLTAREN) 1 % GEL, Apply 2 grams topically 4 (four) times daily., Disp: 100 g, Rfl: 0   Dulaglutide (TRULICITY) 3.84 YK/5.9DJ SOPN, Inject 0.75 mg into the skin once a week., Disp: 2 mL, Rfl: 1   Ferrous Sulfate (IRON PO), Take by mouth. , Disp: , Rfl:    Lancets (ONETOUCH DELICA PLUS [id number]) MISC, USE 1 TO CHECK BLOOD GLUCOSE BEFORE AND 2 HOURS AFTER A MEAL TO SEE HOW FOOD AFFECTS BLOOD SUGAR ONCE DAILY., Disp: 100 each, Rfl: 5   lisinopril-hydrochlorothiazide (ZESTORETIC) 20-25 MG tablet, Take 1 tablet by mouth daily., Disp: 90 tablet, Rfl: 0   meloxicam (MOBIC) 15 MG tablet, Take 1 tablet by mouth once a day, Disp: 30 tablet, Rfl:  0   pantoprazole (PROTONIX) 40 MG tablet, Take 1 tablet (40 mg total) by mouth 2 (two) times daily., Disp: 120 tablet, Rfl: 2   promethazine (PHENERGAN) 12.5 MG tablet, Take 1 tablet (12.5 mg total) by mouth every 8 (eight) hours as needed for nausea or vomiting., Disp: 20 tablet, Rfl: 0   simvastatin (ZOCOR) 40 MG tablet, Take 1 tablet (40 mg total) by mouth at bedtime., Disp: 90 tablet, Rfl: 1   Allergies  Allergen Reactions   Cymbalta [Duloxetine Hcl]     Nausea, vomiting   Lyrica [Pregabalin] Nausea And Vomiting   Gabapentin     Weight gain    Past Medical History:  Diagnosis Date   Acid reflux disease    Anginal pain ([place])    last time 04/18/13 in afternoon; referred to [name]   Anxiety    Cancer [facility])    uterine- had hysterectomy   Chest pain 04/07/2011   Chronic pain    Chronic pain 06/19/2014   Cough 09/25/2011   Depression    Fast heart beat    Fibromyalgia    Headache(784.0) 12/27/2012   History of blood transfusion 1981   childbirth   History of kidney stones    passed   Hypertension    Influenza A 07/08/2017   Left wrist pain 03/10/2013   Migraine    last one was 04/18/13   Sleep apnea    does not use CPAP  Trichomonas contact, treated      Past Surgical History:  Procedure Laterality Date   CHOLECYSTECTOMY     2008   ORIF CALCANEOUS FRACTURE Left 05/16/2013   Procedure: OPEN REDUCTION INTERNAL FIXATION (ORIF) LEFT CALCANEOUS FRACTURE;  Surgeon: Rozanna Box, MD;  Location: [facility];  Service: Orthopedics;  Laterality: Left;   TOTAL ABDOMINAL HYSTERECTOMY     1986   WISDOM TOOTH EXTRACTION      Family History  Problem Relation Age of Onset   Diabetes Mother    Hypertension Mother    Depression Mother    Diabetes Sister    Hypertension Father    Cancer Father        Prostate   Alcohol abuse Father    Cancer Brother        Prostate   Breast cancer Neg Hx    Colon cancer Neg Hx    Colon polyps Neg Hx    Esophageal cancer Neg Hx     Stomach cancer Neg Hx    Rectal cancer Neg Hx     Social History   Tobacco Use   Smoking status: Never   Smokeless tobacco: Never  Vaping Use   Vaping Use: Never used  Substance Use Topics   Alcohol use: No   Drug use: No    ROS   Objective:   Vitals: BP 130/84 (BP Location: Right Arm)   Pulse (!) 107   Temp 99.2 F (37.3 C) (Oral)   Resp 18   Ht 5' (1.524 m)   Wt 164 lb (74.4 kg)   SpO2 94%   BMI 32.03 kg/m   Physical Exam Constitutional:      General: She is not in acute distress.    Appearance: Normal appearance. She is well-developed. She is not ill-appearing, toxic-appearing or diaphoretic.  HENT:     Head: Normocephalic and atraumatic.     Nose: Nose normal.     Mouth/Throat:     Mouth: Mucous membranes are moist.     Pharynx: Oropharynx is clear.  Eyes:     General: No scleral icterus.       Right eye: No discharge.        Left eye: No discharge.     Extraocular Movements: Extraocular movements intact.     Conjunctiva/sclera: Conjunctivae normal.  Cardiovascular:     Rate and Rhythm: Normal rate.  Pulmonary:     Effort: Pulmonary effort is normal.  Abdominal:     General: Bowel sounds are normal. There is no distension.     Palpations: Abdomen is soft. There is no mass.     Tenderness: There is generalized abdominal tenderness and tenderness in the right lower quadrant, periumbilical area and left lower quadrant. There is no right CVA tenderness, left CVA tenderness, guarding or rebound.  Musculoskeletal:     Lumbar back: Spasms and tenderness (over paraspinal muscles) present. No swelling, edema, deformity, signs of trauma, lacerations or bony tenderness. Normal range of motion. Positive right straight leg raise test. Negative left straight leg raise test. No scoliosis.  Skin:    General: Skin is warm and dry.  Neurological:     General: No focal deficit present.     Mental Status: She is alert and oriented to person, place, and time.   Psychiatric:        Mood and Affect: Mood normal.        Behavior: Behavior normal.        Thought Content: Thought  content normal.        Judgment: Judgment normal.     DG Lumbar Spine Complete  Result Date: 06/14/2022 CLINICAL DATA:  Lumbar radiculopathy. Upper abdominal and low back pain radiating into both legs for 1 day. EXAM: LUMBAR SPINE - COMPLETE 4+ VIEW COMPARISON:  CT lumbar spine 05/10/2020. FINDINGS: There are 5 lumbar type vertebral bodies which remain in normal alignment. The disc spaces are preserved. No evidence of acute fracture or pars defect. Mild facet degenerative changes inferiorly. Cholecystectomy clips and a right retroperitoneal phlebolith are noted. Mildly prominent stool in the colon. IMPRESSION: No acute osseous findings or significant spondylosis. Mild facet degenerative changes. Electronically Signed   By: Richardean Sale M.D.   On: 06/14/2022 14:53     Assessment and Plan :   PDMP not reviewed this encounter.  1. Lumbar radiculopathy   2. Constipation, unspecified constipation type   3. Type 2 diabetes mellitus treated without insulin (H[facility])     Recommended a prednisone course for her lumbar radiculopathy.  Use tizanidine as muscle relaxant, follow-up with orthopedist.  Discussed general management of constipation.  I do not suspect an acute abdomen.  Use MiraLAX, starting to see daily, increase fiber intake. Counseled patient on potential for adverse effects with medications prescribed/recommended today, ER and return-to-clinic precautions discussed, patient verbalized understanding.    Jaynee Eagles, PA-C 06/14/22 1511

## 2022-06-14 NOTE — Discharge Instructions (Signed)
For moderate to severe constipation (not having a bowel movement in more than 3 days) then try to use an enema or Miralax once daily until you have a good bowel movement.  It is not a good idea to use an enema or laxatives daily. If you find you are doing this, then please follow up with a gastroenterologist. Otherwise, a medication you could use daily to help with promoting bowel movements is docusate (Colace) 171m. It is okay to use this 1-2 times daily as a stool softener.  Try to stay active physically including regular exercise 2-3 times a week.  Make sure you hydrate well every day with about 64 ounces of water daily (that is 2 liters).  Try to avoid carb heavy foods, dairy. This includes cutting out breads, pasta, pizza, pastries, potatoes, rice, starchy foods in general. Eat more fiber as listed below:  Salads - kale, spinach, cabbage, spring mix, arugula Fruits - avocadoes, berries (blueberries, raspberries, blackberries), apples, oranges, pomegranate, grapefruit, kiwi Vegetables - asparagus, cauliflower, broccoli, green beans, brussel sprouts, bell peppers, beets; stay away from or limit starchy vegetables like potatoes, carrots, peas Other general foods - kidney beans, egg whites, almonds, walnuts, sunflower seeds, pumpkin seeds, fat free yogurt, almond milk, flax seeds, quinoa, oats  Meat - It is better to eat lean meats and limit your red meat including pork to once a week.  Wild caught fish, chicken breast are good options as they tend to be leaner sources of good protein. Still be mindful of the sodium labels for the meats you buy.  DO NOT EAT ANY FOODS ON THIS LIST THAT YOU ARE ALLERGIC TO. For more specific needs, I highly recommend consulting a dietician or nutritionist but this can definitely be a good starting point.

## 2022-06-14 NOTE — ED Triage Notes (Signed)
Pt states that she has some upper abdominal pain and lower back pain that radiates down both legs. X1 day

## 2022-06-17 ENCOUNTER — Other Ambulatory Visit (HOSPITAL_COMMUNITY): Payer: Self-pay

## 2022-06-22 ENCOUNTER — Telehealth: Payer: Self-pay

## 2022-06-22 ENCOUNTER — Other Ambulatory Visit (HOSPITAL_COMMUNITY): Payer: Self-pay

## 2022-06-22 NOTE — Telephone Encounter (Signed)
A Prior Authorization was initiated for this patients TRULICITY through CoverMyMeds.   Key: [id number]

## 2022-06-22 NOTE — Progress Notes (Deleted)
Mixed hyperlipidemia Simvastatin 40 mg  Last lipid panel 05/12/2022: LDL 105, total cholesterol 163 ASCVD score - current: 11.5% Risk enhancing factors: premature ASCVD in a first-degree relative, chronic inflammatory conditions (e.g., rheumatoid arthritis, psoriasis, HIV/AIDS), and metabolic syndrome  Recent hx of MI, ACS, stroke, claudication symptoms? Anginal symtoms?  Intensify statin CAC score?  Diabetes Currently on Trulicity [id number] mg  Foot exam today A1c today   Radicular pain Recent ED on 1/7 for lower back pain, radiating down both legs Bowel/bladder symptoms? Anesthesia? Seen by ortho +R straight leg raise +spasms and paraspinal tenderness Lumbar spine XR with mild facet degenerative changes inferiorly.   PT?Spasms NSAIDs Topicals?   Constipation How many BMs per week? <3? N/v, bloating, anorexia, periods of diarrhea? Patient to Fe tablets Last colonoscopy 2021: inicidental lipoma in proximal ascending colon without other pathologies or concerns for report. Repeat colonoscopy in 2031.   Hypercalcemia Resolved as of 05/30/2012 BMP with Ca 9.5    Recent influenza A infection - 05/30/2022 ED visit at [facility]

## 2022-06-23 ENCOUNTER — Encounter: Payer: Commercial Managed Care - HMO | Admitting: Student

## 2022-06-24 ENCOUNTER — Other Ambulatory Visit (HOSPITAL_COMMUNITY): Payer: Self-pay

## 2022-06-24 NOTE — Telephone Encounter (Signed)
Prior Auth for patients medication TRULICITY denied by [employer] via CoverMyMeds.   Reason:   CoverMyMeds Key: [id number]

## 2022-06-25 ENCOUNTER — Other Ambulatory Visit (HOSPITAL_COMMUNITY): Payer: Self-pay

## 2022-07-02 ENCOUNTER — Encounter: Payer: Commercial Managed Care - HMO | Admitting: Physical Medicine and Rehabilitation

## 2022-07-22 ENCOUNTER — Telehealth: Payer: Self-pay

## 2022-07-22 NOTE — Telephone Encounter (Signed)
Prior authorization for patient (ozempic) came through on cover my meds unable tos submit per cover my meds ? There was an error with your request  Patient Inactive

## 2022-07-23 ENCOUNTER — Other Ambulatory Visit: Payer: Self-pay

## 2022-07-23 DIAGNOSIS — E11649 Type 2 diabetes mellitus with hypoglycemia without coma: Secondary | ICD-10-CM

## 2022-07-23 DIAGNOSIS — E782 Mixed hyperlipidemia: Secondary | ICD-10-CM

## 2022-07-23 DIAGNOSIS — M797 Fibromyalgia: Secondary | ICD-10-CM

## 2022-07-23 DIAGNOSIS — I10 Essential (primary) hypertension: Secondary | ICD-10-CM

## 2022-07-23 DIAGNOSIS — K219 Gastro-esophageal reflux disease without esophagitis: Secondary | ICD-10-CM

## 2022-07-23 DIAGNOSIS — E119 Type 2 diabetes mellitus without complications: Secondary | ICD-10-CM

## 2022-07-23 NOTE — Telephone Encounter (Signed)
Called patient yesterday and asked patient for her insurance information she stated she will give [place] a call back to provide the information.

## 2022-07-24 ENCOUNTER — Other Ambulatory Visit: Payer: Self-pay

## 2022-07-24 ENCOUNTER — Other Ambulatory Visit: Payer: Self-pay | Admitting: Internal Medicine

## 2022-07-24 ENCOUNTER — Other Ambulatory Visit (HOSPITAL_COMMUNITY): Payer: Self-pay

## 2022-07-24 DIAGNOSIS — M797 Fibromyalgia: Secondary | ICD-10-CM

## 2022-07-24 DIAGNOSIS — E11649 Type 2 diabetes mellitus with hypoglycemia without coma: Secondary | ICD-10-CM

## 2022-07-24 MED ORDER — MELOXICAM 15 MG PO TABS
15.0000 mg | ORAL_TABLET | Freq: Every day | ORAL | 0 refills | Status: DC
  Filled 2022-07-24: qty 30, 30d supply, fill #0

## 2022-07-24 MED ORDER — PROMETHAZINE HCL 12.5 MG PO TABS
12.5000 mg | ORAL_TABLET | Freq: Three times a day (TID) | ORAL | 0 refills | Status: DC | PRN
  Filled 2022-07-24: qty 20, 7d supply, fill #0

## 2022-07-24 MED ORDER — TIZANIDINE HCL 4 MG PO TABS
4.0000 mg | ORAL_TABLET | Freq: Every day | ORAL | 0 refills | Status: DC
  Filled 2022-07-24 (×2): qty 30, 30d supply, fill #0

## 2022-07-24 MED ORDER — LISINOPRIL-HYDROCHLOROTHIAZIDE 20-25 MG PO TABS
1.0000 | ORAL_TABLET | Freq: Every day | ORAL | 0 refills | Status: DC
  Filled 2022-07-24: qty 30, 30d supply, fill #0
  Filled 2022-08-28: qty 30, 30d supply, fill #1
  Filled 2022-09-29 – 2022-10-14 (×2): qty 30, 30d supply, fill #2

## 2022-07-24 MED ORDER — CYCLOBENZAPRINE HCL 10 MG PO TABS
10.0000 mg | ORAL_TABLET | Freq: Two times a day (BID) | ORAL | 0 refills | Status: DC | PRN
  Filled 2022-07-24: qty 40, 20d supply, fill #0

## 2022-07-24 MED ORDER — ONETOUCH DELICA PLUS LANCET33G MISC
5 refills | Status: DC
  Filled 2022-07-24: qty 100, 30d supply, fill #0

## 2022-07-24 MED ORDER — DICLOFENAC SODIUM 1 % EX GEL
2.0000 g | Freq: Four times a day (QID) | CUTANEOUS | 0 refills | Status: DC
  Filled 2022-07-24: qty 100, 12d supply, fill #0

## 2022-07-24 MED ORDER — PANTOPRAZOLE SODIUM 40 MG PO TBEC
40.0000 mg | DELAYED_RELEASE_TABLET | Freq: Two times a day (BID) | ORAL | 2 refills | Status: DC
  Filled 2022-07-24: qty 60, 30d supply, fill #0
  Filled 2022-08-28: qty 30, 15d supply, fill #1
  Filled 2022-09-29: qty 30, 15d supply, fill #2
  Filled 2022-10-14: qty 60, 30d supply, fill #2
  Filled 2022-11-16: qty 60, 30d supply, fill #3
  Filled 2022-12-11: qty 60, 30d supply, fill #4
  Filled 2023-01-18: qty 60, 30d supply, fill #5
  Filled 2023-02-15: qty 30, 15d supply, fill #6

## 2022-07-24 MED ORDER — POLYETHYLENE GLYCOL 3350 17 GM/SCOOP PO POWD
17.0000 g | Freq: Every day | ORAL | 0 refills | Status: AC | PRN
  Filled 2022-07-24: qty 510, 30d supply, fill #0

## 2022-07-24 MED ORDER — SIMVASTATIN 40 MG PO TABS
40.0000 mg | ORAL_TABLET | Freq: Every day | ORAL | 1 refills | Status: DC
  Filled 2022-07-24: qty 30, 30d supply, fill #0
  Filled 2022-08-28: qty 30, 30d supply, fill #1
  Filled 2022-10-14: qty 30, 30d supply, fill #2
  Filled 2022-11-16: qty 30, 30d supply, fill #3
  Filled 2023-01-18: qty 30, 30d supply, fill #4
  Filled 2023-02-15: qty 30, 30d supply, fill #5

## 2022-07-24 MED ORDER — TRULICITY 0.75 MG/0.5ML ~~LOC~~ SOAJ
0.7500 mg | SUBCUTANEOUS | 1 refills | Status: DC
  Filled 2022-07-24 – 2022-07-30 (×2): qty 2, 28d supply, fill #0

## 2022-07-24 MED ORDER — DOCUSATE SODIUM 100 MG PO CAPS
100.0000 mg | ORAL_CAPSULE | Freq: Two times a day (BID) | ORAL | 0 refills | Status: DC
  Filled 2022-07-24 – 2023-07-14 (×2): qty 60, 30d supply, fill #0

## 2022-07-28 ENCOUNTER — Encounter: Payer: Self-pay | Admitting: Physical Medicine and Rehabilitation

## 2022-07-28 ENCOUNTER — Encounter: Payer: 59 | Attending: Physical Medicine and Rehabilitation | Admitting: Physical Medicine and Rehabilitation

## 2022-07-28 ENCOUNTER — Other Ambulatory Visit (HOSPITAL_COMMUNITY): Payer: Self-pay

## 2022-07-28 VITALS — HR 97 | Ht 60.0 in | Wt 165.0 lb

## 2022-07-28 DIAGNOSIS — M797 Fibromyalgia: Secondary | ICD-10-CM

## 2022-07-28 DIAGNOSIS — R252 Cramp and spasm: Secondary | ICD-10-CM | POA: Diagnosis not present

## 2022-07-28 DIAGNOSIS — M7918 Myalgia, other site: Secondary | ICD-10-CM | POA: Insufficient documentation

## 2022-07-28 MED ORDER — OXYCODONE-ACETAMINOPHEN 5-325 MG PO TABS: 1.0000 | ORAL_TABLET | Freq: Two times a day (BID) | ORAL | 0 refills | Status: DC | PRN

## 2022-07-28 NOTE — Progress Notes (Signed)
Subjective:    Patient ID: Sherry [name], female    DOB: 11-Aug-1960, 62 y.o.   MRN: [id number]  HPI  Sherry [name] is a 62 year old female who presents for follow-up of her fibromyalgia.   1) Fibromyalgia Her pain has been getting worse.  -she knows it will never go away -she works about 40 hours per week -her legs hurt very badly and her gait is impaired when the pain is severe -she has no husband  2) Muscle cramps: -she has been taking mustard, she can't take too much because it upsets her stomach -wakes up with this -she can't take too many bananas due to their potassium  She takes Tylenol but it doesn't help.   She took Hydrocodone and Percocet before. She was taking these as needed- waiting until the pain is very severe.   Prior history: She is currently has a headache. She gets these often. She is not interested in Botox. She takes Excedrin as needed.  She also has a lot of muscle tightness.  Time helps the pain.  She has had fibromyalgia for may years. She used to follow with Dr. Naaman Plummer. She uses a heating pad which does provide relief. Pain medications take the edge off the pain. She is unable to tolerate family retreats in [place] because of the cold. She has been in worse pain since last visit. Ice does not help.   She takes Ibuprofen as she cannot tolerate the Tylenol. Sometimes this helps and sometimes it doesn't. She takes about 5-6 of the Ibuprofen. She takes it more when she gets home when her pain is worse.   She wants to exercise to lose some weights. Her grandkids are 21 to 4- she has 33 grandchildren and 4 great grandkids. Weight is currently 175 lbs, BMI 32.26.   She wants to date. She wants to be young again.   She did not tolerate Lyrica or Cymbalta well. Gabapentin made her gain weight. She cannot recall her response to Amitriptyline.   Average pain is 10/10, pain right now is 7/10 and feels aching on both sides of her body in multiple joints and  muscles.   Pain Inventory Average Pain 10 Pain Right Now 8 My pain is aching  In the last 24 hours, has pain interfered with the following? General activity 10 Relation with others 1 Enjoyment of life 1 What TIME of day is your pain at its worst? Morning,daytime, evening, night Sleep (in general) Fair  Pain is worse with: walking, bending, sitting, inactivity, and standing Pain improves with: rest, heat/ice, and medication Relief from Meds:  fair   Family History  Problem Relation Age of Onset   Diabetes Mother    Hypertension Mother    Depression Mother    Diabetes Sister    Hypertension Father    Cancer Father        Prostate   Alcohol abuse Father    Cancer Brother        Prostate   Breast cancer Neg Hx    Colon cancer Neg Hx    Colon polyps Neg Hx    Esophageal cancer Neg Hx    Stomach cancer Neg Hx    Rectal cancer Neg Hx    Social History   Socioeconomic History   Marital status: Single    Spouse name: Not on file   Number of children: 2   Years of education: Not on file   Highest education level: 12th grade  Occupational History   Occupation: Unemployed  Tobacco Use   Smoking status: Never   Smokeless tobacco: Never  Vaping Use   Vaping Use: Never used  Substance and Sexual Activity   Alcohol use: No   Drug use: No   Sexual activity: Yes    Birth control/protection: None    Comment: last intercouse two months ago  Other Topics Concern   Not on file  Social History Narrative   Lives with   2 Children: Ages 45 and 20   4 Grandchildren: 2, 3, 4 and 69   Brother      Diet: "Barely eats". Does not exercise due to pain.      03/2011- Currently unemployed. 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 Unsure of last pap.   Unsure of last Td   Unsure of last flu shot   Social Determinants of Health   Financial Resource Strain: Not on file  Food Insecurity: No Food Insecurity (09/11/2021)    Hunger Vital Sign    Worried About Running Out of Food in the Last Year: Never true    Ran Out of Food in the Last Year: Never true  Transportation Needs: No Transportation Needs (09/11/2021)   PRAPARE - Hydrologist (Medical): No    Lack of Transportation (Non-Medical): No  Physical Activity: Not on file  Stress: Not on file  Social Connections: Not on file   Past Surgical History:  Procedure Laterality Date   CHOLECYSTECTOMY     2008   ORIF CALCANEOUS FRACTURE Left 05/16/2013   Procedure: OPEN REDUCTION INTERNAL FIXATION (ORIF) LEFT CALCANEOUS FRACTURE;  Surgeon: Rozanna Box, MD;  Location: [place];  Service: Orthopedics;  Laterality: Left;   TOTAL ABDOMINAL HYSTERECTOMY     1986   WISDOM TOOTH EXTRACTION     Past Medical History:  Diagnosis Date   Acid reflux disease    Anginal pain ([place])    last time 04/18/13 in afternoon; referred to 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   Chest pain 04/07/2011   Chronic pain    Chronic pain 06/19/2014   Cough 09/25/2011   Depression    Fast heart beat    Fibromyalgia    Headache(784.0) 12/27/2012   History of blood transfusion 1981   childbirth   History of kidney stones    passed   Hypertension    Influenza A 07/08/2017   Left wrist pain 03/10/2013   Migraine    last one was 04/18/13   Sleep apnea    does not use CPAP   Trichomonas contact, treated    Pulse 97   Ht 5' (1.524 m)   Wt 165 lb (74.8 kg)   SpO2 95%   BMI 32.22 kg/m   Opioid Risk Score:   Fall Risk Score:  `1  Depression screen [facility] 2/9     07/28/2022    9:09 AM 05/12/2022    3:06 PM 03/03/2022    4:43 PM 12/04/2021    4:42 PM 09/11/2021    9:06 AM 08/25/2021    1:47 PM 05/26/2021    4:37 PM  Depression screen PHQ 2/9  Decreased Interest 0 0 0 0 0 0 0  Down, Depressed, Hopeless 0 0 0 0 0 0 0  PHQ - 2 Score 0 0 0 0 0 0 0  Altered sleeping  1 1 1 $ 0  0  Tired, decreased energy  1 1 1 $ 0  1  Change  in appetite  0 0 0 0  0  Feeling bad or failure about yourself   0 0 0 0  0  Trouble concentrating  0 0 0 0  0  Moving slowly or fidgety/restless  0 0 0 0  0  Suicidal thoughts  0 0 0 0  0  PHQ-9 Score  2 2 2 $ 0  1  Difficult doing work/chores  Not difficult at all Not difficult at all  Not difficult at all  Not difficult at all    Review of Systems  Constitutional: Negative.   HENT: Negative.    Eyes: Negative.   Respiratory: Negative.    Cardiovascular: Negative.   Gastrointestinal:  Positive for constipation and nausea.  Endocrine: Negative.   Genitourinary: Negative.   Musculoskeletal:  Positive for arthralgias, back pain, myalgias and neck pain.       Spasms   Skin: Negative.   Neurological:  Positive for headaches.  Hematological: Negative.   All other systems reviewed and are negative.      Objective:   Physical Exam Gen: no distress, normal appearing, 165 lbs, BMI 32.22 HEENT: oral mucosa pink and moist, NCAT Cardio: Reg rate Chest: normal effort, normal rate of breathing Abd: soft, non-distended Ext: no edema Psych: pleasant, normal affect Skin: intact Neuro: Alert and oriented x3.  Musculoskeletal: Diffuse tenderness in multiple joints on both sides of the body. 5/5 strength in both upper and lower extremities with the exception of 4/5 DF/PF in left foot (prior surgery) Psych: pleasant, normal affect     Assessment & Plan:  Sherry [name] is a 62 year old woman who presents to establish care for fibromyalgia with worsening symptoms. She also has weakness and fatigue.   Fibromyalgia is a clinical syndrome characterized by widespread pain and tenderness in addition to a variety of symptoms, including fatigue, anxiety, depression, and sleep disturbances. Fibromyalgia affects 3-5% of women and up to 25% of women with other rheumatological conditions.   Exercise is a first-line treatment for the disease, and can help with both the physical and emotional symptoms, as  well as improving overall health and function.   -She tends to have worst symptoms in the winter, the cold worsens her symptoms -Commended her idea to bring her a heating pad at work.  -Pain is currently worse in her lower back and bilateral lower extremities.  -She is motivated to continue working and works 40 hours per week -She has failed all neuropathic medications. Discussed side effects of Savella and she chose not to try this. Discussed side effects of tylenol with codeine and she prefers this option. Advised to use for only severe pain.  -Provided referral for aquatic therapy.  -Pain contract and UDS signed previously -discussed topamax but she defers -Discussed current symptoms of pain and history of pain.  -Discussed benefits of exercise in reducing pain. -Discussed following foods that may reduce pain: 1) Ginger (especially studied for arthritis)- reduce leukotriene production to decrease inflammation 2) Blueberries- high in phytonutrients that decrease inflammation 3) Salmon- marine omega-3s reduce joint swelling and pain 4) Pumpkin seeds- reduce inflammation 5) dark chocolate- reduces inflammation 6) turmeric- reduces inflammation 7) tart cherries - reduce pain and stiffness 8) extra virgin olive oil - its compound olecanthal helps to block prostaglandins  9) chili peppers- can be eaten or applied topically via capsaicin 10) mint- helpful for  headache, muscle aches, joint pain, and itching 11) garlic- reduces inflammation  Link to further information on diet for chronic pain: [email or web address]   Obesity (BMI 34.26), 175 lbs.  -Our goal is to increase her exercise, improve her nutrition.  -Provided exercise and dietary counseling.   -Provided referral for aquatic therapy.   -Meloxicam did not help.  Myofascial pain syndrome -Provided script for Lidocaine 5% patch. Can consider trigger point  injections for myofascial pain in future with lidocaine and saline  -She walks a lot as part of her work  -discussed trigger point injections but she defers  -She does not have much appetite. She has been trying to drink more water. She likes salmon and seafood. She loves stewed chicken.    Her goal is to be more active so she can spend more time with her grandchildren.   All questions answered. RTC in 1 month for f/I   Muscle cramps -recommended magnesium supplement at night.

## 2022-07-28 NOTE — Patient Instructions (Signed)
Foods that may reduce pain: 1) Ginger (especially studied for arthritis)- reduce leukotriene production to decrease inflammation 2) Blueberries- high in phytonutrients that decrease inflammation 3) Salmon- marine omega-3s reduce joint swelling and pain 4) Pumpkin seeds- reduce inflammation 5) dark chocolate- reduces inflammation 6) turmeric- reduces inflammation 7) tart cherries - reduce pain and stiffness 8) extra virgin olive oil - its compound olecanthal helps to block prostaglandins  9) chili peppers- can be eaten or applied topically via capsaicin 10) mint- helpful for headache, muscle aches, joint pain, and itching 11) garlic- reduces inflammation  Link to further information on diet for chronic pain: [email or web address]

## 2022-07-29 ENCOUNTER — Other Ambulatory Visit: Payer: Self-pay | Admitting: Physical Medicine and Rehabilitation

## 2022-07-29 ENCOUNTER — Encounter: Payer: Self-pay | Admitting: Student

## 2022-07-29 ENCOUNTER — Telehealth: Payer: Self-pay | Admitting: *Deleted

## 2022-07-29 ENCOUNTER — Other Ambulatory Visit (HOSPITAL_COMMUNITY): Payer: Self-pay

## 2022-07-29 MED ORDER — OXYCODONE-ACETAMINOPHEN 5-325 MG PO TABS
1.0000 | ORAL_TABLET | Freq: Two times a day (BID) | ORAL | 0 refills | Status: DC | PRN
  Filled 2022-07-29: qty 60, 30d supply, fill #0

## 2022-07-29 NOTE — Telephone Encounter (Signed)
Patient notified rx sent to [facility] Nothing further neede.d

## 2022-07-29 NOTE — Telephone Encounter (Signed)
Patient called about rx Oxycodone 5-325 being sent to wrong pharmacy [facility]. I have called and spoke with pharmacist Mitzi Hansen and the rx has been cancelled. Please send rx to [facility].

## 2022-07-30 ENCOUNTER — Other Ambulatory Visit (HOSPITAL_COMMUNITY): Payer: Self-pay

## 2022-07-30 NOTE — Telephone Encounter (Signed)
Prior Auth for patients medication TRULICITY denied by [facility] via CoverMyMeds.   Reason:   POSSIBLE ALTERNATIVE: METFORMIN   CoverMyMeds Key: [id number]

## 2022-07-30 NOTE — Telephone Encounter (Signed)
Called pharmacy and asked for patients insurance, received insurance. Resubmitted prior authorization 07/21/22 per cover my meds patiens inactive.

## 2022-07-30 NOTE — Telephone Encounter (Signed)
A Prior Authorization was initiated for this patients TRULICITY through CoverMyMeds.   Key: [id number]

## 2022-08-03 ENCOUNTER — Other Ambulatory Visit (HOSPITAL_COMMUNITY): Payer: Self-pay

## 2022-08-03 ENCOUNTER — Other Ambulatory Visit: Payer: Self-pay

## 2022-08-03 ENCOUNTER — Telehealth: Payer: Self-pay

## 2022-08-03 ENCOUNTER — Ambulatory Visit: Payer: 59 | Admitting: Student

## 2022-08-03 ENCOUNTER — Encounter: Payer: Self-pay | Admitting: Student

## 2022-08-03 VITALS — BP 128/83 | HR 93 | Temp 98.5°F | Resp 24 | Ht 60.0 in | Wt 169.4 lb

## 2022-08-03 DIAGNOSIS — E119 Type 2 diabetes mellitus without complications: Secondary | ICD-10-CM

## 2022-08-03 DIAGNOSIS — E11649 Type 2 diabetes mellitus with hypoglycemia without coma: Secondary | ICD-10-CM

## 2022-08-03 DIAGNOSIS — Z794 Long term (current) use of insulin: Secondary | ICD-10-CM

## 2022-08-03 DIAGNOSIS — M159 Polyosteoarthritis, unspecified: Secondary | ICD-10-CM

## 2022-08-03 DIAGNOSIS — M19072 Primary osteoarthritis, left ankle and foot: Secondary | ICD-10-CM | POA: Diagnosis not present

## 2022-08-03 LAB — POCT GLYCOSYLATED HEMOGLOBIN (HGB A1C): Hemoglobin A1C: 7.5 % — AB (ref 4.0–5.6)

## 2022-08-03 LAB — GLUCOSE, CAPILLARY: Glucose-Capillary: 138 mg/dL — ABNORMAL HIGH (ref 70–99)

## 2022-08-03 MED ORDER — TRULICITY 0.75 MG/0.5ML ~~LOC~~ SOAJ
0.7500 mg | SUBCUTANEOUS | 1 refills | Status: DC
  Filled 2022-08-03 – 2022-08-31 (×2): qty 2, 28d supply, fill #0
  Filled 2022-09-29 – 2022-10-14 (×2): qty 2, 28d supply, fill #1

## 2022-08-03 MED ORDER — ONETOUCH VERIO VI STRP
ORAL_STRIP | 12 refills | Status: DC
  Filled 2022-08-03: qty 50, 25d supply, fill #0

## 2022-08-03 NOTE — Patient Instructions (Addendum)
Thank you so much for coming to the clinic today!   I have sent in the trulicity, as well as the test strips. Please let us know if there is an issue getting it. I have also placed the referral for physical therapy, and I think they can help greatly.   If you have any questions please feel free to the call the clinic at anytime at [phone]. It was a pleasure seeing you!  Best, Dr. Sanjuana Mae

## 2022-08-03 NOTE — Telephone Encounter (Signed)
Pt is requesting a call back ... She had an appt with Dr Nooruddin this Am @ 9:15  .Marland Kitchen She is calling back because she stated she forgot to  tell him she has been having chest pains and wanted to know if it had to do with anxiety due to her dad in the hospital  or something else

## 2022-08-03 NOTE — Assessment & Plan Note (Signed)
Patient presents for checkup for diabetes.  A1c is increased from 7.1, to 7.5.  Her only medication right now for diabetes management is Trulicity [id number] weekly.  However due to some insurance restraints, and prior authorization issues she was unable to get it.  She states that insurance has approved her Trulicity.  Will restart this at 0.75, and next follow-up appointment can uptitrate if necessary if patient is able to receive the medication and is compliant with it.  Was also able to refill her Verio test strips

## 2022-08-03 NOTE — Assessment & Plan Note (Signed)
Patient has confirmed osteoarthritis of multiple joints including her right foot and bilateral knees.  She remains in a good amount of pain, which could be related to osteoarthritis as well as her fibromyalgia.  She follows with the pain medicine clinic for which they have given her Flexeril, and Percocet to take for breakthrough pain.  She is compliant with her regimen, and states that her pain is controlled most of the time.  She has not been to physical therapy for her osteoarthritis, will place referral as I believe this could help her at least minimize some of her pain and help her regain some strength.

## 2022-08-04 ENCOUNTER — Telehealth: Payer: Self-pay | Admitting: *Deleted

## 2022-08-04 NOTE — Telephone Encounter (Signed)
Sherry [name] notes for the last week she has had periodic episodes of chest pressure that radiate into her neck and down her right arm. They occurred while she was at work exerting herself and at home while she was resting. She denies any associated dyspnea, but does feel flushed. Nothing makes the pain better or worse.   Will reach out to triage to see if we have any appointments today, with her cardiac risk factors will check EKG. Discussed if pain occurs and does not improve she present to the ED for further evaluation.

## 2022-08-04 NOTE — Progress Notes (Signed)
CC: A1c follow up  HPI:  [name] is a 62 y.o. female living with a history stated below and presents today for A1c follow up. Please see problem based assessment and plan for additional details.  Past Medical History:  Diagnosis Date   Acid reflux disease    Anginal pain ([place])    last time 04/18/13 in afternoon; referred to 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 Swallow whole.     aspirin-acetaminophen-caffeine (EXCEDRIN MIGRAINE) 250-250-65 MG tablet Take 2 tablets by mouth every 6 (six) hours as needed for headache.     cyclobenzaprine (FLEXERIL) 10 MG tablet Take 1 tablet (10 mg total) by mouth 2 (two) times daily as needed. 40 tablet 0   diclofenac Sodium (VOLTAREN) 1 % GEL Apply 2 grams topically 4 (four) times daily. 100 g 0   docusate sodium (COLACE) 100 MG capsule Take 1 capsule (100 mg total) by mouth every 12 (twelve) hours. 60 capsule 0   ESOMEPRAZOLE MAGNESIUM PO Take by mouth. (Patient not taking: Reported on 07/28/2022)     Ferrous Sulfate (IRON PO) Take by mouth.  (Patient not taking: Reported on 07/28/2022)     Lancets (ONETOUCH DELICA PLUS [id number]) MISC USE 1 TO CHECK BLOOD GLUCOSE BEFORE AND 2 HOURS AFTER A MEAL TO SEE HOW FOOD AFFECTS BLOOD SUGAR ONCE DAILY. (Patient not taking: Reported on 07/28/2022) 100 each 5   Lancets MISC USE 1 TO CHECK BLOOD GLUCOSE  BEFORE AND 2 HOURS AFTER A MEAL TO SEE HOW FOOD AFFECTS BLOOD SUGAR ONCE DAILY. (Patient not taking: Reported on 07/28/2022)     lisinopril-hydrochlorothiazide (ZESTORETIC) 20-25 MG tablet Take 1 tablet by mouth daily. 90 tablet 0   meloxicam (MOBIC) 15 MG tablet Take 1 tablet (15 mg total) by mouth daily. 30 tablet 0   oxyCODONE-acetaminophen (PERCOCET) 5-325 MG tablet Take 1 tablet by mouth 2 (two) times daily as needed for severe pain. 60 tablet 0   pantoprazole (PROTONIX) 40 MG tablet Take 1 tablet (40 mg total) by mouth 2 (two) times daily. 120 tablet 2   polyethylene glycol powder (MIRALAX) 17 GM/SCOOP powder Take 1 capful (17 g total) and mix in 8 oz of clear liquid and drink by mouth daily as needed for moderate constipation or severe constipation. 510 g 0   promethazine (PHENERGAN) 12.5 MG tablet Take 1 tablet (12.5 mg total) by mouth every 8 (eight) hours as needed for nausea or vomiting. 20 tablet 0   simvastatin (ZOCOR) 40 MG tablet Take 1 tablet (40 mg total) by mouth at bedtime. 90 tablet 1   tiZANidine (ZANAFLEX) 4 MG tablet Take 1 tablet (4 mg total) by mouth at bedtime. 30 tablet  0   No current facility-administered medications on file prior to visit.    Family History  Problem Relation Age of Onset   Diabetes Mother    Hypertension Mother    Depression Mother    Diabetes Sister    Hypertension Father    Cancer Father        Prostate   Alcohol abuse Father    Cancer Brother        Prostate   Breast cancer Neg Hx    Colon cancer Neg Hx    Colon polyps Neg Hx    Esophageal cancer Neg Hx    Stomach cancer Neg Hx    Rectal cancer Neg Hx     Social History   Socioeconomic History   Marital status: Single    Spouse name: Not on file   Number of children: 2   Years of education: Not on file   Highest education level: 12th grade  Occupational History   Occupation: Unemployed  Tobacco Use   Smoking status: Never   Smokeless tobacco: Never  Vaping Use   Vaping  Use: Never used  Substance and Sexual Activity   Alcohol use: No   Drug use: No   Sexual activity: Yes    Birth control/protection: None    Comment: last intercouse two months ago  Other Topics Concern   Not on file  Social History Narrative   Lives with   2 Children: Ages 71 and 58   4 Grandchildren: 2, 3, 4 and 71   Brother      Diet: "Barely eats". Does not exercise due to pain.      03/2011- Currently unemployed. 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 Unsure of last pap.   Unsure of last Td   Unsure of last flu shot   Social Determinants of Health   Financial Resource Strain: Not on file  Food Insecurity: No Food Insecurity (09/11/2021)   Hunger Vital Sign    Worried About Running Out of Food in the Last Year: Never true    Ran Out of Food in the Last Year: Never true  Transportation Needs: No Transportation Needs (09/11/2021)   PRAPARE - Hydrologist (Medical): No    Lack of Transportation (Non-Medical): No  Physical Activity: Not on file  Stress: Not on file  Social Connections: Not on file  Intimate Partner Violence: Not on file    Review of Systems: ROS negative except for what is noted on the assessment and plan.  Vitals:   08/03/22 0915  BP: 128/83  Pulse: 93  Resp: (!) 24  Temp: 98.5 F (36.9 C)  TempSrc: Oral  Weight: 169 lb 6.4 oz (76.8 kg)  Height: 5' (1.524 m)    Physical Exam: Constitutional: well-appearing female sitting in chair, in no acute distress Neck: supple Cardiovascular: regular rate and rhythm, no m/r/g Pulmonary/Chest: normal work of breathing on room air, lungs clear to auscultation bilaterally MSK: normal bulk and tone, tenderness to palpation on bilateral knees, ROM limited by pain Neurological: alert & oriented x 3, 5/5 strength in bilateral upper and lower extremities, normal gait   Assessment & Plan:   Type 2 diabetes mellitus  ([place]) Patient presents for checkup for diabetes.  A1c is increased from 7.1, to 7.5.  Her only medication right now for diabetes management is Trulicity A999333 weekly.  However due to some  insurance restraints, and prior authorization issues she was unable to get it.  She states that insurance has approved her Trulicity.  Will restart this at 0.75, and next follow-up appointment can uptitrate if necessary if patient is able to receive the medication and is compliant with it.  Was also able to refill her Verio test strips  Osteoarthritis of multiple joints Patient has confirmed osteoarthritis of multiple joints including her right foot and bilateral knees.  She remains in a good amount of pain, which could be related to osteoarthritis as well as her fibromyalgia.  She follows with the pain medicine clinic for which they have given her Flexeril, and Percocet to take for breakthrough pain.  She is compliant with her regimen, and states that her pain is controlled most of the time.  She has not been to physical therapy for her osteoarthritis, will place referral as I believe this could help her at least minimize some of her pain and help her regain some strength.  Patient discussed with Dr. Leonard Downing Rogenia Werntz, M.D. [facility], PGY-1 Phone: [phone] Date 08/04/2022 Time 8:32 AM

## 2022-08-04 NOTE — Telephone Encounter (Signed)
Pt was called - stated the pain her chest comes and goes; she thinks it may be d/t anxiety. I asked if the pain radiates - she stated maybe to her neck. Stated she's in [place], at work, cannot come today. Appt schedule w/Dr Collene Gobble tomorrow 2/28 @ 1515PM.

## 2022-08-04 NOTE — Telephone Encounter (Signed)
-----   Message from Riesa Pope, MD sent at 08/04/2022 10:03 AM EST ----- Regarding: Chest pain follow up Do we have any open slots to see Ms. Fillingham today for this chest pain? She forgot to mention this to Dr. Sanjuana Mae yesterday. Thanks   [employer]

## 2022-08-05 ENCOUNTER — Ambulatory Visit (HOSPITAL_COMMUNITY)
Admission: RE | Admit: 2022-08-05 | Discharge: 2022-08-05 | Disposition: A | Discharge status: Home / Self Care | Payer: No Typology Code available for payment source | Source: Ambulatory Visit | Attending: Internal Medicine | Admitting: Internal Medicine

## 2022-08-05 ENCOUNTER — Ambulatory Visit: Payer: 59 | Admitting: Student

## 2022-08-05 ENCOUNTER — Other Ambulatory Visit: Payer: Self-pay

## 2022-08-05 ENCOUNTER — Encounter: Payer: Self-pay | Admitting: Student

## 2022-08-05 VITALS — BP 146/84 | HR 80 | Temp 98.2°F | Ht 60.0 in | Wt 169.6 lb

## 2022-08-05 DIAGNOSIS — R079 Chest pain, unspecified: Secondary | ICD-10-CM | POA: Diagnosis not present

## 2022-08-05 DIAGNOSIS — R0789 Other chest pain: Secondary | ICD-10-CM | POA: Diagnosis not present

## 2022-08-05 NOTE — Patient Instructions (Signed)
Sherry [name], it was a pleasure seeing you today!  Today we discussed: - Chest pain: I would like for you to see cardiology for a stress test. If the pain does not stop or if you develop other symptoms, please visit the Emergency Department as soon as possible.   Referrals ordered today:    Referral Orders         Ambulatory referral to Cardiology      Follow-up:  1 month    Please make sure to arrive 15 minutes prior to your next appointment. If you arrive late, you may be asked to reschedule.   We look forward to seeing you next time. Please call our clinic at [phone] if you have any questions or concerns. The best time to call is Monday-Friday from 9am-4pm, but there is someone available 24/7. If after hours or the weekend, call the main hospital number and ask for the Internal Medicine Resident On-Call. If you need medication refills, please notify your pharmacy one week in advance and they will send [place] a request.  Thank you for letting us take part in your care. Wishing you the best!  Thank you, Sanjuan Dame, MD

## 2022-08-06 NOTE — Progress Notes (Signed)
   CC: chest pains  HPI:  [name] is a 62 y.o. person with medical history as below presenting to [facility] for chest pains.   Please see problem-based list for further details, assessments, and plans.  Past Medical History:  Diagnosis Date   Acid reflux disease    Anginal pain ([place])    last time 04/18/13 in afternoon; referred to [name]   Anxiety    Cancer [facility])    uterine- had hysterectomy   Chest pain 04/07/2011   Chronic pain    Chronic pain 06/19/2014   Cough 09/25/2011   Depression    Fast heart beat    Fibromyalgia    Headache(784.0) 12/27/2012   History of blood transfusion 1981   childbirth   History of kidney stones    passed   Hypertension    Influenza A 07/08/2017   Left wrist pain 03/10/2013   Migraine    last one was 04/18/13   Sleep apnea    does not use CPAP   Trichomonas contact, treated    Review of Systems:  As per HPI  Physical Exam:  Vitals:   08/05/22 1547  BP: (!) 146/84  Pulse: 80  Temp: 98.2 F (36.8 C)  TempSrc: Oral  SpO2: 100%  Weight: 169 lb 9.6 oz (76.9 kg)  Height: 5' (1.524 m)   General: Resting comfortably in no acute distress CV: Regular rate, rhythm. No murmurs appreciated. Radial pulses 2+ bilaterally. No chest tenderness upon palpation. Pulm: Normal work of breathing on room air. Clear to auscultation bilaterally.  MSK: Normal bulk, tone. No peripheral edema. Skin: Warm, dry. No rashes or lesions. Neuro: Awake, alert, conversing appropriately. Grossly non-focal.  Psych: Normal mood, affect, speech.  Assessment & Plan:   Atypical chest pain Patient is presenting to [facility] for roughly two weeks of intermittent chest pain. She describes mid-sternal and dull chest pain. Further notes these episodes occur typically a few times daily and are not associated with movement or exertion. These typically self-resolve within a few minutes - she denies any associated with resolution and rest. She has not taken any  medications for this. She denies any associated dyspnea, palpitations, nausea, vomiting, vision changes, focal weakness. She denies any significant events in her life recently and states she does not feel like she has been anxious overall. She was unsure what could be causing her pain, but thought some anxiety might be contributing.   Today in the clinic we obtained an ECG with revealed incomplete RBBB, similar to previous ECG's. Patient is high-risk given her co-morbidities (hypertension, age, obesity, diabetes). Will plan to send to cardiology for stress testing, patient is agreeable to plan.  - Referral to cardiology for stress testing - Return precautions given  Patient discussed with Dr. Kathrynn Ducking, MD Internal Medicine PGY-3 Pager: [phone]

## 2022-08-06 NOTE — Assessment & Plan Note (Addendum)
Patient is presenting to [facility] for roughly two weeks of intermittent chest pain. She describes mid-sternal and dull chest pain. Further notes these episodes occur typically a few times daily and are not associated with movement or exertion. These typically self-resolve within a few minutes - she denies any associated with resolution and rest. She has not taken any medications for this. She denies any associated dyspnea, palpitations, nausea, vomiting, vision changes, focal weakness. She denies any significant events in her life recently and states she does not feel like she has been anxious overall. She was unsure what could be causing her pain, but thought some anxiety might be contributing.   Today in the clinic we obtained an ECG with revealed incomplete RBBB, similar to previous ECG's. Patient is high-risk given her co-morbidities (hypertension, age, obesity, diabetes). Will plan to send to cardiology for stress testing, patient is agreeable to plan.  - Referral to cardiology for stress testing - Return precautions given

## 2022-08-10 NOTE — Progress Notes (Signed)
Internal Medicine Clinic Attending  Case discussed with Dr. Nguyen  At the time of the visit.  We reviewed the resident's history and exam and pertinent patient test results.  I agree with the assessment, diagnosis, and plan of care documented in the resident's note. 

## 2022-08-10 NOTE — Addendum Note (Signed)
Addended by: Jodean Lima on: 08/10/2022 03:39 PM   Modules accepted: Level of Service

## 2022-08-11 ENCOUNTER — Ambulatory Visit: Payer: 59 | Attending: Cardiology | Admitting: Cardiology

## 2022-08-11 NOTE — Progress Notes (Deleted)
Cardiology Office Note:    Date:  08/11/2022   ID:  Sherry [name], DOB 1961/05/12, MRN [id number]  PCP:  Farrel Gordon, DO  Cardiologist:  None  Electrophysiologist:  None   Referring MD: Farrel Gordon, DO   No chief complaint on file. ***  History of Present Illness:    Sherry [name] is a 62 y.o. female with a hx of uterine cancer status post hysterectomy, GERD, fibromyalgia, hypertension, OSA not on CPAP who is referred by Dr. Marlou Sa for evaluation of chest pain.  Echocardiogram in 2014 showed EF 60 to 65%, no significant valvular disease.  Past Medical History:  Diagnosis Date   Acid reflux disease    Anginal pain ([place])    last time 04/18/13 in afternoon; referred to [name]   Anxiety    Cancer [facility])    uterine- had hysterectomy   Chest pain 04/07/2011   Chronic pain    Chronic pain 06/19/2014   Cough 09/25/2011   Depression    Fast heart beat    Fibromyalgia    Headache(784.0) 12/27/2012   History of blood transfusion 1981   childbirth   History of kidney stones    passed   Hypertension    Influenza A 07/08/2017   Left wrist pain 03/10/2013   Migraine    last one was 04/18/13   Sleep apnea    does not use CPAP   Trichomonas contact, treated     Past Surgical History:  Procedure Laterality Date   CHOLECYSTECTOMY     2008   ORIF CALCANEOUS FRACTURE Left 05/16/2013   Procedure: OPEN REDUCTION INTERNAL FIXATION (ORIF) LEFT CALCANEOUS FRACTURE;  Surgeon: Rozanna Box, MD;  Location: [place];  Service: Orthopedics;  Laterality: Left;   TOTAL ABDOMINAL HYSTERECTOMY     1986   WISDOM TOOTH EXTRACTION      Current Medications: No outpatient medications have been marked as taking for the 08/11/22 encounter (Appointment) with Donato Heinz, MD.     Allergies:   Duloxetine, Duloxetine hcl, Pregabalin, and Gabapentin   Social History   Socioeconomic History   Marital status: Single    Spouse name: Not on file   Number of children: 2   Years  of education: Not on file   Highest education level: 12th grade  Occupational History   Occupation: Unemployed  Tobacco Use   Smoking status: Never   Smokeless tobacco: Never  Vaping Use   Vaping Use: Never used  Substance and Sexual Activity   Alcohol use: No   Drug use: No   Sexual activity: Yes    Birth control/protection: None    Comment: last intercouse two months ago  Other Topics Concern   Not on file  Social History Narrative   Lives with   2 Children: Ages 64 and 70   4 Grandchildren: 2, 3, 4 and 36   Brother      Diet: "Barely eats". Does not exercise due to pain.      03/2011- Currently unemployed. 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 Unsure of last pap.   Unsure of last Td   Unsure of last flu shot   Social Determinants of Health   Financial Resource Strain: Not on file  Food Insecurity: No Food Insecurity (09/11/2021)   Hunger Vital Sign    Worried About Running Out of Food in the Last Year: Never true  Ran Out of Food in the Last Year: Never true  Transportation Needs: No Transportation Needs (09/11/2021)   PRAPARE - Hydrologist (Medical): No    Lack of Transportation (Non-Medical): No  Physical Activity: Not on file  Stress: Not on file  Social Connections: Not on file     Family History: The patient's ***family history includes Alcohol abuse in her father; Cancer in her brother and father; Depression in her mother; Diabetes in her mother and sister; Hypertension in her father and mother. There is no history of Breast cancer, Colon cancer, Colon polyps, Esophageal cancer, Stomach cancer, or Rectal cancer.  ROS:   Please see the history of present illness.    *** All other systems reviewed and are negative.  EKGs/Labs/Other Studies Reviewed:    The following studies were reviewed today: ***  EKG:  EKG is *** ordered today.  The ekg ordered today demonstrates  ***  Recent Labs: 12/04/2021: ALT 20 05/12/2022: BUN 8; Creatinine, Ser 0.77; Potassium 4.0; Sodium 137  Recent Lipid Panel    Component Value Date/Time   CHOL 163 05/12/2022 1535   TRIG 77 05/12/2022 1535   HDL 43 05/12/2022 1535   CHOLHDL 3.8 05/12/2022 1535   CHOLHDL 5.3 08/24/2013 1016   VLDL 26 08/24/2013 1016   LDLCALC 105 (H) 05/12/2022 1535    Physical Exam:    VS:  There were no vitals taken for this visit.    Wt Readings from Last 3 Encounters:  08/05/22 169 lb 9.6 oz (76.9 kg)  08/03/22 169 lb 6.4 oz (76.8 kg)  07/28/22 165 lb (74.8 kg)     GEN: *** Well nourished, well developed in no acute distress HEENT: Normal NECK: No JVD; No carotid bruits LYMPHATICS: No lymphadenopathy CARDIAC: ***RRR, no murmurs, rubs, gallops RESPIRATORY:  Clear to auscultation without rales, wheezing or rhonchi  ABDOMEN: Soft, non-tender, non-distended MUSCULOSKELETAL:  No edema; No deformity  SKIN: Warm and dry NEUROLOGIC:  Alert and oriented x 3 PSYCHIATRIC:  Normal affect   ASSESSMENT:    No diagnosis found. PLAN:    Chest pain:  Hypertension: On lisinopril-HCTZ 20-25 mg daily  Hyperlipidemia: On simvastatin 40 mg daily  T2DM: A1c 7.5%.  OSA: Not on CPAP  RTC in***  Medication Adjustments/Labs and Tests Ordered: Current medicines are reviewed at length with the patient today.  Concerns regarding medicines are outlined above.  No orders of the defined types were placed in this encounter.  No orders of the defined types were placed in this encounter.   There are no Patient Instructions on file for this visit.   Signed, Donato Heinz, MD  08/11/2022 5:45 AM    [place]

## 2022-08-14 NOTE — Progress Notes (Signed)
Internal Medicine Clinic Attending  Case discussed with Dr. Nooruddin  At the time of the visit.  We reviewed the resident's history and exam and pertinent patient test results.  I agree with the assessment, diagnosis, and plan of care documented in the resident's note.  

## 2022-08-17 NOTE — Progress Notes (Signed)
Internal Medicine Clinic Attending  Case discussed with Dr. Sanjuana Mae  At the time of the visit.  We reviewed the resident's history and exam and pertinent patient test results.  I agree with the assessment, diagnosis, and plan of care documented in the resident's note. To clarify, Patient had not been taking the prescribed Trulicity due to insurance issues.  Now that her insurance will cover the medication, she will be able to begin taking it.

## 2022-08-24 NOTE — Progress Notes (Signed)
Subjective:    Patient ID: Sherry [name], female    DOB: [birth year], 61 y.o.   MRN: [id number]  HPI  Sherry [name] is a 62 year old female who presents for f/u of her fibromyalgia.   1) Fibromyalgia -has been hurting Her pain has been getting worse.  -she knows it will never go away -she works about 40 hours per week -her legs hurt very badly and her gait is impaired when the pain is severe -she has no husband  2) Muscle cramps: -she has been taking mustard, she can't take too much because it upsets her stomach -wakes up with this -she can't take too many bananas due to their potassium  3) Stress due to illness of family member -father was diagnosed with stage 3 lung cancer -she took on a part time job so she can have extra money to go visit him -she finds her sister does not tell the truth even though the doctor told her the diagnosis -doctor does not want to operate  -he is going through chemo and radiation.   She takes Tylenol but it doesn't help.   She took Hydrocodone and Percocet before. She was taking these as needed- waiting until the pain is very severe.   Prior history: She is currently has a headache. She gets these often. She is not interested in Botox. She takes Excedrin as needed.  She also has a lot of muscle tightness.  Time helps the pain.  She has had fibromyalgia for may years. She used to follow with Dr. Naaman Plummer. She uses a heating pad which does provide relief. Pain medications take the edge off the pain. She is unable to tolerate family retreats in [place] because of the cold. She has been in worse pain since last visit. Ice does not help.   She takes Ibuprofen as she cannot tolerate the Tylenol. Sometimes this helps and sometimes it doesn't. She takes about 5-6 of the Ibuprofen. She takes it more when she gets home when her pain is worse.   She wants to exercise to lose some weights. Her grandkids are 21 to 4- she has 33 grandchildren and 4 great  grandkids. Weight is currently 175 lbs, BMI 32.26.   She wants to date. She wants to be young again.   She did not tolerate Lyrica or Cymbalta well. Gabapentin made her gain weight. She cannot recall her response to Amitriptyline.   Average pain is 10/10, pain right now is 7/10 and feels aching on both sides of her body in multiple joints and muscles.   Pain Inventory Average Pain 9 Pain Right Now 8 My pain is aching  In the last 24 hours, has pain interfered with the following? General activity 4 Relation with others 0 Enjoyment of life 0 What TIME of day is your pain at its worst? Morning,daytime, evening, night Sleep (in general) Fair  Pain is worse with: walking, bending, sitting, inactivity, and standing Pain improves with: rest, heat/ice, and medication Relief from Meds: 8   Family History  Problem Relation Age of Onset   Diabetes Mother    Hypertension Mother    Depression Mother    Diabetes Sister    Hypertension Father    Cancer Father        Prostate   Alcohol abuse Father    Cancer Brother        Prostate   Breast cancer Neg Hx    Colon cancer Neg Hx    Colon  polyps Neg Hx    Esophageal cancer Neg Hx    Stomach cancer Neg Hx    Rectal cancer Neg Hx    Social History   Socioeconomic History   Marital status: Single    Spouse name: Not on file   Number of children: 2   Years of education: Not on file   Highest education level: 12th grade  Occupational History   Occupation: Unemployed  Tobacco Use   Smoking status: Never   Smokeless tobacco: Never  Vaping Use   Vaping Use: Never used  Substance and Sexual Activity   Alcohol use: No   Drug use: No   Sexual activity: Yes    Birth control/protection: None    Comment: last intercouse two months ago  Other Topics Concern   Not on file  Social History Narrative   Lives with   2 Children: Ages 67 and 28   4 Grandchildren: 2, 3, 4 and 41   Brother      Diet: "Barely eats". Does not exercise due  to pain.      03/2011- Currently unemployed. 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 Unsure of last pap.   Unsure of last Td   Unsure of last flu shot   Social Determinants of Health   Financial Resource Strain: Not on file  Food Insecurity: No Food Insecurity (09/11/2021)   Hunger Vital Sign    Worried About Running Out of Food in the Last Year: Never true    Ran Out of Food in the Last Year: Never true  Transportation Needs: No Transportation Needs (09/11/2021)   PRAPARE - Hydrologist (Medical): No    Lack of Transportation (Non-Medical): No  Physical Activity: Not on file  Stress: Not on file  Social Connections: Not on file   Past Surgical History:  Procedure Laterality Date   CHOLECYSTECTOMY     2008   ORIF CALCANEOUS FRACTURE Left 05/16/2013   Procedure: OPEN REDUCTION INTERNAL FIXATION (ORIF) LEFT CALCANEOUS FRACTURE;  Surgeon: Rozanna Box, MD;  Location: [place];  Service: Orthopedics;  Laterality: Left;   TOTAL ABDOMINAL HYSTERECTOMY     1986   WISDOM TOOTH EXTRACTION     Past Medical History:  Diagnosis Date   Acid reflux disease    Anginal pain ([place])    last time 04/18/13 in afternoon; referred to [name]   Anxiety    Cancer [facility])    uterine- had hysterectomy   Chest pain 04/07/2011   Chronic pain    Chronic pain 06/19/2014   Cough 09/25/2011   Depression    Fast heart beat    Fibromyalgia    Headache(784.0) 12/27/2012   History of blood transfusion 1981   childbirth   History of kidney stones    passed   Hypertension    Influenza A 07/08/2017   Left wrist pain 03/10/2013   Migraine    last one was 04/18/13   Sleep apnea    does not use CPAP   Trichomonas contact, treated    BP 127/83   Pulse 95   Ht 5' (1.524 m)   Wt 164 lb 6.4 oz (74.6 kg)   SpO2 96%   BMI 32.11 kg/m   Opioid Risk Score:   Fall Risk Score:  `1  Depression screen [facility] 2/9      08/28/2022    9:37  AM 08/03/2022    9:28 AM 07/28/2022    9:09 AM 05/12/2022    3:06 PM 03/03/2022    4:43 PM 12/04/2021    4:42 PM 09/11/2021    9:06 AM  Depression screen PHQ 2/9  Decreased Interest 0 0 0 0 0 0 0  Down, Depressed, Hopeless 0 0 0 0 0 0 0  PHQ - 2 Score 0 0 0 0 0 0 0  Altered sleeping    1 1 1  0  Tired, decreased energy    1 1 1  0  Change in appetite    0 0 0 0  Feeling bad or failure about yourself     0 0 0 0  Trouble concentrating    0 0 0 0  Moving slowly or fidgety/restless    0 0 0 0  Suicidal thoughts    0 0 0 0  PHQ-9 Score    2 2 2  0  Difficult doing work/chores    Not difficult at all Not difficult at all  Not difficult at all    Review of Systems  Constitutional: Negative.   HENT: Negative.    Eyes: Negative.   Respiratory: Negative.    Cardiovascular: Negative.   Gastrointestinal:  Positive for constipation and nausea.  Endocrine: Negative.   Genitourinary: Negative.   Musculoskeletal:  Positive for arthralgias, back pain, myalgias and neck pain.       Spasms   Skin: Negative.   Neurological:  Positive for headaches.  Hematological: Negative.   All other systems reviewed and are negative.      Objective:   Physical Exam Gen: no distress, normal appearing, 165 lbs, BMI 32.22 HEENT: oral mucosa pink and moist, NCAT Cardio: Reg rate Chest: normal effort, normal rate of breathing Abd: soft, non-distended Ext: no edema Psych: pleasant, normal affect Skin: intact Neuro: Alert and oriented x3.  Musculoskeletal: Diffuse tenderness in multiple joints on both sides of the body. 5/5 strength in both upper and lower extremities with the exception of 4/5 DF/PF in left foot (prior surgery) Psych: pleasant, normal affect     Assessment & Plan:  Mrs. Week is a 62 year old woman who presents to establish care for fibromyalgia with worsening symptoms. She also has weakness and fatigue.   Fibromyalgia is a clinical syndrome characterized by widespread  pain and tenderness in addition to a variety of symptoms, including fatigue, anxiety, depression, and sleep disturbances. Fibromyalgia affects 3-5% of women and up to 25% of women with other rheumatological conditions.   Exercise is a first-line treatment for the disease, and can help with both the physical and emotional symptoms, as well as improving overall health and function.   -referred to aquatherapy -continue percocet 5mg  BID  -Provided with a pain relief journal and discussed that it contains foods and lifestyle tips to naturally help to improve pain. Discussed that these lifestyle strategies are also very good for health unlike some medications which can have negative side effects. Discussed that the act of keeping a journal can be therapeutic and helpful to realize patterns what helps to trigger and alleviate pain.   -discussed that stress can worsen fibromyalgia pain -discussed that rest helps -discussed that heat promotes good blood flow -She tends to have worst symptoms in the winter, the cold worsens her symptoms -Commended her idea to bring her a heating pad at work.  -Pain is currently worse in her lower back and bilateral lower extremities.  -She is motivated to continue working and  works 40 hours per week -She has failed all neuropathic medications. Discussed side effects of Savella and she chose not to try this. Discussed side effects of tylenol with codeine and she prefers this option. Advised to use for only severe pain.  -Pain contract and UDS signed previously -discussed topamax but she defers -Discussed current symptoms of pain and history of pain.  -Discussed benefits of exercise in reducing pain. -Discussed following foods that may reduce pain: 1) Ginger (especially studied for arthritis)- reduce leukotriene production to decrease inflammation 2) Blueberries- high in phytonutrients that decrease inflammation 3) Salmon- marine omega-3s reduce joint swelling and pain 4)  Pumpkin seeds- reduce inflammation 5) dark chocolate- reduces inflammation 6) turmeric- reduces inflammation 7) tart cherries - reduce pain and stiffness 8) extra virgin olive oil - its compound olecanthal helps to block prostaglandins  9) chili peppers- can be eaten or applied topically via capsaicin 10) mint- helpful for headache, muscle aches, joint pain, and itching 11) garlic- reduces inflammation  Link to further information on diet for chronic pain: http://www.randall.com/   Obesity (BMI 34.26), 175 lbs.  -Our goal is to increase her exercise, improve her nutrition.  -Provided exercise and dietary counseling.   -Provided referral for aquatic therapy.   -Meloxicam did not help.  Myofascial pain syndrome -Provided script for Lidocaine 5% patch. Can consider trigger point injections for myofascial pain in future with lidocaine and saline  -She walks a lot as part of her work  -discussed trigger point injections but she defers  -She does not have much appetite. She has been trying to drink more water. She likes salmon and seafood. She loves stewed chicken.    Her goal is to be more active so she can spend more time with her grandchildren.   All questions answered. RTC in 1 month for f/I   Muscle cramps -recommended magnesium supplement at night.   Stress due to illness of family member: -discussed her father's illness and how she wants to help him

## 2022-08-28 ENCOUNTER — Encounter: Payer: Self-pay | Admitting: Physical Medicine and Rehabilitation

## 2022-08-28 ENCOUNTER — Other Ambulatory Visit (HOSPITAL_COMMUNITY): Payer: Self-pay

## 2022-08-28 ENCOUNTER — Encounter: Payer: 59 | Attending: Physical Medicine and Rehabilitation | Admitting: Physical Medicine and Rehabilitation

## 2022-08-28 ENCOUNTER — Telehealth: Payer: Self-pay

## 2022-08-28 ENCOUNTER — Other Ambulatory Visit: Payer: Self-pay | Admitting: Internal Medicine

## 2022-08-28 VITALS — BP 127/83 | HR 95 | Ht 60.0 in | Wt 164.4 lb

## 2022-08-28 DIAGNOSIS — M797 Fibromyalgia: Secondary | ICD-10-CM | POA: Diagnosis not present

## 2022-08-28 DIAGNOSIS — Z6379 Other stressful life events affecting family and household: Secondary | ICD-10-CM

## 2022-08-28 MED ORDER — OXYCODONE-ACETAMINOPHEN 5-325 MG PO TABS
1.0000 | ORAL_TABLET | Freq: Two times a day (BID) | ORAL | 0 refills | Status: DC | PRN
  Filled 2022-08-28: qty 60, 30d supply, fill #0

## 2022-08-28 MED ORDER — VITAMIN D (ERGOCALCIFEROL) 1.25 MG (50000 UNIT) PO CAPS
50000.0000 [IU] | ORAL_CAPSULE | ORAL | 0 refills | Status: DC
  Filled 2022-08-28: qty 4, 28d supply, fill #0

## 2022-08-28 NOTE — Addendum Note (Signed)
Addended by: Izora Ribas on: 08/28/2022 10:05 AM   Modules accepted: Orders

## 2022-08-28 NOTE — Telephone Encounter (Signed)
Pa for pt ( TRULICITY ) came through on cover my meds was submitted with last office notes and labs .Marland KitchenMarland Kitchen

## 2022-08-31 ENCOUNTER — Other Ambulatory Visit (HOSPITAL_COMMUNITY): Payer: Self-pay

## 2022-08-31 ENCOUNTER — Ambulatory Visit: Payer: 59 | Attending: Internal Medicine

## 2022-08-31 NOTE — Telephone Encounter (Signed)
Decision:Approved We're pleased to let you know that we've approved your or your doctors request for coverage for Trulicity. You can now fill your prescription and it will be covered according to your plan.  As long as you remain covered by your prescription drug plan and there are no charges to your plan benefits, this request is approved from 08/28/2022-08/28/2023. When this approval expires, please speak to your doctor about your treatment.  Approval has been faxed to the pharmacy.

## 2022-09-01 ENCOUNTER — Other Ambulatory Visit (HOSPITAL_COMMUNITY): Payer: Self-pay

## 2022-09-02 ENCOUNTER — Other Ambulatory Visit (HOSPITAL_COMMUNITY): Payer: Self-pay

## 2022-09-02 MED ORDER — TIZANIDINE HCL 4 MG PO TABS
4.0000 mg | ORAL_TABLET | Freq: Every day | ORAL | 0 refills | Status: DC
  Filled 2022-09-02: qty 30, 30d supply, fill #0

## 2022-09-02 MED ORDER — MELOXICAM 15 MG PO TABS
15.0000 mg | ORAL_TABLET | Freq: Every day | ORAL | 0 refills | Status: DC
  Filled 2022-09-02: qty 30, 30d supply, fill #0

## 2022-09-02 MED ORDER — CYCLOBENZAPRINE HCL 10 MG PO TABS
10.0000 mg | ORAL_TABLET | Freq: Two times a day (BID) | ORAL | 0 refills | Status: DC | PRN
  Filled 2022-09-02: qty 40, 20d supply, fill #0

## 2022-09-16 LAB — HM DIABETES EYE EXAM

## 2022-09-21 ENCOUNTER — Encounter: Payer: Self-pay | Admitting: Dietician

## 2022-09-25 ENCOUNTER — Ambulatory Visit (INDEPENDENT_AMBULATORY_CARE_PROVIDER_SITE_OTHER): Payer: 59

## 2022-09-25 ENCOUNTER — Encounter: Payer: Self-pay | Admitting: Podiatry

## 2022-09-25 ENCOUNTER — Ambulatory Visit (INDEPENDENT_AMBULATORY_CARE_PROVIDER_SITE_OTHER): Payer: 59 | Admitting: Podiatry

## 2022-09-25 DIAGNOSIS — M7752 Other enthesopathy of left foot: Secondary | ICD-10-CM | POA: Diagnosis not present

## 2022-09-25 DIAGNOSIS — M778 Other enthesopathies, not elsewhere classified: Secondary | ICD-10-CM

## 2022-09-25 DIAGNOSIS — M159 Polyosteoarthritis, unspecified: Secondary | ICD-10-CM

## 2022-09-25 MED ORDER — TRIAMCINOLONE ACETONIDE 10 MG/ML IJ SUSP
10.0000 mg | Freq: Once | INTRAMUSCULAR | Status: AC
  Administered 2022-09-25: 10 mg

## 2022-09-26 NOTE — Progress Notes (Signed)
Subjective:   Patient ID: Sherry [name], female   DOB: 62 y.o.   MRN: [id number]   HPI Patient presents stating that she has a brace for her left foot and ankle but she is not able to wear it well due to shoe gear that does not fit properly and also has started to develop a lot of pain in her ankle again over the last few weeks   ROS      Objective:  Physical Exam  Neurovascular status intact with significant diminishment of motion of the left subtalar joint left with ankle motion that is normal with exquisite discomfort within the sinus tarsi lateral ankle gutter.  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  Today I went ahead did sinus tarsi injection left 3 mg Kenalog 5 mg Xylocaine and into the lateral ankle gutter and then she is good to go to shoe market to try to get better fitting shoes to work with the AFO bracing.  Patient will be seen back ultimately may require subtalar joint fusion  X-rays indicate that there is multiple signs subtalar joint fusion left of the middle posterior facet

## 2022-09-29 ENCOUNTER — Other Ambulatory Visit: Payer: Self-pay

## 2022-09-29 ENCOUNTER — Encounter: Payer: 59 | Admitting: Physical Medicine and Rehabilitation

## 2022-09-29 ENCOUNTER — Telehealth: Payer: Self-pay | Admitting: Physical Medicine and Rehabilitation

## 2022-09-29 ENCOUNTER — Other Ambulatory Visit (HOSPITAL_COMMUNITY): Payer: Self-pay

## 2022-09-29 ENCOUNTER — Other Ambulatory Visit: Payer: Self-pay | Admitting: Physical Medicine and Rehabilitation

## 2022-09-29 ENCOUNTER — Other Ambulatory Visit: Payer: Self-pay | Admitting: Internal Medicine

## 2022-09-29 DIAGNOSIS — E11649 Type 2 diabetes mellitus with hypoglycemia without coma: Secondary | ICD-10-CM

## 2022-09-29 DIAGNOSIS — M797 Fibromyalgia: Secondary | ICD-10-CM

## 2022-09-29 MED ORDER — OXYCODONE-ACETAMINOPHEN 5-325 MG PO TABS
1.0000 | ORAL_TABLET | Freq: Two times a day (BID) | ORAL | 0 refills | Status: DC | PRN
  Filled 2022-09-29 – 2022-10-14 (×2): qty 60, 30d supply, fill #0

## 2022-09-29 NOTE — Telephone Encounter (Signed)
We rescheduled patient for next month with Dr. Dub Mikes said it is hard for her to get off work, she works at [employer].  She did have an appointment today but we rescheduled because Dr. Carlis Abbott wasn't feeling well.  Patient will be out of medication.  Can her medication be sent in?  Please call patient.

## 2022-10-01 ENCOUNTER — Other Ambulatory Visit (HOSPITAL_COMMUNITY): Payer: Self-pay

## 2022-10-01 MED ORDER — DICLOFENAC SODIUM 1 % EX GEL
2.0000 g | Freq: Four times a day (QID) | CUTANEOUS | 0 refills | Status: DC
Start: 2022-10-01 — End: 2022-10-19
  Filled 2022-10-01 – 2022-10-14 (×2): qty 100, 12d supply, fill #0

## 2022-10-01 MED ORDER — PROMETHAZINE HCL 12.5 MG PO TABS
12.5000 mg | ORAL_TABLET | Freq: Three times a day (TID) | ORAL | 0 refills | Status: DC | PRN
Start: 2022-10-01 — End: 2022-10-19
  Filled 2022-10-01 – 2022-10-14 (×2): qty 20, 7d supply, fill #0

## 2022-10-02 ENCOUNTER — Other Ambulatory Visit (HOSPITAL_COMMUNITY): Payer: Self-pay

## 2022-10-05 ENCOUNTER — Other Ambulatory Visit (HOSPITAL_COMMUNITY): Payer: Self-pay

## 2022-10-12 ENCOUNTER — Other Ambulatory Visit (HOSPITAL_COMMUNITY): Payer: Self-pay

## 2022-10-14 ENCOUNTER — Other Ambulatory Visit: Payer: Self-pay | Admitting: Internal Medicine

## 2022-10-14 ENCOUNTER — Other Ambulatory Visit (HOSPITAL_COMMUNITY): Payer: Self-pay

## 2022-10-14 DIAGNOSIS — M797 Fibromyalgia: Secondary | ICD-10-CM

## 2022-10-16 ENCOUNTER — Ambulatory Visit
Admission: EM | Admit: 2022-10-16 | Discharge: 2022-10-16 | Disposition: A | Discharge status: Home / Self Care | Payer: 59 | Attending: Urgent Care | Admitting: Urgent Care

## 2022-10-16 ENCOUNTER — Other Ambulatory Visit (HOSPITAL_COMMUNITY): Payer: Self-pay

## 2022-10-16 DIAGNOSIS — E119 Type 2 diabetes mellitus without complications: Secondary | ICD-10-CM | POA: Diagnosis not present

## 2022-10-16 DIAGNOSIS — Z7984 Long term (current) use of oral hypoglycemic drugs: Secondary | ICD-10-CM

## 2022-10-16 DIAGNOSIS — U071 COVID-19: Secondary | ICD-10-CM | POA: Diagnosis not present

## 2022-10-16 MED ORDER — PROMETHAZINE-DM 6.25-15 MG/5ML PO SYRP
5.0000 mL | ORAL_SOLUTION | Freq: Three times a day (TID) | ORAL | 0 refills | Status: DC | PRN
  Filled 2022-10-16: qty 200, 14d supply, fill #0

## 2022-10-16 MED ORDER — PAXLOVID (300/100) 20 X 150 MG & 10 X 100MG PO TBPK
ORAL_TABLET | ORAL | 0 refills | Status: DC
  Filled 2022-10-16: qty 30, 5d supply, fill #0

## 2022-10-16 NOTE — ED Provider Notes (Signed)
[name]  Note:  This document was prepared using Conservation officer, historic buildings and may include unintentional dictation errors.  MRN: [id number] DOB: [birth year]  Subjective:   Sherry [name] is a 62 y.o. female presenting for 3 day history of cough, fever, body aches, malaise and fatigue.  Took a COVID test this morning and was positive.  No history of asthma or respiratory disorders.  However she does have type 2 diabetes treated without insulin, hypertension, fibromyalgia. No smoking of any kind including cigarettes, cigars, vaping, marijuana use.    No current facility-administered medications for this encounter.  Current Outpatient Medications:    aspirin EC 81 MG tablet, Take 81 mg by mouth daily. Swallow whole., Disp: , Rfl:    aspirin-acetaminophen-caffeine (EXCEDRIN MIGRAINE) 250-250-65 MG tablet, Take 2 tablets by mouth every 6 (six) hours as needed for headache., Disp: , Rfl:    cyclobenzaprine (FLEXERIL) 10 MG tablet, Take 1 tablet (10 mg total) by mouth 2 (two) times daily as needed., Disp: 40 tablet, Rfl: 0   diclofenac Sodium (VOLTAREN) 1 % GEL, Apply 2 grams topically 4 (four) times daily., Disp: 100 g, Rfl: 0   diclofenac Sodium (VOLTAREN) 1 % GEL, Apply 2 grams topically 4 (four) times daily., Disp: 100 g, Rfl: 0   docusate sodium (COLACE) 100 MG capsule, Take 1 capsule (100 mg total) by mouth every 12 (twelve) hours., Disp: 60 capsule, Rfl: 0   Dulaglutide (TRULICITY) 0.75 MG/0.5ML SOPN, Inject 0.75 mg into the skin once a week., Disp: 2 mL, Rfl: 1   ESOMEPRAZOLE MAGNESIUM PO, Take by mouth. (Patient not taking: Reported on 07/28/2022), Disp: , Rfl:    Ferrous Sulfate (IRON PO), Take by mouth.  (Patient not taking: Reported on 07/28/2022), Disp: , Rfl:    glucose blood (ONETOUCH VERIO) test strip, Use as instructed to test blood sugar twice daily., Disp: 100 each, Rfl: 12   Lancets (ONETOUCH DELICA PLUS LANCET33G) MISC, USE 1 TO CHECK BLOOD  GLUCOSE BEFORE AND 2 HOURS AFTER A MEAL TO SEE HOW FOOD AFFECTS BLOOD SUGAR ONCE DAILY. (Patient not taking: Reported on 07/28/2022), Disp: 100 each, Rfl: 5   Lancets MISC, USE 1 TO CHECK BLOOD GLUCOSE BEFORE AND 2 HOURS AFTER A MEAL TO SEE HOW FOOD AFFECTS BLOOD SUGAR ONCE DAILY. (Patient not taking: Reported on 07/28/2022), Disp: , Rfl:    lisinopril-hydrochlorothiazide (ZESTORETIC) 20-25 MG tablet, Take 1 tablet by mouth daily., Disp: 90 tablet, Rfl: 0   meloxicam (MOBIC) 15 MG tablet, Take 1 tablet (15 mg total) by mouth daily., Disp: 30 tablet, Rfl: 0   oxyCODONE-acetaminophen (PERCOCET) 5-325 MG tablet, Take 1 tablet by mouth 2 (two) times daily as needed for severe pain., Disp: 60 tablet, Rfl: 0   pantoprazole (PROTONIX) 40 MG tablet, Take 1 tablet (40 mg total) by mouth 2 (two) times daily., Disp: 120 tablet, Rfl: 2   polyethylene glycol powder (MIRALAX) 17 GM/SCOOP powder, Take 1 capful (17 g total) and mix in 8 oz of clear liquid and drink by mouth daily as needed for moderate constipation or severe constipation., Disp: 510 g, Rfl: 0   promethazine (PHENERGAN) 12.5 MG tablet, Take 1 tablet (12.5 mg total) by mouth every 8 (eight) hours as needed for nausea or vomiting., Disp: 20 tablet, Rfl: 0   simvastatin (ZOCOR) 40 MG tablet, Take 1 tablet (40 mg total) by mouth at bedtime., Disp: 90 tablet, Rfl: 1   tiZANidine (ZANAFLEX) 4 MG tablet, Take 1 tablet (4  mg total) by mouth at bedtime., Disp: 30 tablet, Rfl: 0   Vitamin D, Ergocalciferol, (DRISDOL) 1.25 MG (50000 UNIT) CAPS capsule, Take 1 capsule (50,000 Units total) by mouth every 7 (seven) days., Disp: 7 capsule, Rfl: 0   Allergies  Allergen Reactions   Duloxetine Swelling   Duloxetine Hcl Other (See Comments)    Nausea, vomiting   Pregabalin Nausea And Vomiting and Swelling   Gabapentin Other (See Comments)    Weight gain    Past Medical History:  Diagnosis Date   Acid reflux disease    Anginal pain (HCC)    last time 04/18/13 in  afternoon; referred to Dr. Dietrich Pates   Anxiety    Cancer [facility])    uterine- had hysterectomy   Chest pain 04/07/2011   Chronic pain    Chronic pain 06/19/2014   Cough 09/25/2011   Depression    Fast heart beat    Fibromyalgia    Headache(784.0) 12/27/2012   History of blood transfusion 1981   childbirth   History of kidney stones    passed   Hypertension    Influenza A 07/08/2017   Left wrist pain 03/10/2013   Migraine    last one was 04/18/13   Sleep apnea    does not use CPAP   Trichomonas contact, treated      Past Surgical History:  Procedure Laterality Date   CHOLECYSTECTOMY     2008   ORIF CALCANEOUS FRACTURE Left 05/16/2013   Procedure: OPEN REDUCTION INTERNAL FIXATION (ORIF) LEFT CALCANEOUS FRACTURE;  Surgeon: Budd Palmer, MD;  Location: [facility] OR;  Service: Orthopedics;  Laterality: Left;   TOTAL ABDOMINAL HYSTERECTOMY     1986   WISDOM TOOTH EXTRACTION      Family History  Problem Relation Age of Onset   Diabetes Mother    Hypertension Mother    Depression Mother    Diabetes Sister    Hypertension Father    Cancer Father        Prostate   Alcohol abuse Father    Cancer Brother        Prostate   Breast cancer Neg Hx    Colon cancer Neg Hx    Colon polyps Neg Hx    Esophageal cancer Neg Hx    Stomach cancer Neg Hx    Rectal cancer Neg Hx     Social History   Tobacco Use   Smoking status: Never   Smokeless tobacco: Never  Vaping Use   Vaping Use: Never used  Substance Use Topics   Alcohol use: No   Drug use: No    ROS   Objective:   Vitals: BP 117/81 (BP Location: Left Arm)   Pulse 99   Temp 98.9 F (37.2 C)   Resp 18   SpO2 94%   Physical Exam Constitutional:      General: She is not in acute distress.    Appearance: Normal appearance. She is well-developed. She is not ill-appearing, toxic-appearing or diaphoretic.  HENT:     Head: Normocephalic and atraumatic.     Right Ear: External ear normal.     Left Ear: External ear  normal.     Nose: Congestion present. No rhinorrhea.     Mouth/Throat:     Mouth: Mucous membranes are moist.  Eyes:     General: No scleral icterus.       Right eye: No discharge.        Left eye: No discharge.  Extraocular Movements: Extraocular movements intact.  Cardiovascular:     Rate and Rhythm: Normal rate and regular rhythm.     Heart sounds: Normal heart sounds. No murmur heard.    No friction rub. No gallop.  Pulmonary:     Effort: Pulmonary effort is normal. No respiratory distress.     Breath sounds: No stridor. No wheezing, rhonchi or rales.  Chest:     Chest wall: No tenderness.  Skin:    General: Skin is warm and dry.  Neurological:     General: No focal deficit present.     Mental Status: She is alert and oriented to person, place, and time.  Psychiatric:        Mood and Affect: Mood normal.        Behavior: Behavior normal.     Assessment and Plan :   PDMP not reviewed this encounter.  1. Clinical diagnosis of COVID-19   2. Type 2 diabetes mellitus treated without insulin (HCC)    COVID testing pending, she has a positive test and with her respiratory symptoms recommended starting Paxlovid.  No history of CKD.  Last GFR was greater than 82mL/min.  Avoid simvastatin, use only half the dose of oxycodone if she plans to use it.  Deferred imaging given clear cardiopulmonary exam, hemodynamically stable vital signs.  COVID confirmation test is pending at her request.  Counseled patient on potential for adverse effects with medications prescribed/recommended today, ER and return-to-clinic precautions discussed, patient verbalized understanding.    Wallis Bamberg, [place] 10/16/22 [phone]

## 2022-10-16 NOTE — ED Triage Notes (Signed)
Pt c/o fever, cough, HA x 3 days-reports +covid home test-NAD-steady gait

## 2022-10-16 NOTE — Discharge Instructions (Signed)
Start Paxlovid for your COVID infection. Use only half the dose of oxycodone if you are going to use this medicine while on Paxlovid. Avoid using simvastatin for the entire course of Paxlovid plus 2 days.   We will manage this as a viral syndrome. For sore throat or cough try using a honey-based tea. Use 3 teaspoons of honey with juice squeezed from half lemon. Place shaved pieces of ginger into 1/2-1 cup of water and warm over stove top. Then mix the ingredients and repeat every 4 hours as needed. Please take Tylenol 650mg  once every 6 hours for fevers, aches and pains. Hydrate very well with at least 2 liters (64 ounces) of water. Eat light meals such as soups (chicken and noodles, chicken wild rice, vegetable).  Do not eat any foods that you are allergic to.  Start an antihistamine like Zyrtec (10mg  daily) for postnasal drainage, sinus congestion.  You can take this with the cough medication as needed.

## 2022-10-17 LAB — SARS CORONAVIRUS 2 (TAT 6-24 HRS): SARS Coronavirus 2: POSITIVE — AB

## 2022-10-19 ENCOUNTER — Other Ambulatory Visit (HOSPITAL_COMMUNITY): Payer: Self-pay

## 2022-10-19 ENCOUNTER — Encounter (HOSPITAL_COMMUNITY): Payer: Self-pay | Admitting: *Deleted

## 2022-10-19 ENCOUNTER — Ambulatory Visit (HOSPITAL_COMMUNITY)
Admission: EM | Admit: 2022-10-19 | Discharge: 2022-10-19 | Disposition: A | Discharge status: Home / Self Care | Payer: 59 | Attending: Emergency Medicine | Admitting: Emergency Medicine

## 2022-10-19 ENCOUNTER — Other Ambulatory Visit: Payer: Self-pay

## 2022-10-19 DIAGNOSIS — U071 COVID-19: Secondary | ICD-10-CM

## 2022-10-19 MED ORDER — CETIRIZINE HCL 10 MG PO TABS
10.0000 mg | ORAL_TABLET | Freq: Every day | ORAL | 2 refills | Status: DC
  Filled 2022-10-19 – 2023-07-14 (×2): qty 30, 30d supply, fill #0
  Filled 2023-08-17: qty 30, 30d supply, fill #1
  Filled 2023-10-05: qty 30, 30d supply, fill #2

## 2022-10-19 NOTE — Discharge Instructions (Signed)
Continue symptomatic care You can add once daily allergy med such as zyrtec or allegra to help reduce runny nose, congestion, and drainage in the throat.  Drink lots of fluids!

## 2022-10-19 NOTE — ED Triage Notes (Signed)
Pt tested positive for COVID at home x2 . Pt has a cough ,fatigued . Pt was seen 3 days ago.

## 2022-10-19 NOTE — ED Provider Notes (Signed)
[facility]    CSN: [id number] Arrival date & time: 10/19/22  0912     History   Chief Complaint Chief Complaint  Patient presents with   Covid Positive    HPI Sherry [name] is a 62 y.o. female.  Here for return to work note Symptoms started 5/8.  She was seen 5/10 and diagnosed with COVID. Has been using symptomatic care. Paxlovid was sent to pharmacy but too expensive. She would like to return to work tomorrow. Today is day 5 of symptoms. She has not had a fever in over 48 hours  Past Medical History:  Diagnosis Date   Acid reflux disease    Anginal pain (HCC)    last time 04/18/13 in afternoon; referred to Dr. Dietrich Pates   Anxiety    Cancer [facility])    uterine- had hysterectomy   Chest pain 04/07/2011   Chronic pain    Chronic pain 06/19/2014   Cough 09/25/2011   Depression    Fast heart beat    Fibromyalgia    Headache(784.0) 12/27/2012   History of blood transfusion 1981   childbirth   History of kidney stones    passed   Hypertension    Influenza A 07/08/2017   Left wrist pain 03/10/2013   Migraine    last one was 04/18/13   Sleep apnea    does not use CPAP   Trichomonas contact, treated     Patient Active Problem List   Diagnosis Date Noted   Mixed hyperlipidemia 03/05/2022   Uncontrolled type 2 diabetes mellitus with hyperglycemia (HCC) 03/05/2022   Osteoarthritis of multiple joints 08/25/2021   Multiple thyroid nodules 08/20/2020   Type 2 diabetes mellitus (HCC) 04/22/2020   Healthcare maintenance 01/26/2019   Insomnia 12/29/2018   Solitary pulmonary nodule 08/03/2018   Depression 05/09/2018   Sleep apnea 05/18/2013   Lumbar facet arthropathy 03/10/2013   Metabolic syndrome 09/10/2011   Obesity 09/02/2011   Atypical chest pain 04/07/2011   Fibromyalgia 03/24/2011   Anxiety 03/24/2011   Essential hypertension 03/24/2011   GERD 01/05/2007    Past Surgical History:  Procedure Laterality Date   CHOLECYSTECTOMY     2008   ORIF  CALCANEOUS FRACTURE Left 05/16/2013   Procedure: OPEN REDUCTION INTERNAL FIXATION (ORIF) LEFT CALCANEOUS FRACTURE;  Surgeon: Budd Palmer, MD;  Location: [facility] OR;  Service: Orthopedics;  Laterality: Left;   TOTAL ABDOMINAL HYSTERECTOMY     1986   WISDOM TOOTH EXTRACTION      OB History     Gravida  3   Para  2   Term  2   Preterm      AB  1   Living  2      SAB  1   IAB      Ectopic      Multiple      Live Births  2            Home Medications    Prior to Admission medications   Medication Sig Start Date End Date Taking? Authorizing Provider  cetirizine (ZYRTEC ALLERGY) 10 MG tablet Take 1 tablet (10 mg total) by mouth daily. 10/19/22  Yes Rhodie Cienfuegos, Ray Church  aspirin EC 81 MG tablet Take 81 mg by mouth daily. Swallow whole.    [provider]  aspirin-acetaminophen-caffeine (EXCEDRIN MIGRAINE) [phone] MG tablet Take 2 tablets by mouth every 6 (six) hours as needed for headache.    [provider]  cyclobenzaprine (FLEXERIL) 10  MG tablet Take 1 tablet (10 mg total) by mouth 2 (two) times daily as needed. 09/02/22   Adron Bene, MD  docusate sodium (COLACE) 100 MG capsule Take 1 capsule (100 mg total) by mouth every 12 (twelve) hours. 07/24/22   Champ Mungo, DO  Dulaglutide (TRULICITY) 0.75 MG/0.5ML SOPN Inject 0.75 mg into the skin once a week. 08/03/22   Nooruddin, Jason Fila, MD  glucose blood (ONETOUCH VERIO) test strip Use as instructed to test blood sugar twice daily. 08/03/22   Nooruddin, Jason Fila, MD  lisinopril-hydrochlorothiazide (ZESTORETIC) 20-25 MG tablet Take 1 tablet by mouth daily. 07/24/22   Champ Mungo, DO  meloxicam (MOBIC) 15 MG tablet Take 1 tablet (15 mg total) by mouth daily. 09/02/22   Adron Bene, MD  oxyCODONE-acetaminophen (PERCOCET) 5-325 MG tablet Take 1 tablet by mouth 2 (two) times daily as needed for severe pain. 09/29/22   Raulkar, Drema Pry, MD  pantoprazole (PROTONIX) 40 MG tablet Take 1 tablet (40 mg total) by mouth  2 (two) times daily. 07/24/22   Champ Mungo, DO  polyethylene glycol powder (MIRALAX) 17 GM/SCOOP powder Take 1 capful (17 g total) and mix in 8 oz of clear liquid and drink by mouth daily as needed for moderate constipation or severe constipation. 07/24/22   Champ Mungo, DO  promethazine-dextromethorphan (PROMETHAZINE-DM) 6.25-15 MG/5ML syrup Take 5 mLs by mouth 3 (three) times daily as needed for cough. 10/16/22   Wallis Bamberg, PA-C  simvastatin (ZOCOR) 40 MG tablet Take 1 tablet (40 mg total) by mouth at bedtime. 07/24/22   Champ Mungo, DO  tiZANidine (ZANAFLEX) 4 MG tablet Take 1 tablet (4 mg total) by mouth at bedtime. 09/02/22   Adron Bene, MD  Vitamin D, Ergocalciferol, (DRISDOL) 1.25 MG (50000 UNIT) CAPS capsule Take 1 capsule (50,000 Units total) by mouth every 7 (seven) days. 08/28/22   Raulkar, Drema Pry, MD    Family History Family History  Problem Relation Age of Onset   Diabetes Mother    Hypertension Mother    Depression Mother    Diabetes Sister    Hypertension Father    Cancer Father        Prostate   Alcohol abuse Father    Cancer Brother        Prostate   Breast cancer Neg Hx    Colon cancer Neg Hx    Colon polyps Neg Hx    Esophageal cancer Neg Hx    Stomach cancer Neg Hx    Rectal cancer Neg Hx     Social History Social History   Tobacco Use   Smoking status: Never   Smokeless tobacco: Never  Vaping Use   Vaping Use: Never used  Substance Use Topics   Alcohol use: No   Drug use: No     Allergies   Duloxetine, Duloxetine hcl, Pregabalin, and Gabapentin   Review of Systems Review of Systems As per HPI  Physical Exam Triage Vital Signs ED Triage Vitals  Enc Vitals Group     BP 10/19/22 1009 (!) 135/90     Pulse Rate 10/19/22 1009 96     Resp 10/19/22 1009 18     Temp 10/19/22 1009 99.1 F (37.3 C)     Temp src --      SpO2 10/19/22 1009 100 %     Weight --      Height --      Head Circumference --      Peak Flow --  Pain Score  10/19/22 1007 0     Pain Loc --      Pain Edu? --      Excl. in GC? --    No data found.  Updated Vital Signs BP (!) 135/90   Pulse 96   Temp 99.1 F (37.3 C)   Resp 18   SpO2 100%    Physical Exam Vitals and nursing note reviewed.  Constitutional:      General: She is not in acute distress. HENT:     Nose: No rhinorrhea.     Mouth/Throat:     Mouth: Mucous membranes are moist.     Pharynx: Oropharynx is clear. No posterior oropharyngeal erythema.  Eyes:     Conjunctiva/sclera: Conjunctivae normal.  Cardiovascular:     Rate and Rhythm: Normal rate and regular rhythm.     Pulses: Normal pulses.     Heart sounds: Normal heart sounds.  Pulmonary:     Effort: Pulmonary effort is normal.     Breath sounds: Normal breath sounds.  Musculoskeletal:     Cervical back: Normal range of motion.  Lymphadenopathy:     Cervical: No cervical adenopathy.  Skin:    General: Skin is warm and dry.  Neurological:     Mental Status: She is alert and oriented to person, place, and time.     UC Treatments / Results  Labs (all labs ordered are listed, but only abnormal results are displayed) Labs Reviewed - No data to display  EKG   Radiology No results found.  Procedures Procedures (including critical care time)  Medications Ordered in UC Medications - No data to display  Initial Impression / Assessment and Plan / UC Course  I have reviewed the triage vital signs and the nursing notes.  Pertinent labs & imaging results that were available during my care of the patient were reviewed by me and considered in my medical decision making (see chart for details).  Afebrile, well appearing, clear lungs Will continue symptomatic care at home.  Work note provided. Recommend to wear mask for at least 5 days Return precautions discussed. Patient agrees to plan  Final Clinical Impressions(s) / UC Diagnoses   Final diagnoses:  Lab test positive for detection of COVID-19 virus      Discharge Instructions      Continue symptomatic care You can add once daily allergy med such as zyrtec or allegra to help reduce runny nose, congestion, and drainage in the throat.  Drink lots of fluids!    ED Prescriptions     Medication Sig Dispense Auth. Provider   cetirizine (ZYRTEC ALLERGY) 10 MG tablet Take 1 tablet (10 mg total) by mouth daily. 30 tablet Karmin Kasprzak, Lurena Joiner, PA-C      PDMP not reviewed this encounter.   Marlow Baars, [place] 10/19/22 1133

## 2022-10-20 ENCOUNTER — Telehealth (HOSPITAL_COMMUNITY): Payer: Self-pay | Admitting: *Deleted

## 2022-10-20 NOTE — Telephone Encounter (Signed)
Pt called for an extended note to go back to work on 10/21/2022. I have done the note and LMOM to advise pt she can create a Mychart to get the letter or she can come pick up letter at front desk.

## 2022-10-26 ENCOUNTER — Ambulatory Visit (INDEPENDENT_AMBULATORY_CARE_PROVIDER_SITE_OTHER): Payer: 59 | Admitting: Student

## 2022-10-26 ENCOUNTER — Encounter: Payer: Self-pay | Admitting: Student

## 2022-10-26 ENCOUNTER — Encounter: Payer: 59 | Attending: Physical Medicine and Rehabilitation | Admitting: Physical Medicine and Rehabilitation

## 2022-10-26 ENCOUNTER — Other Ambulatory Visit (HOSPITAL_COMMUNITY): Payer: Self-pay

## 2022-10-26 VITALS — BP 143/87 | HR 84 | Temp 98.2°F | Ht 60.0 in | Wt 161.4 lb

## 2022-10-26 VITALS — BP 143/85 | HR 93 | Ht 60.0 in | Wt 160.0 lb

## 2022-10-26 DIAGNOSIS — M797 Fibromyalgia: Secondary | ICD-10-CM | POA: Diagnosis not present

## 2022-10-26 DIAGNOSIS — M7918 Myalgia, other site: Secondary | ICD-10-CM

## 2022-10-26 DIAGNOSIS — Z5181 Encounter for therapeutic drug level monitoring: Secondary | ICD-10-CM

## 2022-10-26 DIAGNOSIS — I10 Essential (primary) hypertension: Secondary | ICD-10-CM | POA: Diagnosis not present

## 2022-10-26 DIAGNOSIS — E119 Type 2 diabetes mellitus without complications: Secondary | ICD-10-CM

## 2022-10-26 DIAGNOSIS — E11649 Type 2 diabetes mellitus with hypoglycemia without coma: Secondary | ICD-10-CM

## 2022-10-26 DIAGNOSIS — R0789 Other chest pain: Secondary | ICD-10-CM | POA: Diagnosis not present

## 2022-10-26 DIAGNOSIS — R5383 Other fatigue: Secondary | ICD-10-CM | POA: Diagnosis not present

## 2022-10-26 DIAGNOSIS — Z79899 Other long term (current) drug therapy: Secondary | ICD-10-CM | POA: Diagnosis not present

## 2022-10-26 DIAGNOSIS — G894 Chronic pain syndrome: Secondary | ICD-10-CM | POA: Diagnosis not present

## 2022-10-26 LAB — POCT GLYCOSYLATED HEMOGLOBIN (HGB A1C): Hemoglobin A1C: 7.3 % — AB (ref 4.0–5.6)

## 2022-10-26 LAB — GLUCOSE, CAPILLARY: Glucose-Capillary: 100 mg/dL — ABNORMAL HIGH (ref 70–99)

## 2022-10-26 MED ORDER — CYCLOBENZAPRINE HCL 10 MG PO TABS
10.00 mg | ORAL_TABLET | Freq: Two times a day (BID) | ORAL | 0 refills | Status: DC | PRN
Start: 2022-10-26 — End: 2022-12-11
  Filled 2022-10-26 – 2022-11-16 (×2): qty 40, 20d supply, fill #0

## 2022-10-26 MED ORDER — VITAMIN D (ERGOCALCIFEROL) 1.25 MG (50000 UNIT) PO CAPS
50000.0000 [IU] | ORAL_CAPSULE | ORAL | 0 refills | Status: DC
  Filled 2022-10-26 – 2022-11-16 (×2): qty 4, 28d supply, fill #0

## 2022-10-26 MED ORDER — OXYCODONE-ACETAMINOPHEN 5-325 MG PO TABS
1.0000 | ORAL_TABLET | Freq: Two times a day (BID) | ORAL | 0 refills | Status: DC | PRN
  Filled 2022-10-26 – 2022-11-16 (×2): qty 60, 30d supply, fill #0

## 2022-10-26 MED ORDER — ONETOUCH VERIO VI STRP
ORAL_STRIP | 12 refills | Status: DC
Start: 2022-10-26 — End: 2023-04-28
  Filled 2022-10-26: qty 100, 30d supply, fill #0

## 2022-10-26 MED ORDER — LISINOPRIL-HYDROCHLOROTHIAZIDE 20-25 MG PO TABS
1.0000 | ORAL_TABLET | Freq: Every day | ORAL | 11 refills | Status: DC
Start: 2022-10-26 — End: 2023-11-25
  Filled 2022-10-26 – 2022-11-16 (×2): qty 30, 30d supply, fill #0
  Filled 2022-12-11: qty 30, 30d supply, fill #1
  Filled 2023-01-18: qty 30, 30d supply, fill #2
  Filled 2023-02-15: qty 30, 30d supply, fill #3
  Filled 2023-03-19: qty 30, 30d supply, fill #4
  Filled 2023-04-22: qty 30, 30d supply, fill #5
  Filled 2023-06-08: qty 30, 30d supply, fill #6
  Filled 2023-07-14: qty 30, 30d supply, fill #7
  Filled 2023-08-17: qty 30, 30d supply, fill #8
  Filled 2023-10-05: qty 30, 30d supply, fill #9

## 2022-10-26 MED ORDER — TRULICITY 1.5 MG/0.5ML ~~LOC~~ SOAJ
1.5000 mg | SUBCUTANEOUS | 11 refills | Status: DC
Start: 2022-10-26 — End: 2023-07-19
  Filled 2022-10-26 – 2023-07-14 (×7): qty 2, 28d supply, fill #0

## 2022-10-26 NOTE — Assessment & Plan Note (Addendum)
Patient has a past medical history of fibromyalgia.  Patient is followed by the pain clinic who manages most of this.  Patient currently is on Percocet, cyclobenzaprine, tizanidine, and meloxicam.  Patient has ran out of tizanidine and cyclobenzaprine.  She states she wants refill on her cyclobenzaprine.  Unclear on who is filling this or not.  It looks like the [facility] has been refilled this for some time now.  Patient states she is having some low back pain as a recent given the weather.  She states she takes cyclobenzaprine as needed.  She states her Percocet is doing the job.  On exam, patient has no decreased range of motion, and does not have much tenderness to her entire spine.  She denies any trauma.  Patient should not be on 2 muscle relaxers at 1 time.  She did have conversation with patient about pain medicine through pain clinic only.  Plan: -Refill cyclobenzaprine -Continue other pain control per pain clinic -Informed the patient about the [facility] and [facility] aerobics in which patient is interested

## 2022-10-26 NOTE — Assessment & Plan Note (Signed)
Blood pressure 143/87 today.  This is not at goal.  Patient states she is in some pain, and could be contributing.  Patient current regimen includes lisinopril-HCTZ 20-25 mg daily.  Plan: -Continue lisinopril-HCTZ 20-25 mg daily -Patient to keep log

## 2022-10-26 NOTE — Progress Notes (Signed)
CC: Follow-up  HPI:  [name] is a 62 y.o. female with a past medical history of hypertension, GERD, type 2 diabetes who presents for follow-up appointment.  Please see assessment and plan for full HPI.  Past Medical History:  Diagnosis Date   Acid reflux disease    Anginal pain (HCC)    last time 04/18/13 in afternoon; referred to Dr. 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   Chest pain 04/07/2011   Chronic pain    Chronic pain 06/19/2014   Cough 09/25/2011   Depression    Fast heart beat    Fibromyalgia    Headache(784.0) 12/27/2012   History of blood transfusion 1981   childbirth   History of kidney stones    passed   Hypertension    Influenza A 07/08/2017   Left wrist pain 03/10/2013   Migraine    last one was 04/18/13   Sleep apnea    does not use CPAP   Trichomonas contact, treated      Current Outpatient Medications:    Dulaglutide (TRULICITY) 1.5 MG/0.5ML SOPN, Inject 1.5 mg into the skin once a week., Disp: 2 mL, Rfl: 11   aspirin EC 81 MG tablet, Take 81 mg by mouth daily. Swallow whole., Disp: , Rfl:    aspirin-acetaminophen-caffeine (EXCEDRIN MIGRAINE) 250-250-65 MG tablet, Take 2 tablets by mouth every 6 (six) hours as needed for headache., Disp: , Rfl:    cetirizine (ZYRTEC ALLERGY) 10 MG tablet, Take 1 tablet (10 mg total) by mouth daily., Disp: 30 tablet, Rfl: 2   cyclobenzaprine (FLEXERIL) 10 MG tablet, Take 1 tablet (10 mg total) by mouth 2 (two) times daily as needed., Disp: 40 tablet, Rfl: 0   docusate sodium (COLACE) 100 MG capsule, Take 1 capsule (100 mg total) by mouth every 12 (twelve) hours., Disp: 60 capsule, Rfl: 0   glucose blood (ONETOUCH VERIO) test strip, Use as instructed to test blood sugar twice daily., Disp: 100 each, Rfl: 12   lisinopril-hydrochlorothiazide (ZESTORETIC) 20-25 MG tablet, Take 1 tablet by mouth daily., Disp: 30 tablet, Rfl: 11   meloxicam (MOBIC) 15 MG tablet, Take 1 tablet (15 mg  total) by mouth daily., Disp: 30 tablet, Rfl: 0   oxyCODONE-acetaminophen (PERCOCET) 5-325 MG tablet, Take 1 tablet by mouth 2 (two) times daily as needed for severe pain., Disp: 60 tablet, Rfl: 0   pantoprazole (PROTONIX) 40 MG tablet, Take 1 tablet (40 mg total) by mouth 2 (two) times daily., Disp: 120 tablet, Rfl: 2   polyethylene glycol powder (MIRALAX) 17 GM/SCOOP powder, Take 1 capful (17 g total) and mix in 8 oz of clear liquid and drink by mouth daily as needed for moderate constipation or severe constipation., Disp: 510 g, Rfl: 0   promethazine-dextromethorphan (PROMETHAZINE-DM) 6.25-15 MG/5ML syrup, Take 5 mLs by mouth 3 (three) times daily as needed for cough., Disp: 200 mL, Rfl: 0   simvastatin (ZOCOR) 40 MG tablet, Take 1 tablet (40 mg total) by mouth at bedtime., Disp: 90 tablet, Rfl: 1   Vitamin D, Ergocalciferol, (DRISDOL) 1.25 MG (50000 UNIT) CAPS capsule, Take 1 capsule (50,000 Units total) by mouth every 7 (seven) days., Disp: 7 capsule, Rfl: 0  Review of Systems:    MSK: Patient endorses back pain  Physical Exam:  Vitals:   10/26/22 1557  BP: (!) 143/87  Pulse: 84  Temp: 98.2 F (36.8 C)  TempSrc: Oral  SpO2: 98%  Weight:  161 lb 6.4 oz (73.2 kg)  Height: 5' (1.524 m)    General: Patient is sitting comfortably in the room  Head: Normocephalic, atraumatic  Cardio: Regular rate and rhythm, no murmurs, rubs or gallops Pulmonary: Clear to ausculation bilaterally with no rales, rhonchi, and crackles  Back: No midline spinal tenderness.  Some left upper thoracic paraspinal tenderness noted.  Full range of motion with pain on sidebending, rotation, and flexion and extension.  Assessment & Plan:   Essential hypertension Blood pressure 143/87 today.  This is not at goal.  Patient states she is in some pain, and could be contributing.  Patient current regimen includes lisinopril-HCTZ 20-25 mg daily.  Plan: -Continue lisinopril-HCTZ 20-25 mg daily -Patient to keep  log  Type 2 diabetes mellitus (HCC) Patient follows for A1c follow-up.  Patient current regimen includes Trulicity 0.75 mg weekly.  A1c today down to 7.3 from 7.53 months ago.  Patient states she has been compliant with her Trulicity and has tried to make lifestyle changes.  Patient wants a refill on her test strips.  Plan: -Increase to 1.5 mg Trulicity weekly -Continue lifestyle modification -Follow-up in 3 months for A1c check  Fibromyalgia Patient has a past medical history of fibromyalgia.  Patient is followed by the pain clinic who manages most of this.  Patient currently is on Percocet, cyclobenzaprine, tizanidine, and meloxicam.  Patient has ran out of tizanidine and cyclobenzaprine.  She states she wants refill on her cyclobenzaprine.  Unclear on who is filling this or not.  It looks like the [facility] has been refilled this for some time now.  Patient states she is having some low back pain as a recent given the weather.  She states she takes cyclobenzaprine as needed.  She states her Percocet is doing the job.  On exam, patient has no decreased range of motion, and does not have much tenderness to her entire spine.  She denies any trauma.  Patient should not be on 2 muscle relaxers at 1 time.  She did have conversation with patient about pain medicine through pain clinic only.  Plan: -Refill cyclobenzaprine -Continue other pain control per pain clinic -Informed the patient about the [facility] and water aerobics in which patient is interested  Atypical chest pain Patient states this has been improving.  She never went to get her stress test.  She states she is no longer interested.  I described the risk to her, which she is willing to accept.  She states that it was stress related, and therefore her stress is improved and chest pain has improved.  She has no concerns about this today.  Plan: -No acute concerns  Patient discussed with Dr. Marquita Palms, DO PGY-1  Internal Medicine Resident  Pager: [phone]

## 2022-10-26 NOTE — Patient Instructions (Signed)
Sherry [name] you for allowing me to take part in your care today.  Here are your instructions.  1. Regarding your diabetes, your A1c was 7.3 today which is down from 7.5 about 3 months ago. We are going to go up on your Trulicity to 1.5mg  weekly.  2. Regarding your back pain we are going to give you the cyclobenzaprine. Please take only when needed and do not take everyday.   3. Please return in about 3 months for A1c check.  Thank you, Dr. Allena Katz  If you have any other questions please contact the internal medicine clinic at [phone]

## 2022-10-26 NOTE — Assessment & Plan Note (Signed)
Patient states this has been improving.  She never went to get her stress test.  She states she is no longer interested.  I described the risk to her, which she is willing to accept.  She states that it was stress related, and therefore her stress is improved and chest pain has improved.  She has no concerns about this today.  Plan: -No acute concerns

## 2022-10-26 NOTE — Assessment & Plan Note (Signed)
Patient follows for A1c follow-up.  Patient current regimen includes Trulicity 0.75 mg weekly.  A1c today down to 7.3 from 7.53 months ago.  Patient states she has been compliant with her Trulicity and has tried to make lifestyle changes.  Patient wants a refill on her test strips.  Plan: -Increase to 1.5 mg Trulicity weekly -Continue lifestyle modification -Follow-up in 3 months for A1c check

## 2022-10-26 NOTE — Progress Notes (Signed)
Subjective:    Patient ID: Sherry [name], female    DOB: 1960/12/24, 62 y.o.   MRN: [id number]  HPI  Sherry [name] is a 62 year old female who presents for f/u of her fibromyalgia.   1) Fibromyalgia -has been hurting more recently, she is not sure if it is because she is getting older -she loves life Her pain has been getting worse.  -she knows it will never go away -she works about 40 hours per week -her legs hurt very badly and her gait is impaired when the pain is severe -she has no husband -weather worsens her pain -wants to try tens unit next visit  2) Muscle cramps: -she has been taking mustard, she can't take too much because it upsets her stomach -wakes up with this -she can't take too many bananas due to their potassium  3) Stress due to illness of family member -father was diagnosed with stage 3 lung cancer -she took on a part time job so she can have extra money to go visit him -she finds her sister does not tell the truth even though the doctor told her the diagnosis -doctor does not want to operate  -he is going through chemo and radiation.   She takes Tylenol but it doesn't help.   She took Hydrocodone and Percocet before. She was taking these as needed- waiting until the pain is very severe.   Prior history: She is currently has a headache. She gets these often. She is not interested in Botox. She takes Excedrin as needed.  She also has a lot of muscle tightness.  Time helps the pain.  She has had fibromyalgia for may years. She used to follow with Dr. Riley Kill. She uses a heating pad which does provide relief. Pain medications take the edge off the pain. She is unable to tolerate family retreats in [place] because of the cold. She has been in worse pain since last visit. Ice does not help.   She takes Ibuprofen as she cannot tolerate the Tylenol. Sometimes this helps and sometimes it doesn't. She takes about 5-6 of the Ibuprofen. She takes it more when she  gets home when her pain is worse.   She wants to exercise to lose some weights. Her grandkids are 21 to 4- she has 33 grandchildren and 4 great grandkids. Weight is currently 175 lbs, BMI 32.26.   She wants to date. She wants to be young again.   She did not tolerate Lyrica or Cymbalta well. Gabapentin made her gain weight. She cannot recall her response to Amitriptyline.   Average pain is 10/10, pain right now is 7/10 and feels aching on both sides of her body in multiple joints and muscles.   Pain Inventory Average Pain 9 Pain Right Now 6 My pain is aching  In the last 24 hours, has pain interfered with the following? General activity 10 Relation with others 8 Enjoyment of life 8 What TIME of day is your pain at its worst? Morning,daytime, evening, night Sleep (in general) Fair  Pain is worse with: walking, bending, sitting, inactivity, and standing Pain improves with: rest, heat/ice, and medication Relief from Meds: 8   Family History  Problem Relation Age of Onset   Diabetes Mother    Hypertension Mother    Depression Mother    Diabetes Sister    Hypertension Father    Cancer Father        Prostate   Alcohol abuse Father  Cancer Brother        Prostate   Breast cancer Neg Hx    Colon cancer Neg Hx    Colon polyps Neg Hx    Esophageal cancer Neg Hx    Stomach cancer Neg Hx    Rectal cancer Neg Hx    Social History   Socioeconomic History   Marital status: Single    Spouse name: Not on file   Number of children: 2   Years of education: Not on file   Highest education level: 12th grade  Occupational History   Occupation: Unemployed  Tobacco Use   Smoking status: Never   Smokeless tobacco: Never  Vaping Use   Vaping Use: Never used  Substance and Sexual Activity   Alcohol use: No   Drug use: No   Sexual activity: Not on file  Other Topics Concern   Not on file  Social History Narrative   Lives with   2 Children: Ages 42 and 38   4 Grandchildren:  2, 3, 4 and 66   Brother      Diet: "Barely eats". Does not exercise due to pain.      03/2011- Currently unemployed. 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 Unsure of last pap.   Unsure of last Td   Unsure of last flu shot   Social Determinants of Health   Financial Resource Strain: Not on file  Food Insecurity: No Food Insecurity (09/11/2021)   Hunger Vital Sign    Worried About Running Out of Food in the Last Year: Never true    Ran Out of Food in the Last Year: Never true  Transportation Needs: No Transportation Needs (09/11/2021)   PRAPARE - Administrator, Civil Service (Medical): No    Lack of Transportation (Non-Medical): No  Physical Activity: Not on file  Stress: Not on file  Social Connections: Not on file   Past Surgical History:  Procedure Laterality Date   CHOLECYSTECTOMY     2008   ORIF CALCANEOUS FRACTURE Left 05/16/2013   Procedure: OPEN REDUCTION INTERNAL FIXATION (ORIF) LEFT CALCANEOUS FRACTURE;  Surgeon: Budd Palmer, MD;  Location: [facility] OR;  Service: Orthopedics;  Laterality: Left;   TOTAL ABDOMINAL HYSTERECTOMY     1986   WISDOM TOOTH EXTRACTION     Past Medical History:  Diagnosis Date   Acid reflux disease    Anginal pain (HCC)    last time 04/18/13 in afternoon; referred to Dr. 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   Chest pain 04/07/2011   Chronic pain    Chronic pain 06/19/2014   Cough 09/25/2011   Depression    Fast heart beat    Fibromyalgia    Headache(784.0) 12/27/2012   History of blood transfusion 1981   childbirth   History of kidney stones    passed   Hypertension    Influenza A 07/08/2017   Left wrist pain 03/10/2013   Migraine    last one was 04/18/13   Sleep apnea    does not use CPAP   Trichomonas contact, treated    BP (!) 147/92   Pulse 93   Ht 5' (1.524 m)   Wt 160 lb (72.6 kg)   SpO2 98%   BMI 31.25 kg/m   Opioid  Risk Score:   Fall Risk Score:  `1  Depression screen PHQ 2/9     10/26/2022    2:54 PM 08/28/2022    9:37 AM 08/03/2022    9:28 AM 07/28/2022    9:09 AM 05/12/2022    3:06 PM 03/03/2022    4:43 PM 12/04/2021    4:42 PM  Depression screen PHQ 2/9  Decreased Interest 0 0 0 0 0 0 0  Down, Depressed, Hopeless 0 0 0 0 0 0 0  PHQ - 2 Score 0 0 0 0 0 0 0  Altered sleeping     1 1 1   Tired, decreased energy     1 1 1   Change in appetite     0 0 0  Feeling bad or failure about yourself      0 0 0  Trouble concentrating     0 0 0  Moving slowly or fidgety/restless     0 0 0  Suicidal thoughts     0 0 0  PHQ-9 Score     2 2 2   Difficult doing work/chores     Not difficult at all Not difficult at all     Review of Systems  Constitutional: Negative.   HENT: Negative.    Eyes: Negative.   Respiratory: Negative.    Cardiovascular: Negative.   Gastrointestinal:  Positive for constipation and nausea.  Endocrine: Negative.   Genitourinary: Negative.   Musculoskeletal:  Positive for arthralgias, back pain, myalgias and neck pain.       Spasms   Skin: Negative.   Neurological:  Positive for headaches.  Hematological: Negative.   All other systems reviewed and are negative.      Objective:   Physical Exam Gen: no distress, normal appearing, 165 lbs, BMI 32.22 HEENT: oral mucosa pink and moist, NCAT Cardio: Reg rate Chest: normal effort, normal rate of breathing Abd: soft, non-distended Ext: no edema Psych: pleasant, normal affect Skin: intact Neuro: Alert and oriented x3.  Musculoskeletal: Diffuse tenderness in multiple joints on both sides of the body. 5/5 strength in both upper and lower extremities with the exception of 4/5 DF/PF in left foot (prior surgery) Psych: pleasant, normal affect     Assessment & Plan:  Sherry [name] is a 62 year old woman who presents to establish care for fibromyalgia with worsening symptoms. She also has weakness and fatigue.   Fibromyalgia is a  clinical syndrome characterized by widespread pain and tenderness in addition to a variety of symptoms, including fatigue, anxiety, depression, and sleep disturbances. Fibromyalgia affects 3-5% of women and up to 25% of women with other rheumatological conditions.   Exercise is a first-line treatment for the disease, and can help with both the physical and emotional symptoms, as well as improving overall health and function.   -referred to aquatherapy -recommended red light therapy -continue percocet 5mg  BID  -discussed that she is due for UDS on 5/20.  -Provided with a pain relief journal and discussed that it contains foods and lifestyle tips to naturally help to improve pain. Discussed that these lifestyle strategies are also very good for health unlike some medications which can have negative side effects. Discussed that the act of keeping a journal can be therapeutic and helpful to realize patterns what helps to trigger and alleviate pain.   -discussed that stress can worsen fibromyalgia pain -discussed that rest helps -discussed that heat promotes good blood flow -She tends to have worst symptoms in the winter, the cold worsens her symptoms -Commended her idea to bring her a heating  pad at work.  -Pain is currently worse in her lower back and bilateral lower extremities.  -She is motivated to continue working and works 40 hours per week -She has failed all neuropathic medications. Discussed side effects of Savella and she chose not to try this. Discussed side effects of tylenol with codeine and she prefers this option. Advised to use for only severe pain.  -Pain contract and UDS signed previously -discussed topamax but she defers -Discussed current symptoms of pain and history of pain.  -Discussed benefits of exercise in reducing pain. -Discussed following foods that may reduce pain: 1) Ginger (especially studied for arthritis)- reduce leukotriene production to decrease inflammation 2)  Blueberries- high in phytonutrients that decrease inflammation 3) Salmon- marine omega-3s reduce joint swelling and pain 4) Pumpkin seeds- reduce inflammation 5) dark chocolate- reduces inflammation 6) turmeric- reduces inflammation 7) tart cherries - reduce pain and stiffness 8) extra virgin olive oil - its compound olecanthal helps to block prostaglandins  9) chili peppers- can be eaten or applied topically via capsaicin 10) mint- helpful for headache, muscle aches, joint pain, and itching 11) garlic- reduces inflammation  Link to further information on diet for chronic pain: http://www.bray.com/   Obesity (BMI 34.26), 175 lbs.  -Our goal is to increase her exercise, improve her nutrition.  -Provided exercise and dietary counseling.   -Provided referral for aquatic therapy.   -Meloxicam did not help.  Myofascial pain syndrome -Provided script for Lidocaine 5% patch. Can consider trigger point injections for myofascial pain in future with lidocaine and saline  -She walks a lot as part of her work  -discussed trigger point injections but she defers  -She does not have much appetite. She has been trying to drink more water. She likes salmon and seafood. She loves stewed chicken.    Her goal is to be more active so she can spend more time with her grandchildren.   All questions answered. RTC in 1 month for f/I  Discussed tens unit  -recommended red light therapy  Muscle cramps -recommended magnesium supplement at night.   Stress due to illness of family member: -discussed her father's illness and how she wants to help him   Fatigue: -refilled vitamin D -recommended red light therapy

## 2022-10-26 NOTE — Addendum Note (Signed)
Addended by: Becky Sax on: 10/26/2022 03:28 PM   Modules accepted: Orders

## 2022-10-27 ENCOUNTER — Other Ambulatory Visit (HOSPITAL_COMMUNITY): Payer: Self-pay

## 2022-10-27 NOTE — Progress Notes (Signed)
Internal Medicine Clinic Attending  Case discussed with Dr. Patel  At the time of the visit.  We reviewed the resident's history and exam and pertinent patient test results.  I agree with the assessment, diagnosis, and plan of care documented in the resident's note.  

## 2022-10-30 LAB — DRUG TOX MONITOR 1 W/CONF, ORAL FLD
Amphetamines: NEGATIVE ng/mL (ref <10)
Barbiturates: NEGATIVE ng/mL (ref <10)
Benzodiazepines: NEGATIVE ng/mL (ref <0.50)
Buprenorphine: NEGATIVE ng/mL (ref <0.10)
Cocaine: NEGATIVE ng/mL (ref <5.0)
Codeine: NEGATIVE ng/mL (ref <2.5)
Cotinine: 6.1 ng/mL — ABNORMAL HIGH (ref <5.0)
Dihydrocodeine: NEGATIVE ng/mL (ref <2.5)
Fentanyl: NEGATIVE ng/mL (ref <0.10)
Heroin Metabolite: NEGATIVE ng/mL (ref <1.0)
Hydrocodone: NEGATIVE ng/mL (ref <2.5)
Hydromorphone: NEGATIVE ng/mL (ref <2.5)
MARIJUANA: NEGATIVE ng/mL (ref <2.5)
MDMA: NEGATIVE ng/mL (ref <10)
Meprobamate: NEGATIVE ng/mL (ref <2.5)
Methadone: NEGATIVE ng/mL (ref <5.0)
Morphine: NEGATIVE ng/mL (ref <2.5)
Nicotine Metabolite: POSITIVE ng/mL — AB (ref <5.0)
Norhydrocodone: NEGATIVE ng/mL (ref <2.5)
Noroxycodone: 5.9 ng/mL — ABNORMAL HIGH (ref <2.5)
Opiates: POSITIVE ng/mL — AB (ref <2.5)
Oxycodone: 9.4 ng/mL — ABNORMAL HIGH (ref <2.5)
Oxymorphone: NEGATIVE ng/mL (ref <2.5)
Phencyclidine: NEGATIVE ng/mL (ref <10)
Tapentadol: NEGATIVE ng/mL (ref <5.0)
Tramadol: NEGATIVE ng/mL (ref <5.0)
Zolpidem: NEGATIVE ng/mL (ref <5.0)

## 2022-10-30 LAB — DRUG TOX ALC METAB W/CON, ORAL FLD: Alcohol Metabolite: NEGATIVE ng/mL (ref <25)

## 2022-11-06 ENCOUNTER — Other Ambulatory Visit (HOSPITAL_COMMUNITY): Payer: Self-pay

## 2022-11-12 ENCOUNTER — Telehealth: Payer: Self-pay | Admitting: *Deleted

## 2022-11-12 ENCOUNTER — Other Ambulatory Visit (HOSPITAL_COMMUNITY): Payer: Self-pay

## 2022-11-12 NOTE — Telephone Encounter (Signed)
Oral swab drug screen was consistent for prescribed medications.  ?

## 2022-11-16 ENCOUNTER — Other Ambulatory Visit (HOSPITAL_COMMUNITY): Payer: Self-pay

## 2022-11-16 ENCOUNTER — Telehealth: Payer: Self-pay | Admitting: *Deleted

## 2022-11-16 ENCOUNTER — Other Ambulatory Visit: Payer: Self-pay | Admitting: Student

## 2022-11-16 ENCOUNTER — Other Ambulatory Visit: Payer: Self-pay | Admitting: Internal Medicine

## 2022-11-16 DIAGNOSIS — E11649 Type 2 diabetes mellitus with hypoglycemia without coma: Secondary | ICD-10-CM

## 2022-11-16 DIAGNOSIS — M797 Fibromyalgia: Secondary | ICD-10-CM

## 2022-11-16 NOTE — Telephone Encounter (Signed)
Call from pt stating Trulicity's co-pay is over $900.00. I called [facility] and this was confirmed - they suggested pt to call her insurance co to find out why ; this was told to the pt. Pt also requesting a refill on Promethazine and Tizanidine - not on current med list. Thanks

## 2022-11-17 ENCOUNTER — Other Ambulatory Visit (HOSPITAL_BASED_OUTPATIENT_CLINIC_OR_DEPARTMENT_OTHER): Payer: Self-pay

## 2022-11-25 ENCOUNTER — Encounter: Payer: 59 | Attending: Physical Medicine and Rehabilitation | Admitting: Registered Nurse

## 2022-11-25 ENCOUNTER — Other Ambulatory Visit (HOSPITAL_COMMUNITY): Payer: Self-pay

## 2022-11-25 VITALS — BP 127/83 | HR 85 | Ht 60.0 in | Wt 159.8 lb

## 2022-11-25 DIAGNOSIS — G894 Chronic pain syndrome: Secondary | ICD-10-CM | POA: Diagnosis not present

## 2022-11-25 DIAGNOSIS — M797 Fibromyalgia: Secondary | ICD-10-CM | POA: Diagnosis not present

## 2022-11-25 DIAGNOSIS — Z79899 Other long term (current) drug therapy: Secondary | ICD-10-CM

## 2022-11-25 DIAGNOSIS — M7918 Myalgia, other site: Secondary | ICD-10-CM | POA: Diagnosis not present

## 2022-11-25 DIAGNOSIS — Z5181 Encounter for therapeutic drug level monitoring: Secondary | ICD-10-CM | POA: Diagnosis not present

## 2022-11-25 MED ORDER — OXYCODONE-ACETAMINOPHEN 5-325 MG PO TABS
1.0000 | ORAL_TABLET | Freq: Two times a day (BID) | ORAL | 0 refills | Status: DC | PRN
  Filled 2022-11-25 – 2022-12-16 (×2): qty 60, 30d supply, fill #0

## 2022-11-25 NOTE — Progress Notes (Signed)
Subjective:    Patient ID: Sherry [name], female    DOB: February 07, 1961, 62 y.o.   MRN: [id number]  HPI: Sherry [name] is a 62 y.o. female who returns for follow up appointment for chronic pain and medication refill. She reports generalized Fibro Pain all over. She  rates her pain 8. Her current exercise regime is walking and performing stretching exercises.  Morphine equivalent is 15.00 MME.   Last Oral Swab was Performed on 10/26/2022, it was consistent.     Pain Inventory Average Pain 10 Pain Right Now 8 My pain is aching  In the last 24 hours, has pain interfered with the following? General activity 10 Relation with others 10 Enjoyment of life 10 What TIME of day is your pain at its worst? morning , daytime, evening, and night Sleep (in general) Poor  Pain is worse with: walking, bending, sitting, inactivity, and standing Pain improves with: rest, therapy/exercise, and medication Relief from Meds: 8  Family History  Problem Relation Age of Onset   Diabetes Mother    Hypertension Mother    Depression Mother    Diabetes Sister    Hypertension Father    Cancer Father        Prostate   Alcohol abuse Father    Cancer Brother        Prostate   Breast cancer Neg Hx    Colon cancer Neg Hx    Colon polyps Neg Hx    Esophageal cancer Neg Hx    Stomach cancer Neg Hx    Rectal cancer Neg Hx    Social History   Socioeconomic History   Marital status: Single    Spouse name: Not on file   Number of children: 2   Years of education: Not on file   Highest education level: 12th grade  Occupational History   Occupation: Unemployed  Tobacco Use   Smoking status: Never   Smokeless tobacco: Never  Vaping Use   Vaping Use: Never used  Substance and Sexual Activity   Alcohol use: No   Drug use: No   Sexual activity: Not on file  Other Topics Concern   Not on file  Social History Narrative   Lives with   2 Children: Ages 38 and 52   4 Grandchildren: 2, 3, 4 and  62   Brother      Diet: "Barely eats". Does not exercise due to pain.      03/2011- Currently unemployed. 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 Unsure of last pap.   Unsure of last Td   Unsure of last flu shot   Social Determinants of Health   Financial Resource Strain: Not on file  Food Insecurity: No Food Insecurity (09/11/2021)   Hunger Vital Sign    Worried About Running Out of Food in the Last Year: Never true    Ran Out of Food in the Last Year: Never true  Transportation Needs: No Transportation Needs (09/11/2021)   PRAPARE - Administrator, Civil Service (Medical): No    Lack of Transportation (Non-Medical): No  Physical Activity: Not on file  Stress: Not on file  Social Connections: Not on file   Past Surgical History:  Procedure Laterality Date   CHOLECYSTECTOMY     2008   ORIF CALCANEOUS FRACTURE Left 05/16/2013   Procedure: OPEN REDUCTION INTERNAL FIXATION (ORIF) LEFT CALCANEOUS FRACTURE;  Surgeon:  Budd Palmer, MD;  Location: [facility] OR;  Service: Orthopedics;  Laterality: Left;   TOTAL ABDOMINAL HYSTERECTOMY     1986   WISDOM TOOTH EXTRACTION     Past Surgical History:  Procedure Laterality Date   CHOLECYSTECTOMY     2008   ORIF CALCANEOUS FRACTURE Left 05/16/2013   Procedure: OPEN REDUCTION INTERNAL FIXATION (ORIF) LEFT CALCANEOUS FRACTURE;  Surgeon: Budd Palmer, MD;  Location: [facility] OR;  Service: Orthopedics;  Laterality: Left;   TOTAL ABDOMINAL HYSTERECTOMY     1986   WISDOM TOOTH EXTRACTION     Past Medical History:  Diagnosis Date   Acid reflux disease    Anginal pain (HCC)    last time 04/18/13 in afternoon; referred to Dr. 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   Chest pain 04/07/2011   Chronic pain    Chronic pain 06/19/2014   Cough 09/25/2011   Depression    Fast heart beat    Fibromyalgia    Headache(784.0) 12/27/2012   History of blood  transfusion 1981   childbirth   History of kidney stones    passed   Hypertension    Influenza A 07/08/2017   Left wrist pain 03/10/2013   Migraine    last one was 04/18/13   Sleep apnea    does not use CPAP   Trichomonas contact, treated    BP 127/83   Pulse 85   Ht 5' (1.524 m)   Wt 159 lb 12.8 oz (72.5 kg)   SpO2 99%   BMI 31.21 kg/m   Opioid Risk Score:   Fall Risk Score:  `1  Depression screen [facility] 2/9     10/26/2022    2:54 PM 08/28/2022    9:37 AM 08/03/2022    9:28 AM 07/28/2022    9:09 AM 05/12/2022    3:06 PM 03/03/2022    4:43 PM 12/04/2021    4:42 PM  Depression screen PHQ 2/9  Decreased Interest 0 0 0 0 0 0 0  Down, Depressed, Hopeless 0 0 0 0 0 0 0  PHQ - 2 Score 0 0 0 0 0 0 0  Altered sleeping     1 1 1   Tired, decreased energy     1 1 1   Change in appetite     0 0 0  Feeling bad or failure about yourself      0 0 0  Trouble concentrating     0 0 0  Moving slowly or fidgety/restless     0 0 0  Suicidal thoughts     0 0 0  PHQ-9 Score     2 2 2   Difficult doing work/chores     Not difficult at all Not difficult at all      Review of Systems  Musculoskeletal:  Positive for back pain.       Bilateral shoulder pain Bilateral leg pain  All other systems reviewed and are negative.     Objective:   Physical Exam Vitals and nursing note reviewed.  Constitutional:      Appearance: Normal appearance.  Cardiovascular:     Rate and Rhythm: Normal rate and regular rhythm.     Pulses: Normal pulses.     Heart sounds: Normal heart sounds.  Pulmonary:     Effort: Pulmonary effort is normal.     Breath sounds: Normal breath sounds.  Musculoskeletal:     Cervical back:  Normal range of motion and neck supple.     Comments: Normal Muscle Bulk and Muscle Testing Reveals:  Upper Extremities: Full ROM and Muscle Strength 5/5  Thoracic and Lumbar Hypersensitivity Lower Extremities: Full ROM and Muscle Strength 5/5 Arises from Table with ease Narrow Based Gait      Skin:    General: Skin is warm and dry.  Neurological:     Mental Status: She is alert and oriented to person, place, and time.  Psychiatric:        Mood and Affect: Mood normal.        Behavior: Behavior normal.         Assessment & Plan:  Fibromyalgia: Continue HEP as Tolerated. Continue to Monitor.  Myofascial Pain Syndrome: Continue Flexeril. Continue to Monitor.  Chronic Pain Syndrome: Refilled: Oxycodone 5/325 mg one tablet twice a day as needed for pain.  We will continue the opioid monitoring program, this consists of regular clinic visits, examinations, urine drug screen, pill counts as well as use of [place] Controlled Substance Reporting system. A 12 month History has been reviewed on the [place] Controlled Substance Reporting System on 11/25/2022.   D/U in 1 month

## 2022-11-27 ENCOUNTER — Ambulatory Visit
Admission: EM | Admit: 2022-11-27 | Discharge: 2022-11-27 | Disposition: A | Discharge status: Home / Self Care | Payer: 59 | Attending: Urgent Care | Admitting: Urgent Care

## 2022-11-27 ENCOUNTER — Ambulatory Visit (INDEPENDENT_AMBULATORY_CARE_PROVIDER_SITE_OTHER): Payer: 59

## 2022-11-27 DIAGNOSIS — M25512 Pain in left shoulder: Secondary | ICD-10-CM

## 2022-11-27 MED ORDER — MELOXICAM 15 MG PO TABS: 15.0000 mg | ORAL_TABLET | Freq: Every day | ORAL | 0 refills | Status: AC

## 2022-11-27 NOTE — ED Provider Notes (Signed)
[name] - URGENT CARE CENTER  Note:  This document was prepared using Conservation officer, historic buildings and may include unintentional dictation errors.  MRN: [id number] DOB: 12-13-1960  Subjective:   Sherry [name] is a 62 y.o. female presenting for 3-day history of acute onset persistent and worsening left shoulder pain, limited range of motion.  No fall, trauma, weakness, numbness or tingling, radiation of her pain from the neck into the arm.  Patient awoke with her symptoms.  Denies any history of musculoskeletal disorders at the neck or shoulder.  No history of kidney disease.  No current facility-administered medications for this encounter.  Current Outpatient Medications:    aspirin EC 81 MG tablet, Take 81 mg by mouth daily. Swallow whole., Disp: , Rfl:    aspirin-acetaminophen-caffeine (EXCEDRIN MIGRAINE) 250-250-65 MG tablet, Take 2 tablets by mouth every 6 (six) hours as needed for headache., Disp: , Rfl:    cetirizine (ZYRTEC ALLERGY) 10 MG tablet, Take 1 tablet (10 mg total) by mouth daily., Disp: 30 tablet, Rfl: 2   cyclobenzaprine (FLEXERIL) 10 MG tablet, Take 1 tablet (10 mg total) by mouth 2 (two) times daily as needed., Disp: 40 tablet, Rfl: 0   docusate sodium (COLACE) 100 MG capsule, Take 1 capsule (100 mg total) by mouth every 12 (twelve) hours., Disp: 60 capsule, Rfl: 0   Dulaglutide (TRULICITY) 1.5 MG/0.5ML SOPN, Inject 1.5 mg into the skin once a week., Disp: 2 mL, Rfl: 11   glucose blood (ONETOUCH VERIO) test strip, Use as instructed to test blood sugar twice daily., Disp: 100 each, Rfl: 12   lisinopril-hydrochlorothiazide (ZESTORETIC) 20-25 MG tablet, Take 1 tablet by mouth daily., Disp: 30 tablet, Rfl: 11   meloxicam (MOBIC) 15 MG tablet, Take 1 tablet (15 mg total) by mouth daily., Disp: 30 tablet, Rfl: 0   oxyCODONE-acetaminophen (PERCOCET) 5-325 MG tablet, Take 1 tablet by mouth 2 (two) times daily as needed for severe pain. (DNF 12/16/22), Disp: 60  tablet, Rfl: 0   pantoprazole (PROTONIX) 40 MG tablet, Take 1 tablet (40 mg total) by mouth 2 (two) times daily., Disp: 120 tablet, Rfl: 2   polyethylene glycol powder (MIRALAX) 17 GM/SCOOP powder, Take 1 capful (17 g total) and mix in 8 oz of clear liquid and drink by mouth daily as needed for moderate constipation or severe constipation., Disp: 510 g, Rfl: 0   promethazine-dextromethorphan (PROMETHAZINE-DM) 6.25-15 MG/5ML syrup, Take 5 mLs by mouth 3 (three) times daily as needed for cough., Disp: 200 mL, Rfl: 0   simvastatin (ZOCOR) 40 MG tablet, Take 1 tablet (40 mg total) by mouth at bedtime., Disp: 90 tablet, Rfl: 1   Vitamin D, Ergocalciferol, (DRISDOL) 1.25 MG (50000 UNIT) CAPS capsule, Take 1 capsule (50,000 Units total) by mouth every 7 (seven) days., Disp: 7 capsule, Rfl: 0   Allergies  Allergen Reactions   Duloxetine Swelling   Duloxetine Hcl Other (See Comments)    Nausea, vomiting   Pregabalin Nausea And Vomiting and Swelling   Gabapentin Other (See Comments)    Weight gain    Past Medical History:  Diagnosis Date   Acid reflux disease    Anginal pain (HCC)    last time 04/18/13 in afternoon; referred to Dr. 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   Chest pain 04/07/2011   Chronic pain    Chronic pain 06/19/2014   Cough 09/25/2011   Depression    Fast heart beat  Fibromyalgia    Headache(784.0) 12/27/2012   History of blood transfusion 1981   childbirth   History of kidney stones    passed   Hypertension    Influenza A 07/08/2017   Left wrist pain 03/10/2013   Migraine    last one was 04/18/13   Sleep apnea    does not use CPAP   Trichomonas contact, treated      Past Surgical History:  Procedure Laterality Date   CHOLECYSTECTOMY     2008   ORIF CALCANEOUS FRACTURE Left 05/16/2013   Procedure: OPEN REDUCTION INTERNAL FIXATION (ORIF) LEFT CALCANEOUS FRACTURE;  Surgeon: Budd Palmer, MD;  Location: [facility] OR;  Service: Orthopedics;   Laterality: Left;   TOTAL ABDOMINAL HYSTERECTOMY     1986   WISDOM TOOTH EXTRACTION      Family History  Problem Relation Age of Onset   Diabetes Mother    Hypertension Mother    Depression Mother    Diabetes Sister    Hypertension Father    Cancer Father        Prostate   Alcohol abuse Father    Cancer Brother        Prostate   Breast cancer Neg Hx    Colon cancer Neg Hx    Colon polyps Neg Hx    Esophageal cancer Neg Hx    Stomach cancer Neg Hx    Rectal cancer Neg Hx     Social History   Tobacco Use   Smoking status: Never   Smokeless tobacco: Never  Vaping Use   Vaping Use: Never used  Substance Use Topics   Alcohol use: No   Drug use: No    ROS   Objective:   Vitals: BP 115/77 (BP Location: Right Arm)   Pulse 94   Temp 99 F (37.2 C) (Oral)   Resp 18   SpO2 95%   Physical Exam Constitutional:      General: She is not in acute distress.    Appearance: Normal appearance. She is well-developed. She is not ill-appearing, toxic-appearing or diaphoretic.  HENT:     Head: Normocephalic and atraumatic.     Nose: Nose normal.     Mouth/Throat:     Mouth: Mucous membranes are moist.  Eyes:     General: No scleral icterus.       Right eye: No discharge.        Left eye: No discharge.     Extraocular Movements: Extraocular movements intact.  Cardiovascular:     Rate and Rhythm: Normal rate.  Pulmonary:     Effort: Pulmonary effort is normal.  Musculoskeletal:     Left shoulder: Tenderness (extensive guarding) and bony tenderness present. No swelling, deformity, effusion, laceration or crepitus. Decreased range of motion (full passive ROM). Normal strength.  Skin:    General: Skin is warm and dry.  Neurological:     General: No focal deficit present.     Mental Status: She is alert and oriented to person, place, and time.  Psychiatric:        Mood and Affect: Mood normal.        Behavior: Behavior normal.     DG Shoulder Left  Result Date:  11/27/2022 CLINICAL DATA:  Shoulder pain EXAM: LEFT SHOULDER - 2+ VIEW COMPARISON:  None Available. FINDINGS: There is no evidence of fracture or dislocation. There is no evidence of arthropathy or other focal bone abnormality. Soft tissues are unremarkable. IMPRESSION: Negative. Electronically Signed  By: Jasmine Pang M.D.   On: 11/27/2022 19:05     Assessment and Plan :   PDMP not reviewed this encounter.  1. Acute pain of left shoulder    Patient has previously done well with meloxicam and therefore will use this again.  Follow-up with an orthopedist for consideration of further imaging, physical therapy or local injection.  Counseled patient on potential for adverse effects with medications prescribed/recommended today, ER and return-to-clinic precautions discussed, patient verbalized understanding.    Wallis Bamberg, PA-C 11/28/22 0830

## 2022-11-27 NOTE — ED Triage Notes (Signed)
Pt reports she woke up with left shoulder pain 3 days. Oxycodone gives some relief.

## 2022-12-02 ENCOUNTER — Encounter: Payer: Self-pay | Admitting: Registered Nurse

## 2022-12-11 ENCOUNTER — Other Ambulatory Visit: Payer: Self-pay | Admitting: Internal Medicine

## 2022-12-11 ENCOUNTER — Other Ambulatory Visit (HOSPITAL_COMMUNITY): Payer: Self-pay

## 2022-12-11 ENCOUNTER — Other Ambulatory Visit: Payer: Self-pay | Admitting: Student

## 2022-12-11 ENCOUNTER — Other Ambulatory Visit: Payer: Self-pay | Admitting: Physical Medicine and Rehabilitation

## 2022-12-11 DIAGNOSIS — E11649 Type 2 diabetes mellitus with hypoglycemia without coma: Secondary | ICD-10-CM

## 2022-12-11 DIAGNOSIS — M797 Fibromyalgia: Secondary | ICD-10-CM

## 2022-12-11 MED ORDER — VITAMIN D (ERGOCALCIFEROL) 1.25 MG (50000 UNIT) PO CAPS
50000.0000 [IU] | ORAL_CAPSULE | ORAL | 0 refills | Status: DC
  Filled 2022-12-11: qty 4, 28d supply, fill #0
  Filled 2023-01-18: qty 3, 21d supply, fill #1

## 2022-12-11 MED ORDER — CYCLOBENZAPRINE HCL 10 MG PO TABS
10.0000 mg | ORAL_TABLET | Freq: Two times a day (BID) | ORAL | 0 refills | Status: DC | PRN
Start: 2022-12-11 — End: 2023-01-26
  Filled 2022-12-11: qty 40, 20d supply, fill #0

## 2022-12-12 ENCOUNTER — Other Ambulatory Visit (HOSPITAL_COMMUNITY): Payer: Self-pay

## 2022-12-14 ENCOUNTER — Other Ambulatory Visit: Payer: Self-pay

## 2022-12-14 ENCOUNTER — Other Ambulatory Visit (HOSPITAL_COMMUNITY): Payer: Self-pay

## 2022-12-16 ENCOUNTER — Encounter: Payer: 59 | Attending: Physical Medicine and Rehabilitation | Admitting: Registered Nurse

## 2022-12-16 ENCOUNTER — Other Ambulatory Visit (HOSPITAL_COMMUNITY): Payer: Self-pay

## 2022-12-16 ENCOUNTER — Telehealth: Payer: Self-pay | Admitting: *Deleted

## 2022-12-16 ENCOUNTER — Encounter: Payer: Self-pay | Admitting: Registered Nurse

## 2022-12-16 VITALS — BP 126/84 | HR 97 | Ht 60.0 in | Wt 160.0 lb

## 2022-12-16 DIAGNOSIS — M7918 Myalgia, other site: Secondary | ICD-10-CM

## 2022-12-16 DIAGNOSIS — M25512 Pain in left shoulder: Secondary | ICD-10-CM

## 2022-12-16 DIAGNOSIS — M797 Fibromyalgia: Secondary | ICD-10-CM | POA: Diagnosis not present

## 2022-12-16 DIAGNOSIS — G894 Chronic pain syndrome: Secondary | ICD-10-CM

## 2022-12-16 DIAGNOSIS — M47816 Spondylosis without myelopathy or radiculopathy, lumbar region: Secondary | ICD-10-CM

## 2022-12-16 DIAGNOSIS — Z5181 Encounter for therapeutic drug level monitoring: Secondary | ICD-10-CM | POA: Diagnosis not present

## 2022-12-16 DIAGNOSIS — Z79899 Other long term (current) drug therapy: Secondary | ICD-10-CM

## 2022-12-16 NOTE — Telephone Encounter (Unsigned)
Call from asking why her refills was denied. Patient has been using the Voltaren Gel for pain in her shoulder.  Pain in shoulder has now increased and would like to have it checked. Went to an Urgent Care and nothing abnormal was found.  Told that she may have a Rotator Cuff problem. Patient also stated that she has nausea from time to time and has been using the Promethazine for that.   No available appointments. Was given ann appointment for 12/22/2022 at 8:45 AM.  Will contact patient for earlier appointment if we have a Cancellation.

## 2022-12-16 NOTE — Progress Notes (Signed)
Subjective:    Patient ID: Sherry [name], female    DOB: [birth year], 62 y.o.   MRN: [id number]  HPI: Sherry [name] is a 62 y.o. female who returns for follow up appointment for chronic pain and medication refill. She states her pain is located in her left shoulder, she denies falling, she went to urgent care on 11/27/2022, X-ray was reviewed. She refuses Physical Therapy at this time, she wants to speak with her PCP first. She also reports she has generalized Fibro pain and Lower Back pain. She rates her pain 8. Her current exercise regime is walking and performing stretching exercises.  DG: Left Shoulder FINDINGS: There is no evidence of fracture or dislocation. There is no evidence of arthropathy or other focal bone abnormality. Soft tissues are unremarkable.   IMPRESSION: Negative.   Sherry [name] Morphine equivalent is 15.00 MME.   Last oral swab was performed on 10/26/2022, it was consistent.    Pain Inventory Average Pain 10 Pain Right Now 8 My pain is intermittent and aching  In the last 24 hours, has pain interfered with the following? General activity 10 Relation with others 10 Enjoyment of life 9 What TIME of day is your pain at its worst? morning , daytime, evening, and night Sleep (in general) Poor  Pain is worse with: walking, bending, sitting, inactivity, and standing Pain improves with: rest, heat/ice, and medication Relief from Meds: 8  Family History  Problem Relation Age of Onset   Diabetes Mother    Hypertension Mother    Depression Mother    Diabetes Sister    Hypertension Father    Cancer Father        Prostate   Alcohol abuse Father    Cancer Brother        Prostate   Breast cancer Neg Hx    Colon cancer Neg Hx    Colon polyps Neg Hx    Esophageal cancer Neg Hx    Stomach cancer Neg Hx    Rectal cancer Neg Hx    Social History   Socioeconomic History   Marital status: Single    Spouse name: Not on file   Number of children: 2    Years of education: Not on file   Highest education level: 12th grade  Occupational History   Occupation: Unemployed  Tobacco Use   Smoking status: Never   Smokeless tobacco: Never  Vaping Use   Vaping Use: Never used  Substance and Sexual Activity   Alcohol use: No   Drug use: No   Sexual activity: Not on file  Other Topics Concern   Not on file  Social History Narrative   Lives with   2 Children: Ages 70 and 63   4 Grandchildren: 2, 3, 4 and 16   Brother      Diet: "Barely eats". Does not exercise due to pain.      03/2011- Currently unemployed. 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 Unsure of last pap.   Unsure of last Td   Unsure of last flu shot   Social Determinants of Health   Financial Resource Strain: Not on file  Food Insecurity: No Food Insecurity (09/11/2021)   Hunger Vital Sign    Worried About Running Out of Food in the Last Year: Never true    Ran Out of Food in the Last Year: Never true  Transportation Needs: No  Transportation Needs (09/11/2021)   PRAPARE - Administrator, Civil Service (Medical): No    Lack of Transportation (Non-Medical): No  Physical Activity: Not on file  Stress: Not on file  Social Connections: Not on file   Past Surgical History:  Procedure Laterality Date   CHOLECYSTECTOMY     2008   ORIF CALCANEOUS FRACTURE Left 05/16/2013   Procedure: OPEN REDUCTION INTERNAL FIXATION (ORIF) LEFT CALCANEOUS FRACTURE;  Surgeon: Budd Palmer, MD;  Location: [facility] OR;  Service: Orthopedics;  Laterality: Left;   TOTAL ABDOMINAL HYSTERECTOMY     1986   WISDOM TOOTH EXTRACTION     Past Surgical History:  Procedure Laterality Date   CHOLECYSTECTOMY     2008   ORIF CALCANEOUS FRACTURE Left 05/16/2013   Procedure: OPEN REDUCTION INTERNAL FIXATION (ORIF) LEFT CALCANEOUS FRACTURE;  Surgeon: Budd Palmer, MD;  Location: [facility] OR;  Service: Orthopedics;  Laterality: Left;   TOTAL  ABDOMINAL HYSTERECTOMY     1986   WISDOM TOOTH EXTRACTION     Past Medical History:  Diagnosis Date   Acid reflux disease    Anginal pain (HCC)    last time 04/18/13 in afternoon; referred to Dr. 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  Opioid Risk Score:   Fall Risk Score:  `1  Depression screen [facility] 2/9     10/26/2022    2:54 PM 08/28/2022    9:37 AM 08/03/2022    9:28 AM 07/28/2022    9:09 AM 05/12/2022    3:06 PM 03/03/2022    4:43 PM 12/04/2021    4:42 PM  Depression screen [facility] 2/9  Decreased Interest 0 0 0 0 0 0 0  Down, Depressed, Hopeless 0 0 0 0 0 0 0  PHQ - 2 Score 0 0 0 0 0 0 0  Altered sleeping     1 1 1   Tired, decreased energy     1 1 1   Change in appetite     0 0 0  Feeling bad or failure about yourself      0 0 0  Trouble concentrating     0 0 0  Moving slowly or fidgety/restless     0 0 0  Suicidal thoughts     0 0 0  PHQ-9 Score     2 2 2   Difficult doing work/chores     Not difficult at all Not difficult at all       Review of Systems  Musculoskeletal:  Positive for back pain.       B/L leg  shoulder arm pain  All other systems reviewed and are negative.      Objective:   Physical Exam Vitals and nursing note reviewed.  Constitutional:      Appearance: Normal appearance.  Cardiovascular:     Rate and Rhythm: Normal rate and regular rhythm.     Pulses: Normal pulses.     Heart sounds: Normal heart sounds.  Pulmonary:     Effort: Pulmonary effort  is normal.     Breath sounds: Normal breath sounds.  Musculoskeletal:     Cervical back: Normal range of motion and neck supple.     Comments: Normal Muscle Bulk and Muscle Testing Reveals:  Upper Extremities: Right: Full ROM and Muscle Strength 5/5 Left Upper Extremity: Decreased ROM 90 Degrees and Muscle Strength 5/5 Left AC Joint Tenderness  Lumbar Paraspinal Tenderness: L-3-L-5 Lower Extremities: Full ROM and Muscle Strength 5/5 Arises from Chair with ease Narrow Based  Gait     Skin:    General: Skin is warm and dry.  Neurological:     Mental Status: She is alert and oriented to person, place, and time.  Psychiatric:        Mood and Affect: Mood normal.        Behavior: Behavior normal.         Assessment & Plan:  Fibromyalgia: Continue HEP as Tolerated. Continue to Monitor. 12/16/2022 Myofascial Pain Syndrome: Continue Flexeril. Continue to Monitor. 12/16/2022 Chronic Pain Syndrome: Refilled: Oxycodone 5/325 mg one tablet twice a day as needed for pain.  We will continue the opioid monitoring program, this consists of regular clinic visits, examinations, urine drug screen, pill counts as well as use of [place] Controlled Substance Reporting system. A 12 month History has been reviewed on the [place] Controlled Substance Reporting System on 12/17/2022.  4. Left Shoulder Pain: She had a X-ray on 11/27/2022 at Urgent care. Denies Falling, she refuses Physical Therapy referral at this time, she would like to speak with her PCP prior to referral for physical therapy.    F/U in 1 month with Dr Carlis Abbott

## 2022-12-17 ENCOUNTER — Ambulatory Visit (INDEPENDENT_AMBULATORY_CARE_PROVIDER_SITE_OTHER): Payer: 59 | Admitting: Student

## 2022-12-17 ENCOUNTER — Other Ambulatory Visit (HOSPITAL_COMMUNITY): Payer: Self-pay

## 2022-12-17 ENCOUNTER — Other Ambulatory Visit: Payer: Self-pay

## 2022-12-17 ENCOUNTER — Encounter: Payer: Self-pay | Admitting: Student

## 2022-12-17 VITALS — BP 112/80 | HR 94 | Temp 98.2°F | Resp 28 | Ht 60.0 in | Wt 159.2 lb

## 2022-12-17 DIAGNOSIS — Z794 Long term (current) use of insulin: Secondary | ICD-10-CM

## 2022-12-17 DIAGNOSIS — K219 Gastro-esophageal reflux disease without esophagitis: Secondary | ICD-10-CM

## 2022-12-17 DIAGNOSIS — M25512 Pain in left shoulder: Secondary | ICD-10-CM | POA: Diagnosis not present

## 2022-12-17 DIAGNOSIS — E119 Type 2 diabetes mellitus without complications: Secondary | ICD-10-CM

## 2022-12-17 DIAGNOSIS — Z0000 Encounter for general adult medical examination without abnormal findings: Secondary | ICD-10-CM

## 2022-12-17 MED ORDER — DICLOFENAC SODIUM 1 % EX GEL: 2.0000 g | Freq: Four times a day (QID) | CUTANEOUS | 1 refills | Status: DC

## 2022-12-17 MED ORDER — PROMETHAZINE HCL 12.5 MG PO TABS: 12.5000 mg | ORAL_TABLET | Freq: Three times a day (TID) | ORAL | 0 refills | Status: DC | PRN

## 2022-12-17 NOTE — Patient Instructions (Addendum)
Thank you, Ms.Ettel T Dinino for allowing [place] to provide your care today. Today we discussed your shoulder pain.  -Referral sent for physical therapy to help with your left shoulder pain.  -Refill sent for Voltaren gel to help with shoulder pain.  -You can try tylenol or ibuprofen to help with the shoulder pain.  -Please contact your insurance about Trulicity. Let us know if we can to adjust the prescription or send a new one.    Referrals ordered today:   Referral Orders         Ambulatory referral to Physical Therapy       I have ordered the following medication/changed the following medications:   Stop the following medications: Medications Discontinued During This Encounter  Medication Reason   promethazine-dextromethorphan (PROMETHAZINE-DM) 6.25-15 MG/5ML syrup      Start the following medications: Meds ordered this encounter  Medications   promethazine (PHENERGAN) 12.5 MG tablet    Sig: Take 1 tablet (12.5 mg total) by mouth every 8 (eight) hours as needed for nausea or vomiting.    Dispense:  20 tablet    Refill:  0   diclofenac Sodium (VOLTAREN ARTHRITIS PAIN) 1 % GEL    Sig: Apply 2 g topically 4 (four) times daily.    Dispense:  50 g    Refill:  1     Follow up:  1-2 months    Should you have any questions or concerns please call the internal medicine clinic at [phone].    Rana Snare, D.O. [facility]

## 2022-12-17 NOTE — Telephone Encounter (Signed)
Patient has Community education officer and Med [employer].

## 2022-12-17 NOTE — Progress Notes (Signed)
CC: L shoulder pain, medication refill  HPI:  [name] is a 62 y.o. female living with a history stated below and presents today for L shoulder pain and medication refill. Please see problem based assessment and plan for additional details.  Past Medical History:  Diagnosis Date   Acid reflux disease    Anginal pain (HCC)    last time 04/18/13 in afternoon; referred to Dr. Dietrich Pates   Anxiety    Cancer [facility])    uterine- had hysterectomy   Chest pain 04/07/2011   Chronic pain    Chronic pain 06/19/2014   Cough 09/25/2011   Depression    Fast heart beat    Fibromyalgia    Headache(784.0) 12/27/2012   History of blood transfusion 1981   childbirth   History of kidney stones    passed   Hypertension    Influenza A 07/08/2017   Left wrist pain 03/10/2013   Migraine    last one was 04/18/13   Sleep apnea    does not use CPAP   Trichomonas contact, treated     Current Outpatient Medications on File Prior to Visit  Medication Sig Dispense Refill   aspirin EC 81 MG tablet Take 81 mg by mouth daily. Swallow whole.     aspirin-acetaminophen-caffeine (EXCEDRIN MIGRAINE) 250-250-65 MG tablet Take 2 tablets by mouth every 6 (six) hours as needed for headache.     cetirizine (ZYRTEC ALLERGY) 10 MG tablet Take 1 tablet (10 mg total) by mouth daily. 30 tablet 2   cyclobenzaprine (FLEXERIL) 10 MG tablet Take 1 tablet (10 mg total) by mouth 2 (two) times daily as needed. 40 tablet 0   docusate sodium (COLACE) 100 MG capsule Take 1 capsule (100 mg total) by mouth every 12 (twelve) hours. 60 capsule 0   Dulaglutide (TRULICITY) 1.5 MG/0.5ML SOPN Inject 1.5 mg into the skin once a week. 2 mL 11   glucose blood (ONETOUCH VERIO) test strip Use as instructed to test blood sugar twice daily. 100 each 12   lisinopril-hydrochlorothiazide (ZESTORETIC) 20-25 MG tablet Take 1 tablet by mouth daily. 30 tablet 11   meloxicam (MOBIC) 15 MG tablet Take 1 tablet (15 mg total) by mouth daily. 30  tablet 0   oxyCODONE-acetaminophen (PERCOCET) 5-325 MG tablet Take 1 tablet by mouth 2 (two) times daily as needed for severe pain. (DNF 12/16/22) 60 tablet 0   pantoprazole (PROTONIX) 40 MG tablet Take 1 tablet (40 mg total) by mouth 2 (two) times daily. 120 tablet 2   polyethylene glycol powder (MIRALAX) 17 GM/SCOOP powder Take 1 capful (17 g total) and mix in 8 oz of clear liquid and drink by mouth daily as needed for moderate constipation or severe constipation. 510 g 0   promethazine-dextromethorphan (PROMETHAZINE-DM) 6.25-15 MG/5ML syrup Take 5 mLs by mouth 3 (three) times daily as needed for cough. 200 mL 0   simvastatin (ZOCOR) 40 MG tablet Take 1 tablet (40 mg total) by mouth at bedtime. 90 tablet 1   Vitamin D, Ergocalciferol, (DRISDOL) 1.25 MG (50000 UNIT) CAPS capsule Take 1 capsule (50,000 Units total) by mouth every 7 (seven) days. 7 capsule 0   No current facility-administered medications on file prior to visit.   Review of Systems: ROS negative except for what is noted on the assessment and plan.  Vitals:   12/17/22 1317  BP: 112/80  Pulse: 94  Resp: (!) 28  Temp: 98.2 F (36.8 C)  TempSrc: Oral  SpO2: 98%  Weight: 159 lb 3.2 oz (72.2 kg)  Height: 5' (1.524 m)   Physical Exam: Constitutional: well-appearing female sitting in chair comfortably, in no acute distress Cardiovascular: regular rate Pulmonary/Chest: normal work of breathing on room air MSK:  --Left Shoulder: no obvious deformity or swelling, TTP mainly anterior shoulder, pain with passive and active ROM in flexion, extension, abduction and adduction Neurological: alert & oriented x 3 Skin: warm and dry  Assessment & Plan:   Acute pain of left shoulder Presents with left shoulder pain for 1 month. Denies any trauma or injury. Describes as sharp and at times throbbing pain. Difficulty moving left shoulder in most directions. No other associated symptoms. Went to urgent care, shoulder xray without acute  fracture or dislocation. Has tried Voltaren gel with some relief. Shoulder exam showed limited ROM in all planes passively and actively. Suspect with findings possibly adhesive capsulitis. Treat supportively and recommend PT which patient agrees to.   Plan -Referral to PT  -Refilled Voltaren gel  -Continue supportive care  Type 2 diabetes mellitus not at goal [facility]) Last A1c 7.3 in late May. Last OV increased Trulicity to 1.5 mg weekly. States she has been unable to obtain it due to high co-pay of ~$900. She plans to reach out to her insurance to discuss about her co-pay.    Plan -Pt to check on Trulicity co-pay and call back  -If no success, consider switching to another GLP-1 or start SGLT-2 or metformin  GERD Requests refill for Phenergan. States nausea at times from acid reflux and takes Phenergan as needed. Reports it has been infrequent. States she has been prescribed Phenergan for quite some time. Remains on PPI. Will refill today but may need to further evaluate GERD status at future visit. Refill sent for Phenergan 12.5 mg q8h PRN with no refills.   Healthcare maintenance Declined pap smear today.    Patient discussed with Dr. Docia Furl, D.O. [facility], PGY-2 Phone: [phone] Date 12/17/2022 Time 1:26 PM

## 2022-12-18 DIAGNOSIS — M25512 Pain in left shoulder: Secondary | ICD-10-CM | POA: Insufficient documentation

## 2022-12-18 NOTE — Assessment & Plan Note (Addendum)
Requests refill for Phenergan. States nausea at times from acid reflux and takes Phenergan as needed. Reports it has been infrequent. States she has been prescribed Phenergan for quite some time. Remains on PPI. Will refill today but may need to further evaluate GERD status at future visit. Refill sent for Phenergan 12.5 mg q8h PRN with no refills.

## 2022-12-18 NOTE — Assessment & Plan Note (Addendum)
Presents with left shoulder pain for 1 month. Denies any trauma or injury. Describes as sharp and at times throbbing pain. Difficulty moving left shoulder in most directions. No other associated symptoms. Went to urgent care, shoulder xray without acute fracture or dislocation. Has tried Voltaren gel with some relief. Shoulder exam showed limited ROM in all planes passively and actively. Suspect with findings possibly adhesive capsulitis. Treat supportively and recommend PT which patient agrees to.   Plan -Referral to PT  -Refilled Voltaren gel  -Continue supportive care

## 2022-12-18 NOTE — Assessment & Plan Note (Signed)
Declined pap smear today 

## 2022-12-18 NOTE — Assessment & Plan Note (Signed)
Last A1c 7.3 in late May. Last OV increased Trulicity to 1.5 mg weekly. States she has been unable to obtain it due to high co-pay of ~$900. She plans to reach out to her insurance to discuss about her co-pay.    Plan -Pt to check on Trulicity co-pay and call back  -If no success, consider switching to another GLP-1 or start SGLT-2 or metformin

## 2022-12-22 ENCOUNTER — Encounter: Payer: 59 | Admitting: Student

## 2022-12-22 NOTE — Progress Notes (Signed)
 Internal Medicine Clinic Attending  Case discussed with the resident at the time of the visit.  We reviewed the resident's history and exam and pertinent patient test results.  I agree with the assessment, diagnosis, and plan of care documented in the resident's note.  

## 2023-01-04 ENCOUNTER — Encounter: Payer: 59 | Admitting: Internal Medicine

## 2023-01-16 DIAGNOSIS — Z791 Long term (current) use of non-steroidal anti-inflammatories (NSAID): Secondary | ICD-10-CM | POA: Diagnosis not present

## 2023-01-16 DIAGNOSIS — R32 Unspecified urinary incontinence: Secondary | ICD-10-CM | POA: Diagnosis not present

## 2023-01-16 DIAGNOSIS — Z8249 Family history of ischemic heart disease and other diseases of the circulatory system: Secondary | ICD-10-CM | POA: Diagnosis not present

## 2023-01-16 DIAGNOSIS — Z7982 Long term (current) use of aspirin: Secondary | ICD-10-CM | POA: Diagnosis not present

## 2023-01-16 DIAGNOSIS — M1990 Unspecified osteoarthritis, unspecified site: Secondary | ICD-10-CM | POA: Diagnosis not present

## 2023-01-16 DIAGNOSIS — E785 Hyperlipidemia, unspecified: Secondary | ICD-10-CM | POA: Diagnosis not present

## 2023-01-16 DIAGNOSIS — E1136 Type 2 diabetes mellitus with diabetic cataract: Secondary | ICD-10-CM | POA: Diagnosis not present

## 2023-01-16 DIAGNOSIS — M62838 Other muscle spasm: Secondary | ICD-10-CM | POA: Diagnosis not present

## 2023-01-16 DIAGNOSIS — Z809 Family history of malignant neoplasm, unspecified: Secondary | ICD-10-CM | POA: Diagnosis not present

## 2023-01-16 DIAGNOSIS — K5900 Constipation, unspecified: Secondary | ICD-10-CM | POA: Diagnosis not present

## 2023-01-16 DIAGNOSIS — Z008 Encounter for other general examination: Secondary | ICD-10-CM | POA: Diagnosis not present

## 2023-01-16 DIAGNOSIS — K219 Gastro-esophageal reflux disease without esophagitis: Secondary | ICD-10-CM | POA: Diagnosis not present

## 2023-01-16 DIAGNOSIS — J309 Allergic rhinitis, unspecified: Secondary | ICD-10-CM | POA: Diagnosis not present

## 2023-01-18 ENCOUNTER — Other Ambulatory Visit (HOSPITAL_COMMUNITY): Payer: Self-pay

## 2023-01-18 ENCOUNTER — Other Ambulatory Visit: Payer: Self-pay | Admitting: Registered Nurse

## 2023-01-19 ENCOUNTER — Other Ambulatory Visit (HOSPITAL_COMMUNITY): Payer: Self-pay

## 2023-01-19 MED ORDER — OXYCODONE-ACETAMINOPHEN 5-325 MG PO TABS
1.0000 | ORAL_TABLET | Freq: Two times a day (BID) | ORAL | 0 refills | Status: DC | PRN
  Filled 2023-01-19: qty 60, 30d supply, fill #0

## 2023-01-19 NOTE — Telephone Encounter (Signed)
PMP was Reviewed.  Oxycodone e-scribed to pharmacy. ' Call placed to Sherry [name], regarding the above, she verbalizes understanding.

## 2023-01-25 ENCOUNTER — Other Ambulatory Visit (HOSPITAL_COMMUNITY): Payer: Self-pay

## 2023-01-26 ENCOUNTER — Other Ambulatory Visit (HOSPITAL_COMMUNITY): Payer: Self-pay

## 2023-01-26 ENCOUNTER — Other Ambulatory Visit: Payer: Self-pay | Admitting: Internal Medicine

## 2023-01-26 DIAGNOSIS — M797 Fibromyalgia: Secondary | ICD-10-CM

## 2023-01-26 DIAGNOSIS — E11649 Type 2 diabetes mellitus with hypoglycemia without coma: Secondary | ICD-10-CM

## 2023-01-26 MED ORDER — CYCLOBENZAPRINE HCL 10 MG PO TABS
10.0000 mg | ORAL_TABLET | Freq: Two times a day (BID) | ORAL | 0 refills | Status: DC | PRN
Start: 2023-01-26 — End: 2023-02-02
  Filled 2023-01-26: qty 40, 20d supply, fill #0

## 2023-01-26 MED ORDER — DICLOFENAC SODIUM 1 % EX GEL
2.0000 g | Freq: Four times a day (QID) | CUTANEOUS | 0 refills | Status: DC
Start: 2023-01-26 — End: 2023-03-19
  Filled 2023-01-26 – 2023-02-15 (×2): qty 100, 12d supply, fill #0

## 2023-01-26 MED ORDER — PROMETHAZINE HCL 12.5 MG PO TABS
12.5000 mg | ORAL_TABLET | Freq: Three times a day (TID) | ORAL | 0 refills | Status: DC | PRN
Start: 2023-01-26 — End: 2023-03-19
  Filled 2023-01-26 – 2023-02-15 (×2): qty 20, 7d supply, fill #0

## 2023-02-02 ENCOUNTER — Other Ambulatory Visit: Payer: Self-pay | Admitting: Physical Medicine and Rehabilitation

## 2023-02-02 ENCOUNTER — Encounter: Payer: Self-pay | Admitting: Physical Medicine and Rehabilitation

## 2023-02-02 ENCOUNTER — Encounter: Payer: 59 | Attending: Physical Medicine and Rehabilitation | Admitting: Physical Medicine and Rehabilitation

## 2023-02-02 ENCOUNTER — Other Ambulatory Visit (HOSPITAL_COMMUNITY): Payer: Self-pay

## 2023-02-02 VITALS — BP 99/68 | HR 101 | Ht 60.0 in | Wt 157.0 lb

## 2023-02-02 DIAGNOSIS — M797 Fibromyalgia: Secondary | ICD-10-CM | POA: Diagnosis not present

## 2023-02-02 DIAGNOSIS — M7918 Myalgia, other site: Secondary | ICD-10-CM | POA: Diagnosis not present

## 2023-02-02 DIAGNOSIS — R252 Cramp and spasm: Secondary | ICD-10-CM | POA: Diagnosis not present

## 2023-02-02 MED ORDER — CYCLOBENZAPRINE HCL 10 MG PO TABS
10.0000 mg | ORAL_TABLET | Freq: Two times a day (BID) | ORAL | 0 refills | Status: DC | PRN
Start: 2023-02-02 — End: 2023-03-19
  Filled 2023-02-15: qty 40, 20d supply, fill #0

## 2023-02-02 MED ORDER — VITAMIN D (ERGOCALCIFEROL) 1.25 MG (50000 UNIT) PO CAPS: 50000.0000 [IU] | ORAL_CAPSULE | ORAL | 0 refills | Status: DC

## 2023-02-02 NOTE — Progress Notes (Signed)
Subjective:    Patient ID: Sherry [name], female    DOB: 1961-01-06, 62 y.o.   MRN: [id number]  HPI  Sherry [name] is a 62 year old female who presents for f/u of her fibromyalgia.   1) Fibromyalgia -has been hurting Her pain has been getting worse.  -has diffuse pain -nothing has been most helpful for her -she knows it will never go away -she works about 40 hours per week -her legs hurt very badly and her gait is impaired when the pain is severe -she has no husband  2) Muscle cramps: -she has been taking mustard, she can't take too much because it upsets her stomach -wakes up with this -she can't take too many bananas due to their potassium -she does not have any flexeril for over a month  3) Stress due to illness of family member -father was diagnosed with stage 3 lung cancer -she took on a part time job so she can have extra money to go visit him -she finds her sister does not tell the truth even though the doctor told her the diagnosis -doctor does not want to operate  -he is going through chemo and radiation.   She takes Tylenol but it doesn't help.   She took Hydrocodone and Percocet before. She was taking these as needed- waiting until the pain is very severe.   Prior history: She is currently has a headache. She gets these often. She is not interested in Botox. She takes Excedrin as needed.  She also has a lot of muscle tightness.  Time helps the pain.  She has had fibromyalgia for may years. She used to follow with Dr. Riley Kill. She uses a heating pad which does provide relief. Pain medications take the edge off the pain. She is unable to tolerate family retreats in [place] because of the cold. She has been in worse pain since last visit. Ice does not help.   She takes Ibuprofen as she cannot tolerate the Tylenol. Sometimes this helps and sometimes it doesn't. She takes about 5-6 of the Ibuprofen. She takes it more when she gets home when her pain is worse.   She  wants to exercise to lose some weights. Her grandkids are 21 to 4- she has 33 grandchildren and 4 great grandkids. Weight is currently 175 lbs, BMI 32.26.   She wants to date. She wants to be young again.   She did not tolerate Lyrica or Cymbalta well. Gabapentin made her gain weight. She cannot recall her response to Amitriptyline.   Average pain is 10/10, pain right now is 7/10 and feels aching on both sides of her body in multiple joints and muscles.   Pain Inventory Average Pain 10 Pain Right Now 8 My pain is aching  In the last 24 hours, has pain interfered with the following? General activity 10 Relation with others 0 Enjoyment of life 10 What TIME of day is your pain at its worst? Morning,daytime, evening, night Sleep (in general) Poor  Pain is worse with: walking, bending, sitting, inactivity, and standing Pain improves with: rest, medication, and heat Relief from Meds: 8   Family History  Problem Relation Age of Onset   Diabetes Mother    Hypertension Mother    Depression Mother    Diabetes Sister    Hypertension Father    Cancer Father        Prostate   Alcohol abuse Father    Cancer Brother  Prostate   Breast cancer Neg Hx    Colon cancer Neg Hx    Colon polyps Neg Hx    Esophageal cancer Neg Hx    Stomach cancer Neg Hx    Rectal cancer Neg Hx    Social History   Socioeconomic History   Marital status: Single    Spouse name: Not on file   Number of children: 2   Years of education: Not on file   Highest education level: 12th grade  Occupational History   Occupation: Unemployed  Tobacco Use   Smoking status: Never   Smokeless tobacco: Never  Vaping Use   Vaping status: Never Used  Substance and Sexual Activity   Alcohol use: No   Drug use: No   Sexual activity: Not on file  Other Topics Concern   Not on file  Social History Narrative   Lives with   2 Children: Ages 44 and 3   4 Grandchildren: 2, 3, 4 and 69   Brother      Diet:  "Barely eats". Does not exercise due to pain.      03/2011- Currently unemployed. 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 Unsure of last pap.   Unsure of last Td   Unsure of last flu shot   Social Determinants of Health   Financial Resource Strain: Not on file  Food Insecurity: No Food Insecurity (09/11/2021)   Hunger Vital Sign    Worried About Running Out of Food in the Last Year: Never true    Ran Out of Food in the Last Year: Never true  Transportation Needs: No Transportation Needs (09/11/2021)   PRAPARE - Administrator, Civil Service (Medical): No    Lack of Transportation (Non-Medical): No  Physical Activity: Not on file  Stress: Not on file  Social Connections: Unknown (10/20/2021)   [facility]   Social Network    Social Network: Not on file   Past Surgical History:  Procedure Laterality Date   CHOLECYSTECTOMY     2008   ORIF CALCANEOUS FRACTURE Left 05/16/2013   Procedure: OPEN REDUCTION INTERNAL FIXATION (ORIF) LEFT CALCANEOUS FRACTURE;  Surgeon: Budd Palmer, MD;  Location: [facility] OR;  Service: Orthopedics;  Laterality: Left;   TOTAL ABDOMINAL HYSTERECTOMY     1986   WISDOM TOOTH EXTRACTION     Past Medical History:  Diagnosis Date   Acid reflux disease    Anginal pain (HCC)    last time 04/18/13 in afternoon; referred to Dr. 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  Opioid Risk Score:   Fall Risk Score:  `1  Depression screen [facility] 2/9     12/17/2022  1:23 PM 12/16/2022    8:45 AM 10/26/2022    2:54 PM 08/28/2022    9:37 AM 08/03/2022    9:28 AM 07/28/2022    9:09 AM 05/12/2022    3:06 PM  Depression screen PHQ 2/9  Decreased Interest 0 0 0 0 0 0 0  Down, Depressed, Hopeless 0 0 0 0 0 0 0  PHQ - 2 Score 0 0 0 0 0 0 0  Altered sleeping       1  Tired, decreased energy       1  Change in appetite       0  Feeling bad or failure about yourself        0  Trouble concentrating       0  Moving slowly or fidgety/restless       0  Suicidal thoughts       0  PHQ-9 Score       2  Difficult doing work/chores       Not difficult at all    Review of Systems  Constitutional: Negative.   HENT: Negative.    Eyes: Negative.   Respiratory: Negative.    Cardiovascular: Negative.   Gastrointestinal:  Positive for constipation and nausea.  Endocrine: Negative.   Genitourinary: Negative.   Musculoskeletal:  Positive for arthralgias, back pain, myalgias and neck pain.       Spasms   Skin: Negative.   Neurological:  Positive for headaches.  Hematological: Negative.   All other systems reviewed and are negative.      Objective:   Physical Exam Gen: no distress, normal appearing, 157 lbs, BMI 30.66 Gen: no distress, normal appearing HEENT: oral mucosa pink and moist, NCAT Cardio: Reg rate Chest: normal effort, normal rate of breathing Abd: soft, non-distended Ext: no edema Psych: pleasant, normal affect Skin: intact Musculoskeletal: Diffuse tenderness in multiple joints on both sides of the body. 5/5 strength in both upper and lower extremities with the exception of 4/5 DF/PF in left foot (prior surgery) Psych: pleasant, normal affect     Assessment & Plan:  Sherry [name] is a 62 year old woman who presents to establish care for fibromyalgia with worsening symptoms. She also has weakness and fatigue.   Fibromyalgia is a clinical syndrome  characterized by widespread pain and tenderness in addition to a variety of symptoms, including fatigue, anxiety, depression, and sleep disturbances. Fibromyalgia affects 3-5% of women and up to 25% of women with other rheumatological conditions.   Exercise is a first-line treatment for the disease, and can help with both the physical and emotional symptoms, as well as improving overall health and function.   -referred to aquatherapy -conitnue percocet 5mg  BID  -discussed the collective dance she had at church and how this contributed to her current fatigue -Provided with a pain relief journal and discussed that it contains foods and lifestyle tips to naturally help to improve pain. Discussed that these lifestyle strategies are also very good for health unlike some medications which can have negative side effects. Discussed that the act of keeping a journal can be therapeutic and helpful to realize patterns what helps to trigger and alleviate pain.   -discussed that stress can worsen fibromyalgia pain -discussed that rest helps -discussed that heat promotes good blood flow -She tends to have worst symptoms in the winter, the cold worsens her symptoms -Commended her idea to bring her a heating pad at work.  -Pain is currently worse in her lower back and bilateral  lower extremities.  -She is motivated to continue working and works 40 hours per week -She has failed all neuropathic medications. Discussed side effects of Savella and she chose not to try this. Discussed side effects of tylenol with codeine and she prefers this option. Advised to use for only severe pain.  -Pain contract and UDS signed previously -discussed topamax but she defers -Discussed current symptoms of pain and history of pain.  -Discussed benefits of exercise in reducing pain. -Discussed following foods that may reduce pain: 1) Ginger (especially studied for arthritis)- reduce leukotriene production to decrease  inflammation 2) Blueberries- high in phytonutrients that decrease inflammation 3) Salmon- marine omega-3s reduce joint swelling and pain 4) Pumpkin seeds- reduce inflammation 5) dark chocolate- reduces inflammation 6) turmeric- reduces inflammation 7) tart cherries - reduce pain and stiffness 8) extra virgin olive oil - its compound olecanthal helps to block prostaglandins  9) chili peppers- can be eaten or applied topically via capsaicin 10) mint- helpful for headache, muscle aches, joint pain, and itching 11) garlic- reduces inflammation  Link to further information on diet for chronic pain: http://www.bray.com/   Obesity (BMI 34.26), 175 lbs.  -Our goal is to increase her exercise, improve her nutrition.  -Provided exercise and dietary counseling.   -Provided referral for aquatic therapy.   -Meloxicam did not help.  Myofascial pain syndrome -Provided script for Lidocaine 5% patch. Can consider trigger point injections for myofascial pain in future with lidocaine and saline  -She walks a lot as part of her work  -refilled flexeril  Prescribed Zynex Nexwave and heating/cooling blanket   Prescribing Home Zynex NexWave Stimulator Device and supplies as needed. IFC, NMES and TENS medically necessary Treatment Rx: Daily @ 30-40 minutes per treatment PRN. Zynex NexWave only, no substitutions. Treatment Goals: 1) To reduce and/or eliminate pain 2) To improve functional capacity and Activities of daily living 3) To reduce or prevent the need for oral medications 4) To improve circulation in the injured region 5) To decrease or prevent muscle spasm and muscle atrophy 6) To provide a self-management tool to the patient The patient has not sufficiently improved with conservative care. Numerous studies indexed by Medline and PubMed.gov have shown Neuromuscular, Interferential, and TENS stimulators to reduce pain, improve  function, and reduce medication use in injured patients. Continued use of this evidence based, safe, drug free treatment is both reasonable and medically necessary at this time.   -discussed trigger point injections but she defers  -She does not have much appetite. She has been trying to drink more water. She likes salmon and seafood. She loves stewed chicken.    Her goal is to be more active so she can spend more time with her grandchildren.   All questions answered. RTC in 1 month for f/I   Muscle cramps -recommended magnesium supplement at night.  -discussed that pumpkin seeds are natural source of magnesium  Stress due to illness of family member: -discussed her father's illness and how she wants to help him

## 2023-02-09 ENCOUNTER — Other Ambulatory Visit (HOSPITAL_COMMUNITY): Payer: Self-pay

## 2023-02-15 ENCOUNTER — Other Ambulatory Visit: Payer: Self-pay | Admitting: Physical Medicine and Rehabilitation

## 2023-02-15 ENCOUNTER — Other Ambulatory Visit (HOSPITAL_COMMUNITY): Payer: Self-pay

## 2023-02-15 ENCOUNTER — Other Ambulatory Visit: Payer: Self-pay

## 2023-02-15 ENCOUNTER — Other Ambulatory Visit: Payer: Self-pay | Admitting: Registered Nurse

## 2023-02-15 MED ORDER — VITAMIN D (ERGOCALCIFEROL) 1.25 MG (50000 UNIT) PO CAPS
50000.0000 [IU] | ORAL_CAPSULE | ORAL | 0 refills | Status: DC
  Filled 2023-02-15 – 2023-02-16 (×2): qty 4, 28d supply, fill #0
  Filled 2023-02-16 (×2): qty 7, 49d supply, fill #0
  Filled 2023-02-18 – 2023-02-22 (×2): qty 4, 28d supply, fill #0

## 2023-02-15 MED ORDER — OXYCODONE-ACETAMINOPHEN 5-325 MG PO TABS
1.0000 | ORAL_TABLET | Freq: Two times a day (BID) | ORAL | 0 refills | Status: DC | PRN
  Filled 2023-02-15 – 2023-02-22 (×5): qty 60, 30d supply, fill #0

## 2023-02-15 NOTE — Telephone Encounter (Signed)
Patient is requesting a refills for Vitamin D & Oxycodone 5-325 MG. Please sent to [facility].   Thank you

## 2023-02-16 ENCOUNTER — Other Ambulatory Visit (HOSPITAL_COMMUNITY): Payer: Self-pay

## 2023-02-18 ENCOUNTER — Other Ambulatory Visit (HOSPITAL_COMMUNITY): Payer: Self-pay

## 2023-02-22 ENCOUNTER — Other Ambulatory Visit (HOSPITAL_COMMUNITY): Payer: Self-pay

## 2023-03-02 ENCOUNTER — Encounter: Payer: 59 | Admitting: Registered Nurse

## 2023-03-03 ENCOUNTER — Ambulatory Visit: Payer: No Typology Code available for payment source | Attending: Internal Medicine

## 2023-03-17 ENCOUNTER — Encounter: Payer: 59 | Attending: Physical Medicine and Rehabilitation | Admitting: Registered Nurse

## 2023-03-17 ENCOUNTER — Other Ambulatory Visit (HOSPITAL_COMMUNITY): Payer: Self-pay

## 2023-03-17 ENCOUNTER — Encounter: Payer: Self-pay | Admitting: Registered Nurse

## 2023-03-17 VITALS — BP 128/83 | HR 105 | Ht 60.0 in | Wt 161.0 lb

## 2023-03-17 DIAGNOSIS — G8929 Other chronic pain: Secondary | ICD-10-CM | POA: Insufficient documentation

## 2023-03-17 DIAGNOSIS — M7918 Myalgia, other site: Secondary | ICD-10-CM | POA: Diagnosis not present

## 2023-03-17 DIAGNOSIS — M47816 Spondylosis without myelopathy or radiculopathy, lumbar region: Secondary | ICD-10-CM | POA: Diagnosis not present

## 2023-03-17 DIAGNOSIS — Z5181 Encounter for therapeutic drug level monitoring: Secondary | ICD-10-CM | POA: Diagnosis not present

## 2023-03-17 DIAGNOSIS — M797 Fibromyalgia: Secondary | ICD-10-CM | POA: Diagnosis not present

## 2023-03-17 DIAGNOSIS — M546 Pain in thoracic spine: Secondary | ICD-10-CM | POA: Diagnosis not present

## 2023-03-17 DIAGNOSIS — G894 Chronic pain syndrome: Secondary | ICD-10-CM | POA: Diagnosis not present

## 2023-03-17 DIAGNOSIS — Z79899 Other long term (current) drug therapy: Secondary | ICD-10-CM

## 2023-03-17 MED ORDER — OXYCODONE-ACETAMINOPHEN 5-325 MG PO TABS
1.0000 | ORAL_TABLET | Freq: Two times a day (BID) | ORAL | 0 refills | Status: DC | PRN
  Filled 2023-03-17 – 2023-03-22 (×2): qty 60, 30d supply, fill #0

## 2023-03-17 NOTE — Progress Notes (Signed)
Subjective:    Patient ID: Sherry [name], female    DOB: [birth year], 62 y.o.   MRN: [id number]  HPI: Sherry [name] is a 62 y.o. female who returns for follow up appointment for chronic pain and medication refill. She states her pain is located in her bilateral shoulders, mid-lower back pain and bilateral lower extremity pain she describes as a achy pain. She rates her pain 8. Her current exercise regime is walking and performing stretching exercises.   Sherry [name] equivalent is 15.00 MME.   Sherry [name] reports she took extra oxycodone due to increase intensity of pain, she didn't call the office. She was educated on the narcotic policy, and self medicating she verbalizes understanding. She realizes we will not refill early, a letter will be sent to Sherry [name] regarding the above, she verbalizes understanding.   Oral Swab was Performed today.   Pain Inventory Average Pain 10 Pain Right Now 8 My pain is aching  In the last 24 hours, has pain interfered with the following? General activity 0 Relation with others 0 Enjoyment of life 10 What TIME of day is your pain at its worst? morning , daytime, evening, and night Sleep (in general) Fair  Pain is worse with: walking, bending, sitting, inactivity, and standing Pain improves with: rest and heat/ice Relief from Meds: 6  Family History  Problem Relation Age of Onset   Diabetes Mother    Hypertension Mother    Depression Mother    Diabetes Sister    Hypertension Father    Cancer Father        Prostate   Alcohol abuse Father    Cancer Brother        Prostate   Breast cancer Neg Hx    Colon cancer Neg Hx    Colon polyps Neg Hx    Esophageal cancer Neg Hx    Stomach cancer Neg Hx    Rectal cancer Neg Hx    Social History   Socioeconomic History   Marital status: Single    Spouse name: Not on file   Number of children: 2   Years of education: Not on file   Highest education level: 12th grade  Occupational  History   Occupation: Unemployed  Tobacco Use   Smoking status: Never   Smokeless tobacco: Never  Vaping Use   Vaping status: Never Used  Substance and Sexual Activity   Alcohol use: No   Drug use: No   Sexual activity: Not on file  Other Topics Concern   Not on file  Social History Narrative   Lives with   2 Children: Ages 20 and 77   4 Grandchildren: 2, 3, 4 and 29   Brother      Diet: "Barely eats". Does not exercise due to pain.      03/2011- Currently unemployed. 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 Unsure of last pap.   Unsure of last Td   Unsure of last flu shot   Social Determinants of Health   Financial Resource Strain: Not on file  Food Insecurity: No Food Insecurity (09/11/2021)   Hunger Vital Sign    Worried About Running Out of Food in the Last Year: Never true    Ran Out of Food in the Last Year: Never true  Transportation Needs: No Transportation Needs (09/11/2021)   PRAPARE - Transportation    Lack of  Transportation (Medical): No    Lack of Transportation (Non-Medical): No  Physical Activity: Not on file  Stress: Not on file  Social Connections: Unknown (10/20/2021)   Received from [facility]   Social Network    Social Network: Not on file   Past Surgical History:  Procedure Laterality Date   CHOLECYSTECTOMY     2008   ORIF CALCANEOUS FRACTURE Left 05/16/2013   Procedure: OPEN REDUCTION INTERNAL FIXATION (ORIF) LEFT CALCANEOUS FRACTURE;  Surgeon: Budd Palmer, MD;  Location: [facility] OR;  Service: Orthopedics;  Laterality: Left;   TOTAL ABDOMINAL HYSTERECTOMY     1986   WISDOM TOOTH EXTRACTION     Past Surgical History:  Procedure Laterality Date   CHOLECYSTECTOMY     2008   ORIF CALCANEOUS FRACTURE Left 05/16/2013   Procedure: OPEN REDUCTION INTERNAL FIXATION (ORIF) LEFT CALCANEOUS FRACTURE;  Surgeon: Budd Palmer, MD;  Location: [facility] OR;  Service: Orthopedics;   Laterality: Left;   TOTAL ABDOMINAL HYSTERECTOMY     1986   WISDOM TOOTH EXTRACTION     Past Medical History:  Diagnosis Date   Acid reflux disease    Anginal pain (HCC)    last time 04/18/13 in afternoon; referred to Dr. 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   Chest pain 04/07/2011   Chronic pain    Chronic pain 06/19/2014   Cough 09/25/2011   Depression    Fast heart beat    Fibromyalgia    Headache(784.0) 12/27/2012   History of blood transfusion 1981   childbirth   History of kidney stones    passed   Hypertension    Influenza A 07/08/2017   Left wrist pain 03/10/2013   Migraine    last one was 04/18/13   Sleep apnea    does not use CPAP   Trichomonas contact, treated    BP 128/83   Pulse (!) 105   Ht 5' (1.524 m)   Wt 161 lb (73 kg)   SpO2 98%   BMI 31.44 kg/m   Opioid Risk Score:   Fall Risk Score:  `1  Depression screen [facility] 2/9     03/17/2023    9:11 AM 02/02/2023    9:37 AM 12/17/2022    1:23 PM 12/16/2022    8:45 AM 10/26/2022    2:54 PM 08/28/2022    9:37 AM 08/03/2022    9:28 AM  Depression screen PHQ 2/9  Decreased Interest 0 0 0 0 0 0 0  Down, Depressed, Hopeless 0 0 0 0 0 0 0  PHQ - 2 Score 0 0 0 0 0 0 0      Review of Systems  Musculoskeletal:  Positive for back pain and gait problem.       B/L shoulder pain  B/L arm pain  B/L leg pain   All other systems reviewed and are negative.     Objective:   Physical Exam Vitals and nursing note reviewed.  Constitutional:      Appearance: Normal appearance.  Cardiovascular:     Rate and Rhythm: Normal rate and regular rhythm.     Pulses: Normal pulses.     Heart sounds: Normal heart sounds.  Pulmonary:     Effort: Pulmonary effort is normal.     Breath sounds: Normal breath sounds.  Musculoskeletal:     Cervical back: Normal range of motion and neck supple.     Comments: Normal  Muscle Bulk and Muscle Testing Reveals:  Upper Extremities: Decreased ROM 90 Degrees   and Muscle Strength  5/5 Bilateral AC Joint Tenderness  Thoracic and Lumbar Hypersensitivity Lower Extremities: Full ROM and Muscle Strength 5/5 Arises from Table slowly Antalgic  Gait     Skin:    General: Skin is warm and dry.  Neurological:     Mental Status: She is alert and oriented to person, place, and time.  Psychiatric:        Mood and Affect: Mood normal.        Behavior: Behavior normal.         Assessment & Plan:  Fibromyalgia: Continue HEP as Tolerated. Continue to Monitor. 03/17/2023 Myofascial Pain Syndrome: Continue Flexeril. Continue to Monitor. 03/17/2023 Chronic Pain Syndrome: Refilled: Oxycodone 5/325 mg one tablet twice a day as needed for pain.  We will continue the opioid monitoring program, this consists of regular clinic visits, examinations, urine drug screen, pill counts as well as use of [place] Controlled Substance Reporting system. A 12 month History has been reviewed on the [place] Controlled Substance Reporting System on 03/17/2023.   4. Lumbar Facet Arthropathy: Continue HEP as Tolerated. Continue current medication regimen. Continue to monitor.  5. Bilateral Thoracic Back Pain: Continue HEP as Tolerated. Continue to Monitor.   F/U in 1 month with Dr Carlis Abbott

## 2023-03-19 ENCOUNTER — Other Ambulatory Visit: Payer: Self-pay | Admitting: Physical Medicine and Rehabilitation

## 2023-03-19 ENCOUNTER — Other Ambulatory Visit (HOSPITAL_COMMUNITY): Payer: Self-pay

## 2023-03-19 ENCOUNTER — Other Ambulatory Visit: Payer: Self-pay | Admitting: Internal Medicine

## 2023-03-19 ENCOUNTER — Other Ambulatory Visit: Payer: Self-pay

## 2023-03-19 DIAGNOSIS — M797 Fibromyalgia: Secondary | ICD-10-CM

## 2023-03-19 DIAGNOSIS — E782 Mixed hyperlipidemia: Secondary | ICD-10-CM

## 2023-03-19 DIAGNOSIS — K219 Gastro-esophageal reflux disease without esophagitis: Secondary | ICD-10-CM

## 2023-03-19 DIAGNOSIS — E11649 Type 2 diabetes mellitus with hypoglycemia without coma: Secondary | ICD-10-CM

## 2023-03-19 MED ORDER — SIMVASTATIN 40 MG PO TABS
40.0000 mg | ORAL_TABLET | Freq: Every day | ORAL | 1 refills | Status: DC
  Filled 2023-03-19 (×2): qty 30, 30d supply, fill #0
  Filled 2023-04-22: qty 30, 30d supply, fill #1
  Filled 2023-06-08: qty 30, 30d supply, fill #2
  Filled 2023-07-14: qty 30, 30d supply, fill #3
  Filled 2023-10-05: qty 30, 30d supply, fill #4
  Filled 2023-11-25: qty 30, 30d supply, fill #5

## 2023-03-19 MED ORDER — PANTOPRAZOLE SODIUM 40 MG PO TBEC
40.0000 mg | DELAYED_RELEASE_TABLET | Freq: Two times a day (BID) | ORAL | 2 refills | Status: DC
Start: 2023-03-19 — End: 2023-11-25
  Filled 2023-03-19: qty 120, 60d supply, fill #0
  Filled 2023-06-08: qty 120, 60d supply, fill #1
  Filled 2023-08-17: qty 60, 30d supply, fill #2
  Filled 2023-10-05: qty 60, 30d supply, fill #3

## 2023-03-19 MED ORDER — DICLOFENAC SODIUM 1 % EX GEL
2.0000 g | Freq: Four times a day (QID) | CUTANEOUS | 0 refills | Status: DC
Start: 2023-03-19 — End: 2023-04-22
  Filled 2023-03-19: qty 100, 12d supply, fill #0

## 2023-03-19 MED ORDER — PROMETHAZINE HCL 12.5 MG PO TABS
12.5000 mg | ORAL_TABLET | Freq: Three times a day (TID) | ORAL | 0 refills | Status: DC | PRN
Start: 2023-03-19 — End: 2023-08-17
  Filled 2023-03-19: qty 20, 7d supply, fill #0

## 2023-03-19 NOTE — Telephone Encounter (Signed)
Next appt scheduled 11/5 with Dr Nooruddin.

## 2023-03-21 MED ORDER — CYCLOBENZAPRINE HCL 10 MG PO TABS
10.0000 mg | ORAL_TABLET | Freq: Two times a day (BID) | ORAL | 0 refills | Status: DC | PRN
Start: 2023-03-21 — End: 2023-04-22
  Filled 2023-03-21: qty 40, 20d supply, fill #0

## 2023-03-21 MED ORDER — VITAMIN D (ERGOCALCIFEROL) 1.25 MG (50000 UNIT) PO CAPS
50000.0000 [IU] | ORAL_CAPSULE | ORAL | 0 refills | Status: DC
  Filled 2023-03-21: qty 4, 28d supply, fill #0
  Filled 2023-04-22: qty 3, 21d supply, fill #1

## 2023-03-22 ENCOUNTER — Other Ambulatory Visit (HOSPITAL_COMMUNITY): Payer: Self-pay

## 2023-03-23 LAB — DRUG TOX MONITOR 1 W/CONF, ORAL FLD
Alprazolam: NEGATIVE ng/mL (ref <0.50)
Amphetamines: NEGATIVE ng/mL (ref <10)
Barbiturates: NEGATIVE ng/mL (ref <10)
Benzodiazepines: POSITIVE ng/mL — AB (ref <0.50)
Buprenorphine: NEGATIVE ng/mL (ref <0.10)
Chlordiazepoxide: NEGATIVE ng/mL (ref <0.50)
Clonazepam: 1.19 ng/mL — ABNORMAL HIGH (ref <0.50)
Cocaine: NEGATIVE ng/mL (ref <5.0)
Codeine: NEGATIVE ng/mL (ref <2.5)
Diazepam: NEGATIVE ng/mL (ref <0.50)
Dihydrocodeine: NEGATIVE ng/mL (ref <2.5)
Fentanyl: NEGATIVE ng/mL (ref <0.10)
Flunitrazepam: NEGATIVE ng/mL (ref <0.50)
Flurazepam: NEGATIVE ng/mL (ref <0.50)
Heroin Metabolite: NEGATIVE ng/mL (ref <1.0)
Hydrocodone: NEGATIVE ng/mL (ref <2.5)
Hydromorphone: NEGATIVE ng/mL (ref <2.5)
Lorazepam: NEGATIVE ng/mL (ref <0.50)
MARIJUANA: NEGATIVE ng/mL (ref <2.5)
MDMA: NEGATIVE ng/mL (ref <10)
Meprobamate: NEGATIVE ng/mL (ref <2.5)
Methadone: NEGATIVE ng/mL (ref <5.0)
Midazolam: NEGATIVE ng/mL (ref <0.50)
Morphine: NEGATIVE ng/mL (ref <2.5)
Nicotine Metabolite: NEGATIVE ng/mL (ref <5.0)
Nordiazepam: NEGATIVE ng/mL (ref <0.50)
Norhydrocodone: NEGATIVE ng/mL (ref <2.5)
Noroxycodone: NEGATIVE ng/mL (ref <2.5)
Opiates: POSITIVE ng/mL — AB (ref <2.5)
Oxazepam: NEGATIVE ng/mL (ref <0.50)
Oxycodone: 4.4 ng/mL — ABNORMAL HIGH (ref <2.5)
Oxymorphone: NEGATIVE ng/mL (ref <2.5)
Phencyclidine: NEGATIVE ng/mL (ref <10)
Tapentadol: NEGATIVE ng/mL (ref <5.0)
Temazepam: NEGATIVE ng/mL (ref <0.50)
Tramadol: NEGATIVE ng/mL (ref <5.0)
Triazolam: NEGATIVE ng/mL (ref <0.50)
Zolpidem: NEGATIVE ng/mL (ref <5.0)

## 2023-03-23 LAB — DRUG TOX ALC METAB W/CON, ORAL FLD: Alcohol Metabolite: NEGATIVE ng/mL (ref <25)

## 2023-03-24 ENCOUNTER — Telehealth: Payer: Self-pay | Admitting: Registered Nurse

## 2023-03-24 NOTE — Telephone Encounter (Signed)
UDS results was reviewed.  PMP was Reviewed.  + Clonazepam  Call placed to Sherry [name] she admits to taking her daughter Clonazepam once, she was under stress she reports. She was instructed to schedule an appointment with her PCP regarding her Stress, she verbalizes understanding. Narcotic contract was reviewed, she will received a letter, and she is aware if this occurs again she will be discharged from our office. Will have Sybil send her a formal letter, she verbalizes understanding.

## 2023-03-30 NOTE — Telephone Encounter (Signed)
Formal warning letter sent [employer].

## 2023-04-13 ENCOUNTER — Encounter: Payer: 59 | Admitting: Student

## 2023-04-16 ENCOUNTER — Ambulatory Visit (INDEPENDENT_AMBULATORY_CARE_PROVIDER_SITE_OTHER): Payer: 59 | Admitting: Student

## 2023-04-16 ENCOUNTER — Other Ambulatory Visit (HOSPITAL_COMMUNITY): Payer: Self-pay

## 2023-04-16 VITALS — BP 117/74 | HR 102 | Temp 98.4°F | Ht 60.0 in | Wt 166.4 lb

## 2023-04-16 DIAGNOSIS — E119 Type 2 diabetes mellitus without complications: Secondary | ICD-10-CM | POA: Diagnosis not present

## 2023-04-16 DIAGNOSIS — F419 Anxiety disorder, unspecified: Secondary | ICD-10-CM

## 2023-04-16 LAB — POCT GLYCOSYLATED HEMOGLOBIN (HGB A1C): Hemoglobin A1C: 6.8 % — AB (ref 4.0–5.6)

## 2023-04-16 LAB — GLUCOSE, CAPILLARY: Glucose-Capillary: 129 mg/dL — ABNORMAL HIGH (ref 70–99)

## 2023-04-16 MED ORDER — HYDROXYZINE HCL 10 MG PO TABS
10.0000 mg | ORAL_TABLET | Freq: Three times a day (TID) | ORAL | 0 refills | Status: AC | PRN
  Filled 2023-04-16: qty 30, 10d supply, fill #0

## 2023-04-16 NOTE — Patient Instructions (Signed)
Thank you so much for coming to the clinic today!   I have sent in a medication called Atarax or Hydroxyzine which we have discussed. As soon as the pharmacist responds I will let you know. And the medication you can take over the counter is called Nexium, it should be in a yellow and purple bottle.   If you have any questions please feel free to the call the clinic at anytime at [phone]. It was a pleasure seeing you!  Best, Dr. Thomasene Ripple

## 2023-04-17 LAB — MICROALBUMIN / CREATININE URINE RATIO
Creatinine, Urine: 75.8 mg/dL
Microalb/Creat Ratio: <4 mg/g{creat} (ref 0–29)
Microalbumin, Urine: <3 ug/mL

## 2023-04-17 NOTE — Assessment & Plan Note (Signed)
A1c improving from 7.3 five months ago to 6.8 today. She is not currently taking any anti-glycemic agents at this time. She was previously supposed to be on trulicity however had to stop given insurance restraints. Will collect urine microalbumin today as well as reach out to the pharmacy to discuss best affordable options for her for glycemic control.   Plan:  - Urine microalbumin today  - Reach out to pharmacy

## 2023-04-17 NOTE — Assessment & Plan Note (Addendum)
Pt states she works multiple jobs in order to provide for her family. At times, she feels overwhelmed. She was previously taking ambien to help stay calm, however has not been on it for a few years now (likely due to her taking Percocet for her fibromyalgia). She repeatedly states she is not depressed, however just feels overwhelmed from time to time.   Will trial Hydroxyzine 10mg  TID PRN

## 2023-04-17 NOTE — Progress Notes (Signed)
CC: Diabetes follow up  HPI:  [name] is a 62 y.o. female living with a history stated below and presents today for diabetes follow up. Please see problem based assessment and plan for additional details.  Past Medical History:  Diagnosis Date   Acid reflux disease    Anginal pain (HCC)    last time 04/18/13 in afternoon; referred to Dr. 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 Swallow whole.     aspirin-acetaminophen-caffeine (EXCEDRIN MIGRAINE) 250-250-65 MG tablet Take 2 tablets by mouth every 6 (six) hours as needed for headache.     cetirizine (ZYRTEC ALLERGY) 10 MG tablet Take 1 tablet (10 mg total) by mouth daily. 30 tablet 2   cyclobenzaprine (FLEXERIL) 10 MG tablet Take 1 tablet (10 mg total) by mouth 2 (two) times daily as needed. 40 tablet 0   diclofenac Sodium (VOLTAREN ARTHRITIS PAIN) 1 % GEL Apply 2 g topically 4 (four) times daily. 50 g 1   diclofenac Sodium (VOLTAREN) 1 % GEL Apply 2 grams topically 4 (four) times daily. 100 g 0   docusate sodium (COLACE) 100 MG capsule Take 1 capsule (100 mg total) by mouth every 12 (twelve) hours. 60 capsule 0   Dulaglutide (TRULICITY) 1.5 MG/0.5ML SOPN Inject 1.5 mg into the skin once a week. 2 mL 11   glucose blood (ONETOUCH VERIO) test strip Use as instructed to test blood sugar twice daily. 100 each 12    lisinopril-hydrochlorothiazide (ZESTORETIC) 20-25 MG tablet Take 1 tablet by mouth daily. 30 tablet 11   meloxicam (MOBIC) 15 MG tablet Take 1 tablet (15 mg total) by mouth daily. 30 tablet 0   oxyCODONE-acetaminophen (PERCOCET) 5-325 MG tablet Take 1 tablet by mouth 2 (two) times daily as needed for severe pain. 60 tablet 0   pantoprazole (PROTONIX) 40 MG tablet Take 1 tablet (40 mg total) by mouth 2 (two) times daily. 120 tablet 2   polyethylene glycol powder (MIRALAX) 17 GM/SCOOP powder Take 1 capful (17 g total) and mix in 8 oz of clear liquid and drink by mouth daily as needed for moderate constipation or severe constipation. 510 g 0   promethazine (PHENERGAN) 12.5 MG tablet Take 1 tablet (12.5 mg total) by mouth every 8 (eight) hours as needed for nausea or vomiting. 20 tablet 0   promethazine (PHENERGAN) 12.5 MG tablet Take 1 tablet (12.5 mg total) by mouth every 8 (eight) hours as needed for nausea or vomiting. 20 tablet 0   simvastatin (ZOCOR) 40 MG tablet Take 1 tablet (40 mg total) by mouth at bedtime. 90 tablet 1   Vitamin D, Ergocalciferol, (DRISDOL) 1.25 MG (50000 UNIT) CAPS capsule Take 1 capsule (50,000 Units total)  by mouth every 7 (seven) days. 7 capsule 0   No current facility-administered medications on file prior to visit.    Family History  Problem Relation Age of Onset   Diabetes Mother    Hypertension Mother    Depression Mother    Diabetes Sister    Hypertension Father    Cancer Father        Prostate   Alcohol abuse Father    Cancer Brother        Prostate   Breast cancer Neg Hx    Colon cancer Neg Hx    Colon polyps Neg Hx    Esophageal cancer Neg Hx    Stomach cancer Neg Hx    Rectal cancer Neg Hx     Social History   Socioeconomic History   Marital status: Single    Spouse name: Not on file   Number of children: 2   Years of education: Not on file   Highest education level: 12th grade  Occupational History   Occupation: Unemployed  Tobacco Use    Smoking status: Never   Smokeless tobacco: Never  Vaping Use   Vaping status: Never Used  Substance and Sexual Activity   Alcohol use: No   Drug use: No   Sexual activity: Not on file  Other Topics Concern   Not on file  Social History Narrative   Lives with   2 Children: Ages 66 and 16   4 Grandchildren: 2, 3, 4 and 69   Brother      Diet: "Barely eats". Does not exercise due to pain.      03/2011- Currently unemployed. 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 Unsure of last pap.   Unsure of last Td   Unsure of last flu shot   Social Determinants of Health   Financial Resource Strain: Not on file  Food Insecurity: No Food Insecurity (09/11/2021)   Hunger Vital Sign    Worried About Running Out of Food in the Last Year: Never true    Ran Out of Food in the Last Year: Never true  Transportation Needs: No Transportation Needs (09/11/2021)   PRAPARE - Administrator, Civil Service (Medical): No    Lack of Transportation (Non-Medical): No  Physical Activity: Not on file  Stress: Not on file  Social Connections: Unknown (10/20/2021)   Received from [facility]   Social Network    Social Network: Not on file  Intimate Partner Violence: Unknown (09/10/2021)   [facility] from [facility]   HITS    Physically Hurt: Not on file    Insult or Talk Down To: Not on file    Threaten Physical Harm: Not on file    Scream or Curse: Not on file    Review of Systems: ROS negative except for what is noted on the assessment and plan.  Vitals:   04/16/23 1031  BP: 117/74  Pulse: (!) 102  Temp: 98.4 F (36.9 C)  TempSrc: Oral  SpO2: 100%  Weight: 166 lb 6.4 oz (75.5 kg)  Height: 5' (1.524 m)    Physical Exam: Constitutional: well-appearing female  in no acute distress Cardiovascular: regular rate and rhythm, no m/r/g Pulmonary/Chest: normal work of breathing on room air, lungs  clear to auscultation bilaterally Abdominal: soft, non-tender, non-distended  Assessment & Plan:   Type 2 diabetes mellitus not at goal [facility])  A1c improving from 7.3 five months ago to 6.8 today. She is not currently taking any anti-glycemic agents at this time. She was previously supposed to be on trulicity however had to stop given insurance restraints. Will collect urine microalbumin today as well as reach out to the pharmacy to discuss best affordable options for her for glycemic control.   Plan:  - Urine microalbumin today  - Reach out to pharmacy  Anxiety Pt states she works multiple jobs in order to provide for her family. At times, she feels overwhelmed. She was previously taking ambien to help stay calm, however has not been on it for a few years now (likely due to her taking Percocet for her fibromyalgia). She repeatedly states she is not depressed, however just feels overwhelmed from time to time.   Will trial Hydroxyzine 10mg  TID PRN     Patient discussed with Dr. Rockey Situ, M.D. [facility] Internal Medicine, PGY-2 Pager: [phone] Date 04/17/2023 Time 10:54 AM

## 2023-04-19 ENCOUNTER — Encounter: Payer: 59 | Admitting: Physical Medicine and Rehabilitation

## 2023-04-21 ENCOUNTER — Other Ambulatory Visit (HOSPITAL_COMMUNITY): Payer: Self-pay

## 2023-04-22 ENCOUNTER — Other Ambulatory Visit: Payer: Self-pay | Admitting: Internal Medicine

## 2023-04-22 ENCOUNTER — Other Ambulatory Visit (HOSPITAL_COMMUNITY): Payer: Self-pay

## 2023-04-22 ENCOUNTER — Other Ambulatory Visit: Payer: Self-pay | Admitting: Registered Nurse

## 2023-04-22 ENCOUNTER — Telehealth: Payer: Self-pay

## 2023-04-22 ENCOUNTER — Other Ambulatory Visit: Payer: Self-pay | Admitting: Physical Medicine and Rehabilitation

## 2023-04-22 ENCOUNTER — Encounter: Payer: 59 | Admitting: Registered Nurse

## 2023-04-22 DIAGNOSIS — M797 Fibromyalgia: Secondary | ICD-10-CM

## 2023-04-22 MED ORDER — DICLOFENAC SODIUM 1 % EX GEL
2.0000 g | Freq: Four times a day (QID) | CUTANEOUS | 0 refills | Status: DC
  Filled 2023-04-22: qty 100, 12d supply, fill #0

## 2023-04-22 MED ORDER — OXYCODONE-ACETAMINOPHEN 5-325 MG PO TABS
1.0000 | ORAL_TABLET | Freq: Two times a day (BID) | ORAL | 0 refills | Status: DC | PRN
  Filled 2023-04-22: qty 60, 30d supply, fill #0

## 2023-04-22 MED ORDER — CYCLOBENZAPRINE HCL 10 MG PO TABS
10.0000 mg | ORAL_TABLET | Freq: Two times a day (BID) | ORAL | 2 refills | Status: DC | PRN
  Filled 2023-04-22: qty 40, 20d supply, fill #0
  Filled 2023-06-08: qty 40, 20d supply, fill #1
  Filled 2023-07-14: qty 40, 20d supply, fill #2

## 2023-04-22 NOTE — Progress Notes (Signed)
Internal Medicine Clinic Attending  Case discussed with the resident at the time of the visit.  We reviewed the resident's history and exam and pertinent patient test results.  I agree with the assessment, diagnosis, and plan of care documented in the resident's note.  

## 2023-04-22 NOTE — Telephone Encounter (Signed)
Patient called she stated she can not afford Trulicity, patient stated the medication is $900 at which she can't afford. Patient is requesting a alternative medication at a cheaper cost. Please return patients call.

## 2023-04-22 NOTE — Telephone Encounter (Signed)
PMP was Reviewed.  Oxycodone e-scribed to pharmacy ' Ms. Hermance was called regarding the above, no answer. Left message for Ms. Oshima.

## 2023-04-23 ENCOUNTER — Other Ambulatory Visit (HOSPITAL_COMMUNITY): Payer: Self-pay

## 2023-04-26 ENCOUNTER — Other Ambulatory Visit (HOSPITAL_COMMUNITY): Payer: Self-pay

## 2023-04-28 ENCOUNTER — Telehealth: Payer: Self-pay

## 2023-04-28 ENCOUNTER — Other Ambulatory Visit: Payer: Self-pay | Admitting: Student

## 2023-04-28 ENCOUNTER — Other Ambulatory Visit (HOSPITAL_COMMUNITY): Payer: Self-pay

## 2023-04-28 DIAGNOSIS — E11649 Type 2 diabetes mellitus with hypoglycemia without coma: Secondary | ICD-10-CM

## 2023-04-28 MED ORDER — PROMETHAZINE HCL 12.5 MG PO TABS: 12.5000 mg | ORAL_TABLET | Freq: Three times a day (TID) | ORAL | 0 refills | Status: DC | PRN

## 2023-04-28 MED ORDER — BLOOD GLUCOSE MONITORING SUPPL DEVI: 1.0000 | Freq: Three times a day (TID) | 0 refills | Status: DC

## 2023-04-28 MED ORDER — LANCETS MISC. MISC: 1.0000 | Freq: Three times a day (TID) | 0 refills | Status: AC

## 2023-04-28 MED ORDER — BLOOD GLUCOSE TEST VI STRP: 1.0000 | ORAL_STRIP | Freq: Three times a day (TID) | 0 refills | Status: AC

## 2023-04-28 MED ORDER — EMPAGLIFLOZIN 10 MG PO TABS: 10.0000 mg | ORAL_TABLET | Freq: Every day | ORAL | 3 refills | Status: DC

## 2023-04-28 NOTE — Telephone Encounter (Signed)
Please address.

## 2023-04-28 NOTE — Progress Notes (Signed)
Called and spoke with the patient. She said the Trulicity she was prescribed would be too expensive, $900 out of pocket. She stated she is unable to tolerate metformin and has seen her family members do poorly on this medication so she would prefer not to take it. I spoke with her about Jardiance and she was willing to try it. I sent in a prescription for her.    She would like to speak about weight loss medications at her next visit in February. She also requested refills on a few of her medications, which I have sent to her pharmacy.

## 2023-04-28 NOTE — Telephone Encounter (Signed)
Prior Authorization for patient (Jardiance 10MG  tablets) came through on cover my meds was submitted with last office notes and labs awaiting approval or denial.  KEY:[id number]

## 2023-04-29 ENCOUNTER — Other Ambulatory Visit: Payer: Self-pay | Admitting: Student

## 2023-04-29 NOTE — Telephone Encounter (Signed)
Decision:Approved Denyse Amass (Key: [id number]) PA Case ID #: [id number] Rx #: [id number] Need Help? Call us at [phone] Outcome Approved today by [facility] NCPDP 2017 Your PA request has been approved. Additional information will be provided in the approval communication. (Message 1145) Authorization Expiration Date: 04/27/2024 Drug Jardiance 10MG  tablets ePA cloud logo Form [employer] Electronic PA Form [phone] NCPDP) Original Claim Info [phone]

## 2023-04-30 ENCOUNTER — Other Ambulatory Visit: Payer: Self-pay | Admitting: Student

## 2023-04-30 DIAGNOSIS — E119 Type 2 diabetes mellitus without complications: Secondary | ICD-10-CM

## 2023-04-30 MED ORDER — EMPAGLIFLOZIN 10 MG PO TABS: 10.0000 mg | ORAL_TABLET | Freq: Every day | ORAL | 3 refills | Status: DC

## 2023-04-30 NOTE — Telephone Encounter (Signed)
Per med assist team, prior authorization for Jardiance approved recently, will send this medicine to pharmacy.

## 2023-05-11 ENCOUNTER — Encounter: Payer: 59 | Admitting: Registered Nurse

## 2023-05-19 ENCOUNTER — Encounter: Payer: Self-pay | Admitting: Registered Nurse

## 2023-05-19 ENCOUNTER — Encounter: Payer: 59 | Attending: Physical Medicine and Rehabilitation | Admitting: Registered Nurse

## 2023-05-19 ENCOUNTER — Other Ambulatory Visit (HOSPITAL_COMMUNITY): Payer: Self-pay

## 2023-05-19 VITALS — BP 117/78 | HR 92 | Ht 60.0 in | Wt 157.0 lb

## 2023-05-19 DIAGNOSIS — M7918 Myalgia, other site: Secondary | ICD-10-CM | POA: Insufficient documentation

## 2023-05-19 DIAGNOSIS — M47816 Spondylosis without myelopathy or radiculopathy, lumbar region: Secondary | ICD-10-CM

## 2023-05-19 DIAGNOSIS — M546 Pain in thoracic spine: Secondary | ICD-10-CM

## 2023-05-19 DIAGNOSIS — R5383 Other fatigue: Secondary | ICD-10-CM | POA: Diagnosis not present

## 2023-05-19 DIAGNOSIS — G8929 Other chronic pain: Secondary | ICD-10-CM | POA: Diagnosis not present

## 2023-05-19 DIAGNOSIS — Z5181 Encounter for therapeutic drug level monitoring: Secondary | ICD-10-CM

## 2023-05-19 DIAGNOSIS — G894 Chronic pain syndrome: Secondary | ICD-10-CM

## 2023-05-19 DIAGNOSIS — M797 Fibromyalgia: Secondary | ICD-10-CM

## 2023-05-19 DIAGNOSIS — Z79899 Other long term (current) drug therapy: Secondary | ICD-10-CM

## 2023-05-19 MED ORDER — OXYCODONE-ACETAMINOPHEN 5-325 MG PO TABS
1.0000 | ORAL_TABLET | Freq: Two times a day (BID) | ORAL | 0 refills | Status: DC | PRN
  Filled 2023-05-19 – 2023-06-08 (×2): qty 60, 30d supply, fill #0

## 2023-05-19 NOTE — Progress Notes (Signed)
Subjective:    Patient ID: Sherry [name], female    DOB: 1960-11-29, 62 y.o.   MRN: [id number]  HPI: Sherry [name] is a 62 y.o. female who returns for follow up appointment for chronic pain and medication refill. She states her pain is located in her  upper- lower back pain, she also reports she is experiencing fatigue. She is working two full time jobs, we will obtain a Vitamin D Level, today, she verbalizes understanding. She rates her pain 8. Her current exercise regime is walking and performing stretching exercises.  Ms. Badger Morphine equivalent is 15.00  MME.   Last Oral Swab was Performed on 03/17/2023, see note for details.      Pain Inventory Average Pain 10 Pain Right Now 8 My pain is aching  In the last 24 hours, has pain interfered with the following? General activity 2 Relation with others 0 Enjoyment of life 0 What TIME of day is your pain at its worst? morning , daytime, evening, and night Sleep (in general) Poor  Pain is worse with: walking, bending, sitting, inactivity, and standing Pain improves with: rest, heat/ice, and medication Relief from Meds: 5  Family History  Problem Relation Age of Onset   Diabetes Mother    Hypertension Mother    Depression Mother    Diabetes Sister    Hypertension Father    Cancer Father        Prostate   Alcohol abuse Father    Cancer Brother        Prostate   Breast cancer Neg Hx    Colon cancer Neg Hx    Colon polyps Neg Hx    Esophageal cancer Neg Hx    Stomach cancer Neg Hx    Rectal cancer Neg Hx    Social History   Socioeconomic History   Marital status: Single    Spouse name: Not on file   Number of children: 2   Years of education: Not on file   Highest education level: 12th grade  Occupational History   Occupation: Unemployed  Tobacco Use   Smoking status: Never   Smokeless tobacco: Never  Vaping Use   Vaping status: Never Used  Substance and Sexual Activity   Alcohol use: No   Drug  use: No   Sexual activity: Not on file  Other Topics Concern   Not on file  Social History Narrative   Lives with   2 Children: Ages 16 and 65   4 Grandchildren: 2, 3, 4 and 44   Brother      Diet: "Barely eats". Does not exercise due to pain.      03/2011- Currently unemployed. 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 Unsure of last pap.   Unsure of last Td   Unsure of last flu shot   Social Determinants of Health   Financial Resource Strain: Not on file  Food Insecurity: No Food Insecurity (09/11/2021)   Hunger Vital Sign    Worried About Running Out of Food in the Last Year: Never true    Ran Out of Food in the Last Year: Never true  Transportation Needs: No Transportation Needs (09/11/2021)   PRAPARE - Administrator, Civil Service (Medical): No    Lack of Transportation (Non-Medical): No  Physical Activity: Not on file  Stress: Not on file  Social Connections: Unknown (10/20/2021)   Received  from [facility]   Social Network    Social Network: Not on file   Past Surgical History:  Procedure Laterality Date   CHOLECYSTECTOMY     2008   ORIF CALCANEOUS FRACTURE Left 05/16/2013   Procedure: OPEN REDUCTION INTERNAL FIXATION (ORIF) LEFT CALCANEOUS FRACTURE;  Surgeon: Budd Palmer, MD;  Location: [facility] OR;  Service: Orthopedics;  Laterality: Left;   TOTAL ABDOMINAL HYSTERECTOMY     1986   WISDOM TOOTH EXTRACTION     Past Surgical History:  Procedure Laterality Date   CHOLECYSTECTOMY     2008   ORIF CALCANEOUS FRACTURE Left 05/16/2013   Procedure: OPEN REDUCTION INTERNAL FIXATION (ORIF) LEFT CALCANEOUS FRACTURE;  Surgeon: Budd Palmer, MD;  Location: [facility] OR;  Service: Orthopedics;  Laterality: Left;   TOTAL ABDOMINAL HYSTERECTOMY     1986   WISDOM TOOTH EXTRACTION     Past Medical History:  Diagnosis Date   Acid reflux disease    Anginal pain (HCC)    last time 04/18/13 in  afternoon; referred to Dr. 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   Chest pain 04/07/2011   Chronic pain    Chronic pain 06/19/2014   Cough 09/25/2011   Depression    Fast heart beat    Fibromyalgia    Headache(784.0) 12/27/2012   History of blood transfusion 1981   childbirth   History of kidney stones    passed   Hypertension    Influenza A 07/08/2017   Left wrist pain 03/10/2013   Migraine    last one was 04/18/13   Sleep apnea    does not use CPAP   Trichomonas contact, treated    BP 117/78   Pulse 92   Ht 5' (1.524 m)   Wt 157 lb (71.2 kg)   SpO2 95%   BMI 30.66 kg/m   Opioid Risk Score:   Fall Risk Score:  `1  Depression screen [facility] 2/9     03/17/2023    9:11 AM 02/02/2023    9:37 AM 12/17/2022    1:23 PM 12/16/2022    8:45 AM 10/26/2022    2:54 PM 08/28/2022    9:37 AM 08/03/2022    9:28 AM  Depression screen PHQ 2/9  Decreased Interest 0 0 0 0 0 0 0  Down, Depressed, Hopeless 0 0 0 0 0 0 0  PHQ - 2 Score 0 0 0 0 0 0 0      Review of Systems  Musculoskeletal:  Positive for back pain and gait problem.       B/L shoulder, arm. Leg pain  All other systems reviewed and are negative.      Objective:   Physical Exam Vitals and nursing note reviewed.  Constitutional:      Appearance: Normal appearance.  Cardiovascular:     Rate and Rhythm: Normal rate and regular rhythm.     Pulses: Normal pulses.     Heart sounds: Normal heart sounds.  Pulmonary:     Effort: Pulmonary effort is normal.     Breath sounds: Normal breath sounds.  Musculoskeletal:     Comments: Normal Muscle Bulk and Muscle Testing Reveals:  Upper Extremities:Full  ROM and Muscle Strength 5/5  Thoracic Paraspinal Tenderness: T-2- T-10 Lumbar Paraspinal Tenderness: L-4-L-5 Lower Extremities: Full ROM and Muscle Strength 5/5 Arises from Chair slowly Narrow Based Gait     Skin:    General: Skin  is warm and dry.  Neurological:     Mental Status: She is alert  and oriented to person, place, and time.  Psychiatric:        Mood and Affect: Mood normal.        Behavior: Behavior normal.         Assessment & Plan:  Fibromyalgia: Continue HEP as Tolerated. Continue to Monitor. 05/19/2023 Myofascial Pain Syndrome: Continue Flexeril. Continue to Monitor. 05/19/2023 Chronic Pain Syndrome: Refilled: Oxycodone 5/325 mg one tablet twice a day as needed for pain.  We will continue the opioid monitoring program, this consists of regular clinic visits, examinations, urine drug screen, pill counts as well as use of [place] Controlled Substance Reporting system. A 12 month History has been reviewed on the [place] Controlled Substance Reporting System on 03/17/2023.   4. Lumbar Facet Arthropathy: Continue HEP as Tolerated. Continue current medication regimen. Continue to monitor. 05/19/2023 5. Bilateral Thoracic Back Pain: Continue HEP as Tolerated. Continue to Monitor. 05/19/2023 6. Fatigue: RX: Vitamin D Level. Awaiting results. Continue to monitor.     F/U in 1 month with Dr Carlis Abbott

## 2023-06-08 ENCOUNTER — Other Ambulatory Visit: Payer: Self-pay | Admitting: Internal Medicine

## 2023-06-08 ENCOUNTER — Other Ambulatory Visit (HOSPITAL_COMMUNITY): Payer: Self-pay

## 2023-06-08 ENCOUNTER — Other Ambulatory Visit: Payer: Self-pay | Admitting: Physical Medicine and Rehabilitation

## 2023-06-08 DIAGNOSIS — M797 Fibromyalgia: Secondary | ICD-10-CM

## 2023-06-10 ENCOUNTER — Other Ambulatory Visit (HOSPITAL_COMMUNITY): Payer: Self-pay

## 2023-06-10 ENCOUNTER — Ambulatory Visit (INDEPENDENT_AMBULATORY_CARE_PROVIDER_SITE_OTHER): Payer: Self-pay

## 2023-06-10 ENCOUNTER — Ambulatory Visit
Admission: EM | Admit: 2023-06-10 | Discharge: 2023-06-10 | Disposition: A | Discharge status: Home / Self Care | Payer: Self-pay | Attending: Family Medicine | Admitting: Family Medicine

## 2023-06-10 DIAGNOSIS — J189 Pneumonia, unspecified organism: Secondary | ICD-10-CM | POA: Insufficient documentation

## 2023-06-10 DIAGNOSIS — J029 Acute pharyngitis, unspecified: Secondary | ICD-10-CM | POA: Insufficient documentation

## 2023-06-10 DIAGNOSIS — R051 Acute cough: Secondary | ICD-10-CM | POA: Insufficient documentation

## 2023-06-10 DIAGNOSIS — Z2089 Contact with and (suspected) exposure to other communicable diseases: Secondary | ICD-10-CM | POA: Insufficient documentation

## 2023-06-10 LAB — POCT RAPID STREP A (OFFICE): Rapid Strep A Screen: NEGATIVE

## 2023-06-10 LAB — POCT INFLUENZA A/B
Influenza A, POC: NEGATIVE
Influenza B, POC: NEGATIVE

## 2023-06-10 MED ORDER — ALBUTEROL SULFATE HFA 108 (90 BASE) MCG/ACT IN AERS
1.0000 | INHALATION_SPRAY | Freq: Four times a day (QID) | RESPIRATORY_TRACT | 0 refills | Status: DC | PRN
  Filled 2023-06-10: qty 6.7, 30d supply, fill #0

## 2023-06-10 MED ORDER — AMOXICILLIN-POT CLAVULANATE 875-125 MG PO TABS
1.0000 | ORAL_TABLET | Freq: Two times a day (BID) | ORAL | 0 refills | Status: AC
  Filled 2023-06-10: qty 14, 7d supply, fill #0

## 2023-06-10 MED ORDER — BENZONATATE 200 MG PO CAPS
200.0000 mg | ORAL_CAPSULE | Freq: Three times a day (TID) | ORAL | 0 refills | Status: DC | PRN
  Filled 2023-06-10: qty 20, 7d supply, fill #0

## 2023-06-10 MED ORDER — VITAMIN D (ERGOCALCIFEROL) 1.25 MG (50000 UNIT) PO CAPS
50000.0000 [IU] | ORAL_CAPSULE | ORAL | 0 refills | Status: DC
  Filled 2023-06-10: qty 7, 49d supply, fill #0

## 2023-06-10 MED ORDER — AZITHROMYCIN 250 MG PO TABS
250.0000 mg | ORAL_TABLET | Freq: Every day | ORAL | 0 refills | Status: DC
  Filled 2023-06-10: qty 6, 5d supply, fill #0

## 2023-06-10 NOTE — ED Triage Notes (Signed)
 Pt presents to UC w/ c/o vomiting,sore throat, headache, body aches yesterday. Exposure to pneumonia, rsv. Pt took hydrocodone for pain

## 2023-06-10 NOTE — ED Provider Notes (Addendum)
 [facility]    CSN: [id number] Arrival date & time: 06/10/23  9167      History   Chief Complaint No chief complaint on file.   HPI Sherry [name] is a 63 y.o. female  presents for evaluation of URI symptoms for 1 days. Patient reports associated symptoms of dry cough, sore throat, headaches, body aches, tactile fevers, vomiting. Denies diarrhea, ear pain, shortness of breath.  Patient does not have a hx of asthma. Patient is not an active smoker.   Reports father is RSV as well as pneumonia.  Pt has taken nothing OTC for symptoms.  She has taken a prescription of hydrocodone  for her body aches.  Pt has no other concerns at this time.   HPI  Past Medical History:  Diagnosis Date   Acid reflux disease    Anginal pain (HCC)    last time 04/18/13 in afternoon; referred to Dr. 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   Chest pain 04/07/2011   Chronic pain    Chronic pain 06/19/2014   Cough 09/25/2011   Depression    Fast heart beat    Fibromyalgia    Headache(784.0) 12/27/2012   History of blood transfusion 1981   childbirth   History of kidney stones    passed   Hypertension    Influenza A 07/08/2017   Left wrist pain 03/10/2013   Migraine    last one was 04/18/13   Sleep apnea    does not use CPAP   Trichomonas contact, treated     Patient Active Problem List   Diagnosis Date Noted   Acute pain of left shoulder 12/18/2022   Mixed hyperlipidemia 03/05/2022   Osteoarthritis of multiple joints 08/25/2021   Multiple thyroid  nodules 08/20/2020   Type 2 diabetes mellitus not at goal [facility]) 04/22/2020   Healthcare maintenance 01/26/2019   Insomnia 12/29/2018   Solitary pulmonary nodule 08/03/2018   Depression 05/09/2018   Sleep apnea 05/18/2013   Lumbar facet arthropathy 03/10/2013   Metabolic syndrome 09/10/2011   Obesity 09/02/2011   Atypical chest pain 04/07/2011   Fibromyalgia 03/24/2011   Anxiety 03/24/2011   Essential  hypertension 03/24/2011   GERD 01/05/2007    Past Surgical History:  Procedure Laterality Date   CHOLECYSTECTOMY     2008   ORIF CALCANEOUS FRACTURE Left 05/16/2013   Procedure: OPEN REDUCTION INTERNAL FIXATION (ORIF) LEFT CALCANEOUS FRACTURE;  Surgeon: Ozell VEAR Bruch, MD;  Location: [facility] OR;  Service: Orthopedics;  Laterality: Left;   TOTAL ABDOMINAL HYSTERECTOMY     1986   WISDOM TOOTH EXTRACTION      OB History     Gravida  3   Para  2   Term  2   Preterm      AB  1   Living  2      SAB  1   IAB      Ectopic      Multiple      Live Births  2            Home Medications    Prior to Admission medications   Medication Sig Start Date End Date Taking? Authorizing Provider  albuterol  (VENTOLIN  HFA) 108 (90 Base) MCG/ACT inhaler Inhale 1-2 puffs into the lungs every 6 (six) hours as needed. 06/10/23  Yes Everline Mahaffy, Jodi R, NP  amoxicillin -clavulanate (AUGMENTIN ) 875-125 MG tablet Take 1 tablet by mouth every 12 (twelve) hours. 06/10/23  Yes Loreda Myla SAUNDERS, NP  azithromycin  (ZITHROMAX ) 250 MG tablet Take first 2 tablets together on day 1, then 1 tablet every day until finished. 06/10/23  Yes Amarise Lillo, Jodi R, NP  benzonatate  (TESSALON ) 200 MG capsule Take 1 capsule (200 mg total) by mouth 3 (three) times daily as needed. 06/10/23  Yes Loreda Myla SAUNDERS, NP  aspirin  EC 81 MG tablet Take 81 mg by mouth daily. Swallow whole.    [provider]  aspirin -acetaminophen -caffeine (EXCEDRIN MIGRAINE) 250-250-65 MG tablet Take 2 tablets by mouth every 6 (six) hours as needed for headache.    [provider]  Blood Glucose Monitoring Suppl DEVI 1 each by Does not apply route in the morning, at noon, and at bedtime. May substitute to any manufacturer covered by patient's insurance. 04/28/23   Gregary Sharper, MD  cetirizine  (ZYRTEC  ALLERGY) 10 MG tablet Take 1 tablet (10 mg total) by mouth daily. 10/19/22   Rising, Asberry, PA-C  cyclobenzaprine  (FLEXERIL ) 10 MG tablet Take 1  tablet (10 mg total) by mouth 2 (two) times daily as needed. 04/22/23   Debby Fidela CROME, NP  diclofenac  Sodium (VOLTAREN  ARTHRITIS PAIN) 1 % GEL Apply 2 g topically 4 (four) times daily. 12/17/22   Zheng, Michael, DO  diclofenac  Sodium (VOLTAREN ) 1 % GEL Apply 2 grams topically 4 (four) times daily. 04/22/23   Addie Perkins, DO  docusate sodium  (COLACE) 100 MG capsule Take 1 capsule (100 mg total) by mouth every 12 (twelve) hours. 07/24/22   Addie Perkins, DO  Dulaglutide  (TRULICITY ) 1.5 MG/0.5ML SOAJ Inject 1.5 mg into the skin once a week. 10/26/22 10/26/23  Tobie Gaines, DO  empagliflozin  (JARDIANCE ) 10 MG TABS tablet Take 1 tablet (10 mg total) by mouth daily before breakfast. 04/30/23   Norrine Sharper, MD  hydrOXYzine  (ATARAX ) 10 MG tablet Take 1 tablet (10 mg total) by mouth 3 (three) times daily as needed. 04/16/23   Nooruddin, Saad, MD  lisinopril -hydrochlorothiazide  (ZESTORETIC ) 20-25 MG tablet Take 1 tablet by mouth daily. 10/26/22 10/26/23  Tobie Gaines, DO  meloxicam  (MOBIC ) 15 MG tablet Take 1 tablet (15 mg total) by mouth daily. 11/27/22   Christopher Savannah, PA-C  oxyCODONE -acetaminophen  (PERCOCET) 5-325 MG tablet Take 1 tablet by mouth 2 (two) times daily as needed for severe pain. 05/19/23   Debby Fidela CROME, NP  pantoprazole  (PROTONIX ) 40 MG tablet Take 1 tablet (40 mg total) by mouth 2 (two) times daily. 03/19/23   Addie Perkins, DO  polyethylene glycol powder (MIRALAX ) 17 GM/SCOOP powder Take 1 capful (17 g total) and mix in 8 oz of clear liquid and drink by mouth daily as needed for moderate constipation or severe constipation. 07/24/22   Addie Perkins, DO  promethazine  (PHENERGAN ) 12.5 MG tablet Take 1 tablet (12.5 mg total) by mouth every 8 (eight) hours as needed for nausea or vomiting. 03/19/23 04/06/23  Addie Perkins, DO  promethazine  (PHENERGAN ) 12.5 MG tablet Take 1 tablet (12.5 mg total) by mouth every 8 (eight) hours as needed for nausea or vomiting. 04/28/23   Gregary Sharper, MD  simvastatin   (ZOCOR ) 40 MG tablet Take 1 tablet (40 mg total) by mouth at bedtime. 03/19/23   Addie Perkins, DO  Vitamin D , Ergocalciferol , (DRISDOL ) 1.25 MG (50000 UNIT) CAPS capsule Take 1 capsule (50,000 Units total) by mouth every 7 (seven) days. 06/10/23   Raulkar, Sven SQUIBB, MD    Family History Family History  Problem Relation Age of Onset   Diabetes Mother    Hypertension Mother  Depression Mother    Diabetes Sister    Hypertension Father    Cancer Father        Prostate   Alcohol abuse Father    Cancer Brother        Prostate   Breast cancer Neg Hx    Colon cancer Neg Hx    Colon polyps Neg Hx    Esophageal cancer Neg Hx    Stomach cancer Neg Hx    Rectal cancer Neg Hx     Social History Social History   Tobacco Use   Smoking status: Never   Smokeless tobacco: Never  Vaping Use   Vaping status: Never Used  Substance Use Topics   Alcohol use: No   Drug use: No     Allergies   Duloxetine , Duloxetine  hcl, Pregabalin, and Gabapentin    Review of Systems Review of Systems  Constitutional:  Positive for fever.  HENT:  Positive for congestion and sore throat.   Respiratory:  Positive for cough.   Gastrointestinal:  Positive for vomiting.  Musculoskeletal:  Positive for myalgias.     Physical Exam Triage Vital Signs ED Triage Vitals [06/10/23 0917]  Encounter Vitals Group     BP 131/79     Systolic BP Percentile      Diastolic BP Percentile      Pulse Rate (!) 117     Resp 18     Temp 99.9 F (37.7 C)     Temp Source Oral     SpO2 92 %     Weight      Height      Head Circumference      Peak Flow      Pain Score 9     Pain Loc      Pain Education      Exclude from Growth Chart    No data found.  Updated Vital Signs BP 131/79 (BP Location: Right Arm)   Pulse (!) 117   Temp 99.9 F (37.7 C) (Oral)   Resp 18   SpO2 92%   Visual Acuity Right Eye Distance:   Left Eye Distance:   Bilateral Distance:    Right Eye Near:   Left Eye Near:     Bilateral Near:     Physical Exam Vitals and nursing note reviewed.  Constitutional:      General: She is not in acute distress.    Appearance: She is well-developed. She is not ill-appearing.  HENT:     Head: Normocephalic and atraumatic.     Right Ear: Tympanic membrane and ear canal normal.     Left Ear: Tympanic membrane and ear canal normal.     Nose: Congestion present.     Mouth/Throat:     Mouth: Mucous membranes are moist.     Pharynx: Oropharynx is clear. Uvula midline. Posterior oropharyngeal erythema present.     Tonsils: No tonsillar exudate or tonsillar abscesses.  Eyes:     Conjunctiva/sclera: Conjunctivae normal.     Pupils: Pupils are equal, round, and reactive to light.  Cardiovascular:     Rate and Rhythm: Normal rate and regular rhythm.     Heart sounds: Normal heart sounds.  Pulmonary:     Effort: Pulmonary effort is normal.     Breath sounds: Normal breath sounds. No wheezing.  Musculoskeletal:     Cervical back: Normal range of motion and neck supple.  Lymphadenopathy:     Cervical: No cervical adenopathy.  Skin:  General: Skin is warm and dry.  Neurological:     General: No focal deficit present.     Mental Status: She is alert and oriented to person, place, and time.  Psychiatric:        Mood and Affect: Mood normal.        Behavior: Behavior normal.      UC Treatments / Results  Labs (all labs ordered are listed, but only abnormal results are displayed) Labs Reviewed  SARS CORONAVIRUS 2 (TAT 6-24 HRS)  CULTURE, GROUP A STREP [facility])  POCT RAPID STREP A (OFFICE)  POCT INFLUENZA A/B   Basic metabolic panel Order: [id number] Component Ref Range & Units 1 yr ago  Sodium 136 - 145 mmol/L 138  Potassium 3.4 - 4.9 mmol/L 3.3 Low   Chloride 98 - 107 mmol/L 98  CO2 21 - 32 mmol/L 31  BUN 7 - 25 mg/dL 11  Creatinine 9.39 - 8.69 mg/dL 9.05  Glucose, Random 74 - 109 mg/dL 867 High   Calcium 8.6 - 10.2 mg/dL 9.5  eGFR >39.9  fO/fpw/8.26f*7 >60.0  Comment: Calculation based on the Chronic Kidney Disease Epidemiology Collaboration (CKD-EPIC) equation refit without adjustment for race.  Anion Gap 1 - 11 mmol/L 9  Resulting Agency [facility]   Specimen Collected: 05/30/22 08:25   Performed by: [facility] Last Resulted: 05/30/22 08:52  Received From: [facility]  Result Received: 06/11/22 11:49    EKG   Radiology DG Chest 2 View Result Date: 06/10/2023 CLINICAL DATA:  Pneumonia. EXAM: CHEST - 2 VIEW COMPARISON:  X-ray 05/10/2020.  CT as well FINDINGS: No pneumothorax, effusion or edema. Normal cardiopericardial silhouette. There is some basilar linear changes identified, possibly atelectasis or scar. There is a more ill-defined mild opacity along the right upper lobe. An acute infiltrates possible. Recommend follow-up. Surgical clips in the right upper quadrant. IMPRESSION: Ill-defined hazy opacity right upper lobe. Infiltrate or pneumonia is possible. There is also basilar atelectasis separately. Recommend follow up after treatment to confirm resolution Electronically Signed   By: Ranell Bring M.D.   On: 06/10/2023 10:45    Procedures Procedures (including critical care time)  Medications Ordered in UC Medications - No data to display  Initial Impression / Assessment and Plan / UC Course  I have reviewed the triage vital signs and the nursing notes.  Pertinent labs & imaging results that were available during my care of the patient were reviewed by me and considered in my medical decision making (see chart for details).     Reviewed exam and symptoms with patient.  Negative rapid strep and flu, will send strep throat culture.  COVID PCR and will contact if positive.  Wet read of x-ray without obvious consolidation but patient presenting with fever cough and exposure to PNA.  Will start azithromycin .  Tessalon  as needed for cough.  Albuterol  inhaler as needed.  I salt water  gargles warm liquids and OTC analgesics as needed.  PCP follow-up 2 days for recheck.  ER precautions reviewed.  Addendum 1103: Radiology overread shows possible right upper lobe infiltrate.  Patient is already on azithromycin , will add in Augmentin .  Labs recently reviewed.  Contacted patient who verified DOB.  Reviewed these results with her.  Augmentin  sent to pharmacy.  Advised her to follow-up with PCP in 4 weeks for repeat chest x-ray.  ER precautions were again reviewed with patient.  All questions answered. Final Clinical Impressions(s) / UC Diagnoses   Final diagnoses:  Acute cough  Sore throat  Exposure to pneumonia  Pneumonia of right upper lobe due to infectious organism     Discharge Instructions      The clinic will contact you with results of the COVID test done today if positive.  Given your exposure to pneumonia you can start azithromycin .  Tessalon  as needed for cough.  Albuterol  inhaler as needed for shortness of breath.  Lots of rest and fluids.  You may do Tylenol  or ibuprofen  OTC as needed for fever.  You should also do salt water gargles and warm liquids such as teas and honey to help with your sore throat.  Please follow-up with your PCP in 2 days for recheck.  Please go to the ER if you develop any worsening symptoms.  I hope you feel better soon!     ED Prescriptions     Medication Sig Dispense Auth. Provider   azithromycin  (ZITHROMAX ) 250 MG tablet Take first 2 tablets together on day 1, then 1 tablet every day until finished. 6 tablet Daray Polgar, Jodi R, NP   benzonatate  (TESSALON ) 200 MG capsule Take 1 capsule (200 mg total) by mouth 3 (three) times daily as needed. 20 capsule Matson Welch, Jodi R, NP   albuterol  (VENTOLIN  HFA) 108 (90 Base) MCG/ACT inhaler Inhale 1-2 puffs into the lungs every 6 (six) hours as needed. 6.7 g Addylynn Balin, Jodi R, NP   amoxicillin -clavulanate (AUGMENTIN ) 875-125 MG tablet Take 1 tablet by mouth every 12 (twelve) hours. 14 tablet Kellina Dreese, Jodi R, NP       PDMP not reviewed this encounter.   Loreda Myla SAUNDERS, NP 06/10/23 1025    Loreda Myla SAUNDERS, NP 06/10/23 1106

## 2023-06-10 NOTE — Discharge Instructions (Signed)
 The clinic will contact you with results of the COVID test done today if positive.  Given your exposure to pneumonia you can start azithromycin .  Tessalon  as needed for cough.  Albuterol  inhaler as needed for shortness of breath.  Lots of rest and fluids.  You may do Tylenol  or ibuprofen  OTC as needed for fever.  You should also do salt water gargles and warm liquids such as teas and honey to help with your sore throat.  Please follow-up with your PCP in 2 days for recheck.  Please go to the ER if you develop any worsening symptoms.  I hope you feel better soon!

## 2023-06-11 ENCOUNTER — Other Ambulatory Visit (HOSPITAL_COMMUNITY): Payer: Self-pay

## 2023-06-11 LAB — SARS CORONAVIRUS 2 (TAT 6-24 HRS): SARS Coronavirus 2: NEGATIVE

## 2023-06-11 MED ORDER — DICLOFENAC SODIUM 1 % EX GEL
2.0000 g | Freq: Four times a day (QID) | CUTANEOUS | 0 refills | Status: DC
  Filled 2023-06-11 – 2023-08-17 (×2): qty 100, 12d supply, fill #0

## 2023-06-13 LAB — CULTURE, GROUP A STREP (THRC)

## 2023-06-21 ENCOUNTER — Other Ambulatory Visit (HOSPITAL_COMMUNITY): Payer: Self-pay

## 2023-06-23 ENCOUNTER — Encounter: Payer: No Typology Code available for payment source | Admitting: Registered Nurse

## 2023-07-05 ENCOUNTER — Other Ambulatory Visit (HOSPITAL_COMMUNITY): Payer: Self-pay

## 2023-07-05 ENCOUNTER — Encounter: Payer: Self-pay | Admitting: Registered Nurse

## 2023-07-05 ENCOUNTER — Encounter
Payer: No Typology Code available for payment source | Attending: Physical Medicine and Rehabilitation | Admitting: Registered Nurse

## 2023-07-05 VITALS — BP 138/86 | HR 97 | Ht 60.0 in | Wt 163.4 lb

## 2023-07-05 DIAGNOSIS — M25511 Pain in right shoulder: Secondary | ICD-10-CM | POA: Insufficient documentation

## 2023-07-05 DIAGNOSIS — G8929 Other chronic pain: Secondary | ICD-10-CM | POA: Insufficient documentation

## 2023-07-05 DIAGNOSIS — Z79891 Long term (current) use of opiate analgesic: Secondary | ICD-10-CM | POA: Insufficient documentation

## 2023-07-05 DIAGNOSIS — M546 Pain in thoracic spine: Secondary | ICD-10-CM | POA: Insufficient documentation

## 2023-07-05 DIAGNOSIS — M47816 Spondylosis without myelopathy or radiculopathy, lumbar region: Secondary | ICD-10-CM | POA: Diagnosis present

## 2023-07-05 DIAGNOSIS — M545 Low back pain, unspecified: Secondary | ICD-10-CM | POA: Diagnosis present

## 2023-07-05 DIAGNOSIS — M797 Fibromyalgia: Secondary | ICD-10-CM | POA: Insufficient documentation

## 2023-07-05 DIAGNOSIS — Z5181 Encounter for therapeutic drug level monitoring: Secondary | ICD-10-CM | POA: Insufficient documentation

## 2023-07-05 DIAGNOSIS — G894 Chronic pain syndrome: Secondary | ICD-10-CM | POA: Diagnosis present

## 2023-07-05 MED ORDER — OXYCODONE-ACETAMINOPHEN 5-325 MG PO TABS
1.0000 | ORAL_TABLET | Freq: Two times a day (BID) | ORAL | 0 refills | Status: DC | PRN
  Filled 2023-07-05 – 2023-07-06 (×2): qty 60, 30d supply, fill #0

## 2023-07-05 NOTE — Progress Notes (Signed)
Subjective:    Patient ID: Sherry [name], female    DOB: 20-Feb-1961, 63 y.o.   MRN: [id number]  HPI: Sherry [name] is a 63 y.o. female who returns for follow up appointment for chronic pain and medication refill. She states her  pain is located in her bilateral shoulders and upper-lower back. She rates her pain 10. Her current exercise regime is walking.  [name] Morphine equivalent is 15.00 MME.   Oral Swab was Performed today.    Pain Inventory Average Pain 10 Pain Right Now 10 My pain is constant and aching  In the last 24 hours, has pain interfered with the following? General activity 5 Relation with others 5 Enjoyment of life 5 What TIME of day is your pain at its worst? morning , daytime, evening, and night Sleep (in general) Poor  Pain is worse with: unsure and weather Pain improves with: rest, heat/ice, and medication Relief from Meds: 1  Family History  Problem Relation Age of Onset   Diabetes Mother    Hypertension Mother    Depression Mother    Diabetes Sister    Hypertension Father    Cancer Father        Prostate   Alcohol abuse Father    Cancer Brother        Prostate   Breast cancer Neg Hx    Colon cancer Neg Hx    Colon polyps Neg Hx    Esophageal cancer Neg Hx    Stomach cancer Neg Hx    Rectal cancer Neg Hx    Social History   Socioeconomic History   Marital status: Single    Spouse name: Not on file   Number of children: 2   Years of education: Not on file   Highest education level: 12th grade  Occupational History   Occupation: Unemployed  Tobacco Use   Smoking status: Never   Smokeless tobacco: Never  Vaping Use   Vaping status: Never Used  Substance and Sexual Activity   Alcohol use: No   Drug use: No   Sexual activity: Not on file  Other Topics Concern   Not on file  Social History Narrative   Lives with   2 Children: Ages 61 and 51   4 Grandchildren: 2, 3, 4 and 91   Brother      Diet: "Barely eats". Does not  exercise due to pain.      03/2011- Currently unemployed. 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 Unsure of last pap.   Unsure of last Td   Unsure of last flu shot   Social Drivers of Corporate investment banker Strain: Not on file  Food Insecurity: No Food Insecurity (09/11/2021)   Hunger Vital Sign    Worried About Running Out of Food in the Last Year: Never true    Ran Out of Food in the Last Year: Never true  Transportation Needs: No Transportation Needs (09/11/2021)   PRAPARE - Administrator, Civil Service (Medical): No    Lack of Transportation (Non-Medical): No  Physical Activity: Not on file  Stress: Not on file  Social Connections: Unknown (10/20/2021)   Received from [facility]   Social Network    Social Network: Not on file   Past Surgical History:  Procedure Laterality Date   CHOLECYSTECTOMY     2008   ORIF CALCANEOUS  FRACTURE Left 05/16/2013   Procedure: OPEN REDUCTION INTERNAL FIXATION (ORIF) LEFT CALCANEOUS FRACTURE;  Surgeon: Budd Palmer, MD;  Location: [facility] OR;  Service: Orthopedics;  Laterality: Left;   TOTAL ABDOMINAL HYSTERECTOMY     1986   WISDOM TOOTH EXTRACTION     Past Surgical History:  Procedure Laterality Date   CHOLECYSTECTOMY     2008   ORIF CALCANEOUS FRACTURE Left 05/16/2013   Procedure: OPEN REDUCTION INTERNAL FIXATION (ORIF) LEFT CALCANEOUS FRACTURE;  Surgeon: Budd Palmer, MD;  Location: [facility] OR;  Service: Orthopedics;  Laterality: Left;   TOTAL ABDOMINAL HYSTERECTOMY     1986   WISDOM TOOTH EXTRACTION     Past Medical History:  Diagnosis Date   Acid reflux disease    Anginal pain (HCC)    last time 04/18/13 in afternoon; referred to Dr. Dietrich Pates   Anxiety    Cancer [facility])    uterine- had hysterectomy   Chest pain 04/07/2011   Chronic pain    Chronic pain 06/19/2014   Cough 09/25/2011   Depression    Fast heart beat    Fibromyalgia     Headache(784.0) 12/27/2012   History of blood transfusion 1981   childbirth   History of kidney stones    passed   Hypertension    Influenza A 07/08/2017   Left wrist pain 03/10/2013   Migraine    last one was 04/18/13   Sleep apnea    does not use CPAP   Trichomonas contact, treated    BP 138/86   Pulse 97   Ht 5' (1.524 m)   Wt 163 lb 6.4 oz (74.1 kg)   SpO2 96%   BMI 31.91 kg/m   Opioid Risk Score:   Fall Risk Score:  `1  Depression screen [facility] 2/9     03/17/2023    9:11 AM 02/02/2023    9:37 AM 12/17/2022    1:23 PM 12/16/2022    8:45 AM 10/26/2022    2:54 PM 08/28/2022    9:37 AM 08/03/2022    9:28 AM  Depression screen PHQ 2/9  Decreased Interest 0 0 0 0 0 0 0  Down, Depressed, Hopeless 0 0 0 0 0 0 0  PHQ - 2 Score 0 0 0 0 0 0 0      Review of Systems  Musculoskeletal:  Positive for back pain.       Upper and lower back, both arms and legs  All other systems reviewed and are negative.      Objective:   Physical Exam Vitals and nursing note reviewed.  Constitutional:      Appearance: Normal appearance.  Cardiovascular:     Rate and Rhythm: Normal rate and regular rhythm.     Pulses: Normal pulses.     Heart sounds: Normal heart sounds.  Pulmonary:     Effort: Pulmonary effort is normal.     Breath sounds: Normal breath sounds.  Musculoskeletal:     Comments: Normal Muscle Bulk and Muscle Testing Reveals:  Upper Extremities: Right: Decreased ROM 90 Degrees  and Muscle Strength 5/5 Left Upper Extremity: Full ROM and Muscle Strength 5/5 Thoracic Paraspinal Tenderness: T-7-T-9 Lumbar Paraspinal Tenderness: L-4-L-5 Lower Extremities: Full ROM and Muscle Strength 5/5 Arises from Table slowly Narrow Based  Gait     Skin:    General: Skin is warm and dry.  Neurological:     Mental Status: She is alert and oriented to person, place, and time.  Psychiatric:  Mood and Affect: Mood normal.        Behavior: Behavior normal.           Assessment  & Plan:  Fibromyalgia: Continue HEP as Tolerated. Continue to Monitor. 07/05/2023 Myofascial Pain Syndrome: Continue Flexeril. Continue to Monitor. 01/27/20225 Chronic Pain Syndrome: Refilled: Oxycodone 5/325 mg one tablet twice a day as needed for pain.  We will continue the opioid monitoring program, this consists of regular clinic visits, examinations, urine drug screen, pill counts as well as use of [place] Controlled Substance Reporting system. A 12 month History has been reviewed on the [place] Controlled Substance Reporting System on 07/05/2023.   4. Lumbar Facet Arthropathy: Continue HEP as Tolerated. Continue current medication regimen. Continue to monitor. 07/05/2023 5. Bilateral Thoracic Back Pain: Continue HEP as Tolerated. Continue to Monitor. 07/05/2023 6. Fatigue: RX: Ms. Stierwalt was reminded to go to lab to have her Vitamin D Level checked, she verbalizes understanding. Vitamin D Level. Awaiting results. Continue to monitor.  7. Right Shoulder Pain: RX: Shoulder X-ray. Continue to Monitor.   F/U in 1 month      F/U in 1 month with Dr Carlis Abbott

## 2023-07-06 ENCOUNTER — Other Ambulatory Visit: Payer: Self-pay

## 2023-07-06 ENCOUNTER — Other Ambulatory Visit (HOSPITAL_COMMUNITY): Payer: Self-pay

## 2023-07-11 LAB — DRUG TOX MONITOR 1 W/CONF, ORAL FLD
Alprazolam: NEGATIVE ng/mL (ref <0.50)
Aminoclonazepam: 1.86 ng/mL — ABNORMAL HIGH (ref <0.50)
Amphetamines: NEGATIVE ng/mL (ref <10)
Barbiturates: NEGATIVE ng/mL (ref <10)
Benzodiazepines: POSITIVE ng/mL — AB (ref <0.50)
Buprenorphine: NEGATIVE ng/mL (ref <0.10)
Chlordiazepoxide: NEGATIVE ng/mL (ref <0.50)
Clonazepam: 0.54 ng/mL — ABNORMAL HIGH (ref <0.50)
Cocaine: NEGATIVE ng/mL (ref <5.0)
Codeine: NEGATIVE ng/mL (ref <2.5)
Diazepam: NEGATIVE ng/mL (ref <0.50)
Dihydrocodeine: NEGATIVE ng/mL (ref <2.5)
Fentanyl: NEGATIVE ng/mL (ref <0.10)
Flunitrazepam: NEGATIVE ng/mL (ref <0.50)
Flurazepam: NEGATIVE ng/mL (ref <0.50)
Heroin Metabolite: NEGATIVE ng/mL (ref <1.0)
Hydrocodone: NEGATIVE ng/mL (ref <2.5)
Hydromorphone: NEGATIVE ng/mL (ref <2.5)
Lorazepam: NEGATIVE ng/mL (ref <0.50)
MARIJUANA: NEGATIVE ng/mL (ref <2.5)
MDMA: NEGATIVE ng/mL (ref <10)
Meprobamate: NEGATIVE ng/mL (ref <2.5)
Methadone: NEGATIVE ng/mL (ref <5.0)
Midazolam: NEGATIVE ng/mL (ref <0.50)
Morphine: NEGATIVE ng/mL (ref <2.5)
Nicotine Metabolite: NEGATIVE ng/mL (ref <5.0)
Nordiazepam: NEGATIVE ng/mL (ref <0.50)
Norhydrocodone: NEGATIVE ng/mL (ref <2.5)
Noroxycodone: 22.2 ng/mL — ABNORMAL HIGH (ref <2.5)
Opiates: POSITIVE ng/mL — AB (ref <2.5)
Oxazepam: NEGATIVE ng/mL (ref <0.50)
Oxycodone: 38.7 ng/mL — ABNORMAL HIGH (ref <2.5)
Oxymorphone: NEGATIVE ng/mL (ref <2.5)
Phencyclidine: NEGATIVE ng/mL (ref <10)
Tapentadol: NEGATIVE ng/mL (ref <5.0)
Temazepam: NEGATIVE ng/mL (ref <0.50)
Tramadol: NEGATIVE ng/mL (ref <5.0)
Triazolam: NEGATIVE ng/mL (ref <0.50)
Zolpidem: NEGATIVE ng/mL (ref <5.0)

## 2023-07-11 LAB — DRUG TOX ALC METAB W/CON, ORAL FLD: Alcohol Metabolite: NEGATIVE ng/mL (ref <25)

## 2023-07-12 ENCOUNTER — Telehealth: Payer: Self-pay | Admitting: Registered Nurse

## 2023-07-12 NOTE — Telephone Encounter (Signed)
Oral Swab Results was Positive for Clonazepam again. PMP was Reviewed. She is not prescribe clonazepam, she had a formal warning in the past. The above was discussed with Dr Carlis Abbott, she will be discharge from our office. A call was place to Sherry [name] regarding the above and she was given wean down  instructions of her Oxycodone, she verbalizes understanding.

## 2023-07-14 ENCOUNTER — Other Ambulatory Visit (HOSPITAL_COMMUNITY): Payer: Self-pay

## 2023-07-14 ENCOUNTER — Other Ambulatory Visit: Payer: Self-pay | Admitting: Physical Medicine and Rehabilitation

## 2023-07-15 ENCOUNTER — Other Ambulatory Visit (HOSPITAL_COMMUNITY): Payer: Self-pay

## 2023-07-15 ENCOUNTER — Telehealth: Payer: Self-pay

## 2023-07-15 MED ORDER — VITAMIN D (ERGOCALCIFEROL) 1.25 MG (50000 UNIT) PO CAPS
50000.0000 [IU] | ORAL_CAPSULE | ORAL | 0 refills | Status: DC
  Filled 2023-07-15 – 2023-08-17 (×2): qty 7, 49d supply, fill #0

## 2023-07-15 NOTE — Telephone Encounter (Signed)
 Prior Authorization for patient (Trulicity  1.5MG /0.5ML auto-injectors) came through on cover my meds was submitted with last office notes and labs awaiting approval or denial.  KEY:[id number]

## 2023-07-15 NOTE — Telephone Encounter (Addendum)
 Decision:Denied  Sherry [name] (Key: [id number]) PA Case ID #: [id number] Rx #: [id number] Need Help? Call us  at [phone] Outcome Denied today by [employer] HIX 2017 Denied Drug Trulicity  1.5MG /0.5ML auto-injectors ePA cloud logo Form [employer] Commercial / Arts Administrator Form  We denied the medical services/items listed above because: Glucagon-like-peptide-1 receptor agonist (GLP-1) Step therapy criteria has not been met. To meet out criteria:  you have tried and failed metformin  at the maximally tolerated dose for a minimum of 3 months

## 2023-07-19 ENCOUNTER — Encounter: Payer: Self-pay | Admitting: Student

## 2023-07-19 ENCOUNTER — Other Ambulatory Visit (HOSPITAL_COMMUNITY): Payer: Self-pay

## 2023-07-19 ENCOUNTER — Telehealth: Payer: Self-pay | Admitting: Internal Medicine

## 2023-07-19 ENCOUNTER — Ambulatory Visit: Payer: No Typology Code available for payment source | Admitting: Student

## 2023-07-19 VITALS — BP 146/90 | HR 93 | Temp 98.1°F | Wt 161.9 lb

## 2023-07-19 DIAGNOSIS — G894 Chronic pain syndrome: Secondary | ICD-10-CM | POA: Diagnosis not present

## 2023-07-19 DIAGNOSIS — E669 Obesity, unspecified: Secondary | ICD-10-CM

## 2023-07-19 DIAGNOSIS — M797 Fibromyalgia: Secondary | ICD-10-CM

## 2023-07-19 DIAGNOSIS — E119 Type 2 diabetes mellitus without complications: Secondary | ICD-10-CM

## 2023-07-19 DIAGNOSIS — Z6831 Body mass index (BMI) 31.0-31.9, adult: Secondary | ICD-10-CM

## 2023-07-19 DIAGNOSIS — Z6833 Body mass index (BMI) 33.0-33.9, adult: Secondary | ICD-10-CM

## 2023-07-19 DIAGNOSIS — I10 Essential (primary) hypertension: Secondary | ICD-10-CM

## 2023-07-19 DIAGNOSIS — Z7985 Long-term (current) use of injectable non-insulin antidiabetic drugs: Secondary | ICD-10-CM

## 2023-07-19 LAB — POCT GLYCOSYLATED HEMOGLOBIN (HGB A1C): Hemoglobin A1C: 6.8 % — AB (ref 4.0–5.6)

## 2023-07-19 LAB — GLUCOSE, CAPILLARY: Glucose-Capillary: 100 mg/dL — ABNORMAL HIGH (ref 70–99)

## 2023-07-19 MED ORDER — WEGOVY 0.25 MG/0.5ML ~~LOC~~ SOAJ
0.2500 mg | SUBCUTANEOUS | 0 refills | Status: DC
  Filled 2023-07-19: qty 2, 28d supply, fill #0

## 2023-07-19 NOTE — Telephone Encounter (Signed)
 This pt's last mammogram on file is as follows:  MM 3D SCREEN BREAST BILATERAL (Accession [id number]) (Order [id number]) Imaging Date: 04/16/2022 Department: [facility]    She is now overdue.  Would you mind placing a new order for this pt? We will be glad to get her sch.

## 2023-07-19 NOTE — Assessment & Plan Note (Addendum)
 She has fibromyalgia and chronic pain syndrome, for which she was previously seeing a pain management doctor.  She is requesting referral to another pain management clinic, so I have placed one.

## 2023-07-19 NOTE — Progress Notes (Signed)
 CC: diabetes f/u  HPI:  Sherry [name] is a 63 y.o. female living with a history stated below and presents today for diabetes f/u. Please see problem based assessment and plan for additional details.  Past Medical History:  Diagnosis Date   Acid reflux disease    Anginal pain (HCC)    last time 04/18/13 in afternoon; referred to Dr. 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 Swallow whole.     aspirin -acetaminophen -caffeine (EXCEDRIN MIGRAINE) 250-250-65 MG tablet Take 2 tablets by mouth every 6 (six) hours as needed for headache.     Blood Glucose Monitoring Suppl DEVI 1 each by Does not apply route in the morning, at noon, and at bedtime. May substitute to any manufacturer covered by patient's insurance. 1 each 0   cetirizine  (ZYRTEC  ALLERGY) 10 MG tablet Take 1 tablet (10 mg total) by mouth daily. 30 tablet 2   cyclobenzaprine  (FLEXERIL ) 10 MG tablet Take 1 tablet (10 mg total) by mouth 2 (two) times daily as needed. 40 tablet 2   diclofenac  Sodium (VOLTAREN  ARTHRITIS PAIN) 1 % GEL Apply 2 g topically 4 (four) times daily. 50 g 1   diclofenac  Sodium (VOLTAREN ) 1 % GEL Apply 2 grams topically 4 (four) times daily. 100 g 0   docusate sodium  (COLACE) 100 MG capsule Take 1 capsule (100 mg total) by mouth every 12 (twelve) hours. 60 capsule 0   empagliflozin  (JARDIANCE ) 10 MG TABS tablet  Take 1 tablet (10 mg total) by mouth daily before breakfast. (Patient not taking: Reported on 07/05/2023) 30 tablet 3   hydrOXYzine  (ATARAX ) 10 MG tablet Take 1 tablet (10 mg total) by mouth 3 (three) times daily as needed. (Patient not taking: Reported on 07/05/2023) 30 tablet 0   lisinopril -hydrochlorothiazide  (ZESTORETIC ) 20-25 MG tablet Take 1 tablet by mouth daily. 30 tablet 11   meloxicam  (MOBIC ) 15 MG tablet Take 1 tablet (15 mg total) by mouth daily. (Patient not taking: Reported on 07/05/2023) 30 tablet 0   oxyCODONE -acetaminophen  (PERCOCET) 5-325 MG tablet Take 1 tablet by mouth 2 (two) times daily as needed for severe pain. 60 tablet 0   pantoprazole  (PROTONIX ) 40 MG tablet Take 1 tablet (40 mg total) by mouth 2 (two) times daily. 120 tablet 2   polyethylene glycol powder (MIRALAX ) 17 GM/SCOOP powder Take 1 capful (17 g total) and mix in 8 oz of clear liquid and drink by mouth daily as needed for moderate constipation or severe constipation. 510 g 0   promethazine  (PHENERGAN ) 12.5 MG tablet Take 1 tablet (12.5 mg total) by mouth every 8 (eight) hours as needed for nausea or vomiting. 20 tablet 0   promethazine  (PHENERGAN ) 12.5 MG tablet Take 1  tablet (12.5 mg total) by mouth every 8 (eight) hours as needed for nausea or vomiting. 20 tablet 0   simvastatin  (ZOCOR ) 40 MG tablet Take 1 tablet (40 mg total) by mouth at bedtime. 90 tablet 1   Vitamin D , Ergocalciferol , (DRISDOL ) 1.25 MG (50000 UNIT) CAPS capsule Take 1 capsule (50,000 Units total) by mouth every 7 (seven) days. 7 capsule 0   No current facility-administered medications on file prior to visit.    Family History  Problem Relation Age of Onset   Diabetes Mother    Hypertension Mother    Depression Mother    Diabetes Sister    Hypertension Father    Cancer Father        Prostate   Alcohol abuse Father    Cancer Brother        Prostate   Breast cancer Neg Hx    Colon cancer Neg Hx    Colon polyps Neg Hx    Esophageal  cancer Neg Hx    Stomach cancer Neg Hx    Rectal cancer Neg Hx     Social History   Socioeconomic History   Marital status: Single    Spouse name: Not on file   Number of children: 2   Years of education: Not on file   Highest education level: 12th grade  Occupational History   Occupation: Unemployed  Tobacco Use   Smoking status: Never   Smokeless tobacco: Never  Vaping Use   Vaping status: Never Used  Substance and Sexual Activity   Alcohol use: No   Drug use: No   Sexual activity: Not on file  Other Topics Concern   Not on file  Social History Narrative   Lives with   2 Children: Ages 66 and 28   4 Grandchildren: 2, 3, 4 and 56   Brother      Diet: "Barely eats". Does not exercise due to pain.      03/2011- Currently unemployed. 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 Unsure of last pap.   Unsure of last Td   Unsure of last flu shot   Social Drivers of Corporate investment banker Strain: Not on file  Food Insecurity: No Food Insecurity (09/11/2021)   Hunger Vital Sign    Worried About Running Out of Food in the Last Year: Never true    Ran Out of Food in the Last Year: Never true  Transportation Needs: No Transportation Needs (09/11/2021)   PRAPARE - Administrator, Civil Service (Medical): No    Lack of Transportation (Non-Medical): No  Physical Activity: Not on file  Stress: Not on file  Social Connections: Unknown (10/20/2021)   Received from [facility], [facility]   Social Network    Social Network: Not on file  Intimate Partner Violence: Unknown (09/10/2021)   Received from [facility], [facility]   HITS    Physically Hurt: Not on file    Insult or Talk Down To: Not on file    Threaten Physical Harm: Not on file    Scream or Curse: Not on file    Review of Systems: ROS negative except for what is noted on the assessment and plan.  Vitals:   07/19/23 1608 07/19/23 1657   BP: (!) 152/94 (!) 146/90  Pulse: 99 93  Temp: 98.1 F (36.7 C)   TempSrc: Oral   SpO2: 96%  Weight: 161 lb 14.4 oz (73.4 kg)     Physical Exam: Constitutional: well-appearing female Cardiovascular: regular rate and rhythm, no m/r/g Pulmonary/Chest: normal work of breathing on room air, lungs clear to auscultation bilaterally Abdominal: soft, non-tender, non-distended MSK: normal bulk and tone Neurological: alert & oriented x 3, no focal deficits Skin: warm and dry Psych: normal mood and behavior  Assessment & Plan:   Patient discussed with Dr. Crystal Dory  Essential hypertension Blood pressure today is elevated at 146/90.  Patient is taking lisinopril -hydrochlorothiazide  20-25 mg daily.  She has no concerns about this medication.  She denies any symptoms associated with hypertension.  She says she has a blood pressure cuff at her work and her blood pressure is usually lower, so she would like to hold off on any medication changes at this time.  I encouraged her to check her blood pressure at work and to record and bring it to her next visit so we have a more accurate understanding of her daily blood pressure.  Type 2 diabetes mellitus not at goal [facility]) Hemoglobin A1c is 6.8.  Patient is not currently on any diabetes medications.  She denies any polyuria, polydipsia, or neuropathy.  Spoke with her about metformin , however she has had family members who have had terrible experiences with metformin  so she is adamant about not starting that medication.  I think she could benefit from Wegovy  for weight loss, which would also concurrently help her diabetes and hypertension.  In addition, I have encouraged her can continue to limit her simple carbohydrate intake and to focus on eating healthy foods and exercising daily.   Obesity Her BMI is elevated at 31, consistent with obesity.  She has been trying lifestyle interventions including exercise and dietary modifications with little success in  her weight loss.  I think she could benefit from Wegovy  for her obesity, so I have sent in a prescription for the starting dose (0.25 mg weekly).  I expect that this may also benefit her hypertension and diabetes control.  We will follow-up in 4 weeks.  Fibromyalgia She has fibromyalgia and chronic pain syndrome, for which she was previously seeing a pain management doctor.  She is requesting referral to another pain management clinic, so I have placed one.  Dorthy Gavia, MD [facility] Internal Medicine, PGY-1 Phone: [phone] Date 07/19/2023 Time 5:06 PM

## 2023-07-19 NOTE — Assessment & Plan Note (Addendum)
 Hemoglobin A1c is 6.8.  Patient is not currently on any diabetes medications.  She denies any polyuria, polydipsia, or neuropathy.  Spoke with her about metformin , however she has had family members who have had terrible experiences with metformin  so she is adamant about not starting that medication.  I think she could benefit from Wegovy  for weight loss, which would also concurrently help her diabetes and hypertension.  In addition, I have encouraged her can continue to limit her simple carbohydrate intake and to focus on eating healthy foods and exercising daily.

## 2023-07-19 NOTE — Assessment & Plan Note (Addendum)
 Her BMI is elevated at 31, consistent with obesity.  She has been trying lifestyle interventions including exercise and dietary modifications with little success in her weight loss.  I think she could benefit from Wegovy  for her obesity, so I have sent in a prescription for the starting dose (0.25 mg weekly).  I expect that this may also benefit her hypertension and diabetes control.  We will follow-up in 4 weeks.

## 2023-07-19 NOTE — Assessment & Plan Note (Addendum)
 Blood pressure today is elevated at 146/90.  Patient is taking lisinopril -hydrochlorothiazide  20-25 mg daily.  She has no concerns about this medication.  She denies any symptoms associated with hypertension.  She says she has a blood pressure cuff at her work and her blood pressure is usually lower, so she would like to hold off on any medication changes at this time.  I encouraged her to check her blood pressure at work and to record and bring it to her next visit so we have a more accurate understanding of her daily blood pressure.

## 2023-07-19 NOTE — Patient Instructions (Addendum)
 Thank you, Ms.Keari T Spoonemore for allowing us  to provide your care today. Today we discussed your high blood pressure, diabetes, and weight loss.    Your hemoglobin A1c was 6.8 today, stable from your prior visit.  As we discussed, I will send in a prescription for Wegovy  which should help with your weight loss and your diabetes.   We are checking a basic metabolic panel today to look at your electrolytes and kidney function.  As soon as I get the results I will give you a call.  I have placed a referral to go for pain management clinic and to the ophthalmologist for a diabetic eye exam.  They should be contacting you soon to schedule an appointment.  I have ordered the following labs for you:  Lab Orders         Glucose, capillary         BMP8+Anion Gap         POC Hbg A1C      Referrals ordered today:   Referral Orders         Ambulatory referral to Ophthalmology       I have ordered the following medication/changed the following medications:   Stop the following medications: Medications Discontinued During This Encounter  Medication Reason   Dulaglutide  (TRULICITY ) 1.5 MG/0.5ML SOAJ Change in therapy     Start the following medications: Meds ordered this encounter  Medications   Semaglutide -Weight Management (WEGOVY ) 0.25 MG/0.5ML SOAJ    Sig: Inject 0.25 mg into the skin once a week.    Dispense:  2 mL    Refill:  0     Follow up: 4 weeks  We look forward to seeing you next time. Please call our clinic at [phone] if you have any questions or concerns. The best time to call is Monday-Friday from 9am-4pm, but there is someone available 24/7. If after hours or the weekend, call the main hospital number and ask for the Internal Medicine Resident On-Call. If you need medication refills, please notify your pharmacy one week in advance and they will send us  a request.   Thank you for trusting me with your care. Wishing you the best!   Dorthy Gavia, MD [facility]  Internal Medicine Center

## 2023-07-20 ENCOUNTER — Other Ambulatory Visit (HOSPITAL_COMMUNITY): Payer: Self-pay

## 2023-07-20 ENCOUNTER — Other Ambulatory Visit: Payer: Self-pay | Admitting: Student

## 2023-07-20 ENCOUNTER — Telehealth: Payer: Self-pay

## 2023-07-20 DIAGNOSIS — Z0000 Encounter for general adult medical examination without abnormal findings: Secondary | ICD-10-CM

## 2023-07-20 LAB — BMP8+ANION GAP
Anion Gap: 17 mmol/L (ref 10.0–18.0)
BUN/Creatinine Ratio: 9 — ABNORMAL LOW (ref 12–28)
BUN: 7 mg/dL — ABNORMAL LOW (ref 8–27)
CO2: 23 mmol/L (ref 20–29)
Calcium: 10.5 mg/dL — ABNORMAL HIGH (ref 8.7–10.3)
Chloride: 97 mmol/L (ref 96–106)
Creatinine, Ser: 0.82 mg/dL (ref 0.57–1.00)
Glucose: 94 mg/dL (ref 70–99)
Potassium: 4.2 mmol/L (ref 3.5–5.2)
Sodium: 137 mmol/L (ref 134–144)
eGFR: 82 mL/min/{1.73_m2} (ref >59)

## 2023-07-20 NOTE — Telephone Encounter (Signed)
Prior Authorization for patient Sherry [name] 0.25MG /0.5ML auto-injectors) came through on cover my meds was submitted with last office notes and labs awaiting approval or denial.  WUJ:[id number]

## 2023-07-21 NOTE — Telephone Encounter (Signed)
Decision:Denied  Sherry [name] (Key: [id number]) Rx #: [id number] QIONGE 0.25MG /0.5ML auto-injectors Form [employer] Commercial / Chief of Staff Prior Authorization Form Created  Your prior authorization for [name] has been denied. Return to Dashboard When applicable, information about how to complete an appeal for this patient will be sent to you. Please also see the determination letter provided by the payer/PBM for more information.  Message from plan: Denied. This drug is not covered because weight loss drugs is not covered by [employer] [facility]. Please refer to your Member Handbook for details.

## 2023-07-28 NOTE — Telephone Encounter (Signed)
Letter sent through [facility] formally informing of discharge from clinic.

## 2023-07-29 NOTE — Progress Notes (Signed)
 Internal Medicine Clinic Attending  Case discussed with the resident at the time of the visit.  We reviewed the resident's history and exam and pertinent patient test results.  I agree with the assessment, diagnosis, and plan of care documented in the resident's note.

## 2023-08-03 ENCOUNTER — Ambulatory Visit: Payer: No Typology Code available for payment source | Admitting: Registered Nurse

## 2023-08-09 ENCOUNTER — Telehealth: Payer: Self-pay | Admitting: *Deleted

## 2023-08-09 NOTE — Telephone Encounter (Signed)
 Call from pt c/o intermittent chest pain x 1 week. States it's non-radiating. Denies having sob. No other pain;states she already has back pain.No pain at this time. No available appts today; prefers a morning appt. Appt schedule w/Dr Annie Paras 3/5 @ [phone]. Pt knows to go to the ER if pain starts back prior to her appt.

## 2023-08-10 ENCOUNTER — Encounter: Admitting: Student

## 2023-08-11 ENCOUNTER — Other Ambulatory Visit (HOSPITAL_COMMUNITY): Payer: Self-pay

## 2023-08-11 ENCOUNTER — Encounter: Admitting: Student

## 2023-08-11 ENCOUNTER — Telehealth: Payer: Self-pay

## 2023-08-11 ENCOUNTER — Ambulatory Visit (INDEPENDENT_AMBULATORY_CARE_PROVIDER_SITE_OTHER): Admitting: Student

## 2023-08-11 VITALS — BP 131/77 | HR 98 | Temp 98.4°F | Ht 60.0 in | Wt 164.8 lb

## 2023-08-11 DIAGNOSIS — J189 Pneumonia, unspecified organism: Secondary | ICD-10-CM | POA: Diagnosis not present

## 2023-08-11 DIAGNOSIS — F419 Anxiety disorder, unspecified: Secondary | ICD-10-CM

## 2023-08-11 DIAGNOSIS — Z7985 Long-term (current) use of injectable non-insulin antidiabetic drugs: Secondary | ICD-10-CM

## 2023-08-11 DIAGNOSIS — J690 Pneumonitis due to inhalation of food and vomit: Secondary | ICD-10-CM

## 2023-08-11 DIAGNOSIS — E1165 Type 2 diabetes mellitus with hyperglycemia: Secondary | ICD-10-CM | POA: Diagnosis not present

## 2023-08-11 DIAGNOSIS — E119 Type 2 diabetes mellitus without complications: Secondary | ICD-10-CM

## 2023-08-11 DIAGNOSIS — M797 Fibromyalgia: Secondary | ICD-10-CM

## 2023-08-11 DIAGNOSIS — R0789 Other chest pain: Secondary | ICD-10-CM

## 2023-08-11 MED ORDER — BUSPIRONE HCL 7.5 MG PO TABS
7.5000 mg | ORAL_TABLET | Freq: Two times a day (BID) | ORAL | 3 refills | Status: AC
  Filled 2023-08-11: qty 60, 30d supply, fill #0
  Filled 2023-10-05: qty 60, 30d supply, fill #1
  Filled 2023-11-25: qty 60, 30d supply, fill #2

## 2023-08-11 MED ORDER — SEMAGLUTIDE(0.25 OR 0.5MG/DOS) 2 MG/3ML ~~LOC~~ SOPN
0.2500 mg | PEN_INJECTOR | SUBCUTANEOUS | 3 refills | Status: DC
  Filled 2023-08-11: qty 3, 28d supply, fill #0

## 2023-08-11 NOTE — Assessment & Plan Note (Addendum)
 She has a >10-year history with this.  She endorses substernal chest pain that is now occurring nearly every day and last from minutes to hours.  Nonexertional nonpleuritic.  There is occasional radiation to left shoulder but no associated shortness of breath, N/V, diaphoresis.  Given the atypical nature, it is difficult to determine the etiology but overall low concern for ACS.  She does have cardiac risk factors involving hypertension, hyperlipidemia, diabetes though these are overall controlled.  She does endorse anxiety and states that symptoms are at times related to feeling anxious.  Denies symptoms of panic attacks.  She has not seen a cardiologist. -Referral to cardiology to rule cardiac etiology

## 2023-08-11 NOTE — Patient Instructions (Signed)
 Follow up in one month.

## 2023-08-11 NOTE — Progress Notes (Signed)
 CC: Chest pain  HPI:  Sherry [name] is a 63 y.o. female living with a history stated below and presents today for chest pain. Please see problem based assessment and plan for additional details.  Past Medical History:  Diagnosis Date   Acid reflux disease    Anginal pain (HCC)    last time 04/18/13 in afternoon; referred to Dr. 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 Swallow whole.     aspirin-acetaminophen-caffeine (EXCEDRIN MIGRAINE) 250-250-65 MG tablet Take 2 tablets by mouth every 6 (six) hours as needed for headache.     Blood Glucose Monitoring Suppl DEVI 1 each by Does not apply route in the morning, at noon, and at bedtime. May substitute to any manufacturer covered by patient's insurance. 1 each 0   cetirizine (ZYRTEC ALLERGY) 10 MG tablet Take 1 tablet (10 mg total) by mouth daily. 30 tablet 2   cyclobenzaprine (FLEXERIL) 10 MG tablet Take 1 tablet (10 mg total) by mouth 2 (two) times daily as needed. 40 tablet 2   diclofenac Sodium (VOLTAREN ARTHRITIS PAIN) 1 % GEL Apply 2 g topically 4 (four) times daily. 50 g 1   diclofenac Sodium (VOLTAREN) 1 % GEL Apply 2 grams topically 4 (four) times daily. 100 g 0   docusate sodium (COLACE) 100 MG capsule Take 1 capsule (100 mg total) by mouth every 12 (twelve) hours. 60 capsule 0   empagliflozin (JARDIANCE) 10 MG TABS tablet Take 1  tablet (10 mg total) by mouth daily before breakfast. (Patient not taking: Reported on 07/05/2023) 30 tablet 3   hydrOXYzine (ATARAX) 10 MG tablet Take 1 tablet (10 mg total) by mouth 3 (three) times daily as needed. (Patient not taking: Reported on 07/05/2023) 30 tablet 0   lisinopril-hydrochlorothiazide (ZESTORETIC) 20-25 MG tablet Take 1 tablet by mouth daily. 30 tablet 11   meloxicam (MOBIC) 15 MG tablet Take 1 tablet (15 mg total) by mouth daily. (Patient not taking: Reported on 07/05/2023) 30 tablet 0   oxyCODONE-acetaminophen (PERCOCET) 5-325 MG tablet Take 1 tablet by mouth 2 (two) times daily as needed for severe pain. 60 tablet 0   pantoprazole (PROTONIX) 40 MG tablet Take 1 tablet (40 mg total) by mouth 2 (two) times daily. 120 tablet 2   polyethylene glycol powder (MIRALAX) 17 GM/SCOOP powder Take 1 capful (17 g total) and mix in 8 oz of clear liquid and drink by mouth daily as needed for moderate constipation or severe constipation. 510 g 0   promethazine (PHENERGAN) 12.5 MG tablet Take 1 tablet (12.5 mg total) by mouth every 8 (eight) hours as needed for nausea or vomiting. 20 tablet 0   promethazine (PHENERGAN) 12.5 MG tablet Take 1  tablet (12.5 mg total) by mouth every 8 (eight) hours as needed for nausea or vomiting. 20 tablet 0   simvastatin (ZOCOR) 40 MG tablet Take 1 tablet (40 mg total) by mouth at bedtime. 90 tablet 1   Vitamin D, Ergocalciferol, (DRISDOL) 1.25 MG (50000 UNIT) CAPS capsule Take 1 capsule (50,000 Units total) by mouth every 7 (seven) days. 7 capsule 0   No current facility-administered medications on file prior to visit.    Family History  Problem Relation Age of Onset   Diabetes Mother    Hypertension Mother    Depression Mother    Diabetes Sister    Hypertension Father    Cancer Father        Prostate   Alcohol abuse Father    Cancer Brother        Prostate   Breast cancer Neg Hx    Colon cancer Neg Hx    Colon polyps Neg Hx    Esophageal cancer Neg  Hx    Stomach cancer Neg Hx    Rectal cancer Neg Hx     Social History   Socioeconomic History   Marital status: Single    Spouse name: Not on file   Number of children: 2   Years of education: Not on file   Highest education level: 12th grade  Occupational History   Occupation: Unemployed  Tobacco Use   Smoking status: Never   Smokeless tobacco: Never  Vaping Use   Vaping status: Never Used  Substance and Sexual Activity   Alcohol use: No   Drug use: No   Sexual activity: Not on file  Other Topics Concern   Not on file  Social History Narrative   Lives with   2 Children: Ages 31 and 62   4 Grandchildren: 2, 3, 4 and 27   Brother      Diet: "Barely eats". Does not exercise due to pain.      03/2011- Currently unemployed. 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 Unsure of last pap.   Unsure of last Td   Unsure of last flu shot   Social Drivers of Corporate investment banker Strain: Not on file  Food Insecurity: No Food Insecurity (09/11/2021)   Hunger Vital Sign    Worried About Running Out of Food in the Last Year: Never true    Ran Out of Food in the Last Year: Never true  Transportation Needs: No Transportation Needs (09/11/2021)   PRAPARE - Administrator, Civil Service (Medical): No    Lack of Transportation (Non-Medical): No  Physical Activity: Not on file  Stress: Not on file  Social Connections: Unknown (10/20/2021)   Received from [facility], [facility]   Social Network    Social Network: Not on file  Intimate Partner Violence: Unknown (09/10/2021)   Received from [facility], [facility]   HITS    Physically Hurt: Not on file    Insult or Talk Down To: Not on file    Threaten Physical Harm: Not on file    Scream or Curse: Not on file    Review of Systems: ROS negative except for what is noted on the assessment and plan.  Vitals:   08/11/23 0846  BP: 131/77  Pulse: 98   Temp: 98.4 F (36.9 C)  TempSrc: Oral  SpO2: 96%  Weight: 164 lb 12.8 oz (74.8 kg)  Height: 5' (1.524 m)    Physical Exam: Constitutional: well-appearing, sitting in chair, in no acute distress Cardiovascular: regular rate and rhythm, no m/r/g, no LEE Pulmonary/Chest: normal work of breathing on room air, lungs clear to auscultation bilaterally Abdominal: soft, non-tender, non-distended MSK: No sternal/costochondral TTP Psych: normal mood and behavior  Assessment & Plan:     Patient discussed with Dr. Sol Blazing  Atypical chest pain She has a >10-year history with this.  She endorses substernal chest pain that is now occurring nearly every day and last from minutes to hours.  Nonexertional nonpleuritic.  There is occasional radiation to left shoulder but no associated shortness of breath, N/V, diaphoresis.  Given the atypical nature, it is difficult to determine the etiology but overall low concern for ACS.  She does have cardiac risk factors involving hypertension, hyperlipidemia, diabetes though these are overall controlled.  She does endorse anxiety and states that symptoms are at times related to feeling anxious.  Denies symptoms of panic attacks.  She has not seen a cardiologist. -Referral to cardiology to rule cardiac etiology  Anxiety Endorses chronic generalized anxiety.  PHQ-9 and GAD-7 today are 1 and 1.  Denies SI/HI.  Would like to try BuSpar as she states she has tried Zoloft, Lexapro, and Celexa in the past but no record of this.  -BuSpar 7.5 mg BID -Follow-up in one month  Right upper lobe pneumonia Patient went to ED 06/10/2023 for URI symptoms.  DG chest showed possible right upper lobe consolidation.  She was treated with azithromycin and Augmentin which she completed.  Currently has a lingering dry cough but denies fever/chills. -Repeat CT chest to ensure improvement  Type 2 diabetes mellitus not at goal [facility]) [name] was denied but it appears indication was only for  weight loss. -Sent in Ozempic 0.25/weekly as A1c is 6.8 and she is obese -Ophthalmology referral  Fibromyalgia Resent referral to pain clinic   Carmina Miller, D.O. [facility] Internal Medicine, PGY-1 Phone: [phone] Date 08/11/2023 Time 10:42 AM

## 2023-08-11 NOTE — Telephone Encounter (Signed)
 Prior Authorization for patient (Ozempic (0.25 or 0.5 MG/DOSE) 2MG /3ML pen-injectors) came through on cover my meds was submitted with last office notes and labs awaiting approval or denial.   ZOX:[id number]

## 2023-08-11 NOTE — Assessment & Plan Note (Signed)
 Patient went to ED 06/10/2023 for URI symptoms.  DG chest showed possible right upper lobe consolidation.  She was treated with azithromycin and Augmentin which she completed.  Currently has a lingering dry cough but denies fever/chills. -Repeat CT chest to ensure improvement

## 2023-08-11 NOTE — Assessment & Plan Note (Signed)
 Resent referral to pain clinic

## 2023-08-11 NOTE — Assessment & Plan Note (Addendum)
 Sherry [name] was denied but it appears indication was only for weight loss. -Sent in Ozempic 0.25/weekly as A1c is 6.8 and she is obese -Ophthalmology referral

## 2023-08-11 NOTE — Assessment & Plan Note (Signed)
 Endorses chronic generalized anxiety.  PHQ-9 and GAD-7 today are 1 and 1.  Denies SI/HI.  Would like to try BuSpar as she states she has tried Zoloft, Lexapro, and Celexa in the past but no record of this.  -BuSpar 7.5 mg BID -Follow-up in one month

## 2023-08-12 ENCOUNTER — Telehealth: Payer: Self-pay | Admitting: *Deleted

## 2023-08-12 NOTE — Progress Notes (Signed)
 Internal Medicine Clinic Attending  Case discussed with the resident at the time of the visit.  We reviewed the resident's history and exam and pertinent patient test results.  I agree with the assessment, diagnosis, and plan of care documented in the resident's note with the following clarification: repeat CXR to evaluate RUL findings, not CT chest.

## 2023-08-12 NOTE — Telephone Encounter (Signed)
 Mammogram appointment AUGUST 1.2025 @ 11:00 am to arrive 10:45a am/ patient contacted and appointment mailed to the patient. Patient aware to call office 24 hours prior to rescheduling -canceling appointment or there will be a 75.00 no show fee charge.

## 2023-08-16 ENCOUNTER — Encounter: Admitting: Student

## 2023-08-16 ENCOUNTER — Ambulatory Visit: Admitting: Nurse Practitioner

## 2023-08-16 NOTE — Telephone Encounter (Signed)
 PA is still denied  Sherry [name] (Key: Simonne Maffucci) Ozempic (0.25 or 0.5 MG/DOSE) 2MG /3ML pen-injectors Form [employer] / Chief of Staff Prior Authorization Form Created Message from Plan Denied  Patient has to have tried/ failed metformin for a minimum of 3 months.

## 2023-08-16 NOTE — Telephone Encounter (Addendum)
 Sherry [name] (Key: [id number]) Rx #: [id number] Ozempic (0.25 or 0.5 MG/DOSE) 2MG /3ML pen-injectors Form [employer] Commercial / Chief of Staff Prior Authorization Form Created Message from Plan Denied  No information for denial reason, prior authorization has been resubmitted.  WJX:[id number]

## 2023-08-17 ENCOUNTER — Other Ambulatory Visit: Payer: Self-pay | Admitting: Registered Nurse

## 2023-08-17 ENCOUNTER — Other Ambulatory Visit (HOSPITAL_COMMUNITY): Payer: Self-pay

## 2023-08-17 ENCOUNTER — Other Ambulatory Visit: Payer: Self-pay | Admitting: Internal Medicine

## 2023-08-17 ENCOUNTER — Encounter (HOSPITAL_COMMUNITY): Payer: Self-pay

## 2023-08-17 ENCOUNTER — Other Ambulatory Visit: Payer: Self-pay

## 2023-08-17 DIAGNOSIS — E11649 Type 2 diabetes mellitus with hypoglycemia without coma: Secondary | ICD-10-CM

## 2023-08-17 DIAGNOSIS — M797 Fibromyalgia: Secondary | ICD-10-CM

## 2023-08-17 MED ORDER — PROMETHAZINE HCL 12.5 MG PO TABS
12.5000 mg | ORAL_TABLET | Freq: Three times a day (TID) | ORAL | 1 refills | Status: DC | PRN
  Filled 2023-08-17: qty 20, 7d supply, fill #0
  Filled 2023-10-05: qty 20, 7d supply, fill #1

## 2023-08-17 NOTE — Telephone Encounter (Signed)
 Medication sent to pharmacy

## 2023-08-27 ENCOUNTER — Ambulatory Visit: Admitting: Obstetrics

## 2023-09-08 ENCOUNTER — Encounter: Admitting: Internal Medicine

## 2023-10-05 ENCOUNTER — Other Ambulatory Visit (HOSPITAL_COMMUNITY): Payer: Self-pay

## 2023-10-05 ENCOUNTER — Other Ambulatory Visit: Payer: Self-pay | Admitting: Registered Nurse

## 2023-10-05 ENCOUNTER — Other Ambulatory Visit: Payer: Self-pay | Admitting: Internal Medicine

## 2023-10-05 ENCOUNTER — Ambulatory Visit: Admitting: Obstetrics

## 2023-10-05 VITALS — BP 164/96 | HR 94 | Ht 60.0 in | Wt 160.8 lb

## 2023-10-05 DIAGNOSIS — G43111 Migraine with aura, intractable, with status migrainosus: Secondary | ICD-10-CM

## 2023-10-05 DIAGNOSIS — M797 Fibromyalgia: Secondary | ICD-10-CM

## 2023-10-05 DIAGNOSIS — R6882 Decreased libido: Secondary | ICD-10-CM | POA: Diagnosis not present

## 2023-10-05 MED ORDER — VENLAFAXINE HCL ER 37.5 MG PO CP24
37.5000 mg | ORAL_CAPSULE | Freq: Every day | ORAL | 11 refills | Status: DC
  Filled 2023-10-05: qty 30, 30d supply, fill #0

## 2023-10-05 NOTE — Progress Notes (Signed)
 Patient ID: Sherry [name], female   DOB: [birth year], 63 y.o.   MRN: [id number]  Chief Complaint  Patient presents with   Sexual Problem    Drive    HPI Sherry [name] is a 63 y.o. female.   Complains of decreased libido and severe migraines with orgasm. HPI  Past Medical History:  Diagnosis Date   Acid reflux disease    Anginal pain (HCC)    last time 04/18/13 in afternoon; referred to Dr. 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   Chest pain 04/07/2011   Chronic pain    Chronic pain 06/19/2014   Cough 09/25/2011   Depression    Fast heart beat    Fibromyalgia    Headache(784.0) 12/27/2012   History of blood transfusion 1981   childbirth   History of kidney stones    passed   Hypertension    Influenza A 07/08/2017   Left wrist pain 03/10/2013   Migraine    last one was 04/18/13   Sleep apnea    does not use CPAP   Trichomonas contact, treated     Past Surgical History:  Procedure Laterality Date   CHOLECYSTECTOMY     2008   ORIF CALCANEOUS FRACTURE Left 05/16/2013   Procedure: OPEN REDUCTION INTERNAL FIXATION (ORIF) LEFT CALCANEOUS FRACTURE;  Surgeon: Arlette Lagos, MD;  Location: [facility] OR;  Service: Orthopedics;  Laterality: Left;   TOTAL ABDOMINAL HYSTERECTOMY     1986   WISDOM TOOTH EXTRACTION      Family History  Problem Relation Age of Onset   Diabetes Mother    Hypertension Mother    Depression Mother    Diabetes Sister    Hypertension Father    Cancer Father        Prostate   Alcohol abuse Father    Cancer Brother        Prostate   Breast cancer Neg Hx    Colon cancer Neg Hx    Colon polyps Neg Hx    Esophageal cancer Neg Hx    Stomach cancer Neg Hx    Rectal cancer Neg Hx     Social History Social History   Tobacco Use   Smoking status: Never   Smokeless tobacco: Never  Vaping Use   Vaping status: Never Used  Substance Use Topics   Alcohol use: No   Drug use: No    Allergies  Allergen Reactions    Duloxetine  Swelling   Duloxetine  Hcl Other (See Comments)    Nausea, vomiting   Pregabalin Nausea And Vomiting and Swelling   Gabapentin  Other (See Comments)    Weight gain    Current Outpatient Medications  Medication Sig Dispense Refill   busPIRone  (BUSPAR ) 7.5 MG tablet Take 1 tablet (7.5 mg total) by mouth 2 (two) times daily. 60 tablet 3   cetirizine  (ZYRTEC  ALLERGY) 10 MG tablet Take 1 tablet (10 mg total) by mouth daily. 30 tablet 2   cyclobenzaprine  (FLEXERIL ) 10 MG tablet Take 1 tablet (10 mg total) by mouth 2 (two) times daily as needed. 40 tablet 2   diclofenac  Sodium (VOLTAREN  ARTHRITIS PAIN) 1 % GEL Apply 2 g topically 4 (four) times daily. 50 g 1   docusate sodium  (COLACE) 100 MG capsule Take 1 capsule (100 mg total) by mouth every 12 (twelve) hours. 60 capsule 0   pantoprazole  (PROTONIX ) 40 MG tablet Take 1 tablet (40 mg total) by mouth 2 (  two) times daily. 120 tablet 2   polyethylene glycol powder (MIRALAX ) 17 GM/SCOOP powder Take 1 capful (17 g total) and mix in 8 oz of clear liquid and drink by mouth daily as needed for moderate constipation or severe constipation. 510 g 0   promethazine  (PHENERGAN ) 12.5 MG tablet Take 1 tablet (12.5 mg total) by mouth every 8 (eight) hours as needed for nausea or vomiting. 20 tablet 0   simvastatin  (ZOCOR ) 40 MG tablet Take 1 tablet (40 mg total) by mouth at bedtime. 90 tablet 1   venlafaxine  XR (EFFEXOR -XR) 37.5 MG 24 hr capsule Take 1 capsule (37.5 mg total) by mouth daily with breakfast. 30 capsule 11   aspirin  EC 81 MG tablet Take 81 mg by mouth daily. Swallow whole.     aspirin -acetaminophen -caffeine (EXCEDRIN MIGRAINE) 250-250-65 MG tablet Take 2 tablets by mouth every 6 (six) hours as needed for headache.     Blood Glucose Monitoring Suppl DEVI 1 each by Does not apply route in the morning, at noon, and at bedtime. May substitute to any manufacturer covered by patient's insurance. (Patient not taking: Reported on 10/05/2023) 1 each 0    diclofenac  Sodium (VOLTAREN ) 1 % GEL Apply 2 grams topically 4 (four) times daily. (Patient not taking: Reported on 10/05/2023) 100 g 0   empagliflozin  (JARDIANCE ) 10 MG TABS tablet Take 1 tablet (10 mg total) by mouth daily before breakfast. (Patient not taking: Reported on 10/05/2023) 30 tablet 3   hydrOXYzine  (ATARAX ) 10 MG tablet Take 1 tablet (10 mg total) by mouth 3 (three) times daily as needed. (Patient not taking: Reported on 10/05/2023) 30 tablet 0   lisinopril -hydrochlorothiazide  (ZESTORETIC ) 20-25 MG tablet Take 1 tablet by mouth daily. 30 tablet 11   meloxicam  (MOBIC ) 15 MG tablet Take 1 tablet (15 mg total) by mouth daily. (Patient not taking: Reported on 07/05/2023) 30 tablet 0   oxyCODONE -acetaminophen  (PERCOCET) 5-325 MG tablet Take 1 tablet by mouth 2 (two) times daily as needed for severe pain. (Patient not taking: Reported on 10/05/2023) 60 tablet 0   promethazine  (PHENERGAN ) 12.5 MG tablet Take 1 tablet (12.5 mg total) by mouth every 8 (eight) hours as needed for nausea or vomiting. 20 tablet 1   Semaglutide ,0.25 or 0.5MG /DOS, 2 MG/3ML SOPN Inject 0.25 mg into the skin once a week. (Patient not taking: Reported on 10/05/2023) 3 mL 3   Vitamin D , Ergocalciferol , (DRISDOL ) 1.25 MG (50000 UNIT) CAPS capsule Take 1 capsule (50,000 Units total) by mouth every 7 (seven) days. (Patient not taking: Reported on 10/05/2023) 7 capsule 0   No current facility-administered medications for this visit.    Review of Systems Review of Systems Constitutional: negative for fatigue and weight loss Respiratory: negative for cough and wheezing Cardiovascular: negative for chest pain, fatigue and palpitations Gastrointestinal: negative for abdominal pain and change in bowel habits Genitourinary:negative Integument/breast: negative for nipple discharge Musculoskeletal:negative for myalgias Neurological: negative for gait problems and tremors Behavioral/Psych: negative for abusive relationship,  depression Endocrine: negative for temperature intolerance      Blood pressure (!) 164/96, pulse 94, height 5' (1.524 m), weight 160 lb 12.8 oz (72.9 kg).  Physical Exam Physical Exam General:   Alert and no distress  Skin:   no rash or abnormalities  Lungs:   clear to auscultation bilaterally  Heart:   regular rate and rhythm, S1, S2 normal, no murmur, click, rub or gallop  The remainder of the physical exam deferred due to the type of encounter being consultative.  I have spent a total of 15 minutes of face-to-face time, excluding clinical staff time, reviewing notes and preparing to see patient, ordering tests and/or medications, and counseling the patient.   Data Reviewed Sexual History  Assessment     1. Libido, decreased (Primary) Rx: - venlafaxine  XR (EFFEXOR -XR) 37.5 MG 24 hr capsule; Take 1 capsule (37.5 mg total) by mouth daily with breakfast.  Dispense: 30 capsule; Refill: 11  2. Migraine with aura, with intractable migraine, so stated, with status migrainosus Rx: - Ambulatory referral to Neurology     Plan  Follow up in 6 weeks virtually   Orders Placed This Encounter  Procedures   Ambulatory referral to Neurology    Referral Priority:   Routine    Referral Type:   Consultation    Referral Reason:   Specialty Services Required    Requested Specialty:   Neurology    Number of Visits Requested:   1   Meds ordered this encounter  Medications   venlafaxine  XR (EFFEXOR -XR) 37.5 MG 24 hr capsule    Sig: Take 1 capsule (37.5 mg total) by mouth daily with breakfast.    Dispense:  30 capsule    Refill:  11     Gabrielle Joiner, MD, FACOG Attending Obstetrician & Gynecologist, [facility] for [employer], [facility], [place] 10/05/2023

## 2023-10-06 ENCOUNTER — Other Ambulatory Visit: Payer: Self-pay

## 2023-10-06 ENCOUNTER — Other Ambulatory Visit (HOSPITAL_COMMUNITY): Payer: Self-pay

## 2023-10-06 MED ORDER — DICLOFENAC SODIUM 1 % EX GEL
2.0000 g | Freq: Four times a day (QID) | CUTANEOUS | 0 refills | Status: DC
  Filled 2023-10-06: qty 100, 12d supply, fill #0

## 2023-10-07 ENCOUNTER — Other Ambulatory Visit (HOSPITAL_COMMUNITY): Payer: Self-pay

## 2023-10-07 ENCOUNTER — Encounter (HOSPITAL_COMMUNITY): Payer: Self-pay

## 2023-10-10 ENCOUNTER — Other Ambulatory Visit: Payer: Self-pay

## 2023-10-10 ENCOUNTER — Ambulatory Visit
Admission: EM | Admit: 2023-10-10 | Discharge: 2023-10-10 | Disposition: A | Discharge status: Home / Self Care | Attending: Physician Assistant | Admitting: Physician Assistant

## 2023-10-10 DIAGNOSIS — M545 Low back pain, unspecified: Secondary | ICD-10-CM | POA: Diagnosis not present

## 2023-10-10 LAB — POCT URINALYSIS DIP (MANUAL ENTRY)
Bilirubin, UA: NEGATIVE
Blood, UA: NEGATIVE
Glucose, UA: NEGATIVE mg/dL
Ketones, POC UA: NEGATIVE mg/dL
Leukocytes, UA: NEGATIVE
Nitrite, UA: NEGATIVE
Protein Ur, POC: 30 mg/dL — AB
Spec Grav, UA: 1.015 (ref 1.010–1.025)
Urobilinogen, UA: 0.2 U/dL
pH, UA: 7 (ref 5.0–8.0)

## 2023-10-10 MED ORDER — TRIAMCINOLONE ACETONIDE 40 MG/ML IJ SUSP
40.0000 mg | Freq: Once | INTRAMUSCULAR | Status: AC
  Administered 2023-10-10: 40 mg via INTRAMUSCULAR

## 2023-10-10 MED ORDER — METHOCARBAMOL 750 MG PO TABS: 750.0000 mg | ORAL_TABLET | Freq: Three times a day (TID) | ORAL | 0 refills | Status: DC | PRN

## 2023-10-10 NOTE — Discharge Instructions (Signed)
 Your urine dip was negative for signs of infection or blood in the urine.  Based on your symptoms and physical exam I believe the following is the cause of your concern today Back pain likely secondary to a strain of your back muscles  I recommend the following at this time to help relieve that discomfort:  Rest Warm compresses to the area (20 minutes on, minimum of 30 minutes off) You can alternate Tylenol  and Ibuprofen  for pain management but Ibuprofen  is typically preferred to reduce inflammation.   Gentle stretches and exercises that I have included in your paperwork Try to reduce excess strain to the area and rest as much as possible  Wear supportive shoes and, if you must lift anything, use proper lifting techniques that spare your back.   We have provided you with a Kenalog  40 mg steroid injection to assist with inflammation and hopefully reduce your pain. We have also provided you with a prescription for a muscle relaxer called Robaxin .  Please do not take this with any other sedating medications or other muscle relaxers as this can cause respiratory distress.  You can take this as needed up to every 8 hours for muscle spasms.  Please be aware that it can cause sedation and drowsiness so do not take it if you need to remain alert or drive.  If at any point you start to develop more severe pain, numbness or tingling in your extremities, weakness, inability to walk, fever or chills, incontinence please go to the emergency room as these could be signs of medical emergency.

## 2023-10-10 NOTE — ED Provider Notes (Addendum)
 [name]    CSN: [id number] Arrival date & time: 10/10/23  1307      History   Chief Complaint Chief Complaint  Patient presents with   Back Pain    HPI Sherry [name] is a 62 y.o. female.   HPI  She reports having low back pain for about 2 weeks She states while at work she is in excruciating pain  She reports she was previously managed by Pain Management for fibromyalgia but is no longer seeing them.  She is not sure if her current symptoms are related to her fibromyalgia versus a new condition/concern.  She denies incontinence, saddle anesthesia, numbness or tingling in lower extremities, injuries or trauma   She denies dysuria or increased frequency  Interventions: She denies taking anything for her symptoms at home.  Her daughter, who is here with her , states that she gave her Tylenol  and ibuprofen  but this has not provided much relief  Past Medical History:  Diagnosis Date   Acid reflux disease    Anginal pain (HCC)    last time 04/18/13 in afternoon; referred to Dr. Ola Berger   Anxiety    Cancer [facility])    uterine- had hysterectomy   Chest pain 04/07/2011   Chronic pain    Chronic pain 06/19/2014   Cough 09/25/2011   Depression    Fast heart beat    Fibromyalgia    Headache(784.0) 12/27/2012   History of blood transfusion 1981   childbirth   History of kidney stones    passed   Hypertension    Influenza A 07/08/2017   Left wrist pain 03/10/2013   Migraine    last one was 04/18/13   Sleep apnea    does not use CPAP   Trichomonas contact, treated     Patient Active Problem List   Diagnosis Date Noted   Right upper lobe pneumonia 08/11/2023   Acute pain of left shoulder 12/18/2022   Mixed hyperlipidemia 03/05/2022   Osteoarthritis of multiple joints 08/25/2021   Multiple thyroid  nodules 08/20/2020   Type 2 diabetes mellitus not at goal [facility]) 04/22/2020   Healthcare maintenance 01/26/2019   Insomnia 12/29/2018   Solitary pulmonary  nodule 08/03/2018   Depression 05/09/2018   Sleep apnea 05/18/2013   Lumbar facet arthropathy 03/10/2013   Metabolic syndrome 09/10/2011   Obesity 09/02/2011   Atypical chest pain 04/07/2011   Fibromyalgia 03/24/2011   Anxiety 03/24/2011   Essential hypertension 03/24/2011   GERD 01/05/2007    Past Surgical History:  Procedure Laterality Date   CHOLECYSTECTOMY     2008   ORIF CALCANEOUS FRACTURE Left 05/16/2013   Procedure: OPEN REDUCTION INTERNAL FIXATION (ORIF) LEFT CALCANEOUS FRACTURE;  Surgeon: Arlette Lagos, MD;  Location: [facility] OR;  Service: Orthopedics;  Laterality: Left;   TOTAL ABDOMINAL HYSTERECTOMY     1986   WISDOM TOOTH EXTRACTION      OB History     Gravida  3   Para  2   Term  2   Preterm      AB  1   Living  2      SAB  1   IAB      Ectopic      Multiple      Live Births  2            Home Medications    Prior to Admission medications   Medication Sig Start Date End Date Taking? Authorizing Provider  methocarbamol  (  ROBAXIN -750) 750 MG tablet Take 1 tablet (750 mg total) by mouth every 8 (eight) hours as needed for muscle spasms. 10/10/23  Yes Cydnie Deason E, PA-C  aspirin  EC 81 MG tablet Take 81 mg by mouth daily. Swallow whole.    [provider]  aspirin -acetaminophen -caffeine (EXCEDRIN MIGRAINE) 250-250-65 MG tablet Take 2 tablets by mouth every 6 (six) hours as needed for headache.    [provider]  Blood Glucose Monitoring Suppl DEVI 1 each by Does not apply route in the morning, at noon, and at bedtime. May substitute to any manufacturer covered by patient's insurance. Patient not taking: Reported on 10/05/2023 04/28/23   Dorthy Gavia, MD  busPIRone  (BUSPAR ) 7.5 MG tablet Take 1 tablet (7.5 mg total) by mouth 2 (two) times daily. 08/11/23   Ronni Colace, DO  cetirizine  (ZYRTEC  ALLERGY) 10 MG tablet Take 1 tablet (10 mg total) by mouth daily. 10/19/22   Rising, Ivette Marks, PA-C  diclofenac  Sodium (VOLTAREN ) 1 % GEL  Apply 2 grams topically 4 (four) times daily. 10/06/23   Malen Scudder, DO  docusate sodium  (COLACE) 100 MG capsule Take 1 capsule (100 mg total) by mouth every 12 (twelve) hours. 07/24/22   Malen Scudder, DO  empagliflozin  (JARDIANCE ) 10 MG TABS tablet Take 1 tablet (10 mg total) by mouth daily before breakfast. Patient not taking: Reported on 10/05/2023 04/30/23   McLendon, Michael, MD  hydrOXYzine  (ATARAX ) 10 MG tablet Take 1 tablet (10 mg total) by mouth 3 (three) times daily as needed. Patient not taking: Reported on 10/05/2023 04/16/23   Nooruddin, Saad, MD  lisinopril -hydrochlorothiazide  (ZESTORETIC ) 20-25 MG tablet Take 1 tablet by mouth daily. 10/26/22 11/06/23  Jonelle Neri, DO  meloxicam  (MOBIC ) 15 MG tablet Take 1 tablet (15 mg total) by mouth daily. Patient not taking: Reported on 07/05/2023 11/27/22   Adolph Hoop, PA-C  oxyCODONE -acetaminophen  (PERCOCET) 5-325 MG tablet Take 1 tablet by mouth 2 (two) times daily as needed for severe pain. Patient not taking: Reported on 10/05/2023 07/05/23   Jodi Munroe, NP  pantoprazole  (PROTONIX ) 40 MG tablet Take 1 tablet (40 mg total) by mouth 2 (two) times daily. 03/19/23   Malen Scudder, DO  polyethylene glycol powder (MIRALAX ) 17 GM/SCOOP powder Take 1 capful (17 g total) and mix in 8 oz of clear liquid and drink by mouth daily as needed for moderate constipation or severe constipation. 07/24/22   Malen Scudder, DO  promethazine  (PHENERGAN ) 12.5 MG tablet Take 1 tablet (12.5 mg total) by mouth every 8 (eight) hours as needed for nausea or vomiting. 04/28/23   Dorthy Gavia, MD  promethazine  (PHENERGAN ) 12.5 MG tablet Take 1 tablet (12.5 mg total) by mouth every 8 (eight) hours as needed for nausea or vomiting. 08/17/23 10/14/23  Malen Scudder, DO  Semaglutide ,0.25 or 0.5MG /DOS, 2 MG/3ML SOPN Inject 0.25 mg into the skin once a week. Patient not taking: Reported on 10/05/2023 08/11/23   Ronni Colace, DO  simvastatin  (ZOCOR ) 40 MG tablet Take 1 tablet (40 mg total) by  mouth at bedtime. 03/19/23   Malen Scudder, DO  venlafaxine  XR (EFFEXOR -XR) 37.5 MG 24 hr capsule Take 1 capsule (37.5 mg total) by mouth daily with breakfast. 10/05/23   Sherry [name] Joiner, MD  Vitamin D , Ergocalciferol , (DRISDOL ) 1.25 MG (50000 UNIT) CAPS capsule Take 1 capsule (50,000 Units total) by mouth every 7 (seven) days. Patient not taking: Reported on 10/05/2023 07/15/23   Liam Redhead, MD    Family History Family History  Problem Relation Age of  Onset   Diabetes Mother    Hypertension Mother    Depression Mother    Diabetes Sister    Hypertension Father    Cancer Father        Prostate   Alcohol abuse Father    Cancer Brother        Prostate   Breast cancer Neg Hx    Colon cancer Neg Hx    Colon polyps Neg Hx    Esophageal cancer Neg Hx    Stomach cancer Neg Hx    Rectal cancer Neg Hx     Social History Social History   Tobacco Use   Smoking status: Never   Smokeless tobacco: Never  Vaping Use   Vaping status: Never Used  Substance Use Topics   Alcohol use: No   Drug use: No     Allergies   Duloxetine , Duloxetine  hcl, Pregabalin, and Gabapentin    Review of Systems Review of Systems  Musculoskeletal:  Positive for back pain and gait problem.  Neurological:  Negative for tremors, weakness and numbness.     Physical Exam Triage Vital Signs ED Triage Vitals  Encounter Vitals Group     BP 10/10/23 1324 125/85     Systolic BP Percentile --      Diastolic BP Percentile --      Pulse Rate 10/10/23 1324 81     Resp 10/10/23 1324 16     Temp 10/10/23 1324 98.2 F (36.8 C)     Temp Source 10/10/23 1324 Temporal     SpO2 10/10/23 1324 97 %     Weight 10/10/23 1324 160 lb (72.6 kg)     Height 10/10/23 1324 5' (1.524 m)     Head Circumference --      Peak Flow --      Pain Score 10/10/23 1350 10     Pain Loc --      Pain Education --      Exclude from Growth Chart --    No data found.  Updated Vital Signs BP 125/85 (BP Location: Right Arm)    Pulse 81   Temp 98.2 F (36.8 C) (Temporal)   Resp 16   Ht 5' (1.524 m)   Wt 160 lb (72.6 kg)   SpO2 97%   BMI 31.25 kg/m   Visual Acuity Right Eye Distance:   Left Eye Distance:   Bilateral Distance:    Right Eye Near:   Left Eye Near:    Bilateral Near:     Physical Exam Vitals reviewed.  Constitutional:      General: She is awake.     Appearance: Normal appearance. She is well-developed and well-groomed.  HENT:     Head: Normocephalic and atraumatic.  Eyes:     General: Lids are normal. Gaze aligned appropriately.     Extraocular Movements: Extraocular movements intact.     Conjunctiva/sclera: Conjunctivae normal.  Pulmonary:     Effort: Pulmonary effort is normal.  Musculoskeletal:     Thoracic back: Swelling and spasms present. Decreased range of motion.     Lumbar back: Swelling, spasms and tenderness present. Decreased range of motion.     Comments: There are notable spasms along paraspinal muscles and lower thoracic and lumbar areas. Patient has difficulty with lateral rotation particularly towards the right side. I was unable to further assess thoracic range of motion, lumbar range of motion as patient reported significant pain and declined to complete further assessment.   Neurological:  General: No focal deficit present.     Mental Status: She is alert and oriented to person, place, and time.     GCS: GCS eye subscore is 4. GCS verbal subscore is 5. GCS motor subscore is 6.     Cranial Nerves: No cranial nerve deficit, dysarthria or facial asymmetry.  Psychiatric:        Attention and Perception: Attention and perception normal.        Mood and Affect: Mood and affect normal.        Speech: Speech normal.        Behavior: Behavior normal. Behavior is cooperative.        Thought Content: Thought content normal.        Judgment: Judgment normal.      UC Treatments / Results  Labs (all labs ordered are listed, but only abnormal results are  displayed) Labs Reviewed  POCT URINALYSIS DIP (MANUAL ENTRY) - Abnormal; Notable for the following components:      Result Value   Protein Ur, POC =30 (*)    All other components within normal limits    EKG   Radiology No results found.  Procedures Procedures (including critical care time)  Medications Ordered in UC Medications  triamcinolone  acetonide (KENALOG -40) injection 40 mg (40 mg Intramuscular Given 10/10/23 1442)    Initial Impression / Assessment and Plan / UC Course  I have reviewed the triage vital signs and the nursing notes.  Pertinent labs & imaging results that were available during my care of the patient were reviewed by me and considered in my medical decision making (see chart for details).      Final Clinical Impressions(s) / UC Diagnoses   Final diagnoses:  Acute bilateral low back pain without sciatica   Patient presents today with acute bilateral low back pain without notable sciatica.  She reports that this has been ongoing for about 2 weeks and denies taking medications at home.  Her daughter states that she did provide her with over-the-counter medications for pain relief but these did not provide notable benefit.  Difficult to fully determine range of motion deficits due to patient discomfort and lack of participation with ROM assessment.  She does have notable spasms along paraspinal muscles bilaterally as well as mild swelling.  At this time suspect muscular etiology.  Will provide Kenalog  40 mg injection to assist with inflammation.  Recommend alternating Tylenol  and ibuprofen  at home.  Will send her a prescription in for Robaxin  to assist with rest and provide some muscle relaxation.  Reviewed that she should not take this if she needs to drive or remain alert due to sedation.  Reviewed that she should not take this with other sedating medications due to compounded effects.  Recommend close follow-up with primary care provider and specialists for  ongoing management.  ED and return precautions reviewed and provided in after visit summary.  Follow-up as needed.    Discharge Instructions      Your urine dip was negative for signs of infection or blood in the urine.  Based on your symptoms and physical exam I believe the following is the cause of your concern today Back pain likely secondary to a strain of your back muscles  I recommend the following at this time to help relieve that discomfort:  Rest Warm compresses to the area (20 minutes on, minimum of 30 minutes off) You can alternate Tylenol  and Ibuprofen  for pain management but Ibuprofen  is typically preferred to reduce  inflammation.   Gentle stretches and exercises that I have included in your paperwork Try to reduce excess strain to the area and rest as much as possible  Wear supportive shoes and, if you must lift anything, use proper lifting techniques that spare your back.   We have provided you with a Kenalog  40 mg steroid injection to assist with inflammation and hopefully reduce your pain. We have also provided you with a prescription for a muscle relaxer called Robaxin .  Please do not take this with any other sedating medications or other muscle relaxers as this can cause respiratory distress.  You can take this as needed up to every 8 hours for muscle spasms.  Please be aware that it can cause sedation and drowsiness so do not take it if you need to remain alert or drive.  If at any point you start to develop more severe pain, numbness or tingling in your extremities, weakness, inability to walk, fever or chills, incontinence please go to the emergency room as these could be signs of medical emergency.      ED Prescriptions     Medication Sig Dispense Auth. Provider   methocarbamol  (ROBAXIN -750) 750 MG tablet Take 1 tablet (750 mg total) by mouth every 8 (eight) hours as needed for muscle spasms. 30 tablet Kazim Corrales E, PA-C      PDMP not reviewed this  encounter.   Jerona Mooring, PA-C 10/10/23 1503    Elia Nunley E, PA-C 10/10/23 1505

## 2023-10-10 NOTE — ED Triage Notes (Signed)
 Pt presents with complaints of lower back pain x 2 weeks. Pt describes the pain as constant and aching. Pt currently rates her overall pain a 10/10. OTC Tylenol  taken + lidocaine  pad applied with no relief. Pt has noticed her urine is dark in color, unsure if she has a UTI. Denies burning with urination.

## 2023-10-11 ENCOUNTER — Telehealth: Payer: Self-pay | Admitting: *Deleted

## 2023-10-11 ENCOUNTER — Ambulatory Visit (INDEPENDENT_AMBULATORY_CARE_PROVIDER_SITE_OTHER): Admitting: Internal Medicine

## 2023-10-11 DIAGNOSIS — E119 Type 2 diabetes mellitus without complications: Secondary | ICD-10-CM | POA: Diagnosis not present

## 2023-10-11 DIAGNOSIS — Z7984 Long term (current) use of oral hypoglycemic drugs: Secondary | ICD-10-CM | POA: Diagnosis not present

## 2023-10-11 NOTE — Telephone Encounter (Signed)
 CommunicationReason for CRM: Patient is calling because she is having some back pain and she is wanting to know if she needs to come in or can provider just send her a rx for it, she thinks she is having a UTI but not sure, patient did go to the ER on yesterday and received a shot in her back for the pain, patient callback number [phone]     I called pt who stated she has been having severe back pain ; she went to [facility] yesterday and received an injection. Stated she has not found a pain management center yet. Stated UC told her she did not have an UTI. Requesting something for the pain. 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 Telehealth scheduled with Dr Meredeth Stallion this am @ 1015AM.

## 2023-10-11 NOTE — Progress Notes (Unsigned)
  [facility] Internal Medicine Residency Telephone Encounter Continuity Care Appointment  HPI:  This telephone encounter was created for [name] on 10/12/2023 for the following purpose/cc medication and referral questions.   Past Medical History:  Past Medical History:  Diagnosis Date   Acid reflux disease    Anginal pain (HCC)    last time 04/18/13 in afternoon; referred to Dr. Ola Berger   Anxiety    Cancer [facility])    uterine- had hysterectomy   Chest pain 04/07/2011   Chronic pain    Chronic pain 06/19/2014   Cough 09/25/2011   Depression    Fast heart beat    Fibromyalgia    Headache(784.0) 12/27/2012   History of blood transfusion 1981   childbirth   History of kidney stones    passed   Hypertension    Influenza A 07/08/2017   Left wrist pain 03/10/2013   Migraine    last one was 04/18/13   Sleep apnea    does not use CPAP   Trichomonas contact, treated      ROS:  Negative unless stated in assessment and plan.   Assessment / Plan / Recommendations:  Please see A&P under problem oriented charting for assessment of the patient's acute and chronic medical conditions.  As always, pt is advised that if symptoms worsen or new symptoms arise, they should go to an urgent care facility or to to ER for further evaluation.   Consent and Medical Decision Making:  Patient discussed with Dr. Lelia Putnam This is a telephone encounter between Sherry [name] and Sherry [name] on 10/12/2023 for 15 minutes. The visit was conducted with the patient located at home and Sherry [name] at [facility]. The patient's identity was confirmed using their DOB and current address. The patient has consented to being evaluated through a telephone encounter and understands the associated risks (an examination cannot be done and the patient may need to come in for an appointment) / benefits (allows the patient to remain at home, decreasing exposure to coronavirus). I personally spent 15 minutes on medical  discussion.     Pt called to get an update regarding a previous referral that had been placed for her for pain management.  It appears that she has an in network provider for pain management so I will provide her the contact information for that provider.  Patient also requested information about her Ozempic  that was previously prescribed to her as she had not heard anything regarding that.  After reviewing her chart it appears patient needs to try metformin for at least 3 months and fail that treatment before she can be approved for a GLP-1 agonist. Discussed with patient and will start her on Metformin 500 mg every day.   Type 2 diabetes mellitus not at goal [facility]) Pt's Ozempic  is not getting approved as pt has not tried metformin.  Discussed with patient about trying metformin for her diabetes.  Initially patient was hesitant due to concern for side effects but advised that it is a safe drug and we can try a low-dose to see how she tolerates this.  Will proceed with the extended release formulation.  If she is unable to tolerate this, then we can proceed with Ozempic .

## 2023-10-12 MED ORDER — METFORMIN HCL ER 500 MG PO TB24: 500.0000 mg | ORAL_TABLET | Freq: Every day | ORAL | 2 refills | Status: DC

## 2023-10-12 NOTE — Progress Notes (Signed)
 Internal Medicine Clinic Attending  Case discussed with the resident at the time of the visit.  We reviewed the resident's history and exam and pertinent patient test results.  I agree with the assessment, diagnosis, and plan of care documented in the resident's note.

## 2023-10-12 NOTE — Assessment & Plan Note (Signed)
 Pt's Ozempic  is not getting approved as pt has not tried metformin.  Discussed with patient about trying metformin for her diabetes.  Initially patient was hesitant due to concern for side effects but advised that it is a safe drug and we can try a low-dose to see how she tolerates this.  Will proceed with the extended release formulation.  If she is unable to tolerate this, then we can proceed with Ozempic .

## 2023-10-18 ENCOUNTER — Telehealth: Payer: Self-pay | Admitting: *Deleted

## 2023-10-18 NOTE — Telephone Encounter (Signed)
 Will forward to C. Boone. Copied from CRM [id number]. Topic: General - Other >> Oct 18, 2023  3:32 PM Blair Bumpers wrote: Reason for CRM: Patient called stating that Dr. Meredeth Stallion told her that he was sending her to a pain management doctor. She stated he told her the clinic's name, provider, & information would be in her AVS but there is nothing there with this information. Can a nurse or provider please give patient a call to provide her with this information? CB #: [phone].

## 2023-10-20 ENCOUNTER — Ambulatory Visit: Admitting: Cardiology

## 2023-11-06 NOTE — Progress Notes (Unsigned)
 Cardiology Office Note:  .   Date:  11/11/2023  ID:  Sherry [name], DOB 01/13/1961, MRN [id number] PCP: Malen Scudder, DO  [facility] Providers Cardiologist:  Randene Bustard, MD     Chief Complaint  Patient presents with   New Patient (Initial Visit)    Chest Pains; occasional heart fluttering     Patient Profile: Sherry [name] is an obese 63 y.o. female  with a PMH notable for HTN, HLD, DM-2 (Metabolic Syndrome) along with fibromyalgia (with chronic pain syndrome) and OSA-not on CPAP who presents here for "Chest Pain "at the request of Priscella Brooms, DO.  She is in the process of prior authorization for initiation of GLP-1 agonist for weight loss and diabetes.      Sherry [name] was seen on 08/11/2023 at the [facility] by Ronni Colace, DO (R1) with complaints of substernal chest pain occurring nearly every day lasting for minutes to hours.  Not exertional and nonpleuritic.  Occasionally radiates to left shoulder but not associate with dyspnea.  Prophy low risk for ACS.  Referred to Cardiology.She was also referred to pain clinic for chronic pain  Subjective  Discussed the use of AI scribe software for clinical note transcription with the patient, who gave verbal consent to proceed.  History of Present Illness Sherry [name] is a 63 year old female with hypertension, hyperlipidemia, and diabetes who presents with chest pain.  She experiences sharp chest pains primarily in the left upper chest area, occurring sporadically two to three times a week, both at rest and during activity. The pain lasts about a minute and requires her to stop and rest until it subsides. No exacerbation with physical exertion is noted.  She experiences dizziness described as a sensation of the world spinning, occurring once or twice a week while sitting or standing, sometimes accompanied by heart flutters. The dizziness lasts only a few seconds, and she  occasionally feels like she might pass out, needing to grab onto something for support.  She reports constant fatigue, weakness, and occasional nausea, attributing these symptoms to her ongoing health issues. No shortness of breath, clamminess, or swelling in the legs is noted, except for one foot that swells due to a previous fracture, particularly after prolonged standing or during rainy weather.  Her family history is significant for heart disease, with her father having an irregular heartbeat and her grandmother having had a heart attack in her seventies. She also mentions uncles with heart conditions, including one who has had stents placed.  Her current medications include lisinopril  for hypertension, metformin  for diabetes, and simvastatin  for hyperlipidemia. She is not currently taking any additional medications for diabetes management due to insurance issues with obtaining injectable treatments.     Objective   Current Meds  Medication Sig   aspirin  EC 81 MG tablet Take 81 mg by mouth daily. Swallow whole.   aspirin -acetaminophen -caffeine (EXCEDRIN MIGRAINE) 250-250-65 MG tablet Take 2 tablets by mouth every 6 (six) hours as needed for headache.   busPIRone  (BUSPAR ) 7.5 MG tablet Take 1 tablet (7.5 mg total) by mouth 2 (two) times daily.   cetirizine  (ZYRTEC  ALLERGY) 10 MG tablet Take 1 tablet (10 mg total) by mouth daily.   diclofenac  Sodium (VOLTAREN ) 1 % GEL Apply 2 grams topically 4 (four) times daily.   docusate sodium  (COLACE) 100 MG capsule Take 1 capsule (100 mg total) by mouth every 12 (twelve) hours.  hydrOXYzine  (ATARAX ) 10 MG tablet Take 1 tablet (10 mg total) by mouth 3 (three) times daily as needed.   meloxicam  (MOBIC ) 15 MG tablet Take 1 tablet (15 mg total) by mouth daily.   metFORMIN  (GLUCOPHAGE -XR) 500 MG 24 hr tablet Take 1 tablet (500 mg total) by mouth daily with breakfast.   pantoprazole  (PROTONIX ) 40 MG tablet Take 1 tablet (40 mg total) by mouth 2 (two) times  daily.   polyethylene glycol powder (MIRALAX ) 17 GM/SCOOP powder Take 1 capful (17 g total) and mix in 8 oz of clear liquid and drink by mouth daily as needed for moderate constipation or severe constipation.   promethazine  (PHENERGAN ) 12.5 MG tablet Take 1 tablet (12.5 mg total) by mouth every 8 (eight) hours as needed for nausea or vomiting.   simvastatin  (ZOCOR ) 40 MG tablet Take 1 tablet (40 mg total) by mouth at bedtime.  She says that she is still taking lisinopril - hydrochlorothiazide  20-12.5 She has Jardiance  ordered but not taking.  Family History - Heart problems - Grandmother had a heart attack - Father has heart problems - Uncle has heart problems - Patient's mother does not have heart problems - Patient's sister does not have heart problems - Patient's uncle has stents - Patient's father has stents  Studies Reviewed: Aaron Aas   EKG Interpretation Date/Time:  Tuesday November 09 2023 08:44:30 EDT Ventricular Rate:  86 PR Interval:  120 QRS Duration:  122 QT Interval:  406 QTC Calculation: 485 R Axis:   19  Text Interpretation: Normal sinus rhythm Right bundle branch block When compared with ECG of 05-Aug-2022 16:24, No significant change was found Confirmed by Randene Bustard ([id number]) on 11/09/2023 8:57:00 AM    ECHO 04/26/2013: Normal LV size and function EF is 60 to 65%.  No RWMA.  Normal valves.  Labs reviewed-most recent lipid panel was from December 2023 with LDL 105 and TC of 163.  TG 77. February 2025: Cr 0.82, A1c 6.8  Risk Assessment/Calculations:          Physical Exam:   VS:  BP 126/88 (BP Location: Right Arm, Patient Position: Sitting, Cuff Size: Normal)   Pulse 82   Ht 5' (1.524 m)   Wt 158 lb (71.7 kg)   SpO2 92%   BMI 30.86 kg/m    Wt Readings from Last 3 Encounters:  11/09/23 158 lb (71.7 kg)  10/10/23 160 lb (72.6 kg)  10/05/23 160 lb 12.8 oz (72.9 kg)    GEN: Well nourished, well developed in no acute distress; healthy appearing NECK: No JVD; No  carotid bruits CARDIAC: Normal S1, S2; RRR, no murmurs, rubs, gallops RESPIRATORY:  Clear to auscultation without rales, wheezing or rhonchi ; nonlabored, good air movement. ABDOMEN: Soft, non-tender, non-distended EXTREMITIES:  No edema; No deformity      ASSESSMENT AND PLAN: .    Problem List Items Addressed This Visit       Cardiology Problems   Essential hypertension (Chronic)   Hypertension well managed with lisinopril -hydrochlorothiazide  (20-12.5 mg)       Relevant Orders   EKG 12-Lead (Completed)   CT CORONARY MORPH W/CTA COR W/SCORE W/CA W/CM &/OR WO/CM   Basic metabolic panel with GFR   Hyperlipidemia associated with type 2 diabetes mellitus (HCC) (Chronic)   Lipid and glycemic control important for cardiovascular risk reduction. Hyperlipidemia managed with simvastatin  40 mg. Type 2 diabetes managed with metformin  XR 500 mg,  And Semaglutide .  Not currently taking empagliflozin . -Most recent A1c 6.8 (February 2025)  and LDL 103 (December 2023)  Defer lab evaluation to PCP at present.         Other   Fibromyalgia (Chronic)   Most likely the etiology for her CP - but will excluded ischemic CAD with Cor CTA      Relevant Orders   CT CORONARY MORPH W/CTA COR W/SCORE W/CA W/CM &/OR WO/CM   Basic metabolic panel with GFR   LONG TERM MONITOR (3-14 DAYS)   Metabolic syndrome (Chronic)   Hypertension, hyperlipidemia and DM-2 along with obesity. Increases cardiovascular risk.  Risk assessment with Coronary CTA which also provides additional data on coronary anatomy and plaque burden.      Relevant Orders   CT CORONARY MORPH W/CTA COR W/SCORE W/CA W/CM &/OR WO/CM   Basic metabolic panel with GFR   Periodic heart flutter   Intermittent dizziness with vertigo and near-syncope. Possible heart rhythm abnormalities. Discussed cardiac monitor for arrhythmia assessment. - Order two-week cardiac monitor.(Zio)      Relevant Orders   CT CORONARY MORPH W/CTA COR W/SCORE W/CA  W/CM &/OR WO/CM   Basic metabolic panel with GFR   LONG TERM MONITOR (3-14 DAYS)   Precordial pain - Primary   Atypical chest pain. Intermittent sharp chest pain at rest and during activity.  Given risk factors and family history of CAD, differential includes coronary artery disease.  Coronary CT angiogram is definitive for evaluation. - Order Coronary CT Angiogram. - Check kidney function prior to CT scan.  (Chemistry panel)      Relevant Orders   CT CORONARY MORPH W/CTA COR W/SCORE W/CA W/CM &/OR WO/CM   Basic metabolic panel with GFR   Type 2 diabetes mellitus not at goal [facility]) (Chronic)   Relevant Orders   CT CORONARY MORPH W/CTA COR W/SCORE W/CA W/CM &/OR WO/CM   Basic metabolic panel with GFR          Follow-Up: Return in about 2 months (around 01/09/2024).      Signed, Arleen Lacer, MD, MS Randene Bustard, M.D., M.S. Interventional Chartered certified accountant  Pager # [phone]

## 2023-11-08 ENCOUNTER — Ambulatory Visit: Admitting: Podiatry

## 2023-11-09 ENCOUNTER — Other Ambulatory Visit (HOSPITAL_COMMUNITY): Payer: Self-pay

## 2023-11-09 ENCOUNTER — Ambulatory Visit: Attending: Cardiology

## 2023-11-09 ENCOUNTER — Encounter: Payer: Self-pay | Admitting: Cardiology

## 2023-11-09 ENCOUNTER — Ambulatory Visit: Attending: Cardiology | Admitting: Cardiology

## 2023-11-09 VITALS — BP 126/88 | HR 82 | Ht 60.0 in | Wt 158.0 lb

## 2023-11-09 DIAGNOSIS — M797 Fibromyalgia: Secondary | ICD-10-CM

## 2023-11-09 DIAGNOSIS — E1169 Type 2 diabetes mellitus with other specified complication: Secondary | ICD-10-CM

## 2023-11-09 DIAGNOSIS — I10 Essential (primary) hypertension: Secondary | ICD-10-CM

## 2023-11-09 DIAGNOSIS — I498 Other specified cardiac arrhythmias: Secondary | ICD-10-CM | POA: Diagnosis not present

## 2023-11-09 DIAGNOSIS — R072 Precordial pain: Secondary | ICD-10-CM | POA: Diagnosis not present

## 2023-11-09 DIAGNOSIS — E785 Hyperlipidemia, unspecified: Secondary | ICD-10-CM

## 2023-11-09 DIAGNOSIS — G4733 Obstructive sleep apnea (adult) (pediatric): Secondary | ICD-10-CM

## 2023-11-09 DIAGNOSIS — E782 Mixed hyperlipidemia: Secondary | ICD-10-CM

## 2023-11-09 DIAGNOSIS — E8881 Metabolic syndrome: Secondary | ICD-10-CM

## 2023-11-09 DIAGNOSIS — E119 Type 2 diabetes mellitus without complications: Secondary | ICD-10-CM | POA: Diagnosis not present

## 2023-11-09 DIAGNOSIS — Z6833 Body mass index (BMI) 33.0-33.9, adult: Secondary | ICD-10-CM

## 2023-11-09 DIAGNOSIS — R0789 Other chest pain: Secondary | ICD-10-CM

## 2023-11-09 MED ORDER — METOPROLOL TARTRATE 100 MG PO TABS
100.0000 mg | ORAL_TABLET | Freq: Once | ORAL | 0 refills | Status: DC
  Filled 2023-11-09: qty 1, 1d supply, fill #0

## 2023-11-09 NOTE — Progress Notes (Unsigned)
 Enrolled patient for a 14 day Zio XT  monitor to be mailed to patients home

## 2023-11-09 NOTE — Patient Instructions (Addendum)
 Medication Instructions:  See instruction Below - Metoprolol   *If you need a refill on your cardiac medications before your next appointment, please call your pharmacy*   Lab Work: BMP today  -at [facility] - downstairs on the first floor  If you have labs (blood work) drawn today and your tests are completely normal, you will receive your results only by: MyChart Message (if you have MyChart) OR A paper copy in the mail If you have any lab test that is abnormal or we need to change your treatment, we will call you to review the results.   Testing/Procedures: 1) Will be schedule  Your physician has requested that you have coronary  CTA. Coronary computed tomography (CT)angiogram  is a special type of CT scan that uses a computer to produce multi-dimensional views of major blood vessels throughout the heart.  CT angiography, a contrast material is injected through an IV to help visualize the blood vessels  a painless test that uses an x-ray machine to take clear, detailed pictures of your heart arteries .  Please follow instruction sheet as given.   And  2)  Will be mailed to your home in 3 to 7 days Your physician has recommended that you wear a holter monitor - 14 days Zio. Holter monitors are medical devices that record the heart's electrical activity. Doctors most often use these monitors to diagnose arrhythmias. Arrhythmias are problems with the speed or rhythm of the heartbeat. The monitor is a small, portable device. You can wear one while you do your normal daily activities. This is usually used to diagnose what is causing palpitations/syncope (passing out).  Follow-Up: At [facility], you and your health needs are our priority.  As part of our continuing mission to provide you with exceptional heart care, we have created designated Provider Care Teams.  These Care Teams include your primary Cardiologist (physician) and Advanced Practice Providers (APPs -  Physician Assistants and  Nurse Practitioners) who all work together to provide you with the care you need, when you need it.     Your next appointment:   2 month(s)  The format for your next appointment:   In Person  Provider:   Randene Bustard, MD   Other Instructions     Your cardiac CT will be scheduled at one of the below locations:   [facility] [address] [place], [place] [address] [phone]  OR  [facility] [address] [place], [place] [address] [phone]  OR   [facility] [address] [place], [place] [address] [phone]  OR   [facility] [address] [place], [place] [address] [phone]  OR   [facility] and Vascular Tower [address]  [place], [place] [address] Opening October 04, 2023  If scheduled at [facility], please arrive at the [facility] and Children's Entrance (Entrance C2) of [facility] 30 minutes prior to test start time. You can use the FREE valet parking offered at entrance C (encouraged to control the heart rate for the test)  Proceed to the [facility] Radiology Department (first floor) to check-in and test prep.   All radiology patients and guests should use entrance C2 at [facility], accessed from [facility], even though the hospital's physical address listed is [address]    If scheduled at the Heart and Vascular Tower at [facility],  please enter the parking lot using the [address] entrance and use the FREE valet service at the patient drop-off area. Enter the buidling and check-in with registration on the main floor.  If scheduled at [facility] or [facility], please arrive 15 mins early for check-in and test prep.  There is spacious parking and easy access to the radiology department from the [facility] Heart and  Vascular entrance. Please enter here and check-in with the desk attendant.   If scheduled at [facility], please arrive 30 minutes early for check-in and test prep.  Please follow these instructions carefully (unless otherwise directed):  An IV will be required for this test and Nitroglycerin will be given.    On the Night Before the Test: Be sure to Drink plenty of water. Do not consume any caffeinated/decaffeinated beverages or chocolate 12 hours prior to your test. Do not take any antihistamines 12 hours prior to your test.   On the Day of the Test: Drink plenty of water until 1 hour prior to the test. Do not eat any food 1 hour prior to test. You may take your regular medications prior to the test.  Take metoprolol  tartrate (Lopressor) 100 mg  two hours prior to test. If you take Lisinopril  /Hydrochlorothiazide , please HOLD on the morning of the test. Patients who wear a continuous glucose monitor MUST remove the device prior to scanning. FEMALES- please wear underwire-free bra if available, avoid dresses & tight clothing        After the Test: Drink plenty of water. After receiving IV contrast, you may experience a mild flushed feeling. This is normal. On occasion, you may experience a mild rash up to 24 hours after the test. This is not dangerous. If this occurs, you can take Benadryl  25 mg, Zyrtec , Claritin, or Allegra and increase your fluid intake. (Patients taking Tikosyn should avoid Benadryl , and may take Zyrtec , Claritin, or Allegra) If you experience trouble breathing, this can be serious. If it is severe call 911 IMMEDIATELY. If it is mild, please call our office.  We will call to schedule your test 2-4 weeks out understanding that some insurance companies will need an authorization prior to the service being performed.   For more information and frequently asked questions, please visit our website : [email or web address]  For non-scheduling  related questions, please contact the cardiac imaging nurse navigator should you have any questions/concerns: Cardiac Imaging Nurse Navigators Direct Office Dial: [phone]   For scheduling needs, including cancellations and rescheduling, please call [place], [phone].     ZIO XT- Long Term Monitor Instructions  Your physician has requested you wear a ZIO patch monitor for 14 days.  This is a single patch monitor. [employer] supplies one patch monitor per enrollment. Additional stickers are not available. Please do not apply patch if you will be having a Nuclear Stress Test,  Echocardiogram, Cardiac CT, MRI, or Chest Xray during the period you would be wearing the  monitor. The patch cannot be worn during these tests. You cannot remove and re-apply the  ZIO XT patch monitor.  Your ZIO patch monitor will be mailed 3 day USPS to your address on file. It may take 3-5 days  to receive your monitor after you have been enrolled.  Once you have received your monitor, please review the enclosed instructions. Your monitor  has already been registered assigning a specific monitor serial # to you.  Billing and Patient Assistance Program Information  We have supplied [employer] with any of your insurance information on file for billing purposes. [employer] offers a sliding scale Patient Assistance Program for patients that do not have  insurance, or whose insurance does not completely cover the cost of the ZIO monitor.  You must apply for the Patient Assistance Program to qualify for this discounted rate.  To apply, please call [employer] at [phone], select option 4, select option 2, ask to apply for  Patient Assistance Program. Sanna Crystal will ask your household income, and how many people  are in your household. They will quote your out-of-pocket cost based on that information.  [employer] will also be able to set up a 15-month, interest-free payment plan if needed.  Applying the monitor   Shave  hair from upper left chest.  Hold abrader disc by orange tab. Rub abrader in 40 strokes over the upper left chest as  indicated in your monitor instructions.  Clean area with 4 enclosed alcohol pads. Let dry.  Apply patch as indicated in monitor instructions. Patch will be placed under collarbone on left  side of chest with arrow pointing upward.  Rub patch adhesive wings for 2 minutes. Remove white label marked "1". Remove the white  label marked "2". Rub patch adhesive wings for 2 additional minutes.  While looking in a mirror, press and release button in center of patch. A small green light will  flash 3-4 times. This will be your only indicator that the monitor has been turned on.  Do not shower for the first 24 hours. You may shower after the first 24 hours.  Press the button if you feel a symptom. You will hear a small click. Record Date, Time and  Symptom in the Patient Logbook.  When you are ready to remove the patch, follow instructions on the last 2 pages of Patient  Logbook. Stick patch monitor onto the last page of Patient Logbook.  Place Patient Logbook in the blue and white box. Use locking tab on box and tape box closed  securely. The blue and white box has prepaid postage on it. Please place it in the mailbox as  soon as possible. Your physician should have your test results approximately 7 days after the  monitor has been mailed back to [facility].  Call [facility] Customer Care at [phone] if you have questions regarding  your ZIO XT patch monitor. Call them immediately if you see an orange light blinking on your  monitor.  If your monitor falls off in less than 4 days, contact our Monitor department at [phone].  If your monitor becomes loose or falls off after 4 days call [employer] at [phone] for  suggestions on securing your monitor

## 2023-11-11 ENCOUNTER — Encounter: Payer: Self-pay | Admitting: Cardiology

## 2023-11-11 DIAGNOSIS — R072 Precordial pain: Secondary | ICD-10-CM | POA: Insufficient documentation

## 2023-11-11 NOTE — Assessment & Plan Note (Signed)
 Most likely the etiology for her CP - but will excluded ischemic CAD with Cor CTA

## 2023-11-11 NOTE — Assessment & Plan Note (Signed)
 Hypertension, hyperlipidemia and DM-2 along with obesity. Increases cardiovascular risk.  Risk assessment with Coronary CTA which also provides additional data on coronary anatomy and plaque burden.

## 2023-11-11 NOTE — Assessment & Plan Note (Signed)
 Intermittent dizziness with vertigo and near-syncope. Possible heart rhythm abnormalities. Discussed cardiac monitor for arrhythmia assessment. - Order two-week cardiac monitor.(Zio)

## 2023-11-11 NOTE — Assessment & Plan Note (Addendum)
 Hypertension well managed with lisinopril -hydrochlorothiazide  (20-12.5 mg)

## 2023-11-11 NOTE — Assessment & Plan Note (Signed)
 Lipid and glycemic control important for cardiovascular risk reduction. Hyperlipidemia managed with simvastatin  40 mg. Type 2 diabetes managed with metformin  XR 500 mg,  And Semaglutide .  Not currently taking empagliflozin . -Most recent A1c 6.8 (February 2025) and LDL 103 (December 2023)  Defer lab evaluation to PCP at present.

## 2023-11-11 NOTE — Assessment & Plan Note (Addendum)
 Atypical chest pain. Intermittent sharp chest pain at rest and during activity.  Given risk factors and family history of CAD, differential includes coronary artery disease.  Coronary CT angiogram is definitive for evaluation. - Order Coronary CT Angiogram. - Check kidney function prior to CT scan.  (Chemistry panel)

## 2023-11-15 ENCOUNTER — Ambulatory Visit: Admitting: Obstetrics

## 2023-11-22 ENCOUNTER — Ambulatory Visit (HOSPITAL_COMMUNITY)

## 2023-11-25 ENCOUNTER — Other Ambulatory Visit: Payer: Self-pay | Admitting: Cardiology

## 2023-11-25 ENCOUNTER — Other Ambulatory Visit: Payer: Self-pay

## 2023-11-25 ENCOUNTER — Other Ambulatory Visit: Payer: Self-pay | Admitting: Internal Medicine

## 2023-11-25 ENCOUNTER — Other Ambulatory Visit (HOSPITAL_COMMUNITY): Payer: Self-pay

## 2023-11-25 ENCOUNTER — Other Ambulatory Visit: Payer: Self-pay | Admitting: Student

## 2023-11-25 DIAGNOSIS — M797 Fibromyalgia: Secondary | ICD-10-CM

## 2023-11-25 DIAGNOSIS — K219 Gastro-esophageal reflux disease without esophagitis: Secondary | ICD-10-CM

## 2023-11-25 DIAGNOSIS — I10 Essential (primary) hypertension: Secondary | ICD-10-CM

## 2023-11-25 LAB — HM DIABETES EYE EXAM

## 2023-11-25 MED ORDER — DICLOFENAC SODIUM 1 % EX GEL
2.0000 g | Freq: Four times a day (QID) | CUTANEOUS | 0 refills | Status: DC
  Filled 2023-11-25: qty 100, 12d supply, fill #0

## 2023-11-25 MED ORDER — PANTOPRAZOLE SODIUM 40 MG PO TBEC
40.0000 mg | DELAYED_RELEASE_TABLET | Freq: Two times a day (BID) | ORAL | 0 refills | Status: DC
  Filled 2023-11-25: qty 60, 30d supply, fill #0

## 2023-11-25 MED ORDER — LISINOPRIL-HYDROCHLOROTHIAZIDE 20-25 MG PO TABS
1.0000 | ORAL_TABLET | Freq: Every day | ORAL | 0 refills | Status: DC
  Filled 2023-11-25: qty 90, 90d supply, fill #0

## 2023-11-26 ENCOUNTER — Encounter (HOSPITAL_COMMUNITY): Payer: Self-pay

## 2023-11-26 ENCOUNTER — Other Ambulatory Visit: Payer: Self-pay

## 2023-11-26 ENCOUNTER — Other Ambulatory Visit (HOSPITAL_COMMUNITY): Payer: Self-pay

## 2023-11-30 ENCOUNTER — Telehealth (HOSPITAL_COMMUNITY): Payer: Self-pay | Admitting: *Deleted

## 2023-11-30 NOTE — Telephone Encounter (Signed)
 Attempted to call patient regarding upcoming cardiac CT appointment. Left message on voicemail with name and callback number Sid Seats RN Navigator Cardiac Imaging [facility] [phone] Office  Advised updated lab work is needed prior to CT scan.

## 2023-12-02 ENCOUNTER — Ambulatory Visit (HOSPITAL_COMMUNITY)
Admission: RE | Admit: 2023-12-02 | Discharge: 2023-12-02 | Disposition: A | Discharge status: Home / Self Care | Source: Ambulatory Visit | Attending: Cardiology | Admitting: Cardiology

## 2023-12-02 ENCOUNTER — Ambulatory Visit: Payer: Self-pay | Admitting: Cardiology

## 2023-12-02 ENCOUNTER — Other Ambulatory Visit: Payer: Self-pay | Admitting: Internal Medicine

## 2023-12-02 ENCOUNTER — Other Ambulatory Visit (HOSPITAL_COMMUNITY): Payer: Self-pay

## 2023-12-02 DIAGNOSIS — E119 Type 2 diabetes mellitus without complications: Secondary | ICD-10-CM | POA: Insufficient documentation

## 2023-12-02 DIAGNOSIS — I498 Other specified cardiac arrhythmias: Secondary | ICD-10-CM | POA: Diagnosis not present

## 2023-12-02 DIAGNOSIS — M797 Fibromyalgia: Secondary | ICD-10-CM

## 2023-12-02 DIAGNOSIS — I10 Essential (primary) hypertension: Secondary | ICD-10-CM | POA: Diagnosis not present

## 2023-12-02 DIAGNOSIS — R072 Precordial pain: Secondary | ICD-10-CM | POA: Diagnosis not present

## 2023-12-02 DIAGNOSIS — E11649 Type 2 diabetes mellitus with hypoglycemia without coma: Secondary | ICD-10-CM

## 2023-12-02 DIAGNOSIS — E8881 Metabolic syndrome: Secondary | ICD-10-CM | POA: Diagnosis not present

## 2023-12-02 DIAGNOSIS — I2510 Atherosclerotic heart disease of native coronary artery without angina pectoris: Secondary | ICD-10-CM | POA: Diagnosis not present

## 2023-12-02 LAB — POCT I-STAT, CHEM 8
BUN: 7 mg/dL (ref 6–20)
Calcium, Ion: 1.3 mmol/L (ref 1.15–1.40)
Chloride: 101 mmol/L (ref 98–111)
Creatinine, Ser: 0.9 mg/dL (ref 0.44–1.00)
Glucose, Bld: 116 mg/dL — ABNORMAL HIGH (ref 70–99)
HCT: 36 % (ref 36.0–46.0)
Hemoglobin: 12.2 g/dL (ref 12.0–15.0)
Potassium: 3.8 mmol/L (ref 3.5–5.1)
Sodium: 141 mmol/L (ref 135–145)
TCO2: 27 mmol/L (ref 22–32)

## 2023-12-02 MED ORDER — PROMETHAZINE HCL 12.5 MG PO TABS
12.5000 mg | ORAL_TABLET | Freq: Three times a day (TID) | ORAL | 1 refills | Status: DC | PRN
  Filled 2023-12-02: qty 20, 7d supply, fill #0

## 2023-12-02 MED ORDER — NITROGLYCERIN 0.4 MG SL SUBL
SUBLINGUAL_TABLET | SUBLINGUAL | Status: AC
  Filled 2023-12-02: qty 2

## 2023-12-02 MED ORDER — CYCLOBENZAPRINE HCL 10 MG PO TABS
10.0000 mg | ORAL_TABLET | Freq: Two times a day (BID) | ORAL | 2 refills | Status: DC | PRN
  Filled 2023-12-02: qty 40, 20d supply, fill #0

## 2023-12-02 MED ORDER — NITROGLYCERIN 0.4 MG SL SUBL
0.8000 mg | SUBLINGUAL_TABLET | Freq: Once | SUBLINGUAL | Status: AC
  Administered 2023-12-02: 0.8 mg via SUBLINGUAL

## 2023-12-02 MED ORDER — IOHEXOL 350 MG/ML SOLN
100.0000 mL | Freq: Once | INTRAVENOUS | Status: AC | PRN
Start: 2023-12-02 — End: 2023-12-02
  Administered 2023-12-02: 100 mL via INTRAVENOUS

## 2023-12-06 ENCOUNTER — Telehealth: Admitting: Family Medicine

## 2023-12-06 ENCOUNTER — Ambulatory Visit: Payer: Self-pay

## 2023-12-06 ENCOUNTER — Encounter: Payer: Self-pay | Admitting: Family Medicine

## 2023-12-06 DIAGNOSIS — R059 Cough, unspecified: Secondary | ICD-10-CM

## 2023-12-06 NOTE — Telephone Encounter (Signed)
 FYI Only or Action Required?: FYI only for provider.  Patient was last seen in primary care on 10/11/2023 by Sherry Prost, MD. Called Nurse Triage reporting Cough. Symptoms began several weeks ago. Interventions attempted: OTC medications: DM and Rest, hydration, or home remedies. Symptoms are: unchanged.  Triage Disposition: See Physician Within 24 Hours  Patient/caregiver understands and will follow disposition?: Yes  Copied from CRM [id number]. Topic: Clinical - Red Word Triage >> Dec 06, 2023  9:58 AM Sherry [name] wrote: Red Word that prompted transfer to Nurse Triage: Cough - consistent Reason for Disposition  [1] Continuous (nonstop) coughing interferes with work or school AND [2] no improvement using cough treatment per Care Advice  Answer Assessment - Initial Assessment Questions 1. ONSET: When did the cough begin?      3 weeks  2. SEVERITY: How bad is the cough today?      Constant but no struggle  3. SPUTUM: Describe the color of your sputum (none, dry cough; clear, white, yellow, green)     Denies  4. HEMOPTYSIS: Are you coughing up any blood? If so ask: How much? (flecks, streaks, tablespoons, etc.)     Denies 5. DIFFICULTY BREATHING: Are you having difficulty breathing? If Yes, ask: How bad is it? (e.g., mild, moderate, severe)    - MILD: No SOB at rest, mild SOB with walking, speaks normally in sentences, can lie down, no retractions, pulse < 100.    - MODERATE: SOB at rest, SOB with minimal exertion and prefers to sit, cannot lie down flat, speaks in phrases, mild retractions, audible wheezing, pulse 100-120.    - SEVERE: Very SOB at rest, speaks in single words, struggling to breathe, sitting hunched forward, retractions, pulse > 120      Denies  6. FEVER: Do you have [name]? If Yes, ask: What is your temperature, how was it measured, and when did it start?     Denies   Additional info:  Using Cough Dm but still waking up at night. Cough is effecting work, she  works at [employer] and really needs to control her cough. Requesting Tesslon Pearls. No appointments available in office, virtual urgent care scheduled today.  Protocols used: Cough - Acute Non-Productive-[name]-AH

## 2023-12-06 NOTE — Progress Notes (Signed)
 Arrived late- and unable to return.  [place]

## 2023-12-13 NOTE — Telephone Encounter (Signed)
 Called  and left detail message of CCTA results on secure voicemail  per DPR Once reviewed result is mychart , any question may call back . Otherwise keep upcoming appt 01/14/24.

## 2023-12-13 NOTE — Telephone Encounter (Signed)
-----   Message from Alm Clay sent at 12/05/2023  6:06 PM EDT ----- CT angiogram results are quite reassuring.  Coronary calcium score is 0 which is very reassuring. 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  This is very reassuring that there does not appear to be any significant flow-limiting heart artery blockages.  It does mean however that we should pay attention to the presence of heart artery  plaque and be more aggressive with treating it.  We can look for other reasons for chest pain/shortness of breath..  Treating risk factors such as blood sugars, cholesterol and blood pressure are the main methods of risk factor reduction.  Alm Clay, MD  ----- Message ----- From: Interface, Rad Results In Sent: 12/02/2023   4:46 PM EDT To: Alm LELON Clay, MD

## 2023-12-27 ENCOUNTER — Other Ambulatory Visit (HOSPITAL_COMMUNITY): Payer: Self-pay

## 2023-12-27 ENCOUNTER — Other Ambulatory Visit: Payer: Self-pay

## 2023-12-27 ENCOUNTER — Ambulatory Visit
Admission: EM | Admit: 2023-12-27 | Discharge: 2023-12-27 | Disposition: A | Discharge status: Home / Self Care | Attending: Physician Assistant | Admitting: Physician Assistant

## 2023-12-27 DIAGNOSIS — G8929 Other chronic pain: Secondary | ICD-10-CM

## 2023-12-27 DIAGNOSIS — M545 Low back pain, unspecified: Secondary | ICD-10-CM | POA: Diagnosis not present

## 2023-12-27 MED ORDER — METHOCARBAMOL 500 MG PO TABS
500.0000 mg | ORAL_TABLET | Freq: Two times a day (BID) | ORAL | 0 refills | Status: AC
  Filled 2023-12-27: qty 20, 10d supply, fill #0

## 2023-12-27 MED ORDER — LIDOCAINE 5 % EX PTCH
1.0000 | MEDICATED_PATCH | CUTANEOUS | 0 refills | Status: AC
  Filled 2023-12-27: qty 14, 14d supply, fill #0

## 2023-12-27 MED ORDER — KETOROLAC TROMETHAMINE 30 MG/ML IJ SOLN
30.0000 mg | Freq: Once | INTRAMUSCULAR | Status: AC
  Administered 2023-12-27: 30 mg via INTRAMUSCULAR

## 2023-12-27 NOTE — Discharge Instructions (Addendum)
 Based on your symptoms and physical exam I believe the following is the cause of your concern today Back pain likely secondary to a strain of your back muscles  I recommend the following at this time to help relieve that discomfort:  Rest Warm compresses to the area (20 minutes on, minimum of 30 minutes off) You can alternate Tylenol  and Ibuprofen  for pain management but Ibuprofen  is typically preferred to reduce inflammation.  Gentle stretches and exercises that I have included in your paperwork Try to reduce excess strain to the area and rest as much as possible  Wear supportive shoes and, if you must lift anything, use proper lifting techniques that spare your back.

## 2023-12-27 NOTE — ED Triage Notes (Addendum)
 Pt presents with complaints of lower back pain x 2 weeks. Currently rates overall back pain a 10/10. Describes the pain as throbbing and aching. Denies recent injuries. Denies urinary symptoms. Also voices aching pain in bilateral legs. Pt states it is her fibromyalgia.

## 2023-12-27 NOTE — ED Provider Notes (Signed)
 [facility]    CSN: [id number] Arrival date & time: 12/27/23  9160      History   Chief Complaint Chief Complaint  Patient presents with   Back Pain    HPI Sherry [name] is a 63 y.o. female.   HPI  Onset: gradual with sudden worsening  Duration: she reports the back pain has been ongoing for several weeks but over the last 2-3 she has had increased pain. She reports laying in bed most of Sat then she got herself up for church on Sun and had more pain  Location: lower back pain and leg pain. She states her leg pain is consistent with her fibromyalgia pain but is not sure if her back pain is being caused by this.  Radiation: none  Pain level and character: throbbing and aching  Other associated symptoms: She denies numbness or tingling  She denies saddle anesthesia, incontinence,  Interventions: Tylenol  rapid relief and muscle relaxer but this has not provided any relief  Alleviating: laying down and resting  Aggravating: she reports if I'm doing something makes it hurt worse   She denies recent injuries or trauma to the area  She takes a muscle relaxer and Tylenol  rapid relief regularly for her fibromyalgia  She also uses Voltaren  cream as well for it  She reports that Kenalog  injection provided at last UC apt was beneficial. She thinks the robaxin  was more helpful for her  pain than the flexeril  . She is taking Flexeril  currently. Reviewed that she cannot take these medications together due to increased risk of side effects and respiratory depression    Past Medical History:  Diagnosis Date   Acid reflux disease    Anginal pain (HCC)    last time 04/18/13 in afternoon; referred to Dr. Vina Gull   Anxiety    Cancer [facility])    uterine- had hysterectomy   Chest pain 04/07/2011   Chronic pain    Chronic pain 06/19/2014   Cough 09/25/2011   Depression    Fast heart beat    Fibromyalgia    Headache(784.0) 12/27/2012   History of blood transfusion 1981    childbirth   History of kidney stones    passed   Hypertension    Influenza A 07/08/2017   Left wrist pain 03/10/2013   Migraine    last one was 04/18/13   Sleep apnea    does not use CPAP   Trichomonas contact, treated     Patient Active Problem List   Diagnosis Date Noted   Precordial pain 11/11/2023   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   Right upper lobe pneumonia 08/11/2023   Acute pain of left shoulder 12/18/2022   Hyperlipidemia associated with type 2 diabetes mellitus (HCC) 03/05/2022   Osteoarthritis of multiple joints 08/25/2021   Multiple thyroid  nodules 08/20/2020   Type 2 diabetes mellitus not at goal [facility]) 04/22/2020   Healthcare maintenance 01/26/2019   Insomnia 12/29/2018   Solitary pulmonary nodule 08/03/2018   Depression 05/09/2018   Sleep apnea 05/18/2013   Lumbar facet arthropathy 03/10/2013   Metabolic syndrome 09/10/2011   Obesity 09/02/2011   Atypical chest pain 04/07/2011   Fibromyalgia 03/24/2011   Anxiety 03/24/2011   Essential hypertension 03/24/2011   GERD 01/05/2007    Past Surgical History:  Procedure Laterality Date   CHOLECYSTECTOMY     2008   ORIF CALCANEOUS FRACTURE Left 05/16/2013   Procedure: OPEN REDUCTION INTERNAL FIXATION (ORIF) LEFT CALCANEOUS FRACTURE;  Surgeon: Ozell VEAR Bruch,  MD;  Location: [facility] OR;  Service: Orthopedics;  Laterality: Left;   TOTAL ABDOMINAL HYSTERECTOMY     1986   TRANSTHORACIC ECHOCARDIOGRAM  04/2013   Normal LV size and function EF is 60 to 65%.  No RWMA.  Normal valves.   WISDOM TOOTH EXTRACTION      OB History     Gravida  3   Para  2   Term  2   Preterm      AB  1   Living  2      SAB  1   IAB      Ectopic      Multiple      Live Births  2            Home Medications    Prior to Admission medications   Medication Sig Start Date End Date Taking? Authorizing Provider  lidocaine  (LIDODERM ) 5 % Place 1 patch onto the skin daily. Remove & Discard patch within 12 hours or as  directed by doctor. 12/27/23  Yes Meegan Shanafelt E, PA-C  methocarbamol  (ROBAXIN ) 500 MG tablet Take 1 tablet (500 mg total) by mouth 2 (two) times daily. 12/27/23  Yes Soliana Kitko E, PA-C  aspirin  EC 81 MG tablet Take 81 mg by mouth daily. Swallow whole.    [provider]  aspirin -acetaminophen -caffeine (EXCEDRIN MIGRAINE) 250-250-65 MG tablet Take 2 tablets by mouth every 6 (six) hours as needed for headache.    [provider]  busPIRone  (BUSPAR ) 7.5 MG tablet Take 1 tablet (7.5 mg total) by mouth 2 (two) times daily. 08/11/23   Marylu Gee, DO  cetirizine  (ZYRTEC  ALLERGY) 10 MG tablet Take 1 tablet (10 mg total) by mouth daily. 10/19/22   Rising, Asberry, PA-C  cyclobenzaprine  (FLEXERIL ) 10 MG tablet Take 1 tablet (10 mg total) by mouth 2 (two) times daily as needed. 12/02/23   Norrine Sharper, MD  diclofenac  Sodium (VOLTAREN ) 1 % GEL Apply 2 grams topically 4 (four) times daily. 11/25/23   Addie Perkins, DO  docusate sodium  (COLACE) 100 MG capsule Take 1 capsule (100 mg total) by mouth every 12 (twelve) hours. 07/24/22   Addie Perkins, DO  hydrOXYzine  (ATARAX ) 10 MG tablet Take 1 tablet (10 mg total) by mouth 3 (three) times daily as needed. 04/16/23   Nooruddin, Saad, MD  lisinopril -hydrochlorothiazide  (ZESTORETIC ) 20-25 MG tablet Take 1 tablet by mouth daily. 11/25/23 11/24/24  Addie Perkins, DO  meloxicam  (MOBIC ) 15 MG tablet Take 1 tablet (15 mg total) by mouth daily. 11/27/22   Christopher Savannah, PA-C  metFORMIN  (GLUCOPHAGE -XR) 500 MG 24 hr tablet Take 1 tablet (500 mg total) by mouth daily with breakfast. 10/12/23 01/10/24  Fernand Prost, MD  metoprolol  tartrate (LOPRESSOR ) 100 MG tablet Take 1 tablet (100 mg total) by mouth once for 1 dose. TAKE TWO HOURS PRIOR TO  SCHEDULE CARDIAC TEST 11/09/23 11/18/23  Anner Alm ORN, MD  pantoprazole  (PROTONIX ) 40 MG tablet Take 1 tablet (40 mg total) by mouth 2 (two) times daily. 11/25/23   Addie Perkins, DO  polyethylene glycol powder (MIRALAX ) 17 GM/SCOOP powder  Take 1 capful (17 g total) and mix in 8 oz of clear liquid and drink by mouth daily as needed for moderate constipation or severe constipation. 07/24/22   Addie Perkins, DO  promethazine  (PHENERGAN ) 12.5 MG tablet Take 1 tablet (12.5 mg total) by mouth every 8 (eight) hours as needed for nausea or vomiting. 12/02/23 12/20/23  Norrine Sharper, MD  simvastatin  (ZOCOR ) 40 MG  tablet Take 1 tablet (40 mg total) by mouth at bedtime. 03/19/23   Addie Perkins, DO    Family History Family History  Problem Relation Age of Onset   Diabetes Mother    Hypertension Mother    Depression Mother    Hypertension Father    Cancer Father        Prostate   Alcohol abuse Father    Diabetes Sister    Cancer Brother        Prostate   Breast cancer Neg Hx    Colon cancer Neg Hx    Colon polyps Neg Hx    Esophageal cancer Neg Hx    Stomach cancer Neg Hx    Rectal cancer Neg Hx     Social History Social History   Tobacco Use   Smoking status: Never   Smokeless tobacco: Never  Vaping Use   Vaping status: Never Used  Substance Use Topics   Alcohol use: No   Drug use: No     Allergies   Duloxetine , Duloxetine  hcl, Pregabalin, and Gabapentin    Review of Systems Review of Systems  Musculoskeletal:  Positive for back pain.     Physical Exam Triage Vital Signs ED Triage Vitals  Encounter Vitals Group     BP 12/27/23 0856 125/83     Girls Systolic BP Percentile --      Girls Diastolic BP Percentile --      Boys Systolic BP Percentile --      Boys Diastolic BP Percentile --      Pulse Rate 12/27/23 0856 84     Resp 12/27/23 0856 18     Temp 12/27/23 0856 98 F (36.7 C)     Temp Source 12/27/23 0856 Oral     SpO2 12/27/23 0856 96 %     Weight 12/27/23 0856 159 lb 8 oz (72.3 kg)     Height 12/27/23 0856 5' (1.524 m)     Head Circumference --      Peak Flow --      Pain Score 12/27/23 0914 10     Pain Loc --      Pain Education --      Exclude from Growth Chart --    No data  found.  Updated Vital Signs BP 125/83 (BP Location: Right Arm)   Pulse 84   Temp 98 F (36.7 C) (Oral)   Resp 18   Ht 5' (1.524 m)   Wt 159 lb 8 oz (72.3 kg)   SpO2 96%   BMI 31.15 kg/m   Visual Acuity Right Eye Distance:   Left Eye Distance:   Bilateral Distance:    Right Eye Near:   Left Eye Near:    Bilateral Near:     Physical Exam Vitals reviewed.  Constitutional:      General: She is awake.     Appearance: Normal appearance. She is well-developed and well-groomed.  HENT:     Head: Normocephalic and atraumatic.  Eyes:     General: Lids are normal. Gaze aligned appropriately.     Extraocular Movements: Extraocular movements intact.     Conjunctiva/sclera: Conjunctivae normal.  Pulmonary:     Effort: Pulmonary effort is normal.  Musculoskeletal:     Cervical back: Normal range of motion.     Thoracic back: No swelling or deformity. Normal range of motion.     Lumbar back: No swelling or deformity.     Comments: No obvious step offs or abnormalities of  cervical, thoracic, lumbar spines with palpations   ROM findings Thoracic: Lateral flexion and lateral rotation are intact and symmetrical. Pt reports pain in lower spine with thoracic ROM- of note she is leaning forward while doing ROM movements and is not able to stand up straight  Lumbar: Extension is intact, flexion is limited  Hips: Extension, flexion, abduction, adduction are symmetrical bilaterally.    Neurological:     Mental Status: She is alert and oriented to person, place, and time.  Psychiatric:        Attention and Perception: Attention and perception normal.        Mood and Affect: Mood and affect normal.        Speech: Speech normal.        Behavior: Behavior normal. Behavior is cooperative.      UC Treatments / Results  Labs (all labs ordered are listed, but only abnormal results are displayed) Labs Reviewed - No data to display  EKG   Radiology No results  found.  Procedures Procedures (including critical care time)  Medications Ordered in UC Medications  ketorolac  (TORADOL ) 30 MG/ML injection 30 mg (30 mg Intramuscular Given 12/27/23 0955)    Initial Impression / Assessment and Plan / UC Course  I have reviewed the triage vital signs and the nursing notes.  Pertinent labs & imaging results that were available during my care of the patient were reviewed by me and considered in my medical decision making (see chart for details).      Final Clinical Impressions(s) / UC Diagnoses   Final diagnoses:  Chronic bilateral low back pain without sciatica  Patient presents today with concerns for lower back pain and leg pain that is been ongoing for the last few weeks but over the last 2 to 3 days has increased and become more severe.  She denies red flag symptoms and reports limited relief with home measures.  Physical exam is notable for decreased range of motion at the lumbar spine but no obvious evidence of step-offs or abnormalities with palpation of cervical, thoracic, lumbar spines.  To assist with management we will give Toradol  30 mg injection here in clinic And send in prescription for methocarbamol  and lidocaine  patches.  Recommend follow-up with PCP for ongoing management as she may need referral to physical therapy.  Follow-up as needed for progressing or persistent symptoms    Discharge Instructions      Based on your symptoms and physical exam I believe the following is the cause of your concern today Back pain likely secondary to a strain of your back muscles  I recommend the following at this time to help relieve that discomfort:  Rest Warm compresses to the area (20 minutes on, minimum of 30 minutes off) You can alternate Tylenol  and Ibuprofen  for pain management but Ibuprofen  is typically preferred to reduce inflammation.   Gentle stretches and exercises that I have included in your paperwork Try to reduce excess strain to  the area and rest as much as possible  Wear supportive shoes and, if you must lift anything, use proper lifting techniques that spare your back.       ED Prescriptions     Medication Sig Dispense Auth. Provider   methocarbamol  (ROBAXIN ) 500 MG tablet Take 1 tablet (500 mg total) by mouth 2 (two) times daily. 20 tablet Ruvi Fullenwider E, PA-C   lidocaine  (LIDODERM ) 5 % Place 1 patch onto the skin daily. Remove & Discard patch within 12 hours or as  directed by doctor. 14 patch Reggie Bise E, PA-C      PDMP not reviewed this encounter.   Marylene Rocky BRAVO, PA-C 12/28/23 1343

## 2024-01-04 ENCOUNTER — Ambulatory Visit: Admitting: Student

## 2024-01-04 VITALS — BP 107/74 | HR 99 | Temp 98.4°F | Ht 61.0 in | Wt 157.9 lb

## 2024-01-04 DIAGNOSIS — Z7984 Long term (current) use of oral hypoglycemic drugs: Secondary | ICD-10-CM

## 2024-01-04 DIAGNOSIS — M797 Fibromyalgia: Secondary | ICD-10-CM | POA: Diagnosis not present

## 2024-01-04 DIAGNOSIS — E1169 Type 2 diabetes mellitus with other specified complication: Secondary | ICD-10-CM | POA: Diagnosis not present

## 2024-01-04 DIAGNOSIS — E785 Hyperlipidemia, unspecified: Secondary | ICD-10-CM

## 2024-01-04 DIAGNOSIS — E119 Type 2 diabetes mellitus without complications: Secondary | ICD-10-CM | POA: Diagnosis not present

## 2024-01-04 DIAGNOSIS — R059 Cough, unspecified: Secondary | ICD-10-CM

## 2024-01-04 DIAGNOSIS — I10 Essential (primary) hypertension: Secondary | ICD-10-CM

## 2024-01-04 DIAGNOSIS — R053 Chronic cough: Secondary | ICD-10-CM

## 2024-01-04 DIAGNOSIS — M150 Primary generalized (osteo)arthritis: Secondary | ICD-10-CM

## 2024-01-04 NOTE — Patient Instructions (Signed)
 Please check your blood pressure twice per day and bring that in to your next appointment. Please call the clinic if you are continually getting readings > 160 for the first number.

## 2024-01-04 NOTE — Progress Notes (Unsigned)
 CC: Follow-up  HPI:  [name] is a 63 y.o. female living with a history stated below and presents today for follow-up. Please see problem based assessment and plan for additional details.  Past Medical History:  Diagnosis Date   Acid reflux disease    Anginal pain (HCC)    last time 04/18/13 in afternoon; referred to Dr. 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 Swallow whole.     aspirin -acetaminophen -caffeine (EXCEDRIN MIGRAINE) 250-250-65 MG tablet Take 2 tablets by mouth every 6 (six) hours as needed for headache.     busPIRone  (BUSPAR ) 7.5 MG tablet Take 1 tablet (7.5 mg total) by mouth 2 (two) times daily. 60 tablet 3   cetirizine  (ZYRTEC  ALLERGY) 10 MG tablet Take 1 tablet (10 mg total) by mouth daily. 30 tablet 2   cyclobenzaprine  (FLEXERIL ) 10 MG tablet Take 1 tablet (10 mg total) by mouth 2 (two) times daily as needed. 40 tablet 2   diclofenac  Sodium (VOLTAREN ) 1 % GEL Apply 2 grams topically 4 (four) times daily. 100 g 0   docusate sodium  (COLACE) 100 MG capsule Take 1 capsule (100 mg total) by mouth every 12 (twelve) hours. 60 capsule 0   hydrOXYzine  (ATARAX ) 10 MG tablet Take 1 tablet (10 mg total) by mouth 3 (three) times daily as needed. 30 tablet 0   lidocaine  (LIDODERM ) 5 % Place 1 patch onto the skin daily. Remove & Discard patch within 12 hours or as directed by  doctor. 14 patch 0   lisinopril -hydrochlorothiazide  (ZESTORETIC ) 20-25 MG tablet Take 1 tablet by mouth daily. 90 tablet 0   meloxicam  (MOBIC ) 15 MG tablet Take 1 tablet (15 mg total) by mouth daily. 30 tablet 0   metFORMIN  (GLUCOPHAGE -XR) 500 MG 24 hr tablet Take 1 tablet (500 mg total) by mouth daily with breakfast. 30 tablet 2   methocarbamol  (ROBAXIN ) 500 MG tablet Take 1 tablet (500 mg total) by mouth 2 (two) times daily. 20 tablet 0   metoprolol  tartrate (LOPRESSOR ) 100 MG tablet Take 1 tablet (100 mg total) by mouth once for 1 dose. TAKE TWO HOURS PRIOR TO  SCHEDULE CARDIAC TEST 1 tablet 0   pantoprazole  (PROTONIX ) 40 MG tablet Take 1 tablet (40 mg total) by mouth 2 (two) times daily. 120 tablet 0   polyethylene glycol powder (MIRALAX ) 17 GM/SCOOP powder Take 1 capful (17 g total) and mix in 8 oz of clear liquid and drink by mouth daily as needed for moderate constipation or severe constipation. 510 g 0   promethazine  (PHENERGAN ) 12.5 MG tablet Take 1 tablet (12.5 mg total) by mouth every 8 (eight) hours as needed for nausea or vomiting. 20 tablet 1   simvastatin  (  ZOCOR ) 40 MG tablet Take 1 tablet (40 mg total) by mouth at bedtime. 90 tablet 1   No current facility-administered medications on file prior to visit.    Family History  Problem Relation Age of Onset   Diabetes Mother    Hypertension Mother    Depression Mother    Hypertension Father    Cancer Father        Prostate   Alcohol abuse Father    Diabetes Sister    Cancer Brother        Prostate   Breast cancer Neg Hx    Colon cancer Neg Hx    Colon polyps Neg Hx    Esophageal cancer Neg Hx    Stomach cancer Neg Hx    Rectal cancer Neg Hx     Social History   Socioeconomic History   Marital status: Single    Spouse name: Not on file   Number of children: 2   Years of education: Not on file   Highest education level: 12th grade  Occupational History   Occupation: Unemployed  Tobacco Use   Smoking status: Never    Smokeless tobacco: Never  Vaping Use   Vaping status: Never Used  Substance and Sexual Activity   Alcohol use: No   Drug use: No   Sexual activity: Not on file  Other Topics Concern   Not on file  Social History Narrative   Lives with   2 Children: Ages 22 and 65   4 Grandchildren: 2, 3, 4 and 90   Brother      Diet: Barely eats. Does not exercise due to pain.      03/2011- Currently unemployed. 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 Unsure of last pap.   Unsure of last Td   Unsure of last flu shot   Social Drivers of Corporate investment banker Strain: Not on file  Food Insecurity: No Food Insecurity (09/11/2021)   Hunger Vital Sign    Worried About Running Out of Food in the Last Year: Never true    Ran Out of Food in the Last Year: Never true  Transportation Needs: No Transportation Needs (09/11/2021)   PRAPARE - Administrator, Civil Service (Medical): No    Lack of Transportation (Non-Medical): No  Physical Activity: Not on file  Stress: Not on file  Social Connections: Unknown (10/20/2021)   Received from [facility]   Social Network    Social Network: Not on file  Intimate Partner Violence: Unknown (09/10/2021)   Received from [facility]   HITS    Physically Hurt: Not on file    Insult or Talk Down To: Not on file    Threaten Physical Harm: Not on file    Scream or Curse: Not on file    Review of Systems: ROS negative except for what is noted on the assessment and plan.  Vitals:   01/04/24 1106  BP: 107/74  Pulse: 99  Temp: 98.4 F (36.9 C)  TempSrc: Oral  SpO2: 99%  Weight: 157 lb 14.4 oz (71.6 kg)  Height: 5' 1 (1.549 m)    Physical Exam: Constitutional: well-appearing, sitting in chair, in no acute distress Cardiovascular: regular rate and rhythm, no m/r/g Pulmonary/Chest: normal work of breathing on room air, lungs clear to auscultation bilaterally Skin: warm and  dry Psych: normal mood and behavior  Assessment & Plan:  Patient discussed with Dr. Francesco  Type 2 diabetes mellitus not at goal [facility]) Check A1c today (6.8 in 07/2023). Managed with Metformin  XR 500 mg daily. Focuses on lifestyle modifications for now but will re-evaluate based on A1c. Check BMP today as well.   Essential hypertension Normotensive. See plan for cough. Holding Zestoretic  for now and will check ambulatory readings. She is instructed to call the clinic if BP is consistently > 140 at home.   Fibromyalgia Chronic and continually bothersome. Requests referral to pain clinic which I sent today.   Hyperlipidemia associated with type 2 diabetes mellitus (HCC) Check Lipid panel at follow-up. LDL was 105 05/2022 with goal < 70. Continue   Cough She has had a dry cough for years with unremarkable work-up. Given that she is on Zestoretic  (Lisinopril  component) and she is normotensive at (107/74), will attempt to hold BP med to see if symptoms improve and to further assess need for BP medication.    Norman Lobstein, D.O. [facility] Internal Medicine, PGY-2 Phone: [phone] Date 01/06/2024 Time 8:17 AM

## 2024-01-05 ENCOUNTER — Other Ambulatory Visit (HOSPITAL_COMMUNITY): Payer: Self-pay

## 2024-01-05 LAB — BASIC METABOLIC PANEL WITH GFR
BUN/Creatinine Ratio: 10 — ABNORMAL LOW (ref 12–28)
BUN: 8 mg/dL (ref 8–27)
CO2: 23 mmol/L (ref 20–29)
Calcium: 10.3 mg/dL (ref 8.7–10.3)
Chloride: 99 mmol/L (ref 96–106)
Creatinine, Ser: 0.83 mg/dL (ref 0.57–1.00)
Glucose: 104 mg/dL — ABNORMAL HIGH (ref 70–99)
Potassium: 4.5 mmol/L (ref 3.5–5.2)
Sodium: 137 mmol/L (ref 134–144)
eGFR: 80 mL/min/1.73 (ref >59)

## 2024-01-05 LAB — HEMOGLOBIN A1C
Est. average glucose Bld gHb Est-mCnc: 134 mg/dL
Hgb A1c MFr Bld: 6.3 % — ABNORMAL HIGH (ref 4.8–5.6)

## 2024-01-06 NOTE — Assessment & Plan Note (Addendum)
 Normotensive. See plan for cough. Holding Zestoretic  for now and will check ambulatory readings. She is instructed to call the clinic if BP is consistently > 140 at home.

## 2024-01-06 NOTE — Assessment & Plan Note (Signed)
 Check A1c today (6.8 in 07/2023). Managed with Metformin  XR 500 mg daily. Focuses on lifestyle modifications for now but will re-evaluate based on A1c. Check BMP today as well.

## 2024-01-06 NOTE — Assessment & Plan Note (Signed)
 Chronic and continually bothersome. Requests referral to pain clinic which I sent today.

## 2024-01-06 NOTE — Assessment & Plan Note (Deleted)
 Chronic and continually bothersome. Requests referral to pain clinic which I sent today.

## 2024-01-06 NOTE — Progress Notes (Signed)
 Internal Medicine Clinic Attending  I was physically present during the key portions of the resident provided service and participated in the medical decision making of patient's management care. I reviewed pertinent patient test results.  The assessment, diagnosis, and plan were formulated together and I agree with the documentation in the resident's note.  Cherylene Corrente, MD

## 2024-01-06 NOTE — Assessment & Plan Note (Signed)
 Check Lipid panel at follow-up. LDL was 105 05/2022 with goal < 70. Continue

## 2024-01-06 NOTE — Assessment & Plan Note (Signed)
 She has had a dry cough for years with unremarkable work-up. Given that she is on Zestoretic  (Lisinopril  component) and she is normotensive at (107/74), will attempt to hold BP med to see if symptoms improve and to further assess need for BP medication.

## 2024-01-07 ENCOUNTER — Ambulatory Visit

## 2024-01-14 ENCOUNTER — Ambulatory Visit: Attending: Cardiology | Admitting: Cardiology

## 2024-02-08 ENCOUNTER — Other Ambulatory Visit (HOSPITAL_COMMUNITY): Payer: Self-pay

## 2024-02-08 ENCOUNTER — Other Ambulatory Visit: Payer: Self-pay

## 2024-02-08 DIAGNOSIS — E11649 Type 2 diabetes mellitus with hypoglycemia without coma: Secondary | ICD-10-CM

## 2024-02-08 DIAGNOSIS — M797 Fibromyalgia: Secondary | ICD-10-CM

## 2024-02-08 DIAGNOSIS — K219 Gastro-esophageal reflux disease without esophagitis: Secondary | ICD-10-CM

## 2024-02-08 DIAGNOSIS — I10 Essential (primary) hypertension: Secondary | ICD-10-CM

## 2024-02-08 MED ORDER — PANTOPRAZOLE SODIUM 40 MG PO TBEC
40.0000 mg | DELAYED_RELEASE_TABLET | Freq: Two times a day (BID) | ORAL | 0 refills | Status: DC
  Filled 2024-02-08: qty 60, 30d supply, fill #0
  Filled 2024-04-06: qty 60, 30d supply, fill #1

## 2024-02-08 MED ORDER — PROMETHAZINE HCL 12.5 MG PO TABS
12.5000 mg | ORAL_TABLET | Freq: Three times a day (TID) | ORAL | 1 refills | Status: DC | PRN
  Filled 2024-02-08: qty 20, 7d supply, fill #0
  Filled 2024-04-06: qty 20, 7d supply, fill #1

## 2024-02-08 MED ORDER — CYCLOBENZAPRINE HCL 10 MG PO TABS
10.0000 mg | ORAL_TABLET | Freq: Two times a day (BID) | ORAL | 2 refills | Status: DC | PRN
  Filled 2024-02-08: qty 40, 20d supply, fill #0
  Filled 2024-04-06: qty 40, 20d supply, fill #1
  Filled 2024-05-12: qty 40, 20d supply, fill #2

## 2024-02-08 MED ORDER — LISINOPRIL-HYDROCHLOROTHIAZIDE 20-25 MG PO TABS
1.0000 | ORAL_TABLET | Freq: Every day | ORAL | 0 refills | Status: DC
  Filled 2024-02-08: qty 90, 90d supply, fill #0

## 2024-02-08 MED ORDER — DOCUSATE SODIUM 100 MG PO CAPS
100.0000 mg | ORAL_CAPSULE | Freq: Two times a day (BID) | ORAL | 0 refills | Status: DC
  Filled 2024-02-08: qty 60, 30d supply, fill #0

## 2024-02-08 MED ORDER — DICLOFENAC SODIUM 1 % EX GEL
2.0000 g | Freq: Four times a day (QID) | CUTANEOUS | 0 refills | Status: DC
  Filled 2024-02-08: qty 100, 12d supply, fill #0

## 2024-02-08 NOTE — Telephone Encounter (Signed)
 Copied from CRM [id number]. Topic: Clinical - Medication Refill >> Feb 08, 2024  9:06 AM Zane F wrote: Patient is calling in the following prescriptions; Patient is out of all of them and will schedule a follow up appointment  Callback Number: [id number]    Medication:   pantoprazole  (PROTONIX ) 40 MG tablet  promethazine  (PHENERGAN ) 12.5 MG tablet  lisinopril -hydrochlorothiazide  (ZESTORETIC ) 20-25 MG tablet  docusate sodium  (COLACE) 100 MG  diclofenac  Sodium (VOLTAREN ) 1 % GEL  cyclobenzaprine  (FLEXERIL ) 10 MG   Has the patient contacted their pharmacy? Yes   This is the patient's preferred pharmacy:  [facility] [address], [address] [place] [address] Phone: [phone] Fax: [phone]  Is this the correct pharmacy for this prescription? Yes   Has the prescription been filled recently? No  Is the patient out of the medication? Yes  Has the patient been seen for an appointment in the last year OR does the patient have an upcoming appointment? Yes  Can we respond through MyChart? Yes  Agent: Please be advised that Rx refills may take up to 3 business days. We ask that you follow-up with your pharmacy.

## 2024-02-10 ENCOUNTER — Telehealth: Payer: Self-pay | Admitting: *Deleted

## 2024-02-10 NOTE — Telephone Encounter (Signed)
 Mammogram appointment was canceled (01/07/2024).

## 2024-02-11 ENCOUNTER — Other Ambulatory Visit: Payer: Self-pay

## 2024-02-11 ENCOUNTER — Ambulatory Visit

## 2024-02-11 ENCOUNTER — Other Ambulatory Visit (HOSPITAL_COMMUNITY): Payer: Self-pay

## 2024-02-11 VITALS — BP 165/96 | HR 85 | Temp 98.2°F | Ht 61.0 in | Wt 158.4 lb

## 2024-02-11 DIAGNOSIS — I10 Essential (primary) hypertension: Secondary | ICD-10-CM | POA: Diagnosis not present

## 2024-02-11 DIAGNOSIS — R058 Other specified cough: Secondary | ICD-10-CM

## 2024-02-11 DIAGNOSIS — J302 Other seasonal allergic rhinitis: Secondary | ICD-10-CM | POA: Diagnosis not present

## 2024-02-11 DIAGNOSIS — Z1239 Encounter for other screening for malignant neoplasm of breast: Secondary | ICD-10-CM

## 2024-02-11 DIAGNOSIS — E782 Mixed hyperlipidemia: Secondary | ICD-10-CM

## 2024-02-11 MED ORDER — CETIRIZINE HCL 10 MG PO TABS
10.0000 mg | ORAL_TABLET | Freq: Every day | ORAL | 2 refills | Status: DC
  Filled 2024-02-11: qty 30, 30d supply, fill #0
  Filled 2024-04-06: qty 30, 30d supply, fill #1
  Filled 2024-05-12: qty 30, 30d supply, fill #2

## 2024-02-11 MED ORDER — LOSARTAN POTASSIUM-HCTZ 50-12.5 MG PO TABS
1.0000 | ORAL_TABLET | Freq: Every day | ORAL | 3 refills | Status: DC
  Filled 2024-02-11: qty 90, 90d supply, fill #0

## 2024-02-11 MED ORDER — LOSARTAN POTASSIUM-HCTZ 100-25 MG PO TABS
1.0000 | ORAL_TABLET | Freq: Every day | ORAL | 3 refills | Status: DC
  Filled 2024-02-11: qty 90, 90d supply, fill #0

## 2024-02-11 NOTE — Assessment & Plan Note (Signed)
 Dry cough while on ACEi. Cough stopped while ACEi was held. Consistent with ACEi-induced dry cough. Added adverse effect in patients allergy list.

## 2024-02-11 NOTE — Assessment & Plan Note (Signed)
 AT LOV decision made to stop lisinopril -hydrochlorothiazide  20-25 mg to see if it was causing her chronic dry cough. Today patient states she has not had a cough since stopping the medicine. This is consistent with ACEi-induced cough.  BP today 165/96. Patient woke up with a headache this morning. She has gotten headaches before but does not usually have them. Denies recent changes in vision or changes in urination. Took Excedrin and it feels better. She does check her Bps at home and usually they're around 110/60s she says. Her clinic Bps are 120s/80s or lower mostly recently. Her headache may be associated with her higher pressures or stress related to upcoming vacation. Plan to restart blood pressure medication using similar mechanisms. Start losartan -hydrochlorothiazide  50-12.5 mg once daily. Consider titrating to next highest dose if pressures still high at next visit. Encouraged patient to continue checking pressures at home.  Orders:   losartan -hydrochlorothiazide  (HYZAAR ) 50-12.5 MG tablet; Take 1 tablet by mouth daily.

## 2024-02-11 NOTE — Patient Instructions (Addendum)
 It was wonderful seeing you today!   Please remember to...  1) Start taking your new blood pressure medicine lorsartan-hydrochlorothiazide  50-12.5 mg once daily  2) Someone should call to get your mammogram rescheduled   3) We will get labs at the next visit   And don't forget to get your flu shot before the end of October!  If you have any questions please feel free to the call the clinic at anytime at [phone].  Have a blessed day,  Dr. Charmayne

## 2024-02-11 NOTE — Progress Notes (Signed)
 Established Patient Office Visit  Subjective   Patient ID: Sherry [name], female    DOB: 02/18/1961  Age: 64 y.o. MRN: [id number]  Patient here for a blood pressure check. At LOV we stopped her lisinopril -hydrochlorothiazide  as patient has had a dry cough with an unremarkable work up. She also needs more cetrizine because her allergies are flaring and she needs us  to reschedule her mammogram because missed her appointment. Patient is leaving today for a vacation in [place] with her family. Some of her grandchildren decided to not go last minute and this, understandably, hurt her feelings.          Objective:     BP (!) 165/96 (BP Location: Left Arm, Patient Position: Sitting, Cuff Size: Normal) Comment: HAS NOT TAKEN ANY MEDS  Pulse 85   Temp 98.2 F (36.8 C) (Oral)   Ht 5' 1 (1.549 m)   Wt 158 lb 6.4 oz (71.8 kg)   SpO2 98%   BMI 29.93 kg/m  BP Readings from Last 3 Encounters:  02/11/24 (!) 165/96  01/04/24 107/74  12/27/23 125/83      Physical Exam Vitals reviewed.  Constitutional:      Appearance: Normal appearance.  HENT:     Nose: Congestion present.  Eyes:     Conjunctiva/sclera: Conjunctivae normal.  Cardiovascular:     Rate and Rhythm: Normal rate and regular rhythm.     Pulses: Normal pulses.     Heart sounds: Normal heart sounds.  Pulmonary:     Effort: Pulmonary effort is normal.     Breath sounds: Normal breath sounds.  Musculoskeletal:     Right lower leg: No edema.     Left lower leg: No edema.  Skin:    General: Skin is warm and dry.  Neurological:     General: No focal deficit present.     Mental Status: She is alert and oriented to person, place, and time.      No results found for any visits on 02/11/24.  Last lipids Lab Results  Component Value Date   CHOL 163 05/12/2022   HDL 43 05/12/2022   LDLCALC 105 (H) 05/12/2022   TRIG 77 05/12/2022   CHOLHDL 3.8 05/12/2022   Last hemoglobin A1c Lab Results  Component Value  Date   HGBA1C 6.3 (H) 01/04/2024      The 10-year ASCVD risk score (Arnett DK, et al., 2019) is: 29.4%    Assessment & Plan:   Assessment & Plan Screening breast examination Last mammogram was 04/2022 with recommendation to screen in one year. Patient had screening appointment scheduled for 8/1 but missed her appointment. Plan to reschedule, placed referral today.  Orders:   MM 3D SCREENING MAMMOGRAM BILATERAL BREAST; Future  Mixed hyperlipidemia Last lipid panel in 05/2022, LDL 105 otherwise wnl. Patient currently taking simvastatin  daily but today she states she sometimes forget to take it. Encouraged patient to take as prescribed. Plan to recheck lipid panel at the end of October when we recheck A1c.     Seasonal allergic rhinitis, unspecified trigger Today has mildly stuffy nose, worse with change in weather. Needs refill of cetrizine.  Orders:   cetirizine  (ZYRTEC  ALLERGY) 10 MG tablet; Take 1 tablet (10 mg total) by mouth daily.  Essential hypertension AT LOV decision made to stop lisinopril -hydrochlorothiazide  20-25 mg to see if it was causing her chronic dry cough. Today patient states she has not had a cough since stopping the medicine. This is consistent with ACEi-induced cough.  BP today 165/96. Patient woke up with a headache this morning. She has gotten headaches before but does not usually have them. Denies recent changes in vision or changes in urination. Took Excedrin and it feels better. She does check her Bps at home and usually they're around 110/60s she says. Her clinic Bps are 120s/80s or lower mostly recently. Her headache may be associated with her higher pressures or stress related to upcoming vacation. Plan to restart blood pressure medication using similar mechanisms. Start losartan -hydrochlorothiazide  50-12.5 mg once daily. Consider titrating to next highest dose if pressures still high at next visit. Encouraged patient to continue checking pressures at home.   Orders:   losartan -hydrochlorothiazide  (HYZAAR ) 50-12.5 MG tablet; Take 1 tablet by mouth daily.  Other cough Dry cough while on ACEi. Cough stopped while ACEi was held. Consistent with ACEi-induced dry cough. Added adverse effect in patients allergy list.      Return in about 8 weeks (around 04/07/2024) for a1c and lipid panel and blood pressure check.    Viktoria King, DO

## 2024-02-21 ENCOUNTER — Ambulatory Visit: Admitting: Neurology

## 2024-02-21 NOTE — Progress Notes (Signed)
Internal Medicine Clinic Attending  I was physically present during the key portions of the resident provided service and participated in the medical decision making of patient's management care. I reviewed pertinent patient test results.  The assessment, diagnosis, and plan were formulated together and I agree with the documentation in the resident's note.  Williams, Julie Anne, MD  

## 2024-03-03 ENCOUNTER — Ambulatory Visit: Admitting: Podiatry

## 2024-04-03 ENCOUNTER — Ambulatory Visit: Admitting: Student

## 2024-04-03 NOTE — Assessment & Plan Note (Deleted)
 OP regimen losartan -HCTZ 50-12.5 mg.

## 2024-04-03 NOTE — Progress Notes (Deleted)
 CC: ***  HPI:  Sherry [name] is a 63 y.o. female living with a history stated below and presents today for follow-up on hyperlipidemia and HbA1c check.   Patient was last seen in resident [facility] on 02/2024   Please see problem based assessment and plan for additional details.  Past Medical History:  Diagnosis Date   Acid reflux disease    Anginal pain    last time 04/18/13 in afternoon; referred to Dr. 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 Swallow whole.     aspirin -acetaminophen -caffeine (EXCEDRIN MIGRAINE) 250-250-65 MG tablet Take 2 tablets by mouth every 6 (six) hours as needed for headache.     busPIRone  (BUSPAR ) 7.5 MG tablet Take 1 tablet (7.5 mg total) by mouth 2 (two) times daily. 60 tablet 3   cetirizine  (ZYRTEC  ALLERGY) 10 MG tablet Take 1 tablet (10 mg total) by mouth daily. 30 tablet 2   cyclobenzaprine  (FLEXERIL ) 10 MG tablet Take 1 tablet (10 mg total) by mouth 2 (two) times daily as needed. 40 tablet 2   diclofenac  Sodium (VOLTAREN ) 1 % GEL Apply 2 grams topically 4 (four) times daily. 100 g 0   docusate sodium  (COLACE) 100 MG capsule Take 1 capsule (100 mg total) by mouth every 12 (twelve) hours. 60 capsule 0   hydrOXYzine  (ATARAX ) 10 MG tablet Take 1 tablet (10 mg total) by mouth 3 (three) times daily as needed. 30 tablet 0   lidocaine  (LIDODERM ) 5 % Place 1 patch  onto the skin daily. Remove & Discard patch within 12 hours or as directed by doctor. 14 patch 0   losartan -hydrochlorothiazide  (HYZAAR ) 50-12.5 MG tablet Take 1 tablet by mouth daily. 90 tablet 3   meloxicam  (MOBIC ) 15 MG tablet Take 1 tablet (15 mg total) by mouth daily. 30 tablet 0   metFORMIN  (GLUCOPHAGE -XR) 500 MG 24 hr tablet Take 1 tablet (500 mg total) by mouth daily with breakfast. 30 tablet 2   methocarbamol  (ROBAXIN ) 500 MG tablet Take 1 tablet (500 mg total) by mouth 2 (two) times daily. 20 tablet 0   metoprolol  tartrate (LOPRESSOR ) 100 MG tablet Take 1 tablet (100 mg total) by mouth once for 1 dose. TAKE TWO HOURS PRIOR TO  SCHEDULE CARDIAC TEST 1 tablet 0   pantoprazole  (PROTONIX ) 40 MG tablet Take 1 tablet (40 mg total) by mouth 2 (two) times daily. 120 tablet 0   polyethylene glycol powder (MIRALAX ) 17 GM/SCOOP powder Take 1 capful (17 g total) and mix in 8 oz of clear liquid and drink by mouth daily as needed for moderate constipation or severe constipation. 510 g 0   promethazine  (PHENERGAN ) 12.5 MG tablet Take 1 tablet (12.5 mg total) by  mouth every 8 (eight) hours as needed for nausea or vomiting. 20 tablet 1   simvastatin  (ZOCOR ) 40 MG tablet Take 1 tablet (40 mg total) by mouth at bedtime. 90 tablet 1   No current facility-administered medications on file prior to visit.    Review of Systems: ROS negative except for what is noted on the assessment and plan.  There were no vitals filed for this visit. {Labs (Optional):23779} {Vitals History (Optional):23777}  Physical Exam: Constitutional: NAD Cardiovascular: RRR, no murmurs. Pulmonary/Chest: Clear bilateral lungs Abdominal: soft, non-tender, non-distended.  Assessment & Plan:   Patient {GC/GE:[id number]::discussed with,seen with} Dr. {WJFZD:[id number]::Tpoopjfd,Z. Hoffman,Chambliss, Winfrey,Lau,Machen}  Assessment & Plan Mixed hyperlipidemia - Check lipid panel and HbA1c.  Essential hypertension OP  regimen losartan -HCTZ 50-12.5 mg.     No orders of the defined types were placed in this encounter.   Missy Sandhoff, MD [facility] Internal Medicine, PGY-2  Date 04/03/2024 Time 8:02 AM

## 2024-04-03 NOTE — Assessment & Plan Note (Deleted)
-   Check lipid panel and HbA1c.

## 2024-04-06 ENCOUNTER — Other Ambulatory Visit (HOSPITAL_COMMUNITY): Payer: Self-pay

## 2024-04-06 ENCOUNTER — Other Ambulatory Visit: Payer: Self-pay

## 2024-04-07 ENCOUNTER — Other Ambulatory Visit (HOSPITAL_COMMUNITY): Payer: Self-pay

## 2024-04-26 ENCOUNTER — Telehealth (HOSPITAL_COMMUNITY): Payer: Self-pay | Admitting: Pharmacy Technician

## 2024-04-26 ENCOUNTER — Other Ambulatory Visit (HOSPITAL_COMMUNITY): Payer: Self-pay

## 2024-04-26 ENCOUNTER — Ambulatory Visit (INDEPENDENT_AMBULATORY_CARE_PROVIDER_SITE_OTHER): Payer: Self-pay | Admitting: Student

## 2024-04-26 VITALS — BP 135/81 | HR 95 | Temp 98.5°F | Ht 61.0 in | Wt 162.8 lb

## 2024-04-26 DIAGNOSIS — E119 Type 2 diabetes mellitus without complications: Secondary | ICD-10-CM | POA: Diagnosis not present

## 2024-04-26 DIAGNOSIS — E782 Mixed hyperlipidemia: Secondary | ICD-10-CM

## 2024-04-26 DIAGNOSIS — I10 Essential (primary) hypertension: Secondary | ICD-10-CM

## 2024-04-26 DIAGNOSIS — Z7985 Long-term (current) use of injectable non-insulin antidiabetic drugs: Secondary | ICD-10-CM

## 2024-04-26 DIAGNOSIS — M797 Fibromyalgia: Secondary | ICD-10-CM

## 2024-04-26 DIAGNOSIS — Z8249 Family history of ischemic heart disease and other diseases of the circulatory system: Secondary | ICD-10-CM

## 2024-04-26 DIAGNOSIS — Z833 Family history of diabetes mellitus: Secondary | ICD-10-CM

## 2024-04-26 DIAGNOSIS — Z79899 Other long term (current) drug therapy: Secondary | ICD-10-CM

## 2024-04-26 LAB — POCT GLYCOSYLATED HEMOGLOBIN (HGB A1C): HbA1c, POC (controlled diabetic range): 6.5 % (ref 0.0–7.0)

## 2024-04-26 LAB — GLUCOSE, CAPILLARY: Glucose-Capillary: 118 mg/dL — ABNORMAL HIGH (ref 70–99)

## 2024-04-26 MED ORDER — LOSARTAN POTASSIUM-HCTZ 100-25 MG PO TABS
1.0000 | ORAL_TABLET | Freq: Every day | ORAL | 11 refills | Status: AC
  Filled 2024-04-26: qty 30, 30d supply, fill #0
  Filled 2024-07-12: qty 30, 30d supply, fill #1

## 2024-04-26 MED ORDER — RYBELSUS 3 MG PO TABS
1.0000 | ORAL_TABLET | Freq: Every day | ORAL | 0 refills | Status: DC
  Filled 2024-04-26 – 2024-04-27 (×2): qty 30, 30d supply, fill #0

## 2024-04-26 NOTE — Patient Instructions (Addendum)
 Dear Ms. Freese,  Thank you for allowing us  to provide your care today. During your visit, we reviewed your blood pressure, cholesterol, fibromyalgia, and several care gaps that are important for your overall health.  As discussed, I am increasing the dose of your blood pressure medication to help improve your blood pressure control. We also rechecked your cholesterol levels today. I am rechecking your hemoglobin A1c as well, since your blood sugars have been running higher. I will contact you as soon as all of your lab results are available.  Please remember to get the shingles vaccine from the pharmacy, as we discussed. Also, please be on the lookout for a call to schedule your mammogram appointment. Thank you again for your time today. If you have any questions or concerns, please do not hesitate to reach out.  I have ordered the following labs for you:  Lab Orders         Lipid Profile         POC Hbg A1C      Tests ordered today:    Referrals ordered today:   Referral Orders         Ambulatory referral to Sports Medicine      I have ordered the following medication/changed the following medications:   Stop the following medications: Medications Discontinued During This Encounter  Medication Reason       metoprolol  tartrate (LOPRESSOR ) 100 MG tablet        losartan -hydrochlorothiazide  (HYZAAR ) 50-12.5 MG tablet      Start the following medications: Meds ordered this encounter  Medications   losartan -hydrochlorothiazide  (HYZAAR ) 100-25 MG tablet    Sig: Take 1 tablet by mouth daily.    Dispense:  30 tablet    Refill:  11     Follow up: 3-4 months    Should you have any questions or concerns please call the internal medicine clinic at [phone].   Drue Lisa Grow MD 04/26/2024, 9:28 AM   [facility]

## 2024-04-26 NOTE — Progress Notes (Deleted)
 CC: Routine follow-up visit  Last office visit was in September 2025  She has no acute concerns today     HPI:  SherrySherry [name] is a 63 y.o. female living with a history stated below and presents today for ***. Please see problem based assessment and plan for additional details.  Past Medical History:  Diagnosis Date   Acid reflux disease    Anginal pain    last time 04/18/13 in afternoon; referred to Dr. 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 Swallow whole.     aspirin -acetaminophen -caffeine (EXCEDRIN MIGRAINE) 250-250-65 MG tablet Take 2 tablets by mouth every 6 (six) hours as needed for headache.     busPIRone  (BUSPAR ) 7.5 MG tablet Take 1 tablet (7.5 mg total) by mouth 2 (two) times daily. 60 tablet 3   cetirizine  (ZYRTEC  ALLERGY) 10 MG tablet Take 1 tablet (10 mg total) by mouth daily. 30 tablet 2   cyclobenzaprine  (FLEXERIL ) 10 MG tablet Take 1 tablet (10 mg total) by mouth 2 (two) times daily as needed. 40 tablet 2   diclofenac  Sodium (VOLTAREN ) 1 % GEL Apply 2 grams topically 4 (four) times daily. 100 g 0   docusate sodium  (COLACE) 100 MG capsule Take 1 capsule (100 mg total) by mouth every 12 (twelve) hours. 60 capsule 0   hydrOXYzine  (ATARAX ) 10 MG tablet Take 1 tablet (10 mg total) by mouth 3 (three) times daily as needed. 30 tablet 0   lidocaine  (LIDODERM ) 5 % Place 1  patch onto the skin daily. Remove & Discard patch within 12 hours or as directed by doctor. 14 patch 0   losartan -hydrochlorothiazide  (HYZAAR ) 50-12.5 MG tablet Take 1 tablet by mouth daily. 90 tablet 3   meloxicam  (MOBIC ) 15 MG tablet Take 1 tablet (15 mg total) by mouth daily. 30 tablet 0   metFORMIN  (GLUCOPHAGE -XR) 500 MG 24 hr tablet Take 1 tablet (500 mg total) by mouth daily with breakfast. 30 tablet 2   methocarbamol  (ROBAXIN ) 500 MG tablet Take 1 tablet (500 mg total) by mouth 2 (two) times daily. 20 tablet 0   metoprolol  tartrate (LOPRESSOR ) 100 MG tablet Take 1 tablet (100 mg total) by mouth once for 1 dose. TAKE TWO HOURS PRIOR TO  SCHEDULE CARDIAC TEST 1 tablet 0   pantoprazole  (PROTONIX ) 40 MG tablet Take 1 tablet (40 mg total) by mouth 2 (two) times daily. 120 tablet 0   polyethylene glycol powder (MIRALAX ) 17 GM/SCOOP powder Take 1 capful (17 g total) and mix in 8 oz of clear liquid and drink by mouth daily as needed for moderate constipation or severe constipation. 510 g 0   promethazine  (PHENERGAN ) 12.5 MG tablet Take 1 tablet (12.5 mg  total) by mouth every 8 (eight) hours as needed for nausea or vomiting. 20 tablet 1   simvastatin  (ZOCOR ) 40 MG tablet Take 1 tablet (40 mg total) by mouth at bedtime. 90 tablet 1   No current facility-administered medications on file prior to visit.    Family History  Problem Relation Age of Onset   Diabetes Mother    Hypertension Mother    Depression Mother    Hypertension Father    Cancer Father        Prostate   Alcohol abuse Father    Diabetes Sister    Cancer Brother        Prostate   Breast cancer Neg Hx    Colon cancer Neg Hx    Colon polyps Neg Hx    Esophageal cancer Neg Hx    Stomach cancer Neg Hx    Rectal cancer Neg Hx     Social History   Socioeconomic History   Marital status: Single    Spouse name: Not on file   Number of children: 2   Years of education: Not on file   Highest education level: 12th grade   Occupational History   Occupation: Unemployed  Tobacco Use   Smoking status: Never   Smokeless tobacco: Never  Vaping Use   Vaping status: Never Used  Substance and Sexual Activity   Alcohol use: No   Drug use: No   Sexual activity: Not on file  Other Topics Concern   Not on file  Social History Narrative   Lives with   2 Children: Ages 68 and 72   4 Grandchildren: 2, 3, 4 and 34   Brother      Diet: Barely eats. Does not exercise due to pain.      03/2011- Currently unemployed. 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 Unsure of last pap.   Unsure of last Td   Unsure of last flu shot   Social Drivers of Corporate Investment Banker Strain: Not on file  Food Insecurity: No Food Insecurity (09/11/2021)   Hunger Vital Sign    Worried About Running Out of Food in the Last Year: Never true    Ran Out of Food in the Last Year: Never true  Transportation Needs: No Transportation Needs (09/11/2021)   PRAPARE - Administrator, Civil Service (Medical): No    Lack of Transportation (Non-Medical): No  Physical Activity: Not on file  Stress: Not on file  Social Connections: Unknown (10/20/2021)   Received from [facility]   Social Network    Social Network: Not on file  Intimate Partner Violence: Unknown (09/10/2021)   Received from [facility]   HITS    Physically Hurt: Not on file    Insult or Talk Down To: Not on file    Threaten Physical Harm: Not on file    Scream or Curse: Not on file    Review of Systems: ROS negative except for what is noted on the assessment and plan.  Vitals:   04/26/24 0833  Weight: 162 lb 12.8 oz (73.8 kg)  Height: 5' 1 (1.549 m)    Physical Exam: Constitutional: well-appearing *** sitting in ***, in no acute distress HENT: normocephalic atraumatic, mucous membranes moist Eyes: conjunctiva non-erythematous Cardiovascular: regular rate and rhythm, no  m/r/g Pulmonary/Chest: normal work of breathing on room air, lungs clear to auscultation bilaterally Abdominal: soft,  non-tender, non-distended MSK: normal bulk and tone Neurological: alert & oriented x 3, no focal deficit Skin: warm and dry Psych: normal mood and behavior  Assessment & Plan:   No problem-specific Assessment & Plan notes found for this encounter.     Patient {GC/GE:[id number]::discussed with,seen with} Dr. {WJFZD:[id number]::Tpoopjfd,Z. Hoffman,Mullen,Narendra,Vincent,Guilloud,Lau,Machen}    Drue Grow, M.D [facility] Internal [facility] Phone: [phone] Date 04/26/2024 Time 8:35 AM

## 2024-04-26 NOTE — Assessment & Plan Note (Signed)
 BP Readings from Last 3 Encounters:  04/26/24 135/81  02/11/24 (!) 165/96  01/04/24 107/74   - Increase Hyzaar  to 100-25mg 

## 2024-04-26 NOTE — Assessment & Plan Note (Signed)
 The patient has a chronic condition and has had multiple referrals sent to PM&R, pain clinic, and sports medicine, but she has been unable to follow up due to insurance issues. She is currently working on obtaining better insurance through her job at the [employer]. In the interim, another referral to sports medicine will be sent, with the hope that her new insurance will allow approval.

## 2024-04-26 NOTE — Assessment & Plan Note (Addendum)
 Lab Results  Component Value Date   HGBA1C 6.5 04/26/2024   HGBA1C 6.3 (H) 01/04/2024   HGBA1C 6.8 (A) 07/19/2023   The patient's hemoglobin A1c appears to be at goal. She reports weakness and muscle aches with metformin , and has therefore not been taking it. Use of a weight-loss GLP-1 has been a concern in the past, but prior attempts were denied by insurance and she was unable to afford out-of-pocket costs. We are considering starting Rybelsus  and will see if insurance approval can be obtained.

## 2024-04-26 NOTE — Progress Notes (Signed)
 CC: Follow up Visit  HPI:  [name] is a 63 y.o. female living with a history stated below and presents today for follow up visit. Please see problem based assessment and plan for additional details.  Past Medical History:  Diagnosis Date   Acid reflux disease    Anginal pain    last time 04/18/13 in afternoon; referred to Dr. 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 Swallow whole.     aspirin -acetaminophen -caffeine (EXCEDRIN MIGRAINE) 250-250-65 MG tablet Take 2 tablets by mouth every 6 (six) hours as needed for headache.     busPIRone  (BUSPAR ) 7.5 MG tablet Take 1 tablet (7.5 mg total) by mouth 2 (two) times daily. 60 tablet 3   cetirizine  (ZYRTEC  ALLERGY) 10 MG tablet Take 1 tablet (10 mg total) by mouth daily. 30 tablet 2   cyclobenzaprine  (FLEXERIL ) 10 MG tablet Take 1 tablet (10 mg total) by mouth 2 (two) times daily as needed. 40 tablet 2   diclofenac  Sodium (VOLTAREN ) 1 % GEL Apply 2 grams topically 4 (four) times daily. 100 g 0   docusate sodium  (COLACE) 100 MG capsule Take 1 capsule (100 mg total) by mouth every 12 (twelve) hours. 60 capsule 0   hydrOXYzine  (ATARAX ) 10 MG tablet Take 1 tablet (10 mg total) by mouth 3 (three) times daily as needed. 30 tablet 0   lidocaine  (LIDODERM ) 5 % Place 1 patch onto the skin daily. Remove & Discard patch within 12 hours or as  directed by doctor. 14 patch 0   meloxicam  (MOBIC ) 15 MG tablet Take 1 tablet (15 mg total) by mouth daily. 30 tablet 0   methocarbamol  (ROBAXIN ) 500 MG tablet Take 1 tablet (500 mg total) by mouth 2 (two) times daily. 20 tablet 0   pantoprazole  (PROTONIX ) 40 MG tablet Take 1 tablet (40 mg total) by mouth 2 (two) times daily. 120 tablet 0   polyethylene glycol powder (MIRALAX ) 17 GM/SCOOP powder Take 1 capful (17 g total) and mix in 8 oz of clear liquid and drink by mouth daily as needed for moderate constipation or severe constipation. 510 g 0   simvastatin  (ZOCOR ) 40 MG tablet Take 1 tablet (40 mg total) by mouth at bedtime. 90 tablet 1   No current facility-administered medications on file prior to visit.    Family History  Problem Relation Age of Onset   Diabetes Mother    Hypertension Mother    Depression Mother    Hypertension Father    Cancer Father        Prostate   Alcohol abuse Father    Diabetes Sister    Cancer Brother        Prostate  Breast cancer Neg Hx    Colon cancer Neg Hx    Colon polyps Neg Hx    Esophageal cancer Neg Hx    Stomach cancer Neg Hx    Rectal cancer Neg Hx     Social History   Socioeconomic History   Marital status: Single    Spouse name: Not on file   Number of children: 2   Years of education: Not on file   Highest education level: 12th grade  Occupational History   Occupation: Unemployed  Tobacco Use   Smoking status: Never   Smokeless tobacco: Never  Vaping Use   Vaping status: Never Used  Substance and Sexual Activity   Alcohol use: No   Drug use: No   Sexual activity: Not on file  Other Topics Concern   Not on file  Social History Narrative   Lives with   2 Children: Ages 92 and 54   4 Grandchildren: 2, 3, 4 and 56   Brother      Diet: Barely eats. Does not exercise due to pain.      03/2011- Currently unemployed. 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  Unsure of last pap.   Unsure of last Td   Unsure of last flu shot   Social Drivers of Corporate Investment Banker Strain: Not on file  Food Insecurity: No Food Insecurity (09/11/2021)   Hunger Vital Sign    Worried About Running Out of Food in the Last Year: Never true    Ran Out of Food in the Last Year: Never true  Transportation Needs: No Transportation Needs (09/11/2021)   PRAPARE - Administrator, Civil Service (Medical): No    Lack of Transportation (Non-Medical): No  Physical Activity: Not on file  Stress: Not on file  Social Connections: Unknown (10/20/2021)   Received from [facility]   Social Network    Social Network: Not on file  Intimate Partner Violence: Unknown (09/10/2021)   Received from [facility]   HITS    Physically Hurt: Not on file    Insult or Talk Down To: Not on file    Threaten Physical Harm: Not on file    Scream or Curse: Not on file    Review of Systems: ROS negative except for what is noted on the assessment and plan.  Vitals:   04/26/24 0833  BP: 135/81  Pulse: 95  Temp: 98.5 F (36.9 C)  TempSrc: Oral  SpO2: 99%  Weight: 162 lb 12.8 oz (73.8 kg)  Height: 5' 1 (1.549 m)    Physical Exam: Constitutional: Tired appearing-appearing woman sitting in chair.  In no acute distress Cardiovascular: regular rate and rhythm, no m/r/g Pulmonary/Chest: normal work of breathing on room air, lungs clear to auscultation bilaterally Skin: warm and dry Psych: normal mood and behavior  Assessment & Plan:   Essential hypertension BP Readings from Last 3 Encounters:  04/26/24 135/81  02/11/24 (!) 165/96  01/04/24 107/74   - Increase Hyzaar  to 100-25mg   Mixed hyperlipidemia Lipid panel in 2023 showed an LDL of 105.  Currently being managed with a low intensity statin simvastatin  40 mg daily.  Check lipid panel today make medication adjustment if clinically indicated. -Check lipid panel  Fibromyalgia The patient has a chronic  condition and has had multiple referrals sent to PM&R, pain clinic, and sports medicine, but she has been unable to  follow up due to insurance issues. She is currently working on obtaining better insurance through her job at the [employer]. In the interim, another referral to sports medicine will be sent, with the hope that her new insurance will allow approval.  Type 2 diabetes mellitus (HCC) Lab Results  Component Value Date   HGBA1C 6.5 04/26/2024   HGBA1C 6.3 (H) 01/04/2024   HGBA1C 6.8 (A) 07/19/2023   The patient's hemoglobin A1c appears to be at goal. She reports weakness and muscle aches with metformin , and has therefore not been taking it. Use of a weight-loss GLP-1 has been a concern in the past, but prior attempts were denied by insurance and she was unable to afford out-of-pocket costs. We are considering starting Rybelsus  and will see if insurance approval can be obtained.     Patient discussed with Dr. Jeanelle Drue Grow, M.D [facility] Phone: [phone] Date 04/26/2024 Time 12:04 PM

## 2024-04-26 NOTE — Progress Notes (Deleted)
 Here for a routine visit   She is sad her insurance didn't approve her referral to a pain clinic for her fibrolamellar , other than that she has no acid concerns   BP is 135/81 . On HYZAAR  50-12 . Increasing the dosing 100-25   T2DM - Last A1c was 6.8 , prescribed metformin  but she is not been taking it  - Repeating A1c - She wants to loose weight ut insurance wont approve Ozempic  for her . We will try Semaglutide  .  HLD- - Repeating Lipid panel today    Fibromyalgia -  Referring to exercise program with sports medicine.  Care gaps: Shingles, she will get it done from here                   : Mammogram, - Referral placed but she said she may have miss the call. I will put their information on the AVS                    : Declined flu vaccine

## 2024-04-26 NOTE — Assessment & Plan Note (Signed)
 Lipid panel in 2023 showed an LDL of 105.  Currently being managed with a low intensity statin simvastatin  40 mg daily.  Check lipid panel today make medication adjustment if clinically indicated. -Check lipid panel

## 2024-04-27 ENCOUNTER — Other Ambulatory Visit (HOSPITAL_COMMUNITY): Payer: Self-pay

## 2024-04-27 ENCOUNTER — Telehealth (HOSPITAL_COMMUNITY): Payer: Self-pay

## 2024-04-27 ENCOUNTER — Ambulatory Visit: Payer: Self-pay | Admitting: Student

## 2024-04-27 LAB — LIPID PANEL
Chol/HDL Ratio: 4.5 ratio — ABNORMAL HIGH (ref 0.0–4.4)
Cholesterol, Total: 202 mg/dL — ABNORMAL HIGH (ref 100–199)
HDL: 45 mg/dL (ref >39)
LDL Chol Calc (NIH): 137 mg/dL — ABNORMAL HIGH (ref 0–99)
Triglycerides: 112 mg/dL (ref 0–149)
VLDL Cholesterol Cal: 20 mg/dL (ref 5–40)

## 2024-04-27 NOTE — Telephone Encounter (Signed)
 Pharmacy Patient Advocate Encounter   Received notification from Pt Calls Messages that prior authorization for Rybelsus  3MG  tablets  is required/requested.   Insurance verification completed.   The patient is insured through [employer].   Per test claim: PA required; PA submitted to above mentioned insurance via Latent Key/confirmation #/EOC [id number] Status is pending

## 2024-04-27 NOTE — Telephone Encounter (Signed)
 Pharmacy Patient Advocate Encounter  Received notification from [employer] that Prior Authorization for Rybelsus  3MG  tablets  has been APPROVED from 04/27/24 to 04/27/25. Ran test claim, Copay is $15. This test claim was processed through [facility]- copay amounts may vary at other pharmacies due to pharmacy/plan contracts, or as the patient moves through the different stages of their insurance plan.   PA #/Case ID/Reference #: [id number]

## 2024-04-27 NOTE — Telephone Encounter (Signed)
 PA request has been Received. New Encounter has been or will be created for follow up. For additional info see Pharmacy Prior Auth telephone encounter from 04/27/24.

## 2024-04-28 NOTE — Progress Notes (Signed)
 Internal Medicine Clinic Attending  Case discussed with the resident at the time of the visit.  We reviewed the resident's history and exam and pertinent patient test results.  I agree with the assessment, diagnosis, and plan of care documented in the resident's note.

## 2024-05-12 ENCOUNTER — Other Ambulatory Visit (HOSPITAL_COMMUNITY): Payer: Self-pay

## 2024-05-12 ENCOUNTER — Other Ambulatory Visit: Payer: Self-pay

## 2024-05-12 ENCOUNTER — Encounter (HOSPITAL_COMMUNITY): Payer: Self-pay

## 2024-05-12 ENCOUNTER — Other Ambulatory Visit: Payer: Self-pay | Admitting: Student

## 2024-05-12 DIAGNOSIS — M797 Fibromyalgia: Secondary | ICD-10-CM

## 2024-05-12 DIAGNOSIS — K219 Gastro-esophageal reflux disease without esophagitis: Secondary | ICD-10-CM

## 2024-05-12 MED ORDER — DICLOFENAC SODIUM 1 % EX GEL
2.0000 g | Freq: Four times a day (QID) | CUTANEOUS | 0 refills | Status: AC
  Filled 2024-05-12: qty 100, 12d supply, fill #0

## 2024-05-15 ENCOUNTER — Other Ambulatory Visit: Payer: Self-pay | Admitting: Student

## 2024-05-15 DIAGNOSIS — M797 Fibromyalgia: Secondary | ICD-10-CM

## 2024-05-16 ENCOUNTER — Other Ambulatory Visit (HOSPITAL_COMMUNITY): Payer: Self-pay

## 2024-05-16 ENCOUNTER — Other Ambulatory Visit: Payer: Self-pay | Admitting: Student

## 2024-05-16 DIAGNOSIS — K219 Gastro-esophageal reflux disease without esophagitis: Secondary | ICD-10-CM

## 2024-05-16 MED ORDER — PANTOPRAZOLE SODIUM 40 MG PO TBEC
40.0000 mg | DELAYED_RELEASE_TABLET | Freq: Two times a day (BID) | ORAL | 0 refills | Status: AC
  Filled 2024-05-16: qty 60, 30d supply, fill #0
  Filled 2024-07-12: qty 60, 30d supply, fill #1

## 2024-05-16 NOTE — Telephone Encounter (Signed)
 Copied from CRM #[id number]. Topic: Clinical - Prescription Issue >> May 16, 2024  8:07 AM Farrel B wrote: Reason for CRM: [id number] pt has called stating she saw the pcp last month and was  pantoprazole  (PROTONIX ) 40 MG tablet [[id number]] pt states that she was told she couldn't get the medication because she had not been to an apt. Please call pt to advise.

## 2024-05-16 NOTE — Telephone Encounter (Signed)
 Medication sent to pharmacy

## 2024-07-12 ENCOUNTER — Other Ambulatory Visit: Payer: Self-pay | Admitting: Student

## 2024-07-12 ENCOUNTER — Other Ambulatory Visit (HOSPITAL_COMMUNITY): Payer: Self-pay

## 2024-07-12 ENCOUNTER — Other Ambulatory Visit: Payer: Self-pay

## 2024-07-12 ENCOUNTER — Telehealth (HOSPITAL_COMMUNITY): Payer: Self-pay

## 2024-07-12 ENCOUNTER — Other Ambulatory Visit (HOSPITAL_BASED_OUTPATIENT_CLINIC_OR_DEPARTMENT_OTHER): Payer: Self-pay

## 2024-07-12 ENCOUNTER — Encounter (HOSPITAL_COMMUNITY): Payer: Self-pay

## 2024-07-12 DIAGNOSIS — M797 Fibromyalgia: Secondary | ICD-10-CM

## 2024-07-12 DIAGNOSIS — J302 Other seasonal allergic rhinitis: Secondary | ICD-10-CM

## 2024-07-12 DIAGNOSIS — E11649 Type 2 diabetes mellitus with hypoglycemia without coma: Secondary | ICD-10-CM

## 2024-07-12 DIAGNOSIS — E782 Mixed hyperlipidemia: Secondary | ICD-10-CM

## 2024-07-12 DIAGNOSIS — E119 Type 2 diabetes mellitus without complications: Secondary | ICD-10-CM

## 2024-07-12 MED ORDER — RYBELSUS 3 MG PO TABS
1.0000 | ORAL_TABLET | Freq: Every day | ORAL | 1 refills | Status: AC
  Filled 2024-07-12 – 2024-07-14 (×2): qty 30, 30d supply, fill #0

## 2024-07-12 NOTE — Telephone Encounter (Signed)
 Medication discontinued 04/26/24.

## 2024-07-12 NOTE — Telephone Encounter (Signed)
 Pharmacy Patient Advocate Encounter   Received notification from Pt Calls Messages that prior authorization for Rybelsus  3MG  tablets  is required/requested.   Insurance verification completed.   The patient is insured through [facility].   Per test claim: PA required; PA submitted to above mentioned insurance via Latent Key/confirmation #/EOC [id number] Status is pending

## 2024-07-12 NOTE — Telephone Encounter (Signed)
 PA request has been Received. New Encounter has been or will be created for follow up. For additional info see Pharmacy Prior Auth telephone encounter from 07/12/24.

## 2024-07-12 NOTE — Telephone Encounter (Signed)
 Medication sent to pharmacy

## 2024-07-14 ENCOUNTER — Other Ambulatory Visit (HOSPITAL_COMMUNITY): Payer: Self-pay

## 2024-07-14 MED ORDER — CYCLOBENZAPRINE HCL 10 MG PO TABS
10.0000 mg | ORAL_TABLET | Freq: Two times a day (BID) | ORAL | 2 refills | Status: AC | PRN
  Filled 2024-07-14: qty 40, 20d supply, fill #0

## 2024-07-14 MED ORDER — CETIRIZINE HCL 10 MG PO TABS
10.0000 mg | ORAL_TABLET | Freq: Every day | ORAL | 2 refills | Status: AC
  Filled 2024-07-14: qty 90, 90d supply, fill #0

## 2024-07-14 MED ORDER — DOCUSATE SODIUM 100 MG PO CAPS
100.0000 mg | ORAL_CAPSULE | Freq: Two times a day (BID) | ORAL | 0 refills | Status: AC
  Filled 2024-07-14: qty 60, 30d supply, fill #0

## 2024-07-14 MED ORDER — SIMVASTATIN 40 MG PO TABS
40.0000 mg | ORAL_TABLET | Freq: Every day | ORAL | 1 refills | Status: AC
  Filled 2024-07-14: qty 90, 90d supply, fill #0

## 2024-07-14 NOTE — Telephone Encounter (Signed)
 Pharmacy Patient Advocate Encounter  Received notification from [facility] that Prior Authorization for  Rybelsus  3MG  tablets   has been APPROVED from 07/13/24 to 07/12/25. Ran test claim, Copay is $482.60 with copay card. This test claim was processed through [facility]- copay amounts may vary at other pharmacies due to pharmacy/plan contracts, or as the patient moves through the different stages of their insurance plan.   PA #/Case ID/Reference #: [id number]
